# Patient Record
Sex: Female | Born: 1955 | Race: White | Hispanic: No | Smoking: Former smoker
Health system: Southern US, Community
[De-identification: ages and names within clinical notes are randomized; demographics above are authoritative.]

## PROBLEM LIST (undated history)

## (undated) VITALS — BP 96/73 | HR 140 | Temp 97.6°F | Resp 20 | Ht 64.0 in | Wt 192.0 lb

## (undated) DIAGNOSIS — F32A Depression, unspecified: Secondary | ICD-10-CM

## (undated) DIAGNOSIS — K589 Irritable bowel syndrome without diarrhea: Secondary | ICD-10-CM

## (undated) DIAGNOSIS — E7800 Pure hypercholesterolemia, unspecified: Secondary | ICD-10-CM

## (undated) DIAGNOSIS — K219 Gastro-esophageal reflux disease without esophagitis: Secondary | ICD-10-CM

## (undated) DIAGNOSIS — Z9889 Other specified postprocedural states: Secondary | ICD-10-CM

## (undated) DIAGNOSIS — R112 Nausea with vomiting, unspecified: Secondary | ICD-10-CM

## (undated) DIAGNOSIS — F329 Major depressive disorder, single episode, unspecified: Secondary | ICD-10-CM

## (undated) DIAGNOSIS — C50919 Malignant neoplasm of unspecified site of unspecified female breast: Secondary | ICD-10-CM

## (undated) DIAGNOSIS — F419 Anxiety disorder, unspecified: Secondary | ICD-10-CM

## (undated) DIAGNOSIS — I10 Essential (primary) hypertension: Secondary | ICD-10-CM

## (undated) DIAGNOSIS — K449 Diaphragmatic hernia without obstruction or gangrene: Secondary | ICD-10-CM

## (undated) DIAGNOSIS — C50911 Malignant neoplasm of unspecified site of right female breast: Secondary | ICD-10-CM

## (undated) HISTORY — PX: TONSILLECTOMY: SUR1361

## (undated) HISTORY — PX: ABDOMINAL HYSTERECTOMY: SHX81

## (undated) HISTORY — PX: MASTECTOMY: SHX3

## (undated) HISTORY — PX: BREAST SURGERY: SHX581

## (undated) HISTORY — PX: TUBAL LIGATION: SHX77

## (undated) HISTORY — PX: TOTAL ABDOMINAL HYSTERECTOMY W/ BILATERAL SALPINGOOPHORECTOMY: SHX83

## (undated) HISTORY — PX: CHOLECYSTECTOMY: SHX55

## (undated) HISTORY — PX: FOOT SURGERY: SHX648

---

## 2012-01-26 ENCOUNTER — Encounter: Payer: BC Managed Care – PPO | Admitting: Internal Medicine

## 2012-01-26 DIAGNOSIS — K589 Irritable bowel syndrome without diarrhea: Secondary | ICD-10-CM

## 2012-01-26 DIAGNOSIS — F411 Generalized anxiety disorder: Secondary | ICD-10-CM

## 2012-01-26 DIAGNOSIS — C50919 Malignant neoplasm of unspecified site of unspecified female breast: Secondary | ICD-10-CM

## 2012-02-03 ENCOUNTER — Encounter (INDEPENDENT_AMBULATORY_CARE_PROVIDER_SITE_OTHER): Payer: Self-pay | Admitting: *Deleted

## 2012-03-08 ENCOUNTER — Ambulatory Visit (INDEPENDENT_AMBULATORY_CARE_PROVIDER_SITE_OTHER): Payer: BC Managed Care – PPO | Admitting: Internal Medicine

## 2012-07-08 ENCOUNTER — Encounter: Payer: BC Managed Care – PPO | Admitting: Internal Medicine

## 2012-08-29 ENCOUNTER — Inpatient Hospital Stay (HOSPITAL_COMMUNITY)
Admission: RE | Admit: 2012-08-29 | Discharge: 2012-09-02 | DRG: 430 | Disposition: A | Discharge status: Home / Self Care | Payer: BC Managed Care – PPO | Attending: Psychiatry | Admitting: Psychiatry

## 2012-08-29 ENCOUNTER — Encounter (HOSPITAL_COMMUNITY): Payer: Self-pay | Admitting: *Deleted

## 2012-08-29 DIAGNOSIS — F329 Major depressive disorder, single episode, unspecified: Secondary | ICD-10-CM

## 2012-08-29 DIAGNOSIS — F419 Anxiety disorder, unspecified: Secondary | ICD-10-CM

## 2012-08-29 DIAGNOSIS — F32A Depression, unspecified: Secondary | ICD-10-CM | POA: Diagnosis present

## 2012-08-29 DIAGNOSIS — F411 Generalized anxiety disorder: Secondary | ICD-10-CM | POA: Diagnosis present

## 2012-08-29 DIAGNOSIS — F332 Major depressive disorder, recurrent severe without psychotic features: Principal | ICD-10-CM | POA: Diagnosis present

## 2012-08-29 DIAGNOSIS — I10 Essential (primary) hypertension: Secondary | ICD-10-CM | POA: Diagnosis present

## 2012-08-29 DIAGNOSIS — Z79899 Other long term (current) drug therapy: Secondary | ICD-10-CM

## 2012-08-29 HISTORY — DX: Irritable bowel syndrome, unspecified: K58.9

## 2012-08-29 HISTORY — DX: Anxiety disorder, unspecified: F41.9

## 2012-08-29 HISTORY — DX: Depression, unspecified: F32.A

## 2012-08-29 HISTORY — DX: Malignant neoplasm of unspecified site of unspecified female breast: C50.919

## 2012-08-29 HISTORY — DX: Essential (primary) hypertension: I10

## 2012-08-29 HISTORY — DX: Major depressive disorder, single episode, unspecified: F32.9

## 2012-08-29 HISTORY — DX: Gastro-esophageal reflux disease without esophagitis: K21.9

## 2012-08-29 HISTORY — DX: Diaphragmatic hernia without obstruction or gangrene: K44.9

## 2012-08-29 HISTORY — DX: Malignant neoplasm of unspecified site of right female breast: C50.911

## 2012-08-29 MED ORDER — ALUM & MAG HYDROXIDE-SIMETH 200-200-20 MG/5ML PO SUSP: 30.0000 mL | ORAL | Status: DC | PRN

## 2012-08-29 MED ORDER — EZETIMIBE 10 MG PO TABS
10.0000 mg | ORAL_TABLET | Freq: Every day | ORAL | Status: DC
  Administered 2012-08-30 – 2012-09-02 (×4): 10 mg via ORAL
  Filled 2012-08-29 (×7): qty 1

## 2012-08-29 MED ORDER — ALPRAZOLAM 1 MG PO TABS
1.0000 mg | ORAL_TABLET | Freq: Four times a day (QID) | ORAL | Status: DC
  Administered 2012-08-29 – 2012-09-02 (×14): 1 mg via ORAL
  Filled 2012-08-29 (×14): qty 1

## 2012-08-29 MED ORDER — AMLODIPINE BESYLATE 2.5 MG PO TABS
2.5000 mg | ORAL_TABLET | Freq: Every day | ORAL | Status: DC
  Filled 2012-08-29 (×2): qty 1

## 2012-08-29 MED ORDER — SERTRALINE HCL 100 MG PO TABS
200.0000 mg | ORAL_TABLET | Freq: Every day | ORAL | Status: DC
  Administered 2012-08-30 – 2012-09-02 (×4): 200 mg via ORAL
  Filled 2012-08-29 (×6): qty 2

## 2012-08-29 MED ORDER — MAGNESIUM HYDROXIDE 400 MG/5ML PO SUSP: 30.0000 mL | Freq: Every day | ORAL | Status: DC | PRN

## 2012-08-29 MED ORDER — GABAPENTIN 300 MG PO CAPS
300.0000 mg | ORAL_CAPSULE | Freq: Three times a day (TID) | ORAL | Status: DC
  Administered 2012-08-30 (×2): 300 mg via ORAL
  Filled 2012-08-29 (×18): qty 1

## 2012-08-29 MED ORDER — TRAZODONE HCL 50 MG PO TABS
50.0000 mg | ORAL_TABLET | Freq: Every evening | ORAL | Status: DC | PRN
  Administered 2012-08-29: 50 mg via ORAL
  Filled 2012-08-29: qty 1

## 2012-08-29 MED ORDER — BENAZEPRIL HCL 10 MG PO TABS
10.0000 mg | ORAL_TABLET | Freq: Every day | ORAL | Status: DC
  Filled 2012-08-29 (×2): qty 1

## 2012-08-29 MED ORDER — ANASTROZOLE 1 MG PO TABS
1.0000 mg | ORAL_TABLET | Freq: Every day | ORAL | Status: DC
  Administered 2012-08-30 – 2012-09-02 (×4): 1 mg via ORAL
  Filled 2012-08-29 (×6): qty 1

## 2012-08-29 MED ORDER — ACETAMINOPHEN 325 MG PO TABS
650.0000 mg | ORAL_TABLET | Freq: Four times a day (QID) | ORAL | Status: DC | PRN
  Administered 2012-09-01: 650 mg via ORAL

## 2012-08-29 NOTE — Progress Notes (Signed)
NSG Admission Note: 57 year old female who presents with SI with a plan to OD on pill, depression and anxiety.  Patient states it was not her intention to say that she was having suicidal thoughts.  Patient states she was overwhelmed with many family issues but states shdoes not want to kill herself.  Patient states she has battled depression and anxiety.  Patient states the bulk of her stress comes form her children.  Patient states she has a daughter is uses battles with substance abuse and patient states her daughter lost custody of her children.  Patient states she has a son who recently found out his wife is having an affair and patient's son is living with her.  Patient states her son is drinking alcohol and patient states she constantly worries about him fearful that he will take his life accidentally.  Patient currently denies SI.HI and denies AVH.  Patient states she wants  to go to group therapy with people like her who are having problems with their children.  Patient states her medical history is anxiety, depression, breast ca, and hypertension.  Surgical history: mastectomy with reconstruction.  Patient calm, cooperative and appropriate on the unit.  Consents and skin search done by [employer].

## 2012-08-29 NOTE — Tx Team (Signed)
Initial Interdisciplinary Treatment Plan  PATIENT STRENGTHS: (choose at least two) Ability for insight Average or above average intelligence Capable of independent living Communication skills General fund of knowledge Motivation for treatment/growth Supportive family/friends  PATIENT STRESSORS: Marital or family conflict   PROBLEM LIST: Problem List/Patient Goals Date to be addressed Date deferred Reason deferred Estimated date of resolution  Depression 08/29/2012     Anxiety 08/29/2012     Suicidal Thoughts 08/29/2012                                          DISCHARGE CRITERIA:  Ability to meet basic life and health needs Improved stabilization in mood, thinking, and/or behavior Medical problems require only outpatient monitoring Safe-care adequate arrangements made  PRELIMINARY DISCHARGE PLAN: Attend aftercare/continuing care group Participate in family therapy Return to previous living arrangement Return to previous work or school arrangements  PATIENT/FAMIILY INVOLVEMENT: This treatment plan has been presented to and reviewed with the patient, Claudia [name].  The patient and family have been given the opportunity to ask questions and make suggestions.  Angeline Slim M 08/29/2012, 8:07 PM
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
## 2012-08-30 DIAGNOSIS — F332 Major depressive disorder, recurrent severe without psychotic features: Principal | ICD-10-CM

## 2012-08-30 MED ORDER — BENAZEPRIL HCL 10 MG PO TABS
10.0000 mg | ORAL_TABLET | Freq: Every day | ORAL | Status: DC
  Administered 2012-08-30 – 2012-09-01 (×3): 10 mg via ORAL
  Filled 2012-08-30 (×5): qty 1

## 2012-08-30 MED ORDER — AMLODIPINE BESYLATE 2.5 MG PO TABS
2.5000 mg | ORAL_TABLET | Freq: Every day | ORAL | Status: DC
  Administered 2012-08-30: 2.5 mg via ORAL
  Administered 2012-08-31 – 2012-09-01 (×2): via ORAL
  Filled 2012-08-30 (×5): qty 1

## 2012-08-30 MED ORDER — ALPRAZOLAM 1 MG PO TABS
1.0000 mg | ORAL_TABLET | Freq: Once | ORAL | Status: AC
  Administered 2012-08-30: 1 mg via ORAL
  Filled 2012-08-30: qty 1

## 2012-08-30 NOTE — Progress Notes (Signed)
Adult Psychoeducational Group Note  Date:  08/30/2012 Time:  5:24 PM  Group Topic/Focus:  Recovery Goals:   The focus of this group is to identify appropriate goals for recovery and establish a plan to achieve them.  Participation Level:  Minimal  Participation Quality:  Attentive  Affect:  Appropriate  Cognitive:  Appropriate  Insight: Good  Engagement in Group:  Developing/Improving  Modes of Intervention:  Education, Dentist and Support  Additional Comments:  Claudia [name] attended group, and participated. She gave positive feedback with her situation. Patient discussed in group the definition of recovery. She completed workbook on writing two changes that were needed to be made in her life and how she could change them to stay on path for recovery. Patient explained her goals for recovery. Patient stated she wanted to focus on caring more for her needs and then her children. Claudia [name] stated she felt like she gives more time to them to than herself, always there for them when needed.   Karleen Hampshire Brittini 08/30/2012, 5:24 PM

## 2012-08-30 NOTE — Progress Notes (Signed)
D: Patient calm and cooperative.  Patient states she feels depressed.  Patient visible on the unit not interacting with peers.  Patient states she feels anxiety.  Patient denies SI/HI and patient denies AVH. A: Staff to monitor Q 15 mins for safety.  Encouragement and support offered.  Scheduled medications administered per orders.  Patient refused Neurontin because patient states she wants to talk to her doctor before taking it.  Patient refused Zetia because patient states she had already taken her daily dose for tonight.  Trazodone administered prn for sleep. R: Patient remains safe on the unit.  Patient went into group late because her admission had to be finished.  Patient taking administered medications.

## 2012-08-30 NOTE — Progress Notes (Signed)
Patient ID: Claudia [name], female   DOB: 1955-09-21, 57 y.o.   MRN: [id number] D- Patient reports poor sleep and good appetite.  Her energy level is normal and her ability to pay attention is good.  She rates her depression and her anxiety at 4.  She denies thougths of self harm.  She says she needs to "try to separate my happiness from my children's happiness."  A- Encouraged pt to take advantage of opportunity to take care of herself and focus on herself while here.  R- Patient says she will.

## 2012-08-30 NOTE — Progress Notes (Signed)
D - Patient active on the unit today, interacting with peers appropriately. Patient stated that the trazodone made her feel "drugged, dizzy, and weird feeling" and does not want to take it anymore. Patient stated she would prefer to take 2 mg Xanax at night. Patient refused neurontin today stating she is concerned it may be making her feel weird.  A - A - Patient offered encouragement and support through therapeutic conversation. Encouraged patient to speak with staff about any concerns or questions. Medications given as ordered.  R - Patient safety maintained with Q 15 minute checks. Patient remains safe on the unit.

## 2012-08-30 NOTE — Progress Notes (Signed)
Grief and Loss Group  Group discussed significant losses in life and reactions to losses.  Group also explored emotions, coping skills for loss, and limits of control.  Pt discussed the death of her uncle and processing loss through expression of emotions and the use of humor.  Pt discussed taking on the role of a caretaker and recognizing that pt must care for herself as opposed to feeling the pressure to care for others at her expense.  Whitney P Akers Counseling Intern [employer]
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
## 2012-08-30 NOTE — Progress Notes (Signed)
Kellie Shropshire, PA notified of patient request for 2 mg xanax tonight instead of taking trazodone, new order received.
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
## 2012-08-30 NOTE — Progress Notes (Signed)
[facility] LCSW Aftercare Discharge Planning Group Note  08/30/2012 10:52 AM  Participation Quality:  Appropriate and Attentive  Affect:  Appropriate  Cognitive:  Alert and Appropriate  Insight:  Engaged  Engagement in Group:  Engaged  Modes of Intervention:  Exploration, Problem-solving, Rapport Building and Support  Summary of Progress/Problems:  Patient advised of admitting to the hospital due to concerns regarding her adult children.  Patient shared she made a suicidal statement but had no intent of acting on thought.  She denies SI/HI today and rates depression and anxiety at four. She reports having home and outpatient therapy.  She is followed for medications by her PCP.     Wynn Banker 08/30/2012, 10:52 AM

## 2012-08-30 NOTE — [facility] Suicide Risk Assessment (Signed)
Suicide Risk Assessment  Admission Assessment     Nursing information obtained from:  Patient Demographic factors:  Caucasian;Divorced or widowed Current Mental Status:  Suicidal ideation indicated by patient Loss Factors:  NA Historical Factors:  Prior suicide attempts (at 57 year old) Risk Reduction Factors:  Sense of responsibility to family;Positive social support;Positive therapeutic relationship;Religious beliefs about death;Employed  CLINICAL FACTORS:   Severe Anxiety and/or Agitation Depression:   Anhedonia Hopelessness Insomnia Severe  COGNITIVE FEATURES THAT CONTRIBUTE TO RISK:  Cognitively intact  SUICIDE RISK:   Mild:  Suicidal ideation of limited frequency, intensity, duration, and specificity.  There are no identifiable plans, no associated intent, mild dysphoria and related symptoms, good self-control (both objective and subjective assessment), few other risk factors, and identifiable protective factors, including available and accessible social support.  PLAN OF CARE: Start medications as needed. Plan for discharge when stable.  I certify that inpatient services furnished can reasonably be expected to improve the patient's condition.  Kasidi Shanker 08/30/2012, 10:42 AM

## 2012-08-30 NOTE — Progress Notes (Signed)
[facility] LCSW Group Therapy      Feelings about Diagnosis 1:15 - 2:30    08/30/2012 3:27 PM  Type of Therapy:  Group Therapy  Participation Level:  Active  Participation Quality:  Appropriate and Attentive  Affect:  Appropriate  Cognitive:  Appropriate  Insight:  Engaged  Engagement in Therapy:  Engaged  Modes of Intervention:  Discussion, Exploration, Orientation, Rapport Building and Support  Summary of Progress/Problems:  Patient shared she struggles not with a diagnosis but with condoning her children even when she knows they are wrong.  Patient stated she is obsessed with children and does not know how to separate her life from theirs.  Patient was informed when she supports her children in negative behaviors she becomes an Scientist, forensic and that does not benefit her or her children.  Wynn Banker 08/30/2012, 3:27 PM

## 2012-08-31 DIAGNOSIS — F32A Depression, unspecified: Secondary | ICD-10-CM | POA: Diagnosis present

## 2012-08-31 DIAGNOSIS — F329 Major depressive disorder, single episode, unspecified: Secondary | ICD-10-CM | POA: Diagnosis present

## 2012-08-31 DIAGNOSIS — F419 Anxiety disorder, unspecified: Secondary | ICD-10-CM | POA: Diagnosis present

## 2012-08-31 MED ORDER — ALPRAZOLAM 1 MG PO TABS
2.0000 mg | ORAL_TABLET | Freq: Once | ORAL | Status: AC
  Administered 2012-08-31: 2 mg via ORAL
  Filled 2012-08-31: qty 2

## 2012-08-31 NOTE — Progress Notes (Signed)
[facility] LCSW Aftercare Discharge Planning Group Note  08/31/2012 12:43 PM  Participation Quality:  Appropriate and Attentive  Affect:  Appropriate  Cognitive:  Alert and Appropriate  Insight:  Engaged  Engagement in Group:  Engaged  Modes of Intervention:  Exploration, Problem-solving, Rapport Building and Support  Summary of Progress/Problems:  Patient reports being better today and rates symptoms at two/three.  Patient continued to be focused on adult son.  Writer redirected patient to focus on herself and her needs. Daily workbook provided.  Wynn Banker 08/31/2012, 12:43 PM

## 2012-08-31 NOTE — Progress Notes (Signed)
Adult Psychoeducational Group Note  Date:  08/31/2012 Time:  3:00 PM  Group Topic/Focus:  Personal Choices and Values:   The focus of this group is to help patients assess and explore the importance of values in their lives, how their values affect their decisions, how they express their values and what opposes their expression.  Participation Level:  Minimal  Participation Quality:  Appropriate and Attentive  Affect:  Appropriate  Cognitive:  Appropriate  Insight: Good and Improving  Engagement in Group:  Developing/Improving  Modes of Intervention:  Discussion, Education, Limit-setting, Problem-solving and Support  Additional Comments:  Amberley attended group and shared when asked to speak. Patient was drowsy during group. Patient did stated what were some of her negative and positive choices she has made throughout her life span. Patient also completed her worksheets in her workbook on identifying values and choosing a value oriented life.   Karleen Hampshire Brittini 08/31/2012, 3:00 PM

## 2012-08-31 NOTE — Tx Team (Signed)
Interdisciplinary Treatment Plan Update (Adult)  Date:  08/31/2012  Time Reviewed:  10:36 AM   Progress in Treatment: Attending groups:   Yes   Participating in groups:  Yes Taking medication as prescribed:  Yes Tolerating medication:  Yes Family/Significant othe contact made: Contact to be made with family Patient understands diagnosis:  Yes Discussing patient identified problems/goals with staff: Yes Medical problems stabilized or resolved: Yes Denies suicidal/homicidal ideation:Yes Issues/concerns per patient self-inventory:  Other:   New problem(s) identified:  Reason for Continuation of Hospitalization: Anxiety Depression Medication stabilization   Interventions implemented related to continuation of hospitalization:  Medication Management; safety checks q 15 mins  Additional comments:  Estimated length of stay:  2-3 days  Discharge Plan:  Home with outpatient follow up  New goal(s):  Review of initial/current patient goals per problem list:    1.  Goal(s): Eliminate SI/other thoughts of self harm (Patient will no longer endorse SI/HI or thoughts of self harm)   Met:  Yes  Target date: d/c  As evidenced by: Patient no longer endorses SI/HI or other thoughts of self harm.    2.  Goal (s):Reduce depression/anxiety (Paitent will rate symptoms at four or below)  Met: Yes  Target date: d/c  As evidenced by: Patient rates symptoms at two/three today    3.  Goal(s):.stabilize on meds (Patient will report being stabilized on medications - less symptomatic   Met:  No  Target date: d/c  As evidenced by: Patient reports being better today    4.  Goal(s): Refer for outpatient follow up (Follow up appointment will be scheduled)   Met:  Yes  Target date: d/c  As evidenced by: Follow up appointment not scheduled    Attendees: Patient:   08/31/2012 10:36 AM  Physican:  Patrick North, MD 08/31/2012 10:36 AM  Nursing:    Chinita Greenland, RN 08/31/2012  10:36 AM   Nursing:    Harold Barban, RN 08/31/2012 10:36 AM   Clinical Social Worker:  Juline Patch, LCSW 08/31/2012 10:36 AM   Other: Tera Helper, PHM-NP 08/31/2012 10:36 AM   Other:   08/31/2012 10:36 AM Other:        08/31/2012 10:36 AM

## 2012-08-31 NOTE — Progress Notes (Signed)
[facility] Group Notes:  (Nursing/MHT/Case Management/Adjunct)  Type of Therapy:  Psychoeducational Skills  Participation Level:  Active  Participation Quality:  Appropriate, Attentive and Sharing  Affect:  Blunted  Cognitive:  Appropriate and Oriented  Insight:  Good  Engagement in Group:  Engaged  Modes of Intervention:  Activity, Discussion, Education, Problem-solving, Rapport Building, Socialization and Support  Summary of Progress/Problems: Claudia [name] attended psychoeducational group on labels. 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 Claudia [name] was quiet but attentive and spoke when called on while group discussed what labels are, how we use them, how they affect the way we think about and perceive the world, and listed positive and negative labels they have used or been called. Claudia [name] was given homework assignment to list 10 words he has been labeled and to find the reality of the situation/label.    Wandra Scot 08/31/2012 12:47 PM

## 2012-08-31 NOTE — Progress Notes (Signed)
Patient ID: Claudia [name], female   DOB: [birth year], 56 y.o.   MRN: [id number] Patient currently asleep; no s/s of distress noted at this time.

## 2012-08-31 NOTE — Progress Notes (Signed)
Nutrition Brief Note  Pt meets criteria for severe MALNUTRITION in the context of social/environmental circumstances as evidenced by <75% estimated energy intake with 13.5% weight loss in the past 5 months per pt report.  Patient identified on the Malnutrition Screening Tool (MST) Report  Body mass index is 32.96 kg/(m^2). Patient meets criteria for class I obesity based on current BMI.   Pt reports 30 pound unintended weight loss in the past 5 months from stress, depression, and anxiety. Pt reports she works 3rd shift and her daily meal intake may consist of a cookie or a sandwich or she would skip meals or days and not eat. Pt reports her intake severely declined in the past few weeks. Pt reports her appetite is currently excellent and denies needing any nutritional supplements at this time. Encouraged pt to continue to eat well at discharge.   No nutrition interventions warranted at this time. If nutrition issues arise, please consult RD.   Levon Hedger MS, RD, LDN [phone] Pager [phone] After Hours Pager

## 2012-08-31 NOTE — Progress Notes (Signed)
[facility] LCSW Group Therapy      Feelings about Diagnosis 1:15 - 2:30    08/31/2012 12:45 PM  Type of Therapy:  Group Therapy  Participation Level:  Active  Participation Quality:  Appropriate and Attentive  Affect:  Appropriate  Cognitive:  Appropriate  Insight:  Engaged  Engagement in Therapy:  Engaged  Modes of Intervention:  Discussion, Exploration, Orientation, Rapport Building and Support  Summary of Progress/Problems:  Patient shared the exercise on mindfulness was very helpful.  She stated she does not know how to apply it to work in her problem with relationship with her children.  Patient was encourage to insist her daughter respects her when they talk by ending conversation if daughter is cursing her.    Wynn Banker 08/31/2012, 12:45 PM

## 2012-08-31 NOTE — Progress Notes (Signed)
[facility] MD Progress Note  08/31/2012 3:43 PM Claudia [name]  MRN:  [id number] Subjective:  Claudia [name] reports "good" sleep but feels "sluggish" today, appetite "extremely good", 2/10 depression and anxiety, denies suicidal ideations, arthritis aches and pains in her lower back and knees but reports it is controlled, pleasant and engaging. Diagnosis:   Axis I: Anxiety Disorder NOS and Depressive Disorder NOS Axis II: Deferred Axis III:  Past Medical History  Diagnosis Date  . Anxiety   . Depression   . Hypertension   . Breast cancer   . GERD (gastroesophageal reflux disease)   . Cancer of right breast     mastectomy  . Irritable bowel   . Hiatal hernia    Axis IV: other psychosocial or environmental problems, problems related to social environment and problems with primary support group Axis V: 41-50 serious symptoms  ADL's:  Intact  Sleep: Good  Appetite:  Good  Suicidal Ideation:  Denies Homicidal Ideation:  Denies  Psychiatric Specialty Exam: Review of Systems  Constitutional: Negative.   Eyes: Negative.   Respiratory: Negative.   Cardiovascular: Negative.   Gastrointestinal: Negative.   Genitourinary: Negative.   Musculoskeletal: Negative.   Skin: Negative.   Neurological: Negative.   Endo/Heme/Allergies: Negative.   Psychiatric/Behavioral: Positive for depression. The patient is nervous/anxious.     Blood pressure 122/72, pulse 93, temperature 97.8 F (36.6 C), temperature source Oral, resp. rate 18, height 5\' 4"  (1.626 m), weight 87.091 kg (192 lb), SpO2 98.00%.Body mass index is 32.96 kg/(m^2).  General Appearance: Casual  Eye Contact::  Fair  Speech:  Normal Rate  Volume:  Normal  Mood:  Anxious and Depressed  Affect:  Congruent  Thought Process:  Coherent  Orientation:  Full (Time, Place, and Person)  Thought Content:  WDL  Suicidal Thoughts:  No  Homicidal Thoughts:  No  Memory:  Immediate;   Fair Recent;   Fair Remote;   Fair  Judgement:  Fair   Insight:  Fair  Psychomotor Activity:  Decreased  Concentration:  Fair  Recall:  Fair  Akathisia:  No  Handed:  Right  AIMS (if indicated):     Assets:  Communication Skills Desire for Improvement Resilience  Sleep:  Number of Hours: 5    Current Medications: Current Facility-Administered Medications  Medication Dose Route Frequency Provider Last Rate Last Dose  . acetaminophen (TYLENOL) tablet 650 mg  650 mg Oral Q6H PRN Rachael Fee, MD      . ALPRAZolam Prudy Feeler) tablet 1 mg  1 mg Oral QID Rachael Fee, MD   1 mg at 08/31/12 1213  . alum & mag hydroxide-simeth (MAALOX/MYLANTA) 200-200-20 MG/5ML suspension 30 mL  30 mL Oral Q4H PRN Rachael Fee, MD      . amLODipine (NORVASC) tablet 2.5 mg  2.5 mg Oral Daily Nanine Means, NP   2.5 mg at 08/30/12 2144  . anastrozole (ARIMIDEX) tablet 1 mg  1 mg Oral Daily Rachael Fee, MD   1 mg at 08/31/12 0815  . benazepril (LOTENSIN) tablet 10 mg  10 mg Oral Daily Nanine Means, NP   10 mg at 08/30/12 2144  . ezetimibe (ZETIA) tablet 10 mg  10 mg Oral Daily Rachael Fee, MD   10 mg at 08/31/12 4098  . gabapentin (NEURONTIN) capsule 300 mg  300 mg Oral TID Rachael Fee, MD   300 mg at 08/30/12 1151  . magnesium hydroxide (MILK OF MAGNESIA) suspension 30 mL  30 mL Oral Daily  PRN Rachael Fee, MD      . sertraline (ZOLOFT) tablet 200 mg  200 mg Oral Daily Rachael Fee, MD   200 mg at 08/31/12 1610  . traZODone (DESYREL) tablet 50 mg  50 mg Oral QHS PRN,MR X 1 Verne Spurr, PA-C   50 mg at 08/29/12 2256    Lab Results: No results found for this or any previous visit (from the past 48 hour(s)).  Physical Findings: AIMS: Facial and Oral Movements Muscles of Facial Expression: None, normal Lips and Perioral Area: None, normal Jaw: None, normal Tongue: None, normal,Extremity Movements Upper (arms, wrists, hands, fingers): None, normal Lower (legs, knees, ankles, toes): None, normal, Trunk Movements Neck, shoulders, hips: None, normal,  Overall Severity Severity of abnormal movements (highest score from questions above): None, normal Incapacitation due to abnormal movements: None, normal Patient's awareness of abnormal movements (rate only patient's report): No Awareness, Dental Status Current problems with teeth and/or dentures?: No Does patient usually wear dentures?: No  CIWA:    COWS:     Treatment Plan Summary: Daily contact with patient to assess and evaluate symptoms and progress in treatment Medication management  Plan:  Review of chart, vital signs, medications, and notes. 1-Individual and group therapy encouraged 2-Medication management for depression and anxiety:  Medications reviewed with the patient and she stated no adverse effects 3-Coping skills for depression, anxiety, and pain 4-Continue crisis stabilization and management 5-Address health issues--monitoring pain, stable 6-Treatment plan in progress to prevent relapse of depression and anxiety   Medical Decision Making Problem Points:  Established problem, stable/improving (1) and Review of psycho-social stressors (1) Data Points:  Review of medication regiment & side effects (2)  I certify that inpatient services furnished can reasonably be expected to improve the patient's condition.   Nanine Means, PMH-NP 08/31/2012, 3:43 PM

## 2012-08-31 NOTE — Progress Notes (Signed)
D: Patient appropriate and cooperative with staff and peers. Patient's affect/mood is depressed and anxious. Also at times the patient is sad, but brightens on approach. She reported on self inventory sheet that her energy level is normal and ability to pay attention is improving.  A: Support and encouragement provided to patient. Administered scheduled medications per ordering MD. Maintain Q15 minute checks for safety.  R: Patient receptive. Denies SI/HI/AVH. Patient remains safe.

## 2012-09-01 MED ORDER — QUETIAPINE FUMARATE 50 MG PO TABS
50.0000 mg | ORAL_TABLET | Freq: Once | ORAL | Status: AC
  Administered 2012-09-02: 50 mg via ORAL
  Filled 2012-09-01 (×2): qty 1

## 2012-09-01 NOTE — Progress Notes (Signed)
Reviewed

## 2012-09-01 NOTE — Progress Notes (Signed)
Adult Psychoeducational Group Note  Date:  09/01/2012 Time:  4:36 PM  Group Topic/Focus:  Rediscovering Joy:   The focus of this group is to explore various ways to relieve stress in a positive manner.  Participation Level:  Active  Participation Quality:  Appropriate and Drowsy  Affect:  Appropriate  Cognitive:  Appropriate  Insight: Appropriate  Engagement in Group:  Developing/Improving and Improving  Modes of Intervention:  Activity, Discussion and Socialization  Additional Comments:  Claudia [name] attended group and participated. Group discussion consisted of talking about how to rediscover joy. Patient stated ways that laughter and humor affect the human mind and body. Patient then expressed ways to have humor and laughter apart of their lives. Activity was apart of group, patient explained one thing enjoyable related to the five senses and how to incorporate into daily coping skills. Claudia [name] was drowsy at times and spoke when asked to speak. Claudia [name] was drowsy at times during group, but active within the group when asked to share.     Karleen Hampshire Brittini 09/01/2012, 4:36 PM

## 2012-09-01 NOTE — Progress Notes (Signed)
[facility] LCSW Aftercare Discharge Planning Group Note  09/01/2012 4:31 PM  Participation Quality:  Appropriate and Attentive  Affect:  Appropriate  Cognitive:  Alert and Appropriate  Insight:  Engaged  Engagement in Group:  Engaged  Modes of Intervention:  Exploration, Problem-solving, Rapport Building and Support  Summary of Progress/Problems:  Patient reports being better today and denies SI/HI.  She rates symptoms at two/three.  Patient shared she is hopeful to discharge home tomorrow.  She asked that Engineer, building services employer to notify of hospitalization.  Daily workbook provided.  Claudia [name] 09/01/2012, 4:31 PM

## 2012-09-01 NOTE — Progress Notes (Signed)
Patient ID: Claudia [name], female   DOB: 1955/10/29, 57 y.o.   MRN: [id number] D: Patient in room on approach. Pt presented with depressed mood and flat affect. Pt stated her love for her children is why she is depressed. Pt stated she only called her children once each tonight.  Pt is aware she need to take care of herself first. Pt denies SI/HI/AVH and pain. Pt attended evening wrap up group and Interacted appropriately with peers. Cooperative with assessment. No acute distressed noted at this time.   A: Met with pt 1:1. Medications administered as prescribed. Writer encouraged pt to discuss feelings. Pt encouraged to come to staff with any question or concerns.  R: Patient remains safe. She is complaint with medications and group programming. Continue current POC.

## 2012-09-01 NOTE — Progress Notes (Signed)
[facility] LCSW Group Therapy  [facility] 1:15 - 2:30    09/01/2012 4:32 PM  Type of Therapy:  Group Therapy  Participation Level:  Minimal  Participation Quality:  Appropriate and Attentive  Affect:  Appropriate  Cognitive:  Appropriate  Insight:  Engaged  Engagement in Therapy:  Engaged  Modes of Intervention:  Discussion, Exploration, Orientation, Rapport Building and Support  Summary of Progress/Problems: Patient listened attentively to speaker from [employer].  She did not make any comments on the presentation.  Wynn Banker 09/01/2012, 4:32 PM

## 2012-09-01 NOTE — Progress Notes (Signed)
[facility] INPATIENT:  Family/Significant Other Suicide Prevention Education  Suicide Prevention Education:  Education Completed; Claudia [name], Boyfriend, [phone] has been identified by the patient as the family member/significant other with whom the patient will be residing, and identified as the person(s) who will aid the patient in the event of a mental health crisis (suicidal ideations/suicide attempt).  With written consent from the patient, the family member/significant other has been provided the following suicide prevention education, prior to the and/or following the discharge of the patient.  The suicide prevention education provided includes the following:  Suicide risk factors  Suicide prevention and interventions  National Suicide Hotline telephone number  [facility] assessment telephone number  [facility] Emergency Assistance 911  [facility] and/or Residential Mobile Crisis Unit telephone number  Request made of family/significant other to:  Remove weapons (e.g., guns, rifles, knives), all items previously/currently identified as safety concern.  Boyfriend advised there are no guns in the home.  Remove drugs/medications (over-the-counter, prescriptions, illicit drugs), all items previously/currently identified as a safety concern.  The family member/significant other verbalizes understanding of the suicide prevention education information provided.  The family member/significant other agrees to remove the items of safety concern listed above.  Claudia [name] 09/01/2012, 5:18 PM

## 2012-09-01 NOTE — Progress Notes (Signed)
D:  Patient up and active in the milieu today.  She has been attending groups and taking all prescribed medications.  Denies depressive symptoms, hopelessness, or anxiety.  Denies thoughts of suicide or self harm.  States she is looking forward to discharge tomorrow.   A:  Patient encouraged to continue to attend and participate in groups.  Medications given as ordered.  Gabapentin held per patient request.   R:  Pleasant and cooperative.  Interacting well with staff and peers.  Preparing for discharge tomorrow.  Patient is safe on the unit at this time.

## 2012-09-01 NOTE — Progress Notes (Signed)
[facility] Group Notes:  (Nursing/MHT/Case Management/Adjunct)  Date:  09/01/2012  Time:  1:37 AM  Type of Therapy:  wrap up group  Participation Level:  Active  Participation Quality:  Appropriate and Attentive  Affect:  Appropriate  Cognitive:  Appropriate  Insight:  Good  Engagement in Group:  Engaged  Modes of Intervention:  Discussion and Support  Summary of Progress/Problems: Pt stated that she had a good day today and feels much better than on admission.  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  Tomi Bamberger Coursey 09/01/2012, 1:37 AM

## 2012-09-01 NOTE — Progress Notes (Signed)
Patient ID: Claudia [name], female   DOB: [birth year], 57 y.o.   MRN: [id number] D: Pt affect/mood is flat, anxious and depressed. Pt stated she needs to exercise tough love with her son in order to help herself. Pt stated treatment team was an eye opening experience because it helped her see how she is enabling his son and in the end not helping anyone. Pt anxious at bedtime because her medications was not increased. Pt was asked by writer last night to talk to psychiatrist about her medication.  Pt denies SI/HI/AVH and pain. Pt attended evening karaoke. Cooperative with assessment. No acute distressed noted at this time.   A: Met with pt 1:1. Medications administered as prescribed. Writer encouraged pt to discuss feelings. Pt encouraged to come to staff with any question or concerns.  R: Patient remains safe. She is complaint with medications and group programming. Continue current POC.

## 2012-09-01 NOTE — Progress Notes (Deleted)
[facility] MD Progress Note  09/01/2012 11:10 AM Claudia [name]  MRN:  [id number] Subjective:  Patient continues to endorse significant anxiety. Able to participate in groups. Concerned about her follow up plans and homeless status.  Diagnosis:   Axis I: Anxiety Disorder NOS and Depressive Disorder NOS Axis II: Deferred Axis III:  Past Medical History  Diagnosis Date  . Anxiety   . Depression   . Hypertension   . Breast cancer   . GERD (gastroesophageal reflux disease)   . Cancer of right breast     mastectomy  . Irritable bowel   . Hiatal hernia    Axis IV: economic problems, housing problems, occupational problems and other psychosocial or environmental problems Axis V: 51-60 moderate symptoms  ADL's:  Intact  Sleep: Fair  Appetite:  Fair   Psychiatric Specialty Exam: Review of Systems  Constitutional: Negative.   HENT: Negative.   Eyes: Negative.   Respiratory: Negative.   Cardiovascular: Negative.   Gastrointestinal: Negative.   Genitourinary: Negative.   Musculoskeletal: Negative.   Skin: Negative.   Neurological: Negative.   Endo/Heme/Allergies: Negative.   Psychiatric/Behavioral: Positive for depression. The patient is nervous/anxious.     Blood pressure 105/77, pulse 83, temperature 98 F (36.7 C), temperature source Oral, resp. rate 18, height 5\' 4"  (1.626 m), weight 87.091 kg (192 lb), SpO2 98.00%.Body mass index is 32.96 kg/(m^2).  General Appearance: Casual  Eye Contact::  Fair  Speech:  Slow  Volume:  Decreased  Mood:  Anxious, Depressed and Dysphoric  Affect:  Constricted  Thought Process:  Circumstantial  Orientation:  Full (Time, Place, and Person)  Thought Content:  Very focused on medications  Suicidal Thoughts:  No  Homicidal Thoughts:  No  Memory:  Immediate;   Fair Recent;   Fair Remote;   Fair  Judgement:  Intact  Insight:  Present  Psychomotor Activity:  Normal  Concentration:  Fair  Recall:  Fair  Akathisia:  No  Handed:  Right   AIMS (if indicated):     Assets:  Communication Skills Desire for Improvement  Sleep:  Number of Hours: 4.5    Current Medications: Current Facility-Administered Medications  Medication Dose Route Frequency Provider Last Rate Last Dose  . acetaminophen (TYLENOL) tablet 650 mg  650 mg Oral Q6H PRN Rachael Fee, MD      . ALPRAZolam Prudy Feeler) tablet 1 mg  1 mg Oral QID Rachael Fee, MD   1 mg at 09/01/12 0747  . alum & mag hydroxide-simeth (MAALOX/MYLANTA) 200-200-20 MG/5ML suspension 30 mL  30 mL Oral Q4H PRN Rachael Fee, MD      . amLODipine (NORVASC) tablet 2.5 mg  2.5 mg Oral Daily Nanine Means, NP      . anastrozole (ARIMIDEX) tablet 1 mg  1 mg Oral Daily Rachael Fee, MD   1 mg at 09/01/12 0747  . benazepril (LOTENSIN) tablet 10 mg  10 mg Oral Daily Nanine Means, NP   10 mg at 08/31/12 2017  . ezetimibe (ZETIA) tablet 10 mg  10 mg Oral Daily Rachael Fee, MD   10 mg at 09/01/12 0747  . gabapentin (NEURONTIN) capsule 300 mg  300 mg Oral TID Rachael Fee, MD   300 mg at 08/30/12 1151  . magnesium hydroxide (MILK OF MAGNESIA) suspension 30 mL  30 mL Oral Daily PRN Rachael Fee, MD      . sertraline (ZOLOFT) tablet 200 mg  200 mg Oral Daily Rachael Fee,  MD   200 mg at 09/01/12 0747  . traZODone (DESYREL) tablet 50 mg  50 mg Oral QHS PRN,MR X 1 Verne Spurr, PA-C   50 mg at 08/29/12 2256    Lab Results: No results found for this or any previous visit (from the past 48 hour(s)).  Physical Findings: AIMS: Facial and Oral Movements Muscles of Facial Expression: None, normal Lips and Perioral Area: None, normal Jaw: None, normal Tongue: None, normal,Extremity Movements Upper (arms, wrists, hands, fingers): None, normal Lower (legs, knees, ankles, toes): None, normal, Trunk Movements Neck, shoulders, hips: None, normal, Overall Severity Severity of abnormal movements (highest score from questions above): None, normal Incapacitation due to abnormal movements: None,  normal Patient's awareness of abnormal movements (rate only patient's report): No Awareness, Dental Status Current problems with teeth and/or dentures?: No Does patient usually wear dentures?: No  CIWA:    COWS:     Treatment Plan Summary: Daily contact with patient to assess and evaluate symptoms and progress in treatment Medication management  Plan: Continue current plan of care. Plan for discharge.  Medical Decision Making Problem Points:  Established problem, stable/improving (1), Review of last therapy session (1) and Review of psycho-social stressors (1) Data Points:  Review of medication regiment & side effects (2)  I certify that inpatient services furnished can reasonably be expected to improve the patient's condition.   Joyel Chenette 09/01/2012, 11:10 AM

## 2012-09-01 NOTE — Progress Notes (Signed)
[facility] MD Progress Note  09/01/2012 1:10 PM Claudia [name]  MRN:  [id number] Subjective:  Patient reports she is doing better, tolerating her medications well. She reports she has learned skills to cope with her children and their problems, feeling like she will do better in the future.  Diagnosis:   Axis I: Depressive Disorder NOS Axis II: Deferred Axis III:  Past Medical History  Diagnosis Date  . Anxiety   . Depression   . Hypertension   . Breast cancer   . GERD (gastroesophageal reflux disease)   . Cancer of right breast     mastectomy  . Irritable bowel   . Hiatal hernia    Axis IV: other psychosocial or environmental problems Axis V: 51-60 moderate symptoms  ADL's:  Intact  Sleep: Fair  Appetite:  Fair  Psychiatric Specialty Exam: Review of Systems  Constitutional: Negative.   HENT: Negative.   Eyes: Negative.   Respiratory: Negative.   Cardiovascular: Negative.   Gastrointestinal: Negative.   Genitourinary: Negative.   Musculoskeletal: Negative.   Skin: Negative.   Neurological: Negative.   Endo/Heme/Allergies: Negative.   Psychiatric/Behavioral: Positive for depression. The patient is nervous/anxious.     Blood pressure 105/77, pulse 83, temperature 98 F (36.7 C), temperature source Oral, resp. rate 18, height 5\' 4"  (1.626 m), weight 87.091 kg (192 lb), SpO2 98.00%.Body mass index is 32.96 kg/(m^2).  General Appearance: Casual  Eye Contact::  Fair  Speech:  Clear and Coherent  Volume:  Decreased  Mood:  Anxious  Affect:  Congruent  Thought Process:  Coherent  Orientation:  Full (Time, Place, and Person)  Thought Content:  WDL  Suicidal Thoughts:  No  Homicidal Thoughts:  No  Memory:  Immediate;   Fair Recent;   Fair Remote;   Fair  Judgement:  Fair  Insight:  Fair  Psychomotor Activity:  Normal  Concentration:  Fair  Recall:  Fair  Akathisia:  No  Handed:  Right  AIMS (if indicated):     Assets:  Communication Skills Desire for  Improvement Housing Social Support  Sleep:  Number of Hours: 4.5    Current Medications: Current Facility-Administered Medications  Medication Dose Route Frequency Provider Last Rate Last Dose  . acetaminophen (TYLENOL) tablet 650 mg  650 mg Oral Q6H PRN Rachael Fee, MD      . ALPRAZolam Prudy Feeler) tablet 1 mg  1 mg Oral QID Rachael Fee, MD   1 mg at 09/01/12 1156  . alum & mag hydroxide-simeth (MAALOX/MYLANTA) 200-200-20 MG/5ML suspension 30 mL  30 mL Oral Q4H PRN Rachael Fee, MD      . amLODipine (NORVASC) tablet 2.5 mg  2.5 mg Oral Daily Nanine Means, NP      . anastrozole (ARIMIDEX) tablet 1 mg  1 mg Oral Daily Rachael Fee, MD   1 mg at 09/01/12 0747  . benazepril (LOTENSIN) tablet 10 mg  10 mg Oral Daily Nanine Means, NP   10 mg at 08/31/12 2017  . ezetimibe (ZETIA) tablet 10 mg  10 mg Oral Daily Rachael Fee, MD   10 mg at 09/01/12 0747  . gabapentin (NEURONTIN) capsule 300 mg  300 mg Oral TID Rachael Fee, MD   300 mg at 08/30/12 1151  . magnesium hydroxide (MILK OF MAGNESIA) suspension 30 mL  30 mL Oral Daily PRN Rachael Fee, MD      . sertraline (ZOLOFT) tablet 200 mg  200 mg Oral Daily Rachael Fee,  MD   200 mg at 09/01/12 0747  . traZODone (DESYREL) tablet 50 mg  50 mg Oral QHS PRN,MR X 1 Verne Spurr, PA-C   50 mg at 08/29/12 2256    Lab Results: No results found for this or any previous visit (from the past 48 hour(s)).  Physical Findings: AIMS: Facial and Oral Movements Muscles of Facial Expression: None, normal Lips and Perioral Area: None, normal Jaw: None, normal Tongue: None, normal,Extremity Movements Upper (arms, wrists, hands, fingers): None, normal Lower (legs, knees, ankles, toes): None, normal, Trunk Movements Neck, shoulders, hips: None, normal, Overall Severity Severity of abnormal movements (highest score from questions above): None, normal Incapacitation due to abnormal movements: None, normal Patient's awareness of abnormal movements (rate  only patient's report): No Awareness, Dental Status Current problems with teeth and/or dentures?: No Does patient usually wear dentures?: No  CIWA:    COWS:     Treatment Plan Summary: Daily contact with patient to assess and evaluate symptoms and progress in treatment Medication management  Plan: Continue current plan of care. Plan for discharge tomorrow.  Medical Decision Making Problem Points:  Established problem, stable/improving (1), Review of last therapy session (1) and Review of psycho-social stressors (1) Data Points:  Review of medication regiment & side effects (2)  I certify that inpatient services furnished can reasonably be expected to improve the patient's condition.   Claudia [name] 09/01/2012, 1:10 PM

## 2012-09-02 DIAGNOSIS — F411 Generalized anxiety disorder: Secondary | ICD-10-CM

## 2012-09-02 DIAGNOSIS — F329 Major depressive disorder, single episode, unspecified: Secondary | ICD-10-CM

## 2012-09-02 MED ORDER — HYDROCORTISONE 2.5 % EX CREA: 1.0000 "application " | TOPICAL_CREAM | CUTANEOUS | Status: DC

## 2012-09-02 MED ORDER — AMLODIPINE BESY-BENAZEPRIL HCL 10-20 MG PO CAPS: 1.0000 | ORAL_CAPSULE | Freq: Every day | ORAL | Status: DC

## 2012-09-02 MED ORDER — GABAPENTIN 300 MG PO CAPS: 300.0000 mg | ORAL_CAPSULE | Freq: Three times a day (TID) | ORAL | Status: DC

## 2012-09-02 MED ORDER — OMEGA-3 FATTY ACIDS 1000 MG PO CAPS: 2.0000 g | ORAL_CAPSULE | Freq: Every day | ORAL | Status: DC

## 2012-09-02 MED ORDER — EZETIMIBE 10 MG PO TABS: 10.0000 mg | ORAL_TABLET | Freq: Every day | ORAL | Status: DC

## 2012-09-02 MED ORDER — CHOLECALCIFEROL 10 MCG (400 UNIT) PO TABS: 400.0000 [IU] | ORAL_TABLET | Freq: Every day | ORAL | Status: DC

## 2012-09-02 MED ORDER — ANASTROZOLE 1 MG PO TABS: 1.0000 mg | ORAL_TABLET | Freq: Every day | ORAL | Status: DC

## 2012-09-02 MED ORDER — SERTRALINE HCL 100 MG PO TABS: 200.0000 mg | ORAL_TABLET | Freq: Every day | ORAL | Status: DC

## 2012-09-02 NOTE — Progress Notes (Signed)
[facility] Adult Case Management Discharge Plan :  Will you be returning to the same living situation after discharge: Yes,  Patient is returning to her home. At discharge, do you have transportation home?:Yes,  Patient arranging transportation Do you have the ability to pay for your medications:Yes,  Patient can obtain medications  Release of information consent forms completed and in the chart;  Patient's signature needed at discharge.  Patient to Follow up at: Follow-up Information    Follow up with Blinda Leatherwood. PhD. On 09/06/2012. (You are scheduled with Dr. Jerrye Bushy on Tuesday, September 06, 2012 at 8 AM)    Contact information:   [address]  [address] [place], [place]  [address]  [phone]         Patient denies SI/HI:   Yes,  Patient is not endorsing SI/HI or thoughts of self harm.    Safety Planning and Suicide Prevention discussed:  Yes,  Reviewed during aftercare groups  Sneha Willig, [place] July 09/02/2012, 1:13 PM

## 2012-09-02 NOTE — Progress Notes (Signed)
Adult Psychoeducational Group Note  Date:  09/02/2012 Time:  12:14 PM  Group Topic/Focus:  Relapse Prevention Planning:   The focus of this group is to define relapse and discuss the need for planning to combat relapse.  Participation Level:  Did Not Attend   Additional Comments: Did not see or talk to this pt. She did not attend group and I was not given a reason.  Claudia [name] T 09/02/2012, 12:14 PM

## 2012-09-02 NOTE — Discharge Summary (Signed)
Physician Discharge Summary Note  Patient:  Claudia [name] is an 57 y.o., female MRN:  [id number] DOB:  [birth year] Patient phone:  [phone] (home)  Patient address:   [address] [place] [address],   Date of Admission:  08/29/2012 Date of Discharge: 09/02/2012  Reason for Admission:  Depression with suicidal thoughts  Discharge Diagnoses: Active Problems:  Depression  Anxiety  Review of Systems  Constitutional: Negative.   HENT: Negative.   Eyes: Negative.   Respiratory: Negative.   Cardiovascular: Negative.   Gastrointestinal: Negative.   Genitourinary: Negative.   Skin: Negative.   Neurological: Negative.   Endo/Heme/Allergies: Negative.   Psychiatric/Behavioral: Positive for depression. The patient is nervous/anxious.    Axis Diagnosis:   AXIS I:  Anxiety Disorder NOS and Depressive Disorder NOS AXIS II:  Deferred AXIS III:   Past Medical History  Diagnosis Date  . Anxiety   . Depression   . Hypertension   . Breast cancer   . GERD (gastroesophageal reflux disease)   . Cancer of right breast     mastectomy  . Irritable bowel   . Hiatal hernia    AXIS IV:  economic problems, other psychosocial or environmental problems, problems related to social environment and problems with primary support group AXIS V:  61-70 mild symptoms  Level of Care:  OP  Hospital Course:  Reviewed chart, vital signs, medications, and notes. 1-Admitted for crisis management and stabilization, completed 2-Individual and group therapy attended with participation 3-Medication managed for depression and anxiety to reduce current symptoms to base line and improve the patient's overall level of functioning:  Medications reviewed with the patient and she stated no untoward effects 4-Coping skills for depression and anxiety developed and utilized 5-Addressed health issues--stable, arthritis pain controlled 6-Treatment plan in place to prevent relapse of depression and  anxiety 7-Psychosocial education regarding relapse prevention and self-care 8-Patient denied suicidal/homicidal ideations and auditory/visual hallucinations, follow-up appointments encouraged to attend, outside support groups encouraged and information given, Rx given at discharge, patient stable for discharge  Consults:  None  Significant Diagnostic Studies:  labs: Completed and reviewed, stable  Discharge Vitals:   Blood pressure 96/73, pulse 140, temperature 97.6 F (36.4 C), temperature source Oral, resp. rate 20, height 5\' 4"  (1.626 m), weight 87.091 kg (192 lb), SpO2 98.00%. Body mass index is 32.96 kg/(m^2). Lab Results:   No results found for this or any previous visit (from the past 72 hour(s)).  Physical Findings: AIMS: Facial and Oral Movements Muscles of Facial Expression: None, normal Lips and Perioral Area: None, normal Jaw: None, normal Tongue: None, normal,Extremity Movements Upper (arms, wrists, hands, fingers): None, normal Lower (legs, knees, ankles, toes): None, normal, Trunk Movements Neck, shoulders, hips: None, normal, Overall Severity Severity of abnormal movements (highest score from questions above): None, normal Incapacitation due to abnormal movements: None, normal Patient's awareness of abnormal movements (rate only patient's report): No Awareness, Dental Status Current problems with teeth and/or dentures?: No Does patient usually wear dentures?: No  CIWA:    COWS:     Psychiatric Specialty Exam: See Psychiatric Specialty Exam and Suicide Risk Assessment completed by Attending Physician prior to discharge.  Discharge destination:  Home  Is patient on multiple antipsychotic therapies at discharge:  No   Has Patient had three or more failed trials of antipsychotic monotherapy by history:  No Recommended Plan for Multiple Antipsychotic Therapies:  N/A  Discharge Orders    Future Orders Please Complete By Expires   Diet - low  sodium heart healthy       Activity as tolerated - No restrictions          Medication List     As of 09/02/2012 11:34 AM    STOP taking these medications         BAYER ASPIRIN 325 MG tablet   Generic drug: aspirin      HYDROcodone-acetaminophen 10-325 MG per tablet   Commonly known as: NORCO      TAKE these medications      Indication    alprazolam 2 MG tablet   Commonly known as: XANAX   Take 2 mg by mouth 4 (four) times daily.    Indication: Feeling Anxious      amLODipine-benazepril 10-20 MG per capsule   Commonly known as: LOTREL   Take 1 capsule by mouth at bedtime.    Indication: High Blood Pressure      anastrozole 1 MG tablet   Commonly known as: ARIMIDEX   Take 1 tablet (1 mg total) by mouth at bedtime.    Indication: hormone replacement      cholecalciferol 400 UNITS Tabs   Commonly known as: VITAMIN D   Take 1 tablet (400 Units total) by mouth daily.    Indication: vitamin deficiency      ezetimibe 10 MG tablet   Commonly known as: ZETIA   Take 1 tablet (10 mg total) by mouth daily.    Indication: hyperlipidemia      fish oil-omega-3 fatty acids 1000 MG capsule   Take 2 capsules (2 g total) by mouth daily.    Indication: High Amount of Cholesterol in the Blood      gabapentin 300 MG capsule   Commonly known as: NEURONTIN   Take 1 capsule (300 mg total) by mouth 3 (three) times daily.    Indication: Neurogenic Pain      hydrocortisone 2.5 % cream   Apply 1 application topically See admin instructions. 2-3 times daily as needed for eczema on face       omeprazole 20 MG capsule   Commonly known as: PRILOSEC   Take 20 mg by mouth daily as needed. For acid reflux       sertraline 100 MG tablet   Commonly known as: ZOLOFT   Take 2 tablets (200 mg total) by mouth daily.    Indication: depression           Follow-up Information    Follow up with Blinda Leatherwood. PhD. On 09/06/2012. (You are scheduled with Dr. Jerrye Bushy on Tuesday, September 06, 2012 at [facility])     Contact information:   [address]  [address] [place], [place]  [address]  [phone]         Follow-up recommendations:  Activity as tolerated, low-sodium heart healthy diet  Comments:  Patient will continue her care with her psychologist  Total Discharge Time:  Greater than 30 minutes  Signed: LORD, JAMISON2/01/2013, 11:34 AM, [facility]-NP

## 2012-09-02 NOTE — Progress Notes (Signed)
Discharge Note  D: Patient affect brighter today, as opposed to other previous shifts; her mood was appropriate to the circumstance. She reported on the self inventory sheet that her energy level is normal and ability to pay attention is good. Patient rated depression and feelings of hopelessness "1".  A: Support and encouragement provided to patient. Administered scheduled medications per ordering MD. Discharge instructions/prescriptions given to patient. Returned belongings to patient.  R: Patient receptive. Denies SI/HI/AVH. Patient d/c without incident. Patient verbalized understanding of discharge instructions and prescriptions.

## 2012-09-02 NOTE — Discharge Summary (Signed)
Reviewed

## 2012-09-02 NOTE — Tx Team (Signed)
Interdisciplinary Treatment Plan Update (Adult)  Date:  09/02/2012  Time Reviewed:  9:45 AM   Progress in Treatment: Attending groups:   Yes   Participating in groups:  Yes Taking medication as prescribed:  Yes Tolerating medication:  Yes Family/Significant othe contact made: Contact made with family Patient understands diagnosis:  Yes Discussing patient identified problems/goals with staff: Yes Medical problems stabilized or resolved: Yes Denies suicidal/homicidal ideation:Yes Issues/concerns per patient self-inventory:  Other:   New problem(s) identified:  Reason for Continuation of Hospitalization:  Interventions implemented related to continuation of hospitalization:   Additional comments:  Estimated length of stay: Discharge home today  Discharge Plan:  Home with outpatient follow up  New goal(s):  Review of initial/current patient goals per problem list:    1.  Goal(s): Eliminate SI/other thoughts of self harm (Patient will no longer endorse SI/HI or thoughts of self harm)   Met:  Yes  Target date: d/c  As evidenced by: Patient no longer endorses SI/HI or other thoughts of self harm.    2.  Goal (s):Reduce depression/anxiety (Paitent will rate symptoms at four or below)  Met: Yes  Target date: d/c  As evidenced by: Patient rates symptoms at two today    3.  Goal(s):.stabilize on meds (Patient will report being stabilized on medications)   Met:  Yes  Target date: d/c  As evidenced by: Patient reports being less symptomatic today    4.  Goal(s): Refer for outpatient follow up (Follow up appointment will be scheduled)   Met:  Yes  Target date: d/c  As evidenced by: Follow up appointment scheduled    Attendees: Patient:  Claudia [name] 09/02/2012 9:45 AM  Physican:  Patrick North, MD 09/02/2012 9:45 AM  Nursing:  Lamount Cranker, RN 09/02/2012 9:45 AM   Nursing:   Harold Barban, RN 09/02/2012 9:45 AM   Clinical Social Worker:  Juline Patch,  LCSW 09/02/2012 9:45 AM   Other: 09/02/2012 9:45 AM   Other:         09/02/2012 9:45 AM Other:        09/02/2012 9:45 AM

## 2012-09-02 NOTE — [facility] Suicide Risk Assessment (Signed)
Suicide Risk Assessment  Discharge Assessment     Demographic Factors:  Female, caucasian  Mental Status Per Nursing Assessment::   On Admission:  Suicidal ideation indicated by patient  Current Mental Status by Physician: Alert and oriented to 4. Denies AH/Vh/SI/HI.  Loss Factors: Financial problems/change in socioeconomic status  Historical Factors: Impulsivity  Risk Reduction Factors:   Sense of responsibility to family, Employed and Positive social support  Continued Clinical Symptoms:  Depression:   Recent sense of peace/wellbeing  Cognitive Features That Contribute To Risk:  Cognitively intact  Suicide Risk:  Minimal: No identifiable suicidal ideation.  Patients presenting with no risk factors but with morbid ruminations; may be classified as minimal risk based on the severity of the depressive symptoms  Discharge Diagnoses:   AXIS I:  Depressive Disorder NOS AXIS II:  Deferred AXIS III:   Past Medical History  Diagnosis Date  . Anxiety   . Depression   . Hypertension   . Breast cancer   . GERD (gastroesophageal reflux disease)   . Cancer of right breast     mastectomy  . Irritable bowel   . Hiatal hernia    AXIS IV:  other psychosocial or environmental problems AXIS V:  61-70 mild symptoms  Plan Of Care/Follow-up recommendations:  Activity:  regular Diet:  regular Follow up with outpatient appointments.  Is patient on multiple antipsychotic therapies at discharge:  No   Has Patient had three or more failed trials of antipsychotic monotherapy by history:  No  Recommended Plan for Multiple Antipsychotic Therapies: NA  Oviya Ammar 09/02/2012, 10:42 AM

## 2012-09-02 NOTE — Progress Notes (Signed)
[facility] LCSW Aftercare Discharge Planning Group Note  09/02/2012 10:52 AM  Participation Quality:  Appropriate and Attentive  Affect:  Appropriate  Cognitive:  Alert and Appropriate  Insight:  Engaged  Engagement in Group:  Engaged  Modes of Intervention:  Exploration, Problem-solving, Rapport Building and Support  Summary of Progress/Problems:  Patient reports having difficulty sleeping last night but ready to discharge home today.  She denies SI/HI and rates all symptoms at two today.  Daily workbook provided.  Wynn Banker 09/02/2012, 10:52 AM

## 2012-09-02 NOTE — Progress Notes (Signed)
[facility] Group Notes:  (Nursing/MHT/Case Management/Adjunct)  Date:  09/02/2012  Time:  7:03 PM  Type of Therapy:  Therapeutic Activity-Recovery goals  Participation Level:  Active  Participation Quality:  Appropriate and Attentive  Affect:  Appropriate  Cognitive:  Appropriate  Insight:  Good  Engagement in Group:  Developing/Improving  Modes of Intervention:  Activity, Discussion, Education, Socialization and Support  Summary of Progress/Problems:  Claudia [name] attended and participated in therapeutic activity on recovery goals. Patient stated one goal on recovery for to have when discharged, once written another group member would read the goal and the patient would explain why they chose that goal. Patient was guarded when sharing due to the resistant with sharing in a group setting.   Claudia [name] 09/02/2012, 7:03 PM

## 2012-09-07 NOTE — Progress Notes (Signed)
Patient Discharge Instructions:  After Visit Summary (AVS):   Faxed to:  09/07/12 Discharge Summary Note:   Faxed to:  09/07/12 Psychiatric Admission Assessment Note:   Faxed to:  09/07/12 Suicide Risk Assessment - Discharge Assessment:   Faxed to:  09/07/12 Faxed/Sent to the Next Level Care provider:  09/07/12 Faxed to [facility]/ Blinda Leatherwood. PhD @ [phone]  Jerelene Redden, 09/07/2012, 2:45 PM

## 2012-10-22 ENCOUNTER — Encounter (HOSPITAL_COMMUNITY): Payer: Self-pay | Admitting: *Deleted

## 2012-10-22 ENCOUNTER — Emergency Department (HOSPITAL_COMMUNITY): Payer: BC Managed Care – PPO

## 2012-10-22 ENCOUNTER — Observation Stay (HOSPITAL_COMMUNITY)
Admission: EM | Admit: 2012-10-22 | Discharge: 2012-10-24 | DRG: 065 | Disposition: A | Discharge status: Home / Self Care | Payer: BC Managed Care – PPO | Attending: Internal Medicine | Admitting: Internal Medicine

## 2012-10-22 DIAGNOSIS — F17200 Nicotine dependence, unspecified, uncomplicated: Secondary | ICD-10-CM | POA: Diagnosis present

## 2012-10-22 DIAGNOSIS — Z823 Family history of stroke: Secondary | ICD-10-CM

## 2012-10-22 DIAGNOSIS — R82998 Other abnormal findings in urine: Secondary | ICD-10-CM

## 2012-10-22 DIAGNOSIS — F411 Generalized anxiety disorder: Secondary | ICD-10-CM | POA: Diagnosis present

## 2012-10-22 DIAGNOSIS — E041 Nontoxic single thyroid nodule: Secondary | ICD-10-CM | POA: Diagnosis present

## 2012-10-22 DIAGNOSIS — Z853 Personal history of malignant neoplasm of breast: Secondary | ICD-10-CM

## 2012-10-22 DIAGNOSIS — Z8249 Family history of ischemic heart disease and other diseases of the circulatory system: Secondary | ICD-10-CM

## 2012-10-22 DIAGNOSIS — E7800 Pure hypercholesterolemia, unspecified: Secondary | ICD-10-CM | POA: Diagnosis present

## 2012-10-22 DIAGNOSIS — Z9079 Acquired absence of other genital organ(s): Secondary | ICD-10-CM

## 2012-10-22 DIAGNOSIS — F410 Panic disorder [episodic paroxysmal anxiety] without agoraphobia: Secondary | ICD-10-CM | POA: Diagnosis present

## 2012-10-22 DIAGNOSIS — Z90710 Acquired absence of both cervix and uterus: Secondary | ICD-10-CM

## 2012-10-22 DIAGNOSIS — R42 Dizziness and giddiness: Secondary | ICD-10-CM

## 2012-10-22 DIAGNOSIS — Z79899 Other long term (current) drug therapy: Secondary | ICD-10-CM

## 2012-10-22 DIAGNOSIS — C50919 Malignant neoplasm of unspecified site of unspecified female breast: Secondary | ICD-10-CM | POA: Diagnosis present

## 2012-10-22 DIAGNOSIS — R270 Ataxia, unspecified: Secondary | ICD-10-CM | POA: Diagnosis present

## 2012-10-22 DIAGNOSIS — K589 Irritable bowel syndrome without diarrhea: Secondary | ICD-10-CM | POA: Diagnosis present

## 2012-10-22 DIAGNOSIS — F329 Major depressive disorder, single episode, unspecified: Secondary | ICD-10-CM | POA: Diagnosis present

## 2012-10-22 DIAGNOSIS — H8110 Benign paroxysmal vertigo, unspecified ear: Principal | ICD-10-CM | POA: Diagnosis present

## 2012-10-22 DIAGNOSIS — R279 Unspecified lack of coordination: Secondary | ICD-10-CM | POA: Diagnosis present

## 2012-10-22 DIAGNOSIS — F419 Anxiety disorder, unspecified: Secondary | ICD-10-CM

## 2012-10-22 DIAGNOSIS — R8290 Unspecified abnormal findings in urine: Secondary | ICD-10-CM

## 2012-10-22 DIAGNOSIS — I10 Essential (primary) hypertension: Secondary | ICD-10-CM | POA: Diagnosis present

## 2012-10-22 DIAGNOSIS — E876 Hypokalemia: Secondary | ICD-10-CM | POA: Diagnosis present

## 2012-10-22 DIAGNOSIS — R791 Abnormal coagulation profile: Secondary | ICD-10-CM | POA: Diagnosis present

## 2012-10-22 DIAGNOSIS — Z9010 Acquired absence of unspecified breast and nipple: Secondary | ICD-10-CM

## 2012-10-22 DIAGNOSIS — K219 Gastro-esophageal reflux disease without esophagitis: Secondary | ICD-10-CM | POA: Diagnosis present

## 2012-10-22 DIAGNOSIS — F3289 Other specified depressive episodes: Secondary | ICD-10-CM | POA: Diagnosis present

## 2012-10-22 HISTORY — DX: Pure hypercholesterolemia, unspecified: E78.00

## 2012-10-22 LAB — CBC WITH DIFFERENTIAL/PLATELET
Band Neutrophils: 0 % (ref 0–10)
Basophils Absolute: 0 10*3/uL (ref 0.0–0.1)
Basophils Relative: 0 % (ref 0–1)
Eosinophils Absolute: 0.1 10*3/uL (ref 0.0–0.7)
Eosinophils Relative: 1 % (ref 0–5)
HCT: 37.7 % (ref 36.0–46.0)
Hemoglobin: 12.9 g/dL (ref 12.0–15.0)
Lymphocytes Relative: 33 % (ref 12–46)
Lymphs Abs: 3 10*3/uL (ref 0.7–4.0)
MCH: 30.8 pg (ref 26.0–34.0)
MCHC: 34.2 g/dL (ref 30.0–36.0)
MCV: 90 fL (ref 78.0–100.0)
Monocytes Absolute: 0.5 10*3/uL (ref 0.1–1.0)
Monocytes Relative: 5 % (ref 3–12)
Neutro Abs: 5.3 10*3/uL (ref 1.7–7.7)
Neutrophils Relative %: 60 % (ref 43–77)
Platelets: 204 10*3/uL (ref 150–400)
RBC: 4.19 MIL/uL (ref 3.87–5.11)
RDW: 13.3 % (ref 11.5–15.5)
WBC: 8.8 10*3/uL (ref 4.0–10.5)

## 2012-10-22 LAB — URINALYSIS, ROUTINE W REFLEX MICROSCOPIC
Bilirubin Urine: NEGATIVE
Glucose, UA: NEGATIVE mg/dL
Ketones, ur: NEGATIVE mg/dL
Leukocytes, UA: NEGATIVE
Nitrite: NEGATIVE
Protein, ur: NEGATIVE mg/dL
Specific Gravity, Urine: >1.03 — ABNORMAL HIGH (ref 1.005–1.030)
Urobilinogen, UA: 0.2 mg/dL (ref 0.0–1.0)
pH: 6 (ref 5.0–8.0)

## 2012-10-22 LAB — BASIC METABOLIC PANEL
BUN: 12 mg/dL (ref 6–23)
CO2: 23 mEq/L (ref 19–32)
Calcium: 8.8 mg/dL (ref 8.4–10.5)
Chloride: 105 mEq/L (ref 96–112)
Creatinine, Ser: 0.51 mg/dL (ref 0.50–1.10)
GFR calc Af Amer: >90 mL/min (ref >90)
GFR calc non Af Amer: >90 mL/min (ref >90)
Glucose, Bld: 94 mg/dL (ref 70–99)
Potassium: 3.4 mEq/L — ABNORMAL LOW (ref 3.5–5.1)
Sodium: 137 mEq/L (ref 135–145)

## 2012-10-22 LAB — URINE MICROSCOPIC-ADD ON

## 2012-10-22 MED ORDER — ANASTROZOLE 1 MG PO TABS
1.0000 mg | ORAL_TABLET | Freq: Every day | ORAL | Status: DC
  Administered 2012-10-23: 1 mg via ORAL
  Filled 2012-10-22 (×3): qty 1

## 2012-10-22 MED ORDER — SODIUM CHLORIDE 0.9 % IV BOLUS (SEPSIS)
1000.0000 mL | Freq: Once | INTRAVENOUS | Status: AC
  Administered 2012-10-22: 1000 mL via INTRAVENOUS

## 2012-10-22 MED ORDER — ONDANSETRON HCL 4 MG PO TABS: 4.0000 mg | ORAL_TABLET | Freq: Four times a day (QID) | ORAL | Status: DC | PRN

## 2012-10-22 MED ORDER — POTASSIUM CHLORIDE IN NACL 20-0.9 MEQ/L-% IV SOLN
INTRAVENOUS | Status: AC
  Administered 2012-10-22: 22:00:00 via INTRAVENOUS

## 2012-10-22 MED ORDER — ASPIRIN EC 325 MG PO TBEC
325.0000 mg | DELAYED_RELEASE_TABLET | Freq: Every day | ORAL | Status: DC
  Administered 2012-10-23 – 2012-10-24 (×2): 325 mg via ORAL
  Filled 2012-10-22 (×3): qty 1

## 2012-10-22 MED ORDER — ONDANSETRON HCL 4 MG/2ML IJ SOLN: 4.0000 mg | Freq: Four times a day (QID) | INTRAMUSCULAR | Status: DC | PRN

## 2012-10-22 MED ORDER — ACETAMINOPHEN 650 MG RE SUPP: 650.0000 mg | Freq: Four times a day (QID) | RECTAL | Status: DC | PRN

## 2012-10-22 MED ORDER — ASPIRIN 81 MG PO CHEW
324.0000 mg | CHEWABLE_TABLET | Freq: Once | ORAL | Status: AC
  Administered 2012-10-22: 324 mg via ORAL
  Filled 2012-10-22: qty 4

## 2012-10-22 MED ORDER — ALBUTEROL SULFATE (5 MG/ML) 0.5% IN NEBU: 2.5000 mg | INHALATION_SOLUTION | RESPIRATORY_TRACT | Status: DC | PRN

## 2012-10-22 MED ORDER — AMLODIPINE BESYLATE 5 MG PO TABS
10.0000 mg | ORAL_TABLET | Freq: Every day | ORAL | Status: DC
  Filled 2012-10-22: qty 2

## 2012-10-22 MED ORDER — ENOXAPARIN SODIUM 40 MG/0.4ML ~~LOC~~ SOLN
40.0000 mg | SUBCUTANEOUS | Status: DC
  Administered 2012-10-22 – 2012-10-23 (×2): 40 mg via SUBCUTANEOUS
  Filled 2012-10-22 (×2): qty 0.4

## 2012-10-22 MED ORDER — OXYCODONE HCL 5 MG PO TABS: 5.0000 mg | ORAL_TABLET | ORAL | Status: DC | PRN

## 2012-10-22 MED ORDER — ALPRAZOLAM 1 MG PO TABS
1.0000 mg | ORAL_TABLET | Freq: Four times a day (QID) | ORAL | Status: DC | PRN
  Administered 2012-10-22 – 2012-10-24 (×3): 1 mg via ORAL
  Filled 2012-10-22 (×3): qty 1

## 2012-10-22 MED ORDER — MECLIZINE HCL 12.5 MG PO TABS
25.0000 mg | ORAL_TABLET | Freq: Three times a day (TID) | ORAL | Status: DC
  Administered 2012-10-22 – 2012-10-24 (×6): 25 mg via ORAL
  Filled 2012-10-22 (×6): qty 2

## 2012-10-22 MED ORDER — SODIUM CHLORIDE 0.9 % IJ SOLN
3.0000 mL | Freq: Two times a day (BID) | INTRAMUSCULAR | Status: DC
  Administered 2012-10-24: 3 mL via INTRAVENOUS

## 2012-10-22 MED ORDER — HYDROCODONE-ACETAMINOPHEN 10-325 MG PO TABS
1.0000 | ORAL_TABLET | Freq: Four times a day (QID) | ORAL | Status: DC | PRN
  Administered 2012-10-23 – 2012-10-24 (×2): 1 via ORAL
  Filled 2012-10-22 (×2): qty 1

## 2012-10-22 MED ORDER — AMLODIPINE BESY-BENAZEPRIL HCL 10-20 MG PO CAPS: 1.0000 | ORAL_CAPSULE | Freq: Every day | ORAL | Status: DC

## 2012-10-22 MED ORDER — BENAZEPRIL HCL 10 MG PO TABS
20.0000 mg | ORAL_TABLET | Freq: Every day | ORAL | Status: DC
  Filled 2012-10-22: qty 2

## 2012-10-22 MED ORDER — PANTOPRAZOLE SODIUM 40 MG PO TBEC
40.0000 mg | DELAYED_RELEASE_TABLET | Freq: Every day | ORAL | Status: DC
  Administered 2012-10-22 – 2012-10-24 (×3): 40 mg via ORAL
  Filled 2012-10-22 (×3): qty 1

## 2012-10-22 MED ORDER — ACETAMINOPHEN 325 MG PO TABS: 650.0000 mg | ORAL_TABLET | Freq: Four times a day (QID) | ORAL | Status: DC | PRN

## 2012-10-22 MED ORDER — SERTRALINE HCL 50 MG PO TABS
250.0000 mg | ORAL_TABLET | Freq: Every day | ORAL | Status: DC
  Administered 2012-10-23 – 2012-10-24 (×2): 250 mg via ORAL
  Filled 2012-10-22 (×3): qty 5

## 2012-10-22 MED ORDER — EZETIMIBE 10 MG PO TABS
10.0000 mg | ORAL_TABLET | Freq: Every day | ORAL | Status: DC
  Administered 2012-10-23 – 2012-10-24 (×2): 10 mg via ORAL
  Filled 2012-10-22 (×3): qty 1

## 2012-10-22 NOTE — ED Provider Notes (Signed)
History  This chart was scribed for Raeford Razor, MD by Shari Heritage, ED Scribe. The patient was seen in room APA11/APA11. Patient's care was started at 1700.   CSN: [id number]  Arrival date & time 10/22/12  1506   First MD Initiated Contact with Patient 10/22/12 1700      Chief Complaint  Patient presents with  . Dizziness  . Extremity Weakness     Patient is a 57 y.o. female presenting with neurologic complaint. The history is provided by the patient. No language interpreter was used.  Neurologic Problem The primary symptoms include dizziness and visual change. The symptoms began 12 to 24 hours ago. The neurological symptoms are diffuse. Context: after waking up.  She describes the dizziness as a sensation of spinning. Dizziness also occurs with blurred vision and weakness.  The visual change began today. The visual change has been resolved since its onset. The visual change includes blurred vision.  Additional symptoms include weakness and loss of balance. Medical issues do not include seizures.    HPI Comments: GRIFFIN DEWILDE is a 57 y.o. female who presents to the Emergency Department complaining of extremity weakness, lightheadedness and dizziness onset 14 hours ago. Patient has difficulty characterizing symptoms, but she states that when she woke up at 3 am, she felt dizzy, had blurred vision and had lower extremity weakness. Patient describes dizzy sensation as like objects in the room are moving, but denies that room is spinning. She says that she staggered while walking and felt like she was losing her balance. Had to told hold onto walls. Significant other states that pt was "walking like a new born calf." Patient states that she went back to sleep for several hours, but still had lightheadedness and extremity "heaviness." Patient has a history of panic attacks and anxiety but says these are not her typical symptoms. Also hx of breast cancer, GERD and hypercholesteremia. Patient  takes Zoloft and Xanax for severe anxiety.   Past Medical History  Diagnosis Date  . Anxiety   . Depression   . Hypertension   . Breast cancer   . GERD (gastroesophageal reflux disease)   . Cancer of right breast     mastectomy  . Irritable bowel   . Hiatal hernia   . Hypercholesterolemia     Past Surgical History  Procedure Laterality Date  . Mastectomy    . Tubal ligation    . Abdominal hysterectomy    . Total abdominal hysterectomy w/ bilateral salpingoophorectomy    . Tonsillectomy    . Cholecystectomy    . Foot surgery      bilateral spur removal    No family history on file.  History  Substance Use Topics  . Smoking status: Never Smoker   . Smokeless tobacco: Not on file  . Alcohol Use: No    OB History   Grav Para Term Preterm Abortions TAB SAB Ect Mult Living                  Review of Systems  Eyes: Positive for blurred vision.  Neurological: Positive for dizziness, weakness, light-headedness and loss of balance.  All other systems reviewed and are negative.    Allergies  Review of patient's allergies indicates no known allergies.  Home Medications   Current Outpatient Rx  Name  Route  Sig  Dispense  Refill  . alprazolam (XANAX) 2 MG tablet   Oral   Take 2 mg by mouth 4 (four) times daily.          Marland Kitchen  amLODipine-benazepril (LOTREL) 10-20 MG per capsule   Oral   Take 1 capsule by mouth at bedtime.   30 capsule   0   . anastrozole (ARIMIDEX) 1 MG tablet   Oral   Take 1 tablet (1 mg total) by mouth at bedtime.   1 tablet   0   . cholecalciferol (VITAMIN D) 400 UNITS TABS   Oral   Take 1 tablet (400 Units total) by mouth daily.   30 each   0   . ezetimibe (ZETIA) 10 MG tablet   Oral   Take 1 tablet (10 mg total) by mouth daily.   30 tablet   0   . fish oil-omega-3 fatty acids 1000 MG capsule   Oral   Take 2 capsules (2 g total) by mouth daily.   60 capsule   0   . gabapentin (NEURONTIN) 300 MG capsule   Oral   Take 1  capsule (300 mg total) by mouth 3 (three) times daily.   90 capsule   0   . hydrocortisone 2.5 % cream   Topical   Apply 1 application topically See admin instructions. 2-3 times daily as needed for eczema on face   30 g   0   . omeprazole (PRILOSEC) 20 MG capsule   Oral   Take 20 mg by mouth daily as needed. For acid reflux         . sertraline (ZOLOFT) 100 MG tablet   Oral   Take 2 tablets (200 mg total) by mouth daily.   60 tablet   0     Triage Vitals: BP 128/78  Pulse 106  Temp(Src) 98.4 F (36.9 C) (Oral)  Resp 20  Ht 5\' 4"  (1.626 m)  Wt 180 lb (81.647 kg)  BMI 30.88 kg/m2  SpO2 97%  Physical Exam  Nursing note and vitals reviewed. Constitutional: She appears well-developed and well-nourished. No distress.  HENT:  Head: Normocephalic and atraumatic.  Eyes: Conjunctivae are normal. Right eye exhibits no discharge. Left eye exhibits no discharge.  Neck: Neck supple.  Cardiovascular: Normal rate, regular rhythm and normal heart sounds.  Exam reveals no gallop and no friction rub.   No murmur heard. Pulmonary/Chest: Effort normal and breath sounds normal. No respiratory distress.  Abdominal: Soft. She exhibits no distension. There is no tenderness.  Musculoskeletal: She exhibits no edema and no tenderness.  Neurological: She is alert.  CN 2-12 intact aside from mild left eye ptosis. Good finger-to-nose testing. Mildly ataxic. Difficulty with heel-to-toe walking. Strength normal in upper and lower extremities. Sensation intact to light touch.   Skin: Skin is warm and dry.  Psychiatric: She has a normal mood and affect. Her behavior is normal. Thought content normal.    ED Course  Procedures (including critical care time) DIAGNOSTIC STUDIES: Oxygen Saturation is 97% on room air, adequate by my interpretation.    COORDINATION OF CARE: 5:23 PM- Patient informed of current plan for treatment and evaluation and agrees with plan at this time.    Labs Reviewed   URINALYSIS, ROUTINE W REFLEX MICROSCOPIC - Abnormal; Notable for the following:    Specific Gravity, Urine >1.030 (*)    Hgb urine dipstick MODERATE (*)    All other components within normal limits  BASIC METABOLIC PANEL - Abnormal; Notable for the following:    Potassium 3.4 (*)    All other components within normal limits  URINE MICROSCOPIC-ADD ON - Abnormal; Notable for the following:    Squamous Epithelial /  LPF FEW (*)    All other components within normal limits  CBC WITH DIFFERENTIAL    Ct Head Wo Contrast  10/22/2012  *RADIOLOGY REPORT*  Clinical Data: Dizziness.  Extremity weakness.  CT HEAD WITHOUT CONTRAST  Technique:  Contiguous axial images were obtained from the base of the skull through the vertex without contrast.  Comparison: None.  Findings: No acute cortical infarct, hemorrhage, mass lesion is present.  Chronic left maxillary sinus opacification is evident. Minimal left ethmoid sinus disease is present.  The paranasal sinuses and mastoid air cells are otherwise clear.  The osseous skull is intact.  IMPRESSION:  1.  Normal CT appearance of the brain. 2.  Chronic left maxillary sinus disease.   Original Report Authenticated By: Marin Roberts, M.D.    EKG:  Rhythm: normal sinus Vent. rate 82 BPM PR interval 178 ms QRS duration 86 ms QT/QTc 406/474 ms ST segments: normal Comparison: none    1. Ataxia       MDM  56yF with "dizziness." Doesn't give clear cut history of vertigo or pre-syncopal symptoms. Seems more consistent ataxia and has ataxic gait on exam. Concern for possible cerebellar stroke. CT neg but not greatest modality for posterior fossa pathology. EKG very normal appearing. Will admit for further eval.       I personally preformed the services scribed in my presence. The recorded information has been reviewed is accurate. Raeford Razor, MD.    Raeford Razor, MD 10/22/12 2008

## 2012-10-22 NOTE — ED Notes (Signed)
Blurred vision, dizziness, weakness to both legs causing her to stagger, which has since resolved. States arms and legs have been weak for several days. Pt states symptoms began at ~0300 this morning.

## 2012-10-22 NOTE — H&P (Signed)
[facility] History and Physical  Claudia [name] AVW:[id number] DOB: [birth year] DOA: 10/22/2012   PCP: She is followed by a Dr. Webb Laws in [place]. She's also followed by Dr. Ubaldo Glassing, who is her oncologist for breast cancer.  Chief Complaint: Unsteady gait and dizziness since 3 AM today  HPI: Claudia [name] is a 57 y.o. female with a past medical history of breast cancer, panic disorder, hypertension, who was in her usual state of health at 3 AM this morning when she got up from her couch and felt unsteady on her gait. She got really dizzy, and started falling around, but did not really fall down. She had blurred vision followed by onset of anxiety. She took her medications, that she normally takes and took aspirin. She sat back down. And, then she noted that whenever she would move her head she would feel the dizziness. The dizziness was described as if the room was spinning around her head. She subsequently went back to sleep and when she woke up she again, she felt lightheaded. Denies any ear pain, however, has been hanging having a ringing sensation in both her ears. Denies any discharge from her ears. She said about a week ago she was feeling weak and her arms felt heavy. She felt chills, but denies any fever. Denies any weakness on any one side of the body. She denies leaning towards any one side when she was ambulating. The symptoms concerned her so, she came in to the hospital.  Home Medications: Prior to Admission medications   Medication Sig Start Date End Date Taking? Authorizing Provider  alprazolam Prudy Feeler) 2 MG tablet Take 2 mg by mouth 4 (four) times daily.    Yes Historical Provider, MD  amLODipine-benazepril (LOTREL) 10-20 MG per capsule Take 1 capsule by mouth at bedtime. 09/02/12  Yes Nanine Means, NP  anastrozole (ARIMIDEX) 1 MG tablet Take 1 tablet (1 mg total) by mouth at bedtime. 09/02/12  Yes Nanine Means, NP  cholecalciferol (VITAMIN D) 400 UNITS  TABS Take 400 Units by mouth 2 (two) times daily. 09/02/12  Yes Nanine Means, NP  ezetimibe (ZETIA) 10 MG tablet Take 1 tablet (10 mg total) by mouth daily. 09/02/12  Yes Nanine Means, NP  fish oil-omega-3 fatty acids 1000 MG capsule Take 2 capsules (2 g total) by mouth daily. 09/02/12  Yes Nanine Means, NP  HYDROcodone-acetaminophen (NORCO) 10-325 MG per tablet Take 1 tablet by mouth 3 (three) times daily as needed for pain.   Yes Historical Provider, MD  hydrocortisone 2.5 % cream Apply 1 application topically See admin instructions. 2-3 times daily as needed for eczema on face 09/02/12  Yes Nanine Means, NP  sertraline (ZOLOFT) 100 MG tablet Take 250 mg by mouth daily. 09/02/12  Yes Nanine Means, NP  omeprazole (PRILOSEC) 20 MG capsule Take 20 mg by mouth daily as needed. For acid reflux    Historical Provider, MD    Allergies:  Allergies  Allergen Reactions  . Statins Other (See Comments)    Muscle pain    Past Medical History: Past Medical History  Diagnosis Date  . Anxiety   . Depression   . Hypertension   . Breast cancer   . GERD (gastroesophageal reflux disease)   . Cancer of right breast     mastectomy  . Irritable bowel   . Hiatal hernia   . Hypercholesterolemia     Past Surgical History  Procedure Laterality Date  . Mastectomy    . Tubal  ligation    . Abdominal hysterectomy    . Total abdominal hysterectomy w/ bilateral salpingoophorectomy    . Tonsillectomy    . Cholecystectomy    . Foot surgery      bilateral spur removal    Social History:  reports that she has been smoking Cigarettes.  She has been smoking about 0.00 packs per day. She does not have any smokeless tobacco history on file. She reports that she does not drink alcohol or use illicit drugs.  Living Situation: She lives in [place] with her boyfriend Activity Level: She is usually independent with daily activities   Family History: she mentions a history of heart disease, and stroke in the family  Review  of Systems - History obtained from the patient General ROS: positive for  - fatigue Psychological ROS: positive for - anxiety Ophthalmic ROS: positive for - blurry vision ENT ROS: positive for - tinnitus Allergy and Immunology ROS: negative Hematological and Lymphatic ROS: negative Endocrine ROS: negative Respiratory ROS: no cough, shortness of breath, or wheezing Cardiovascular ROS: no chest pain or dyspnea on exertion Gastrointestinal ROS: no abdominal pain, change in bowel habits, or black or bloody stools Genito-Urinary ROS: no dysuria, trouble voiding, or hematuria Musculoskeletal ROS: negative Neurological ROS: as in hpi Dermatological ROS: negative  Physical Examination  Filed Vitals:   10/22/12 1510 10/22/12 1743  BP: 128/78 116/73  Pulse: 106   Temp: 98.4 F (36.9 C)   TempSrc: Oral   Resp: 20 14  Height: 5\' 4"  (1.626 m)   Weight: 81.647 kg (180 lb)   SpO2: 97% 99%    General appearance: alert, cooperative, appears stated age and no distress Head: Normocephalic, without obvious abnormality, atraumatic Eyes: conjunctivae/corneas clear. PERRL, EOM's intact. Ears: Examination of the right ear revealed lot of cerumen. Tympanic membrane could not be visualized. Left ear did not reveal any cerumen and tympanic membrane was normal with good light reflex. Throat: lips, mucosa, and tongue normal; teeth and gums normal Neck: no adenopathy, no carotid bruit, no JVD, supple, symmetrical, trachea midline and thyroid not enlarged, symmetric, no tenderness/mass/nodules Resp: clear to auscultation bilaterally Cardio: regular rate and rhythm, S1, S2 normal, no murmur, click, rub or gallop GI: soft, non-tender; bowel sounds normal; no masses,  no organomegaly Extremities: extremities normal, atraumatic, no cyanosis or edema Pulses: 2+ and symmetric Skin: Skin color, texture, turgor normal. No rashes or lesions Lymph nodes: Cervical, supraclavicular, and axillary nodes  normal. Neurologic: She is alert and oriented x3. Cranial nerves are intact. Motor strength is equal bilaterally. No cerebellar abnormalities were noted on examination. No pronator drift was identified. Gait was not assessed by myself, however was ambulated by the ED physician and revealed unsteady gait  Laboratory Data: Results for orders placed during the hospital encounter of 10/22/12 (from the past 48 hour(s))  BASIC METABOLIC PANEL     Status: Abnormal   Collection Time    10/22/12  5:34 PM      Result Value Range   Sodium 137  135 - 145 mEq/L   Potassium 3.4 (*) 3.5 - 5.1 mEq/L   Chloride 105  96 - 112 mEq/L   CO2 23  19 - 32 mEq/L   Glucose, Bld 94  70 - 99 mg/dL   BUN 12  6 - 23 mg/dL   Creatinine, Ser 1.61  0.50 - 1.10 mg/dL   Calcium 8.8  8.4 - 09.6 mg/dL   GFR calc non Af Amer >90  >90 mL/min  GFR calc Af Amer >90  >90 mL/min   Comment:            The eGFR has been calculated     using the CKD EPI equation.     This calculation has not been     validated in all clinical     situations.     eGFR's persistently     <90 mL/min signify     possible Chronic Kidney Disease.  CBC WITH DIFFERENTIAL     Status: None   Collection Time    10/22/12  5:34 PM      Result Value Range   WBC 8.8  4.0 - 10.5 K/uL   RBC 4.19  3.87 - 5.11 MIL/uL   Hemoglobin 12.9  12.0 - 15.0 g/dL   HCT 02.7  25.3 - 66.4 %   MCV 90.0  78.0 - 100.0 fL   MCH 30.8  26.0 - 34.0 pg   MCHC 34.2  30.0 - 36.0 g/dL   RDW 40.3  47.4 - 25.9 %   Platelets 204  150 - 400 K/uL   Neutrophils Relative 60  43 - 77 %   Neutro Abs 5.3  1.7 - 7.7 K/uL   Band Neutrophils 0  0 - 10 %   Lymphocytes Relative 33  12 - 46 %   Lymphs Abs 3.0  0.7 - 4.0 K/uL   Monocytes Relative 5  3 - 12 %   Monocytes Absolute 0.5  0.1 - 1.0 K/uL   Eosinophils Relative 1  0 - 5 %   Eosinophils Absolute 0.1  0.0 - 0.7 K/uL   Basophils Relative 0  0 - 1 %   Basophils Absolute 0.0  0.0 - 0.1 K/uL  URINALYSIS, ROUTINE W REFLEX  MICROSCOPIC     Status: Abnormal   Collection Time    10/22/12  7:41 PM      Result Value Range   Color, Urine YELLOW  YELLOW   APPearance CLEAR  CLEAR   Specific Gravity, Urine >1.030 (*) 1.005 - 1.030   pH 6.0  5.0 - 8.0   Glucose, UA NEGATIVE  NEGATIVE mg/dL   Hgb urine dipstick MODERATE (*) NEGATIVE   Bilirubin Urine NEGATIVE  NEGATIVE   Ketones, ur NEGATIVE  NEGATIVE mg/dL   Protein, ur NEGATIVE  NEGATIVE mg/dL   Urobilinogen, UA 0.2  0.0 - 1.0 mg/dL   Nitrite NEGATIVE  NEGATIVE   Leukocytes, UA NEGATIVE  NEGATIVE  URINE MICROSCOPIC-ADD ON     Status: Abnormal   Collection Time    10/22/12  7:41 PM      Result Value Range   Squamous Epithelial / LPF FEW (*) RARE   WBC, UA 7-10  <3 WBC/hpf   RBC / HPF 7-10  <3 RBC/hpf   Bacteria, UA RARE  RARE    Radiology Reports: Ct Head Wo Contrast  10/22/2012  *RADIOLOGY REPORT*  Clinical Data: Dizziness.  Extremity weakness.  CT HEAD WITHOUT CONTRAST  Technique:  Contiguous axial images were obtained from the base of the skull through the vertex without contrast.  Comparison: None.  Findings: No acute cortical infarct, hemorrhage, mass lesion is present.  Chronic left maxillary sinus opacification is evident. Minimal left ethmoid sinus disease is present.  The paranasal sinuses and mastoid air cells are otherwise clear.  The osseous skull is intact.  IMPRESSION:  1.  Normal CT appearance of the brain. 2.  Chronic left maxillary sinus disease.   Original Report Authenticated  By: Marin Roberts, M.D.     Electrocardiogram: Sinus rhythm at 82 beats per minute. Normal axis. No Q waves. Intervals are normal. No concerning ST or T-wave changes are noted.  Problem List  Principal Problem:   Ataxia Active Problems:   Vertigo   History of breast cancer   Panic disorder   Assessment: This is a 57 year old, Caucasian female, who presents with unsteady gait and dizziness. When she was ambulated in the ED by the ED physician she was  extremely unsteady. It's possible that she has ataxia but more likely to be due to vertigo. Her dizziness is more suggestive of vertigo. This goes along with the fact, that she has tinnitus. Other differential includes posterior circulation stroke.  Plan: #1 ataxia with vertiginous symptoms: CT reassuringly was normal. We will place the patient on meclizine for now. We'll get MRI with and without without contrast due to her history of breast cancer. Aspirin will be continued for now. Swallow screen will be obtained. Echocardiogram and carotid Dopplers will be obtained. Lipid panel will be checked.  #2 mild hypokalemia. Will be repleted intravenously.  #3 abnormal UA: Does not really suggest infection. Will get a urine culture. Since she is afebrile and has normal white count we will not initiate any antibiotics for now.  #4 history of breast cancer in remission: Continue to monitor.  #5 history of anxiety disorder and panic disorder: Continue with her Zoloft and Xanax.  #6 history of hypertension: Continue with antihypertensive regimen.  DVT Prophylaxis: Enoxaparin Code Status: She is a full code Family Communication: Discussed with the patient in detail  Disposition Plan: Will likely return home when improved. Physical and occupational therapy will be consulted as well.   Further management decisions will depend on results of further testing and patient's response to treatment.  [facility]  [facility] Pager [phone]  If 7PM-7AM, please contact night-coverage [email or web address] Password [facility]  10/22/2012, 9:15 PM

## 2012-10-23 ENCOUNTER — Inpatient Hospital Stay (HOSPITAL_COMMUNITY): Payer: BC Managed Care – PPO

## 2012-10-23 DIAGNOSIS — F410 Panic disorder [episodic paroxysmal anxiety] without agoraphobia: Secondary | ICD-10-CM

## 2012-10-23 DIAGNOSIS — E041 Nontoxic single thyroid nodule: Secondary | ICD-10-CM | POA: Diagnosis present

## 2012-10-23 LAB — COMPREHENSIVE METABOLIC PANEL
AST: 12 U/L (ref 0–37)
Albumin: 3 g/dL — ABNORMAL LOW (ref 3.5–5.2)
Calcium: 8.4 mg/dL (ref 8.4–10.5)
Creatinine, Ser: 0.51 mg/dL (ref 0.50–1.10)
GFR calc non Af Amer: >90 mL/min (ref >90)

## 2012-10-23 LAB — CBC
Hemoglobin: 12.3 g/dL (ref 12.0–15.0)
MCH: 30.1 pg (ref 26.0–34.0)
MCV: 90.5 fL (ref 78.0–100.0)
RBC: 4.09 MIL/uL (ref 3.87–5.11)

## 2012-10-23 LAB — PROTIME-INR
Prothrombin Time: 21.1 seconds — ABNORMAL HIGH (ref 11.6–15.2)
Prothrombin Time: 13 seconds (ref 11.6–15.2)

## 2012-10-23 LAB — LIPID PANEL
Cholesterol: 159 mg/dL (ref 0–200)
HDL: 39 mg/dL — ABNORMAL LOW (ref >39)
Total CHOL/HDL Ratio: 4.1 RATIO

## 2012-10-23 LAB — TSH: TSH: 0.581 u[IU]/mL (ref 0.350–4.500)

## 2012-10-23 LAB — HEMOGLOBIN A1C
Hgb A1c MFr Bld: 5.4 % (ref <5.7)
Mean Plasma Glucose: 108 mg/dL (ref <117)

## 2012-10-23 MED ORDER — SODIUM CHLORIDE 0.9 % IV SOLN
INTRAVENOUS | Status: DC
  Administered 2012-10-24: 04:00:00 via INTRAVENOUS

## 2012-10-23 NOTE — Progress Notes (Signed)
[facility] PROGRESS NOTE  Claudia [name] ZOX:[id number] DOB: 1956-05-04 DOA: 10/22/2012 PCP: No primary provider on file.  Assessment/Plan: 1. Ataxia with vertiginous symptoms. CT negative for acute process. She feels the meclizine is helping her. MRI of the brain is currently pending. Continue aspirin for now. Carotid Dopplers are unremarkable. Echocardiogram is pending. PT eval is pending 2. History of breast cancer in remission. Continue Arimidex 3. Hypertension. Controlled 4. History of anxiety disorder and panic disorder. On Zoloft and Xanax. 5. Coagulopathy. INR was elevated at 1.9. Patient does not appear to be in any Coumadin. We will repeat INR to confirm that this is a true value. If this value is correct, we will need to followup with her oncologist to see if prior blood work can be obtained. 6. Thyroid nodules. Patient is to have thyroid nodules on carotid ultrasounds. This is an incidental finding and will make further workup as an outpatient.  Code Status: full code Family Communication: discussed with patient Disposition Plan: pending further work up   Consultants:  none  Procedures:  none  Antibiotics:  none  HPI/Subjective: Feeling better, dizziness improving.  Objective: Filed Vitals:   10/22/12 2215 10/22/12 2225 10/22/12 2233 10/23/12 0605  BP: 112/73 109/73 109/77 105/65  Pulse: 65 71 79 75  Temp: 98.3 F (36.8 C)   97.9 F (36.6 C)  TempSrc: Oral     Resp: 20   20  Height: 5\' 4"  (1.626 m)     Weight: 89.7 kg (197 lb 12 oz)     SpO2: 98%   96%    Intake/Output Summary (Last 24 hours) at 10/23/12 1508 Last data filed at 10/23/12 1300  Gross per 24 hour  Intake    240 ml  Output   1500 ml  Net  -1260 ml   Filed Weights   10/22/12 1510 10/22/12 2215  Weight: 81.647 kg (180 lb) 89.7 kg (197 lb 12 oz)    Exam:   General:  NAD  Cardiovascular: S1, S2 RRR  Respiratory: CTAB  Abdomen: soft, nt, bs+  Musculoskeletal: no edema  b/l   Data Reviewed: Basic Metabolic Panel:  Recent Labs Lab 10/22/12 1734 10/23/12 0516  NA 137 138  K 3.4* 3.9  CL 105 108  CO2 23 24  GLUCOSE 94 96  BUN 12 11  CREATININE 0.51 0.51  CALCIUM 8.8 8.4   Liver Function Tests:  Recent Labs Lab 10/23/12 0516  AST 12  ALT 9  ALKPHOS 73  BILITOT 0.3  PROT 6.3  ALBUMIN 3.0*   No results found for this basename: LIPASE, AMYLASE,  in the last 168 hours No results found for this basename: AMMONIA,  in the last 168 hours CBC:  Recent Labs Lab 10/22/12 1734 10/23/12 0516  WBC 8.8 6.2  NEUTROABS 5.3  --   HGB 12.9 12.3  HCT 37.7 37.0  MCV 90.0 90.5  PLT 204 181   Cardiac Enzymes: No results found for this basename: CKTOTAL, CKMB, CKMBINDEX, TROPONINI,  in the last 168 hours BNP (last 3 results) No results found for this basename: PROBNP,  in the last 8760 hours CBG: No results found for this basename: GLUCAP,  in the last 168 hours  No results found for this or any previous visit (from the past 240 hour(s)).   Studies: Ct Head Wo Contrast  10/22/2012  *RADIOLOGY REPORT*  Clinical Data: Dizziness.  Extremity weakness.  CT HEAD WITHOUT CONTRAST  Technique:  Contiguous axial images were obtained from the  base of the skull through the vertex without contrast.  Comparison: None.  Findings: No acute cortical infarct, hemorrhage, mass lesion is present.  Chronic left maxillary sinus opacification is evident. Minimal left ethmoid sinus disease is present.  The paranasal sinuses and mastoid air cells are otherwise clear.  The osseous skull is intact.  IMPRESSION:  1.  Normal CT appearance of the brain. 2.  Chronic left maxillary sinus disease.   Original Report Authenticated By: Marin Roberts, M.D.    US Carotid Duplex Bilateral  10/23/2012  *RADIOLOGY REPORT*  Clinical Data: Ataxia.  BILATERAL CAROTID DUPLEX ULTRASOUND  Technique: Wallace Cullens scale imaging, color Doppler and duplex ultrasound was performed of bilateral carotid  and vertebral arteries in the neck.  Comparison:  None.  Criteria:  Quantification of carotid stenosis is based on velocity parameters that correlate the residual internal carotid diameter with NASCET-based stenosis levels, using the diameter of the distal internal carotid lumen as the denominator for stenosis measurement.  The following velocity measurements were obtained:                   PEAK SYSTOLIC/END DIASTOLIC RIGHT ICA:                        71cm/sec CCA:                        72cm/sec SYSTOLIC ICA/CCA RATIO:     1.0 DIASTOLIC ICA/CCA RATIO:    1.1 ECA:                        80cm/sec  LEFT ICA:                        73cm/sec CCA:                        79cm/sec SYSTOLIC ICA/CCA RATIO:     0.9 DIASTOLIC ICA/CCA RATIO:    1.4 ECA:                        67cm/sec  Findings:  RIGHT CAROTID ARTERY: Intimal thickening in the right common carotid artery.  Normal waveforms and velocities in the right internal carotid artery.  RIGHT VERTEBRAL ARTERY:  Antegrade flow and normal waveform in the right vertebral artery.  LEFT CAROTID ARTERY: Mild plaque and wall thickening at the left carotid bulb.  Small amount of plaque in the proximal internal carotid artery.  Normal waveforms and velocities within the left internal carotid artery.  LEFT VERTEBRAL ARTERY:  Antegrade flow and normal waveform in the left vertebral artery.  Other findings:  There is a heterogeneous nodule in the right thyroid lobe that measures 1.5 x 1.1 x 1.1 cm.  There is a solid nodule in the left thyroid lobe that measures 1.3 x 1.6 x 1.3 cm.  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  Estimated degree of stenosis in the internal carotid arteries is less than 50% bilaterally.  Patent vertebral arteries.  Bilateral thyroid nodules.  The left solid nodule meets the criteria for ultrasound guided biopsy.  Consider further evaluation of the thyroid tissue with a dedicated thyroid ultrasound.    Original Report Authenticated By: Richarda Overlie, M.D.     Scheduled Meds: . benazepril  20 mg Oral Daily   And  .  amLODipine  10 mg Oral Daily  . anastrozole  1 mg Oral QHS  . aspirin EC  325 mg Oral Daily  . enoxaparin (LOVENOX) injection  40 mg Subcutaneous Q24H  . ezetimibe  10 mg Oral Daily  . meclizine  25 mg Oral TID  . pantoprazole  40 mg Oral Daily  . sertraline  250 mg Oral Daily  . sodium chloride  3 mL Intravenous Q12H   Continuous Infusions:   Principal Problem:   Ataxia Active Problems:   Vertigo   History of breast cancer   Panic disorder   Thyroid nodule    Time spent:    [facility]  [facility] Pager [phone]. If 7PM-7AM, please contact night-coverage at [email or web address], password [facility] 10/23/2012, 3:08 PM  LOS: 1 day

## 2012-10-24 ENCOUNTER — Inpatient Hospital Stay (HOSPITAL_COMMUNITY): Payer: BC Managed Care – PPO

## 2012-10-24 ENCOUNTER — Encounter (HOSPITAL_COMMUNITY): Payer: Self-pay | Admitting: Radiology

## 2012-10-24 DIAGNOSIS — I059 Rheumatic mitral valve disease, unspecified: Secondary | ICD-10-CM

## 2012-10-24 LAB — URINE CULTURE

## 2012-10-24 MED ORDER — MECLIZINE HCL 25 MG PO TABS: 25.0000 mg | ORAL_TABLET | Freq: Three times a day (TID) | ORAL | Status: DC | PRN

## 2012-10-24 MED ORDER — GADOBENATE DIMEGLUMINE 529 MG/ML IV SOLN
18.0000 mL | Freq: Once | INTRAVENOUS | Status: AC | PRN
  Administered 2012-10-24: 18 mL via INTRAVENOUS

## 2012-10-24 MED ORDER — ALPRAZOLAM 2 MG PO TABS: 1.0000 mg | ORAL_TABLET | Freq: Four times a day (QID) | ORAL | Status: DC

## 2012-10-24 NOTE — Progress Notes (Signed)
*  PRELIMINARY RESULTS* Echocardiogram 2D Echocardiogram has been performed.  Claudia [name] [place] 10/24/2012, 2:35 PM

## 2012-10-24 NOTE — Progress Notes (Signed)
Nutrition Brief Note  Patient identified on the Malnutrition Screening Tool (MST) Report  Body mass index is 33.93 kg/(m^2). Patient meets criteria for Obesity Class I based on current BMI. Denies recent wt loss.  Wt Readings from Last 10 Encounters:  10/22/12 197 lb 12 oz (89.7 kg)  08/30/12 192 lb (87.091 kg)   Current diet order is Heart Healthy, patient is consuming approximately >75% of meals at this time. Labs and medications reviewed.   No nutrition interventions warranted at this time. If nutrition issues arise, please consult RD.   Royann Shivers MS,RD,LDN,CSG Office: [phone] Pager: [phone]

## 2012-10-24 NOTE — Discharge Summary (Signed)
Physician Discharge Summary  [name] AVW:[id number] DOB: Sep 25, 1955 DOA: 10/22/2012  PCP: No primary provider on file.  Admit date: 10/22/2012 Discharge date: 10/24/2012  Time spent: 45 minutes  Recommendations for Outpatient Follow-up:  1. Patient is scheduled to followup with endocrinology regarding further workup of thyroid nodules. 2. Followup with primary care physician in one to 2 weeks  Discharge Diagnoses:  Principal Problem:   Ataxia Active Problems:   Vertigo   History of breast cancer   Panic disorder   Thyroid nodule   Discharge Condition: Improved  Diet recommendation: Low-salt  Filed Weights   10/22/12 1510 10/22/12 2215  Weight: 81.647 kg (180 lb) 89.7 kg (197 lb 12 oz)    History of present illness:  Claudia [name] is a 57 y.o. female with a past medical history of breast cancer, panic disorder, hypertension, who was in her usual state of health at 3 AM this morning when she got up from her couch and felt unsteady on her gait. She got really dizzy, and started falling around, but did not really fall down. She had blurred vision followed by onset of anxiety. She took her medications, that she normally takes and took aspirin. She sat back down. And, then she noted that whenever she would move her head she would feel the dizziness. The dizziness was described as if the room was spinning around her head. She subsequently went back to sleep and when she woke up she again, she felt lightheaded. Denies any ear pain, however, has been hanging having a ringing sensation in both her ears. Denies any discharge from her ears. She said about a week ago she was feeling weak and her arms felt heavy. She felt chills, but denies any fever. Denies any weakness on any one side of the body. She denies leaning towards any one side when she was ambulating. The symptoms concerned her so, she came in to the hospital.   Hospital Course:  This lady was admitted to the hospital with  dizziness. She was monitored on telemetry. MRI of the brain did not show any acute process. Carotid Dopplers were unremarkable. 2-D echocardiogram was also negative. Lipid panel showed a mildly elevated LDL, the patient reports an allergy to statin and therefore she is taking Zetia and fish oil. She was evaluated by physical therapy and did not require any further outpatient physical therapy. She reports that her dizziness significantly improved after initiation of meclizine. Orthostatics were checked and were also found to be negative. It is likely that her dizziness was secondary to benign positional vertigo, which has since improved.  Patient was incidentally found to have thyroid nodules on carotid Dopplers. She has been referred to endocrinology as an outpatient regarding further workup. TSH was found to be in normal range.  Patient did have an elevated INR here in the hospital. This was repeated and found to be normal. Abnormal value was likely a lab error.  Patient does not require any further inpatient workup. She'll be discharged home.  Procedures: Echo:Left ventricle: The cavity size was normal. Wall thickness was at the upper limits of normal. Systolic function was normal. The estimated ejection fraction was in the range of 55% to 60%. Wall motion was normal; there were no regional wall motion abnormalities. Features are consistent with a pseudonormal left ventricular filling pattern, with concomitant abnormal relaxation and increased filling pressure (grade 2 diastolic dysfunction). No previous study for comparison. - Mitral valve: Mild regurgitation. - Right ventricle: The moderator band  was prominent. - Right atrium: Central venous pressure: 5mm Hg (est). - Tricuspid valve: Mild regurgitation. - Pulmonary arteries: PA peak pressure: 29mm Hg (S). - Pericardium, extracardiac: There was no pericardial effusion.     Consultations:  none  Discharge Exam: Filed Vitals:    10/23/12 0605 10/23/12 2117 10/24/12 0520 10/24/12 1252  BP: 105/65 118/66 122/76 138/83  Pulse: 75 78 71 67  Temp: 97.9 F (36.6 C) 97.9 F (36.6 C) 98.2 F (36.8 C) 98.3 F (36.8 C)  TempSrc:  Oral Oral   Resp: 20 20 18 18   Height:      Weight:      SpO2: 96% 98% 94% 99%    General: No acute distress Cardiovascular: S1, S2, regular rate and rhythm Respiratory: Clear to auscultation bilaterally  Discharge Instructions  Discharge Orders   Future Orders Complete By Expires     Call MD for:  extreme fatigue  As directed     Call MD for:  persistant dizziness or light-headedness  As directed     Call MD for:  temperature >100.4  As directed     Diet - low sodium heart healthy  As directed     Increase activity slowly  As directed         Medication List    TAKE these medications       alprazolam 2 MG tablet  Commonly known as:  XANAX  Take 0.5 tablets (1 mg total) by mouth 4 (four) times daily.     amLODipine-benazepril 10-20 MG per capsule  Commonly known as:  LOTREL  Take 1 capsule by mouth at bedtime.     anastrozole 1 MG tablet  Commonly known as:  ARIMIDEX  Take 1 tablet (1 mg total) by mouth at bedtime.     cholecalciferol 400 UNITS Tabs  Commonly known as:  VITAMIN D  Take 400 Units by mouth 2 (two) times daily.     ezetimibe 10 MG tablet  Commonly known as:  ZETIA  Take 1 tablet (10 mg total) by mouth daily.     fish oil-omega-3 fatty acids 1000 MG capsule  Take 2 capsules (2 g total) by mouth daily.     HYDROcodone-acetaminophen 10-325 MG per tablet  Commonly known as:  NORCO  Take 1 tablet by mouth 3 (three) times daily as needed for pain.     hydrocortisone 2.5 % cream  Apply 1 application topically See admin instructions. 2-3 times daily as needed for eczema on face     meclizine 25 MG tablet  Commonly known as:  ANTIVERT  Take 1 tablet (25 mg total) by mouth 3 (three) times daily as needed for dizziness.     omeprazole 20 MG capsule   Commonly known as:  PRILOSEC  Take 20 mg by mouth daily as needed. For acid reflux     sertraline 100 MG tablet  Commonly known as:  ZOLOFT  Take 250 mg by mouth daily.           Follow-up Information   Follow up with NIDA,GEBRESELASSIE, MD On 11/02/2012. (at 10:00 am)    Contact information:   [address] [place] [address] [phone]       Follow up with primary care doctor in 2 weeks.       The results of significant diagnostics from this hospitalization (including imaging, microbiology, ancillary and laboratory) are listed below for reference.    Significant Diagnostic Studies: Ct Head Wo Contrast  10/22/2012  *  RADIOLOGY REPORT*  Clinical Data: Dizziness.  Extremity weakness.  CT HEAD WITHOUT CONTRAST  Technique:  Contiguous axial images were obtained from the base of the skull through the vertex without contrast.  Comparison: None.  Findings: No acute cortical infarct, hemorrhage, mass lesion is present.  Chronic left maxillary sinus opacification is evident. Minimal left ethmoid sinus disease is present.  The paranasal sinuses and mastoid air cells are otherwise clear.  The osseous skull is intact.  IMPRESSION:  1.  Normal CT appearance of the brain. 2.  Chronic left maxillary sinus disease.   Original Report Authenticated By: Marin Roberts, M.D.    Mr Laqueta Jean BJ Contrast  10/24/2012  *RADIOLOGY REPORT*  Clinical Data: Ataxia.  Vertigo.  MRI HEAD WITHOUT AND WITH CONTRAST  Technique:  Multiplanar, multiecho pulse sequences of the brain and surrounding structures were obtained according to standard protocol without and with intravenous contrast  Contrast: 18mL MULTIHANCE GADOBENATE DIMEGLUMINE 529 MG/ML IV SOLN  Comparison: Head CT 10/22/2012  Findings: The brain has a normal appearance without evidence of malformation, old or acute infarction, mass lesion, hemorrhage, hydrocephalus or extra-axial collection.  No pituitary mass.  There is mucosal thickening  and fluid within the left maxillary sinus suggesting acute sinusitis.  The other sinuses are clear.  No fluid in the middle ears or mastoids. After contrast administration, no abnormal enhancement occurs.  IMPRESSION: Normal appearance of the brain itself.  No cause of the described symptoms is identified.  Probable left maxillary sinusitis.   Original Report Authenticated By: Paulina Fusi, M.D.    US Carotid Duplex Bilateral  10/23/2012  *RADIOLOGY REPORT*  Clinical Data: Ataxia.  BILATERAL CAROTID DUPLEX ULTRASOUND  Technique: Wallace Cullens scale imaging, color Doppler and duplex ultrasound was performed of bilateral carotid and vertebral arteries in the neck.  Comparison:  None.  Criteria:  Quantification of carotid stenosis is based on velocity parameters that correlate the residual internal carotid diameter with NASCET-based stenosis levels, using the diameter of the distal internal carotid lumen as the denominator for stenosis measurement.  The following velocity measurements were obtained:                   PEAK SYSTOLIC/END DIASTOLIC RIGHT ICA:                        71cm/sec CCA:                        72cm/sec SYSTOLIC ICA/CCA RATIO:     1.0 DIASTOLIC ICA/CCA RATIO:    1.1 ECA:                        80cm/sec  LEFT ICA:                        73cm/sec CCA:                        79cm/sec SYSTOLIC ICA/CCA RATIO:     0.9 DIASTOLIC ICA/CCA RATIO:    1.4 ECA:                        67cm/sec  Findings:  RIGHT CAROTID ARTERY: Intimal thickening in the right common carotid artery.  Normal waveforms and velocities in the right internal carotid artery.  RIGHT VERTEBRAL ARTERY:  Antegrade flow and normal waveform in the right  vertebral artery.  LEFT CAROTID ARTERY: Mild plaque and wall thickening at the left carotid bulb.  Small amount of plaque in the proximal internal carotid artery.  Normal waveforms and velocities within the left internal carotid artery.  LEFT VERTEBRAL ARTERY:  Antegrade flow and normal waveform in  the left vertebral artery.  Other findings:  There is a heterogeneous nodule in the right thyroid lobe that measures 1.5 x 1.1 x 1.1 cm.  There is a solid nodule in the left thyroid lobe that measures 1.3 x 1.6 x 1.3 cm.  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  Estimated degree of stenosis in the internal carotid arteries is less than 50% bilaterally.  Patent vertebral arteries.  Bilateral thyroid nodules.  The left solid nodule meets the criteria for ultrasound guided biopsy.  Consider further evaluation of the thyroid tissue with a dedicated thyroid ultrasound.   Original Report Authenticated By: Richarda Overlie, M.D.     Microbiology: No results found for this or any previous visit (from the past 240 hour(s)).   Labs: Basic Metabolic Panel:  Recent Labs Lab 10/22/12 1734 10/23/12 0516  NA 137 138  K 3.4* 3.9  CL 105 108  CO2 23 24  GLUCOSE 94 96  BUN 12 11  CREATININE 0.51 0.51  CALCIUM 8.8 8.4   Liver Function Tests:  Recent Labs Lab 10/23/12 0516  AST 12  ALT 9  ALKPHOS 73  BILITOT 0.3  PROT 6.3  ALBUMIN 3.0*   No results found for this basename: LIPASE, AMYLASE,  in the last 168 hours No results found for this basename: AMMONIA,  in the last 168 hours CBC:  Recent Labs Lab 10/22/12 1734 10/23/12 0516  WBC 8.8 6.2  NEUTROABS 5.3  --   HGB 12.9 12.3  HCT 37.7 37.0  MCV 90.0 90.5  PLT 204 181   Cardiac Enzymes: No results found for this basename: CKTOTAL, CKMB, CKMBINDEX, TROPONINI,  in the last 168 hours BNP: BNP (last 3 results) No results found for this basename: PROBNP,  in the last 8760 hours CBG: No results found for this basename: GLUCAP,  in the last 168 hours     Signed:  Hairo Garraway  [facility] 10/24/2012, 6:58 PM

## 2012-10-24 NOTE — Evaluation (Signed)
Physical Therapy Evaluation Patient Details Name: KRYSTI HICKLING MRN: [id number] DOB: October 10, 1955 Today's Date: 10/24/2012 Time: 1128-1201 PT Time Calculation (min): 33 min  PT Assessment / Plan / Recommendation Clinical Impression  Pt was seen for evaluation.  She reports no dizziness currently and is at functional baseline with no abnormalities.  No PT should be needed.    PT Assessment  Patent does not need any further PT services    Follow Up Recommendations  No PT follow up    Does the patient have the potential to tolerate intense rehabilitation      Barriers to Discharge        Equipment Recommendations  None recommended by PT    Recommendations for Other Services     Frequency      Precautions / Restrictions Precautions Precautions: None Restrictions Weight Bearing Restrictions: No   Pertinent Vitals/Pain       Mobility  Bed Mobility Bed Mobility: Supine to Sit;Sit to Supine Supine to Sit: 7: Independent Sit to Supine: 7: Independent Transfers Transfers: Sit to Stand;Stand to Sit Sit to Stand: 7: Independent Stand to Sit: 7: Independent Ambulation/Gait Ambulation/Gait Assistance: 7: Independent Ambulation Distance (Feet): 250 Feet Assistive device: None Gait Pattern: Within Functional Limits Gait velocity: WNL    Exercises     PT Diagnosis:    PT Problem List:   PT Treatment Interventions:     PT Goals    Visit Information  Last PT Received On: 10/24/12    Subjective Data  Subjective: Pt has no dizziness at this time...she states that she is feeling much better since admission, but still has some intermittent mild dizziness...pt states that at work she occsionally has episodes where when she reaches down to the floor and then stands up she will become extremely  dizzy almost to the point of passing out Patient Stated Goal: return home   Prior Functioning  Home Living Lives With: Family Available Help at Discharge: Family Type of Home:  House Home Access: Level entry Home Layout: One level Firefighter: Standard Home Adaptive Equipment: None Prior Function Level of Independence: Independent Able to Take Stairs?: Yes Driving: Yes Vocation: Full time employment Communication Communication: No difficulties    Cognition  Cognition Overall Cognitive Status: Appears within functional limits for tasks assessed/performed Arousal/Alertness: Awake/alert Orientation Level: Appears intact for tasks assessed Behavior During Session: [facility] for tasks performed    Extremity/Trunk Assessment Right Lower Extremity Assessment RLE ROM/Strength/Tone: Within functional levels RLE Sensation: WFL - Light Touch RLE Coordination: WFL - gross motor Left Lower Extremity Assessment LLE ROM/Strength/Tone: Within functional levels LLE Sensation: WFL - Light Touch LLE Coordination: WFL - gross motor   Balance Balance Balance Assessed: Yes High Level Balance High Level Balance Comments: no balance dysfunction  End of Session PT - End of Session Equipment Utilized During Treatment: Gait belt Activity Tolerance: Patient tolerated treatment well Patient left: in bed Nurse Communication: Mobility status  GP     Konrad Penta 10/24/2012, 12:08 PM

## 2012-10-24 NOTE — Progress Notes (Signed)
Patient discharged with instructions, care notes, and work return note.  She verbalizes understanding.  Pt left the floor via w/c with staff in stable condition.

## 2012-10-25 NOTE — Progress Notes (Signed)
UR Chart Review Completed  

## 2012-11-02 ENCOUNTER — Other Ambulatory Visit (HOSPITAL_COMMUNITY): Payer: Self-pay | Admitting: "Endocrinology

## 2012-11-02 DIAGNOSIS — E049 Nontoxic goiter, unspecified: Secondary | ICD-10-CM

## 2012-11-04 ENCOUNTER — Ambulatory Visit (HOSPITAL_COMMUNITY): Payer: BC Managed Care – PPO

## 2012-11-08 ENCOUNTER — Ambulatory Visit (HOSPITAL_COMMUNITY)
Admission: RE | Admit: 2012-11-08 | Discharge: 2012-11-08 | Disposition: A | Discharge status: Home / Self Care | Payer: BC Managed Care – PPO | Source: Ambulatory Visit | Attending: "Endocrinology | Admitting: "Endocrinology

## 2012-11-08 DIAGNOSIS — E049 Nontoxic goiter, unspecified: Secondary | ICD-10-CM

## 2012-11-08 DIAGNOSIS — E041 Nontoxic single thyroid nodule: Secondary | ICD-10-CM | POA: Insufficient documentation

## 2012-11-11 ENCOUNTER — Other Ambulatory Visit (HOSPITAL_COMMUNITY): Payer: Self-pay | Admitting: "Endocrinology

## 2012-11-11 DIAGNOSIS — E041 Nontoxic single thyroid nodule: Secondary | ICD-10-CM

## 2012-11-17 ENCOUNTER — Other Ambulatory Visit (HOSPITAL_COMMUNITY): Payer: Self-pay

## 2012-11-17 ENCOUNTER — Other Ambulatory Visit (HOSPITAL_COMMUNITY): Payer: Self-pay | Admitting: "Endocrinology

## 2012-11-17 ENCOUNTER — Ambulatory Visit (HOSPITAL_COMMUNITY)
Admission: RE | Admit: 2012-11-17 | Discharge: 2012-11-17 | Disposition: A | Discharge status: Home / Self Care | Payer: BC Managed Care – PPO | Source: Ambulatory Visit | Attending: "Endocrinology | Admitting: "Endocrinology

## 2012-11-17 DIAGNOSIS — E041 Nontoxic single thyroid nodule: Secondary | ICD-10-CM

## 2012-11-17 DIAGNOSIS — E042 Nontoxic multinodular goiter: Secondary | ICD-10-CM | POA: Insufficient documentation

## 2012-11-17 NOTE — Progress Notes (Signed)
Lidocaine 2%             3mL injected                   Bilateral thyroid biopsies performed

## 2012-11-17 NOTE — Procedures (Signed)
PreOperative Dx: Bilateral thyroid nodules Postoperative Dx: Bilateral thyroid nodules Procedure:   US guided FNA of bilateral thyroid nodules Radiologist:  Tyron Russell Anesthesia:  2 ml of 2% lidocaine Specimen:  FNA x 4 RIGHT, FNA x 3 LEFT  EBL:   None Complications: None

## 2013-01-05 ENCOUNTER — Encounter: Payer: Self-pay | Admitting: Internal Medicine

## 2013-01-05 DIAGNOSIS — F411 Generalized anxiety disorder: Secondary | ICD-10-CM

## 2013-01-05 DIAGNOSIS — C50919 Malignant neoplasm of unspecified site of unspecified female breast: Secondary | ICD-10-CM

## 2013-04-24 ENCOUNTER — Other Ambulatory Visit (HOSPITAL_COMMUNITY): Payer: Self-pay | Admitting: "Endocrinology

## 2013-04-24 DIAGNOSIS — E042 Nontoxic multinodular goiter: Secondary | ICD-10-CM

## 2013-04-26 ENCOUNTER — Ambulatory Visit (HOSPITAL_COMMUNITY)
Admission: RE | Admit: 2013-04-26 | Discharge: 2013-04-26 | Disposition: A | Discharge status: Home / Self Care | Payer: BC Managed Care – PPO | Source: Ambulatory Visit | Attending: "Endocrinology | Admitting: "Endocrinology

## 2013-04-26 DIAGNOSIS — E042 Nontoxic multinodular goiter: Secondary | ICD-10-CM

## 2013-10-10 ENCOUNTER — Encounter (HOSPITAL_COMMUNITY)
Admission: EM | Disposition: A | Discharge status: Home Health | Payer: Self-pay | Source: Home / Self Care | Attending: Orthopaedic Surgery

## 2013-10-10 ENCOUNTER — Emergency Department (HOSPITAL_COMMUNITY): Payer: BC Managed Care – PPO | Admitting: Anesthesiology

## 2013-10-10 ENCOUNTER — Emergency Department (HOSPITAL_COMMUNITY): Payer: BC Managed Care – PPO

## 2013-10-10 ENCOUNTER — Inpatient Hospital Stay (HOSPITAL_COMMUNITY)
Admission: EM | Admit: 2013-10-10 | Discharge: 2013-10-12 | DRG: 494 | Disposition: A | Discharge status: Home Health | Payer: BC Managed Care – PPO | Attending: Orthopaedic Surgery | Admitting: Orthopaedic Surgery

## 2013-10-10 ENCOUNTER — Encounter (HOSPITAL_COMMUNITY): Payer: BC Managed Care – PPO | Admitting: Anesthesiology

## 2013-10-10 ENCOUNTER — Encounter (HOSPITAL_COMMUNITY): Payer: Self-pay | Admitting: Emergency Medicine

## 2013-10-10 DIAGNOSIS — K219 Gastro-esophageal reflux disease without esophagitis: Secondary | ICD-10-CM | POA: Diagnosis present

## 2013-10-10 DIAGNOSIS — K589 Irritable bowel syndrome without diarrhea: Secondary | ICD-10-CM | POA: Diagnosis present

## 2013-10-10 DIAGNOSIS — Z9010 Acquired absence of unspecified breast and nipple: Secondary | ICD-10-CM

## 2013-10-10 DIAGNOSIS — W010XXA Fall on same level from slipping, tripping and stumbling without subsequent striking against object, initial encounter: Secondary | ICD-10-CM | POA: Diagnosis present

## 2013-10-10 DIAGNOSIS — F410 Panic disorder [episodic paroxysmal anxiety] without agoraphobia: Secondary | ICD-10-CM | POA: Diagnosis present

## 2013-10-10 DIAGNOSIS — Z853 Personal history of malignant neoplasm of breast: Secondary | ICD-10-CM

## 2013-10-10 DIAGNOSIS — S82852A Displaced trimalleolar fracture of left lower leg, initial encounter for closed fracture: Secondary | ICD-10-CM

## 2013-10-10 DIAGNOSIS — Y92009 Unspecified place in unspecified non-institutional (private) residence as the place of occurrence of the external cause: Secondary | ICD-10-CM

## 2013-10-10 DIAGNOSIS — F3289 Other specified depressive episodes: Secondary | ICD-10-CM

## 2013-10-10 DIAGNOSIS — F419 Anxiety disorder, unspecified: Secondary | ICD-10-CM

## 2013-10-10 DIAGNOSIS — F411 Generalized anxiety disorder: Secondary | ICD-10-CM | POA: Diagnosis present

## 2013-10-10 DIAGNOSIS — F329 Major depressive disorder, single episode, unspecified: Secondary | ICD-10-CM

## 2013-10-10 DIAGNOSIS — E7800 Pure hypercholesterolemia, unspecified: Secondary | ICD-10-CM | POA: Diagnosis present

## 2013-10-10 DIAGNOSIS — I10 Essential (primary) hypertension: Secondary | ICD-10-CM | POA: Diagnosis present

## 2013-10-10 DIAGNOSIS — S82853A Displaced trimalleolar fracture of unspecified lower leg, initial encounter for closed fracture: Principal | ICD-10-CM

## 2013-10-10 DIAGNOSIS — F17200 Nicotine dependence, unspecified, uncomplicated: Secondary | ICD-10-CM | POA: Diagnosis present

## 2013-10-10 DIAGNOSIS — F32A Depression, unspecified: Secondary | ICD-10-CM

## 2013-10-10 HISTORY — PX: ORIF ANKLE FRACTURE: SHX5408

## 2013-10-10 LAB — BASIC METABOLIC PANEL
BUN: 13 mg/dL (ref 6–23)
CALCIUM: 10.2 mg/dL (ref 8.4–10.5)
CO2: 26 mEq/L (ref 19–32)
Chloride: 104 mEq/L (ref 96–112)
Creatinine, Ser: 0.65 mg/dL (ref 0.50–1.10)
GLUCOSE: 106 mg/dL — AB (ref 70–99)
Potassium: 4 mEq/L (ref 3.7–5.3)
SODIUM: 142 meq/L (ref 137–147)

## 2013-10-10 LAB — CBC WITH DIFFERENTIAL/PLATELET
BASOS PCT: 0 % (ref 0–1)
Basophils Absolute: 0 10*3/uL (ref 0.0–0.1)
EOS ABS: 0.1 10*3/uL (ref 0.0–0.7)
Eosinophils Relative: 1 % (ref 0–5)
HCT: 42.3 % (ref 36.0–46.0)
Hemoglobin: 14.4 g/dL (ref 12.0–15.0)
Lymphocytes Relative: 23 % (ref 12–46)
Lymphs Abs: 1.9 10*3/uL (ref 0.7–4.0)
MCH: 31 pg (ref 26.0–34.0)
MCHC: 34 g/dL (ref 30.0–36.0)
MCV: 91.2 fL (ref 78.0–100.0)
Monocytes Absolute: 0.5 10*3/uL (ref 0.1–1.0)
Monocytes Relative: 6 % (ref 3–12)
NEUTROS ABS: 5.6 10*3/uL (ref 1.7–7.7)
Neutrophils Relative %: 69 % (ref 43–77)
PLATELETS: 186 10*3/uL (ref 150–400)
RBC: 4.64 MIL/uL (ref 3.87–5.11)
RDW: 13 % (ref 11.5–15.5)
WBC: 8.2 10*3/uL (ref 4.0–10.5)

## 2013-10-10 SURGERY — OPEN REDUCTION INTERNAL FIXATION (ORIF) ANKLE FRACTURE
Anesthesia: General | Site: Ankle | Laterality: Left

## 2013-10-10 MED ORDER — PANTOPRAZOLE SODIUM 40 MG PO TBEC
40.0000 mg | DELAYED_RELEASE_TABLET | Freq: Every day | ORAL | Status: DC
  Administered 2013-10-11 – 2013-10-12 (×2): 40 mg via ORAL
  Filled 2013-10-10 (×2): qty 1

## 2013-10-10 MED ORDER — OMEGA-3-ACID ETHYL ESTERS 1 G PO CAPS
2.0000 g | ORAL_CAPSULE | Freq: Every day | ORAL | Status: DC
  Administered 2013-10-10 – 2013-10-12 (×3): 2 g via ORAL
  Filled 2013-10-10 (×3): qty 2

## 2013-10-10 MED ORDER — CEFAZOLIN SODIUM-DEXTROSE 2-3 GM-% IV SOLR
2.0000 g | Freq: Once | INTRAVENOUS | Status: AC
  Administered 2013-10-10: 2 g via INTRAVENOUS
  Filled 2013-10-10: qty 50

## 2013-10-10 MED ORDER — MORPHINE SULFATE 10 MG/ML IJ SOLN
INTRAMUSCULAR | Status: AC
  Administered 2013-10-10: 8 mg
  Filled 2013-10-10: qty 1

## 2013-10-10 MED ORDER — ARIPIPRAZOLE 10 MG PO TABS
5.0000 mg | ORAL_TABLET | Freq: Every day | ORAL | Status: DC
  Administered 2013-10-11 – 2013-10-12 (×2): 5 mg via ORAL
  Filled 2013-10-10 (×2): qty 1

## 2013-10-10 MED ORDER — LIDOCAINE HCL (CARDIAC) 20 MG/ML IV SOLN
INTRAVENOUS | Status: DC | PRN
  Administered 2013-10-10: 50 mg via INTRAVENOUS

## 2013-10-10 MED ORDER — FENTANYL CITRATE 0.05 MG/ML IJ SOLN
INTRAMUSCULAR | Status: AC
  Filled 2013-10-10: qty 2

## 2013-10-10 MED ORDER — FENTANYL CITRATE 0.05 MG/ML IJ SOLN
INTRAMUSCULAR | Status: AC
  Filled 2013-10-10: qty 5

## 2013-10-10 MED ORDER — ONDANSETRON HCL 4 MG/2ML IJ SOLN
4.0000 mg | Freq: Once | INTRAMUSCULAR | Status: AC
  Administered 2013-10-10: 4 mg via INTRAVENOUS

## 2013-10-10 MED ORDER — FENTANYL CITRATE 0.05 MG/ML IJ SOLN
25.0000 ug | INTRAMUSCULAR | Status: DC
  Administered 2013-10-10: 25 ug via INTRAVENOUS

## 2013-10-10 MED ORDER — ALPRAZOLAM 1 MG PO TABS
2.0000 mg | ORAL_TABLET | Freq: Four times a day (QID) | ORAL | Status: DC
  Administered 2013-10-10 – 2013-10-12 (×6): 2 mg via ORAL
  Filled 2013-10-10 (×6): qty 2

## 2013-10-10 MED ORDER — MORPHINE SULFATE (PF) 1 MG/ML IV SOLN
INTRAVENOUS | Status: DC
  Administered 2013-10-10: 15:00:00 via INTRAVENOUS
  Administered 2013-10-11: 1.5 mg via INTRAVENOUS
  Administered 2013-10-11: 2.24 mg via INTRAVENOUS
  Administered 2013-10-11: 12 mg via INTRAVENOUS
  Administered 2013-10-12: 16.5 mg via INTRAVENOUS
  Filled 2013-10-10 (×2): qty 25

## 2013-10-10 MED ORDER — ASPIRIN EC 81 MG PO TBEC: 81.0000 mg | DELAYED_RELEASE_TABLET | Freq: Every day | ORAL | Status: DC | PRN

## 2013-10-10 MED ORDER — SUCCINYLCHOLINE CHLORIDE 20 MG/ML IJ SOLN
INTRAMUSCULAR | Status: DC | PRN
  Administered 2013-10-10: 120 mg via INTRAVENOUS

## 2013-10-10 MED ORDER — FENTANYL CITRATE 0.05 MG/ML IJ SOLN
INTRAMUSCULAR | Status: DC | PRN
  Administered 2013-10-10 (×5): 50 ug via INTRAVENOUS

## 2013-10-10 MED ORDER — ENOXAPARIN SODIUM 40 MG/0.4ML ~~LOC~~ SOLN
40.0000 mg | SUBCUTANEOUS | Status: DC
  Administered 2013-10-11 – 2013-10-12 (×2): 40 mg via SUBCUTANEOUS
  Filled 2013-10-10 (×2): qty 0.4

## 2013-10-10 MED ORDER — EZETIMIBE 10 MG PO TABS
10.0000 mg | ORAL_TABLET | Freq: Every day | ORAL | Status: DC
Start: 2013-10-10 — End: 2013-10-12
  Administered 2013-10-10 – 2013-10-11 (×2): 10 mg via ORAL
  Filled 2013-10-10 (×2): qty 1

## 2013-10-10 MED ORDER — HYDROCORTISONE 2.5 % EX CREA: 1.0000 "application " | TOPICAL_CREAM | Freq: Three times a day (TID) | CUTANEOUS | Status: DC | PRN

## 2013-10-10 MED ORDER — SODIUM CHLORIDE 0.9 % IJ SOLN: 9.0000 mL | INTRAMUSCULAR | Status: DC | PRN

## 2013-10-10 MED ORDER — ONDANSETRON HCL 4 MG/2ML IJ SOLN
INTRAMUSCULAR | Status: AC
  Filled 2013-10-10: qty 2

## 2013-10-10 MED ORDER — DIPHENHYDRAMINE HCL 50 MG/ML IJ SOLN: 12.5000 mg | Freq: Four times a day (QID) | INTRAMUSCULAR | Status: DC | PRN

## 2013-10-10 MED ORDER — MORPHINE SULFATE 4 MG/ML IJ SOLN
4.0000 mg | Freq: Once | INTRAMUSCULAR | Status: AC
  Administered 2013-10-10: 4 mg via INTRAVENOUS

## 2013-10-10 MED ORDER — FENTANYL CITRATE 0.05 MG/ML IJ SOLN
25.0000 ug | INTRAMUSCULAR | Status: DC | PRN
  Administered 2013-10-10 (×4): 50 ug via INTRAVENOUS

## 2013-10-10 MED ORDER — SERTRALINE HCL 50 MG PO TABS
200.0000 mg | ORAL_TABLET | Freq: Every day | ORAL | Status: DC
  Administered 2013-10-11 – 2013-10-12 (×2): 200 mg via ORAL
  Filled 2013-10-10 (×2): qty 4

## 2013-10-10 MED ORDER — LACTATED RINGERS IV SOLN
INTRAVENOUS | Status: DC | PRN
  Administered 2013-10-10 (×2): via INTRAVENOUS

## 2013-10-10 MED ORDER — PROPOFOL 10 MG/ML IV BOLUS
INTRAVENOUS | Status: AC
  Administered 2013-10-10: 50 mg
  Filled 2013-10-10: qty 1

## 2013-10-10 MED ORDER — ONDANSETRON HCL 4 MG/2ML IJ SOLN: 4.0000 mg | Freq: Once | INTRAMUSCULAR | Status: AC

## 2013-10-10 MED ORDER — PROMETHAZINE HCL 25 MG/ML IJ SOLN: 12.5000 mg | INTRAMUSCULAR | Status: DC | PRN

## 2013-10-10 MED ORDER — ENSURE COMPLETE PO LIQD: 237.0000 mL | Freq: Two times a day (BID) | ORAL | Status: DC | PRN

## 2013-10-10 MED ORDER — SODIUM CHLORIDE 0.9 % IV SOLN
Freq: Once | INTRAVENOUS | Status: AC
  Administered 2013-10-10: 08:00:00 via INTRAVENOUS

## 2013-10-10 MED ORDER — ENSURE COMPLETE PO LIQD
237.0000 mL | ORAL | Status: DC
  Administered 2013-10-10: 237 mL via ORAL

## 2013-10-10 MED ORDER — MORPHINE SULFATE 4 MG/ML IJ SOLN: 8.0000 mg | Freq: Once | INTRAMUSCULAR | Status: AC

## 2013-10-10 MED ORDER — PROPOFOL 10 MG/ML IV EMUL
INTRAVENOUS | Status: AC
  Filled 2013-10-10: qty 20

## 2013-10-10 MED ORDER — ALBUTEROL SULFATE (2.5 MG/3ML) 0.083% IN NEBU
2.5000 mg | INHALATION_SOLUTION | Freq: Four times a day (QID) | RESPIRATORY_TRACT | Status: DC
  Administered 2013-10-10: 2.5 mg via RESPIRATORY_TRACT
  Filled 2013-10-10: qty 3

## 2013-10-10 MED ORDER — NALOXONE HCL 0.4 MG/ML IJ SOLN: 0.4000 mg | INTRAMUSCULAR | Status: DC | PRN

## 2013-10-10 MED ORDER — DEXTROSE-NACL 5-0.45 % IV SOLN
INTRAVENOUS | Status: DC
  Administered 2013-10-10 – 2013-10-11 (×3): via INTRAVENOUS

## 2013-10-10 MED ORDER — DEXAMETHASONE SODIUM PHOSPHATE 4 MG/ML IJ SOLN
INTRAMUSCULAR | Status: AC
  Filled 2013-10-10: qty 1

## 2013-10-10 MED ORDER — MIDAZOLAM HCL 2 MG/2ML IJ SOLN
1.0000 mg | INTRAMUSCULAR | Status: DC | PRN
  Administered 2013-10-10: 2 mg via INTRAVENOUS

## 2013-10-10 MED ORDER — ONDANSETRON HCL 4 MG/2ML IJ SOLN
4.0000 mg | Freq: Once | INTRAMUSCULAR | Status: AC
  Administered 2013-10-10: 4 mg via INTRAVENOUS
  Filled 2013-10-10: qty 2

## 2013-10-10 MED ORDER — ONDANSETRON HCL 4 MG/2ML IJ SOLN: 4.0000 mg | Freq: Once | INTRAMUSCULAR | Status: DC | PRN

## 2013-10-10 MED ORDER — MAGNESIUM HYDROXIDE 400 MG/5ML PO SUSP: 30.0000 mL | Freq: Every day | ORAL | Status: DC | PRN

## 2013-10-10 MED ORDER — SODIUM CHLORIDE 0.9 % IR SOLN
Status: DC | PRN
  Administered 2013-10-10: 1000 mL

## 2013-10-10 MED ORDER — MORPHINE SULFATE 2 MG/ML IJ SOLN
INTRAMUSCULAR | Status: AC
  Filled 2013-10-10: qty 2

## 2013-10-10 MED ORDER — DIPHENHYDRAMINE HCL 12.5 MG/5ML PO ELIX
12.5000 mg | ORAL_SOLUTION | Freq: Four times a day (QID) | ORAL | Status: DC | PRN
Start: 2013-10-10 — End: 2013-10-12

## 2013-10-10 MED ORDER — PROPOFOL 10 MG/ML IV BOLUS
INTRAVENOUS | Status: DC | PRN
  Administered 2013-10-10: 150 mg via INTRAVENOUS

## 2013-10-10 MED ORDER — HYDROCORTISONE 2.5 % EX CREA
1.0000 "application " | TOPICAL_CREAM | Freq: Three times a day (TID) | CUTANEOUS | Status: DC
  Filled 2013-10-10: qty 28

## 2013-10-10 MED ORDER — MIDAZOLAM HCL 2 MG/2ML IJ SOLN
INTRAMUSCULAR | Status: AC
  Filled 2013-10-10: qty 2

## 2013-10-10 MED ORDER — DEXAMETHASONE SODIUM PHOSPHATE 4 MG/ML IJ SOLN
4.0000 mg | Freq: Once | INTRAMUSCULAR | Status: AC
  Administered 2013-10-10: 4 mg via INTRAVENOUS

## 2013-10-10 MED ORDER — ZOLPIDEM TARTRATE 5 MG PO TABS
5.0000 mg | ORAL_TABLET | Freq: Every day | ORAL | Status: DC
  Filled 2013-10-10: qty 1

## 2013-10-10 MED ORDER — ONDANSETRON HCL 4 MG/2ML IJ SOLN: 4.0000 mg | Freq: Four times a day (QID) | INTRAMUSCULAR | Status: DC | PRN

## 2013-10-10 MED ORDER — EZETIMIBE 10 MG PO TABS: 10.0000 mg | ORAL_TABLET | Freq: Every day | ORAL | Status: DC

## 2013-10-10 MED ORDER — ALBUTEROL SULFATE (2.5 MG/3ML) 0.083% IN NEBU: 2.5000 mg | INHALATION_SOLUTION | RESPIRATORY_TRACT | Status: DC | PRN

## 2013-10-10 MED ORDER — LACTATED RINGERS IV SOLN
INTRAVENOUS | Status: DC
  Administered 2013-10-10: 10:00:00 via INTRAVENOUS

## 2013-10-10 SURGICAL SUPPLY — 61 items
BAG HAMPER (MISCELLANEOUS) ×3 IMPLANT
BANDAGE ELASTIC 4 VELCRO NS (GAUZE/BANDAGES/DRESSINGS) ×3 IMPLANT
BANDAGE ELASTIC 6 VELCRO NS (GAUZE/BANDAGES/DRESSINGS) IMPLANT
BANDAGE ESMARK 4X12 BL STRL LF (DISPOSABLE) ×1 IMPLANT
BIT DRILL 2.5X110 QC LCP DISP (BIT) ×3 IMPLANT
BIT DRILL 2.8 (BIT) ×1
BIT DRILL 3.2MM (BIT) ×1
BIT DRILL CANN QC 2.8X165 (BIT) ×1 IMPLANT
BIT DRILL JC END 3.2X130 (BIT) ×2 IMPLANT
BLADE SURG 15 STRL LF DISP TIS (BLADE) ×1 IMPLANT
BLADE SURG 15 STRL SS (BLADE) ×2
BLADE SURG SZ10 CARB STEEL (BLADE) ×3 IMPLANT
BNDG ESMARK 4X12 BLUE STRL LF (DISPOSABLE) ×3
CLOTH BEACON ORANGE TIMEOUT ST (SAFETY) ×3 IMPLANT
COOLER CRYO IC GRAV AND TUBE (ORTHOPEDIC SUPPLIES) ×3 IMPLANT
COVER LIGHT HANDLE STERIS (MISCELLANEOUS) ×6 IMPLANT
COVER MAYO STAND XLG (DRAPE) ×3 IMPLANT
CUFF CRYO ANKLE (ORTHOPEDIC SUPPLIES) ×3 IMPLANT
CUFF TOURNIQUET SINGLE 34IN LL (TOURNIQUET CUFF) ×3 IMPLANT
DRAPE C-ARM FOLDED MOBILE STRL (DRAPES) ×3 IMPLANT
DRILL BIT 2.8MM (BIT) ×2
DURAPREP 26ML APPLICATOR (WOUND CARE) ×3 IMPLANT
GAUZE XEROFORM 5X9 LF (GAUZE/BANDAGES/DRESSINGS) ×3 IMPLANT
GLOVE BIO SURGEON STRL SZ8 (GLOVE) ×3 IMPLANT
GLOVE BIO SURGEON STRL SZ8.5 (GLOVE) ×3 IMPLANT
GOWN STRL REUS W/TWL LRG LVL3 (GOWN DISPOSABLE) ×6 IMPLANT
GOWN STRL REUS W/TWL XL LVL3 (GOWN DISPOSABLE) ×3 IMPLANT
INST SET MINOR BONE (KITS) ×3 IMPLANT
K-WIRE 1.6 (WIRE) ×2
K-WIRE FX5X1.6XNS BN SS (WIRE) ×1
KIT ROOM TURNOVER APOR (KITS) ×3 IMPLANT
KWIRE FX5X1.6XNS BN SS (WIRE) ×1 IMPLANT
MANIFOLD NEPTUNE II (INSTRUMENTS) ×3 IMPLANT
MICROAIRE K-WIRE (Wire) ×3 IMPLANT
NS IRRIG 1000ML POUR BTL (IV SOLUTION) ×3 IMPLANT
PACK BASIC LIMB (CUSTOM PROCEDURE TRAY) ×3 IMPLANT
PAD ABD 5X9 TENDERSORB (GAUZE/BANDAGES/DRESSINGS) ×9 IMPLANT
PAD ARMBOARD 7.5X6 YLW CONV (MISCELLANEOUS) ×3 IMPLANT
PAD CAST 4YDX4 CTTN HI CHSV (CAST SUPPLIES) ×1 IMPLANT
PADDING CAST COTTON 4X4 STRL (CAST SUPPLIES) ×2
PADDING WEBRIL 4 STERILE (GAUZE/BANDAGES/DRESSINGS) ×6 IMPLANT
PLATE LCP 3.5 1/3 TUB 6HX69 (Plate) ×3 IMPLANT
SCREW CANCEL 4.0 14MM (Screw) ×3 IMPLANT
SCREW CORTEX 3.5 12MM (Screw) ×2 IMPLANT
SCREW CORTEX 3.5 16MM (Screw) ×2 IMPLANT
SCREW CORTEX 3.5 18MM (Screw) ×4 IMPLANT
SCREW LOCK CORT ST 3.5X12 (Screw) ×1 IMPLANT
SCREW LOCK CORT ST 3.5X16 (Screw) ×1 IMPLANT
SCREW LOCK CORT ST 3.5X18 (Screw) ×2 IMPLANT
SCREW LOCK T15 FT 12X3.5X2.9X (Screw) ×1 IMPLANT
SCREW LOCKING 3.5X12 (Screw) ×2 IMPLANT
SCREW PROS MALLEO 55 26MM (Screw) ×3 IMPLANT
SET BASIN LINEN APH (SET/KITS/TRAYS/PACK) ×3 IMPLANT
SPLINT J 5 (CAST SUPPLIES) ×3 IMPLANT
SPONGE GAUZE 4X4 12PLY (GAUZE/BANDAGES/DRESSINGS) ×6 IMPLANT
SPONGE LAP 18X18 X RAY DECT (DISPOSABLE) ×3 IMPLANT
STAPLER VISISTAT 35W (STAPLE) ×3 IMPLANT
SUT CHROMIC 2 0 CT 1 (SUTURE) ×6 IMPLANT
SUT NUROLON CT 2 BLK #1 18IN (SUTURE) IMPLANT
SUT PLAIN 2 0 XLH (SUTURE) ×6 IMPLANT
TOWEL OR 17X26 4PK STRL BLUE (TOWEL DISPOSABLE) ×3 IMPLANT

## 2013-10-10 NOTE — ED Notes (Addendum)
Pt was rolling a suitcase down the steps this am, tripped, twisting her left ankle, denies any other injury, denies any LOC, denies any hitthing her head. Positive distal pulses, sensation, good cap refill noted, ankle splinted, cms remains intact after spline, pt states that she took one 2mg  xanax around 6:20 this am, denies drinking any fluids this am,

## 2013-10-10 NOTE — H&P (Addendum)
Claudia [name] is an 58 y.o. female.   Chief Complaint: I broke my ankle  HPI: She was preparing to go out of town to vacation at [place].  She was pulling a suitcase down her stairs.  As she got to the final landing she lost her footing and fell twisting her ankle as she was holding her suitcase.  She had marked pain and deformity of the left ankle.  X-rays here showed displaced trimalleolar fracture with dislocation of the ankle on the left.  Closed reduction was done.  She has no other injury.  I have told her the need for surgery on the left ankle.  I have gone over risks and imponderables including infection, embolus which could cause death, need for physical therapy, need for splint/cast and possible anesthesia risks.  She asked appropriate questions.  She has had multiple surgical procedures in the past.  She appears to understand and agrees to the procedure.  She has not eaten since last night and I will take her to surgery today.  She did take her medicines today with small amount of water.  Past Medical History  Diagnosis Date  . Anxiety   . Depression   . Hypertension   . Breast cancer   . GERD (gastroesophageal reflux disease)   . Cancer of right breast     mastectomy  . Irritable bowel   . Hiatal hernia   . Hypercholesterolemia     Past Surgical History  Procedure Laterality Date  . Mastectomy    . Tubal ligation    . Abdominal hysterectomy    . Total abdominal hysterectomy w/ bilateral salpingoophorectomy    . Tonsillectomy    . Cholecystectomy    . Foot surgery      bilateral spur removal    No family history on file. Social History:  reports that she has been smoking Cigarettes.  She has been smoking about 0.00 packs per day. She does not have any smokeless tobacco history on file. She reports that she does not drink alcohol or use illicit drugs.  Allergies:  Allergies  Allergen Reactions  . Statins Other (See Comments)    Muscle pain     (Not in a  hospital admission)  Results for orders placed during the hospital encounter of 10/10/13 (from the past 48 hour(s))  BASIC METABOLIC PANEL     Status: Abnormal   Collection Time    10/10/13  7:22 AM      Result Value Ref Range   Sodium 142  137 - 147 mEq/L   Potassium 4.0  3.7 - 5.3 mEq/L   Chloride 104  96 - 112 mEq/L   CO2 26  19 - 32 mEq/L   Glucose, Bld 106 (*) 70 - 99 mg/dL   BUN 13  6 - 23 mg/dL   Creatinine, Ser 0.65  0.50 - 1.10 mg/dL   Calcium 10.2  8.4 - 10.5 mg/dL   GFR calc non Af Amer >90  >90 mL/min   GFR calc Af Amer >90  >90 mL/min   Comment: (NOTE)     The eGFR has been calculated using the CKD EPI equation.     This calculation has not been validated in all clinical situations.     eGFR's persistently <90 mL/min signify possible Chronic Kidney     Disease.  CBC WITH DIFFERENTIAL     Status: None   Collection Time    10/10/13  7:22 AM  Result Value Ref Range   WBC 8.2  4.0 - 10.5 K/uL   RBC 4.64  3.87 - 5.11 MIL/uL   Hemoglobin 14.4  12.0 - 15.0 g/dL   HCT 42.3  36.0 - 46.0 %   MCV 91.2  78.0 - 100.0 fL   MCH 31.0  26.0 - 34.0 pg   MCHC 34.0  30.0 - 36.0 g/dL   RDW 13.0  11.5 - 15.5 %   Platelets 186  150 - 400 K/uL   Neutrophils Relative % 69  43 - 77 %   Neutro Abs 5.6  1.7 - 7.7 K/uL   Lymphocytes Relative 23  12 - 46 %   Lymphs Abs 1.9  0.7 - 4.0 K/uL   Monocytes Relative 6  3 - 12 %   Monocytes Absolute 0.5  0.1 - 1.0 K/uL   Eosinophils Relative 1  0 - 5 %   Eosinophils Absolute 0.1  0.0 - 0.7 K/uL   Basophils Relative 0  0 - 1 %   Basophils Absolute 0.0  0.0 - 0.1 K/uL   Dg Ankle Left Port  10/10/2013   CLINICAL DATA:  Post reduction ankle films, portable  EXAM: PORTABLE LEFT ANKLE - 2 VIEW  COMPARISON:  DG ANKLE*L*PORT dated 10/10/2013  FINDINGS: The patient has undergone closed reduction of the trimalleolar left ankle fracture. The ankle joint mortise appears near anatomic. The medial and lateral malleoli also appear near anatomically  positioned. There remains mild superior distraction of the posterior malleolar fracture fragment by approximately 3 mm.  IMPRESSION: The patient has undergone successful closed reduction of the trimalleolar fracture of the left ankle.   Electronically Signed   By: David  Martinique   On: 10/10/2013 08:27   Dg Ankle Left Port  10/10/2013   CLINICAL DATA:  Left ankle pain status post fall  EXAM: PORTABLE LEFT ANKLE - 2 VIEW  COMPARISON:  None.  FINDINGS: AP and lateral portable views of the ankle reveal a trimalleolar fracture dislocation. There is disruption of the ankle joint mortise. A displaced medial malleolar fracture is not present. The posterior malleolus also exhibits a displaced fracture. There is a angulated displaced fracture of the distal left fibular metadiaphysis. The tailor dome appears intact. The calcaneus and other visualized hindfoot bones exhibit no acute abnormalities. There are degenerative changes of the hindfoot.  IMPRESSION: The patient has sustained a trimalleolar fracture dislocation of the left ankle.   Electronically Signed   By: David  Martinique   On: 10/10/2013 07:50    Review of Systems  Respiratory:       Smoker.  Musculoskeletal: Positive for falls (Today and hurt left ankle.).  Neurological:       History of vertigo.  Endo/Heme/Allergies:       History of breast cancer surgery and reconstruction. History of hysterectomy and removal of both ovaries.  Psychiatric/Behavioral:       History of depression. History of panic attacks and anxiety.  On daily medication for this.    Blood pressure 122/74, pulse 81, temperature 98.1 F (36.7 C), temperature source Oral, resp. rate 17, height _0  (1.626 m), weight 83.915 kg (185 lb), SpO2 96.00%. Physical Exam  Constitutional: She is oriented to person, place, and time. She appears well-developed and well-nourished.  HENT:  Head: Normocephalic and atraumatic.  Eyes: Conjunctivae and EOM are normal. Pupils are equal, round,  and reactive to light.  Neck: Normal range of motion. Neck supple.  Cardiovascular: Normal rate and regular rhythm.  Respiratory: Effort normal and breath sounds normal.  GI: Soft.  Musculoskeletal: She exhibits tenderness (Pain left ankle. She is in posterior splint post reduction in ER by ER physician.  NV is intact.).  Neurological: She is alert and oriented to person, place, and time. She has normal reflexes.  Skin: Skin is warm and dry.  Psychiatric: She has a normal mood and affect. Her behavior is normal. Judgment and thought content normal.     Assessment/Plan Post right ankle trimalleolar fracture with dislocation.  Post reduction.  Need for surgery and fixation. History of panic attacks. Smoker. History of breast cancer and reconstruction surgery.   Leatrice Parilla 10/10/2013, 9:12 AM

## 2013-10-10 NOTE — ED Notes (Signed)
Pt transferred to  OR.

## 2013-10-10 NOTE — Transfer of Care (Signed)
Immediate Anesthesia Transfer of Care Note  Patient: Claudia [name]  Procedure(s) Performed: Procedure(s): OPEN REDUCTION INTERNAL FIXATION (ORIF) ANKLE FRACTURE (Left)  Patient Location: PACU  Anesthesia Type:General  Level of Consciousness: awake, alert , oriented and patient cooperative  Airway & Oxygen Therapy: Patient Spontanous Breathing and Patient connected to face mask oxygen  Post-op Assessment: Report given to PACU RN and Post -op Vital signs reviewed and stable  Post vital signs: Reviewed and stable  Complications: No apparent anesthesia complications

## 2013-10-10 NOTE — ED Notes (Signed)
Pt at pre-procedure baseline. Tolerated procedure well. Splinting applied by EDP, Cruzita Lederer, and Alfredo Martinez.

## 2013-10-10 NOTE — Anesthesia Postprocedure Evaluation (Signed)
  Anesthesia Post-op Note  Patient: Claudia [name]  Procedure(s) Performed: Procedure(s): OPEN REDUCTION INTERNAL FIXATION (ORIF) ANKLE FRACTURE (Left)  Patient Location: PACU  Anesthesia Type:General  Level of Consciousness: awake, alert , oriented and patient cooperative  Airway and Oxygen Therapy: Patient Spontanous Breathing and Patient connected to face mask oxygen  Post-op Pain: moderate  Post-op Assessment: Post-op Vital signs reviewed, Patient's Cardiovascular Status Stable, Respiratory Function Stable, Patent Airway, No signs of Nausea or vomiting and Pain level controlled  Post-op Vital Signs: Reviewed and stable  Complications: No apparent anesthesia complications

## 2013-10-10 NOTE — Progress Notes (Signed)
INITIAL NUTRITION ASSESSMENT  DOCUMENTATION CODES Per approved criteria  -Obesity Unspecified   INTERVENTION:  Ensure daily, each supplement provides an additional 350 kcal and 13 grams protein  NUTRITION DIAGNOSIS: Unintended weight loss related to anxiety as evidenced by patient report of 30 lb weight loss.   Goal: Patient to meet >/= 90% of estimated nutrition needs  Monitor:  PO intake and supplement acceptance, weight trends, labs, I/Os  Reason for Assessment: Ankle Fracture  58 y.o. female  Admitting Dx: Ankle Fracture  ASSESSMENT: She was preparing to go out of town to vacation at [place]. She was pulling a suitcase down her stairs. As she got to the final landing she lost her footing and fell twisting her ankle as she was holding her suitcase. She had marked pain and deformity of the left ankle. X-rays here showed displaced trimalleolar fracture with dislocation of the ankle on the left. Closed reduction was done. She has no other injury.  3/17:  Open Reduction Internal Fixation Ankle Fracture medial and lateral (left)  Patient was on pain medication upon dietetic intern visit. Patient's sisters at bedside. Patient's sisters reported that patient usually eats 2 meals per day and maybe 1-2 snacks. Patient eats breakfast, lunch, and sometimes a small dinner. Patient likes fast food. Patient reported losing 30 lb in September when she had issues with anxiety. Patient stated that she was unable to eat any food and survived off of [employer] breakfast shakes. Patient believes she has gained 12 pounds back since September as she has been eating a lot of "junk."  Dietetic intern encouraged patient to eat well and to increase intake of protein foods. Patient's sisters believe patient will discharge tomorrow. Recommended to continue PO supplements at home if intake is poor.   Nutrition Focused Physical Exam:  Subcutaneous Fat:  Orbital Region: WNL Upper Arm Region: N/A Thoracic  and Lumbar Region: N/A  Muscle:  Temple Region: WNL Clavicle Bone Region: WNL Clavicle and Acromion Bone Region: N/A Scapular Bone Region: N/A Dorsal Hand: WNL Patellar Region: N/A Anterior Thigh Region: N/A Posterior Calf Region: N/A  Edema: none noted  Height: Ht Readings from Last 1 Encounters:  10/10/13 5\' 4"  (1.626 m)    Weight: Wt Readings from Last 1 Encounters:  10/10/13 185 lb (83.915 kg)    Ideal Body Weight: 120 lb (54.5 kg)  % Ideal Body Weight: 154%  Wt Readings from Last 10 Encounters:  10/10/13 185 lb (83.915 kg)  10/10/13 185 lb (83.915 kg)  10/22/12 197 lb 12 oz (89.7 kg)  08/30/12 192 lb (87.091 kg)    Usual Body Weight: 200 lb (90.9 kg)  % Usual Body Weight: 93%  BMI:  Body mass index is 31.74 kg/(m^2).  Estimated Nutritional Needs: Kcal: 1700-1900 Protein: 95-105 grams Fluid: 1.7-1.9 L  Skin: Closed Incision on Left Ankle  Diet Order: General  EDUCATION NEEDS: -No education needs identified at this time   Intake/Output Summary (Last 24 hours) at 10/10/13 1514 Last data filed at 10/10/13 1350  Gross per 24 hour  Intake   1100 ml  Output     10 ml  Net   1090 ml    Last BM: PTA  Labs:   Recent Labs Lab 10/10/13 0722  NA 142  K 4.0  CL 104  CO2 26  BUN 13  CREATININE 0.65  CALCIUM 10.2  GLUCOSE 106*    CBG (last 3)  No results found for this basename: GLUCAP,  in the last 72 hours  Scheduled Meds: . alprazolam  2 mg Oral QID  . [START ON 10/11/2013] ARIPiprazole  5 mg Oral Daily  . [START ON 10/11/2013] enoxaparin (LOVENOX) injection  40 mg Subcutaneous Q24H  . ezetimibe  10 mg Oral q1800  . hydrocortisone  1 application Topical TID  . morphine   Intravenous 6 times per day  . omega-3 acid ethyl esters  2 g Oral Daily  . pantoprazole  40 mg Oral Daily  . [START ON 10/11/2013] sertraline  200 mg Oral Daily  . zolpidem  5 mg Oral QHS    Continuous Infusions: . dextrose 5 % and 0.45% NaCl 50 mL/hr at 10/10/13  1506    Past Medical History  Diagnosis Date  . Anxiety   . Depression   . Hypertension   . Breast cancer   . GERD (gastroesophageal reflux disease)   . Cancer of right breast     mastectomy  . Irritable bowel   . Hiatal hernia   . Hypercholesterolemia     Past Surgical History  Procedure Laterality Date  . Mastectomy    . Tubal ligation    . Abdominal hysterectomy    . Total abdominal hysterectomy w/ bilateral salpingoophorectomy    . Tonsillectomy    . Cholecystectomy    . Foot surgery      bilateral spur removal    Claudell Kyle, Dietetic Intern Pager: [phone]  I agree with student's assessment.  Kijana Cromie A. Jimmye Norman, RD, LDN Pager: [phone]

## 2013-10-10 NOTE — Anesthesia Preprocedure Evaluation (Signed)
Anesthesia Evaluation  Patient identified by MRN, date of birth, ID band Patient awake    Reviewed: Allergy & Precautions, H&P , NPO status , Patient's Chart, lab work & pertinent test results  History of Anesthesia Complications (+) PONV and history of anesthetic complications (hx hypoxemia after surgery)  Airway Mallampati: II TM Distance: >3 FB Neck ROM: Full    Dental  (+) Poor Dentition, Dental Advisory Given, Teeth Intact   Pulmonary COPDCurrent Smoker,  breath sounds clear to auscultation        Cardiovascular hypertension (off meds now), Rhythm:Regular Rate:Normal     Neuro/Psych PSYCHIATRIC DISORDERS Anxiety Depression    GI/Hepatic GERD-  Controlled and Medicated,  Endo/Other    Renal/GU      Musculoskeletal   Abdominal   Peds  Hematology   Anesthesia Other Findings   Reproductive/Obstetrics                           Anesthesia Physical Anesthesia Plan  ASA: III and emergent  Anesthesia Plan: General   Post-op Pain Management:    Induction: Intravenous, Rapid sequence and Cricoid pressure planned  Airway Management Planned: Oral ETT  Additional Equipment:   Intra-op Plan:   Post-operative Plan: Extubation in OR  Informed Consent: I have reviewed the patients History and Physical, chart, labs and discussed the procedure including the risks, benefits and alternatives for the proposed anesthesia with the patient or authorized representative who has indicated his/her understanding and acceptance.     Plan Discussed with:   Anesthesia Plan Comments:         Anesthesia Quick Evaluation

## 2013-10-10 NOTE — ED Notes (Signed)
Consent signed by EDP, patient and witness.

## 2013-10-10 NOTE — Consult Note (Signed)
[facility] Medical Consultation  TENLEY WINWARD ZJI:[id number] DOB: 1956/05/26 DOA: 10/10/2013 PCP: No primary provider on file.   Requesting physician: Dr. Luna Glasgow, orthopedics. Date of consultation: 10/10/2013. Reason for consultation: Management of panic disorder.  Impression/Recommendations Active Problems:   Closed trimalleolar fracture of left ankle    1. Panic disorder/anxiety, stable-I do not have further recommendations in terms of further management of this disorder. She appears to be quite stable with her current regimen. 2. Closed fracture of the left ankle, status post surgery.  I will followup again tomorrow. Please contact me if I can be of assistance in the meanwhile. Thank you for this consultation.  Chief Complaint: Slight anxiety.  HPI:  This 58 year old lady was admitted to the hospital after a closed fracture of the left ankle. She has had an operation by Dr. Luna Glasgow, orthopedics. She has a history of severe panic disorder for which he is on several medications which are listed. Currently, she feels reasonably okay and is not having any panic symptoms.  Review of Systems:  Review of systems is negative.  Past Medical History  Diagnosis Date  . Anxiety   . Depression   . Hypertension   . Breast cancer   . GERD (gastroesophageal reflux disease)   . Cancer of right breast     mastectomy  . Irritable bowel   . Hiatal hernia   . Hypercholesterolemia    Past Surgical History  Procedure Laterality Date  . Mastectomy    . Tubal ligation    . Abdominal hysterectomy    . Total abdominal hysterectomy w/ bilateral salpingoophorectomy    . Tonsillectomy    . Cholecystectomy    . Foot surgery      bilateral spur removal   Social History:  reports that she has been smoking Cigarettes.  She has a 40 pack-year smoking history. She does not have any smokeless tobacco history on file. She reports that she does not drink alcohol or use illicit  drugs.  Allergies  Allergen Reactions  . Statins Other (See Comments)    Muscle pain   History reviewed. No pertinent family history.  Prior to Admission medications   Medication Sig Start Date End Date Taking? Authorizing Provider  alprazolam Duanne Moron) 2 MG tablet Take 2 mg by mouth 4 (four) times daily.   Yes Historical Provider, MD  ARIPiprazole (ABILIFY) 5 MG tablet Take 5 mg by mouth daily.   Yes Historical Provider, MD  aspirin EC 81 MG tablet Take 81 mg by mouth daily as needed for moderate pain.   Yes Historical Provider, MD  ezetimibe (ZETIA) 10 MG tablet Take 1 tablet (10 mg total) by mouth daily. 09/02/12  Yes Waylan Boga, NP  fish oil-omega-3 fatty acids 1000 MG capsule Take 2 capsules (2 g total) by mouth daily. 09/02/12  Yes Waylan Boga, NP  HYDROcodone-acetaminophen (NORCO) 10-325 MG per tablet Take 1 tablet by mouth 3 (three) times daily as needed for pain.   Yes Historical Provider, MD  hydrocortisone 2.5 % cream Apply 1 application topically See admin instructions. 2-3 times daily as needed for eczema on face 09/02/12  Yes Waylan Boga, NP  omeprazole (PRILOSEC) 20 MG capsule Take 20 mg by mouth daily as needed. For acid reflux   Yes Historical Provider, MD  sertraline (ZOLOFT) 100 MG tablet Take 200 mg by mouth daily.  09/02/12  Yes Waylan Boga, NP  zolpidem (AMBIEN) 10 MG tablet Take 1 tablet by mouth at bedtime. 10/04/13  Yes Historical  Provider, MD   Physical Exam: Blood pressure 128/73, pulse 80, temperature 97.4 F (36.3 C), temperature source Oral, resp. rate 19, height 5\' 4"  (1.626 m), weight 83.915 kg (185 lb), SpO2 92.00%. Filed Vitals:   10/10/13 1801  BP: 128/73  Pulse: 80  Temp:   Resp: 19     General:  She looks systemically well. She is nontoxic or septic. She is hemodynamically stable.  Eyes: No pallor. No jaundice. External ocular movements are normal.  ENT: Within normal limits.  Neck: No neck lymphadenopathy.  Cardiovascular: Heart sounds are  present without murmurs or added sounds.  Respiratory: Lung fields are clear.  Abdomen: Soft and nontender.  Skin: No rash.  Musculoskeletal: Left ankle fracture which is in plaster now.  Psychiatric: Appropriate affect.  Neurologic: Alert and orientated without any focal neurological signs.  Labs on Admission:  Basic Metabolic Panel:  Recent Labs Lab 10/10/13 0722  NA 142  K 4.0  CL 104  CO2 26  GLUCOSE 106*  BUN 13  CREATININE 0.65  CALCIUM 10.2       CBC:  Recent Labs Lab 10/10/13 0722  WBC 8.2  NEUTROABS 5.6  HGB 14.4  HCT 42.3  MCV 91.2  PLT 186     Radiological Exams on Admission: Dg Ankle 2 Views Left  10/10/2013   CLINICAL DATA:  Left ankle fracture, ORIF  EXAM: LEFT ANKLE - 2 VIEW; DG C-ARM 1-60 MIN  COMPARISON:  Two digital C-arm fluoroscopic images obtained intraoperatively are compared to preoperative images of 10/10/2013  FINDINGS: Single K-wire and single screw across the previously seen medial malleolar fracture.  Lateral plate and 6 screws across a reduced lateral malleolar fracture.  Ankle mortise intact.  Diffuse soft tissue swelling.  Posterior malleolar fracture seen on previous exam is obscured on lateral view by fibular plate.  No other focal osseous abnormalities identified.  IMPRESSION: Post ORIF of medial and lateral malleolar fractures left ankle.   Electronically Signed   By: Lavonia Dana M.D.   On: 10/10/2013 13:59   Dg Ankle Left Port  10/10/2013   CLINICAL DATA:  Post reduction ankle films, portable  EXAM: PORTABLE LEFT ANKLE - 2 VIEW  COMPARISON:  DG ANKLE*L*PORT dated 10/10/2013  FINDINGS: The patient has undergone closed reduction of the trimalleolar left ankle fracture. The ankle joint mortise appears near anatomic. The medial and lateral malleoli also appear near anatomically positioned. There remains mild superior distraction of the posterior malleolar fracture fragment by approximately 3 mm.  IMPRESSION: The patient has undergone  successful closed reduction of the trimalleolar fracture of the left ankle.   Electronically Signed   By: David  Martinique   On: 10/10/2013 08:27   Dg Ankle Left Port  10/10/2013   CLINICAL DATA:  Left ankle pain status post fall  EXAM: PORTABLE LEFT ANKLE - 2 VIEW  COMPARISON:  None.  FINDINGS: AP and lateral portable views of the ankle reveal a trimalleolar fracture dislocation. There is disruption of the ankle joint mortise. A displaced medial malleolar fracture is not present. The posterior malleolus also exhibits a displaced fracture. There is a angulated displaced fracture of the distal left fibular metadiaphysis. The tailor dome appears intact. The calcaneus and other visualized hindfoot bones exhibit no acute abnormalities. There are degenerative changes of the hindfoot.  IMPRESSION: The patient has sustained a trimalleolar fracture dislocation of the left ankle.   Electronically Signed   By: David  Martinique   On: 10/10/2013 07:50   Dg C-arm  1-60 Min  10/10/2013   CLINICAL DATA:  Left ankle fracture, ORIF  EXAM: LEFT ANKLE - 2 VIEW; DG C-ARM 1-60 MIN  COMPARISON:  Two digital C-arm fluoroscopic images obtained intraoperatively are compared to preoperative images of 10/10/2013  FINDINGS: Single K-wire and single screw across the previously seen medial malleolar fracture.  Lateral plate and 6 screws across a reduced lateral malleolar fracture.  Ankle mortise intact.  Diffuse soft tissue swelling.  Posterior malleolar fracture seen on previous exam is obscured on lateral view by fibular plate.  No other focal osseous abnormalities identified.  IMPRESSION: Post ORIF of medial and lateral malleolar fractures left ankle.   Electronically Signed   By: Lavonia Dana M.D.   On: 10/10/2013 13:59      Time spent: 30 minutes.  Doree Albee [facility] [phone].  If 7PM-7AM, please contact night-coverage [email or web address] Password [facility] 10/10/2013, 8:41 PM

## 2013-10-10 NOTE — ED Notes (Signed)
Ice applied at triage.

## 2013-10-10 NOTE — Preoperative (Signed)
Beta Blockers   Reason not to administer Beta Blockers:Not Applicable 

## 2013-10-10 NOTE — Brief Op Note (Signed)
10/10/2013  11:47 AM  PATIENT:  Claudia [name]  58 y.o. female  PRE-OPERATIVE DIAGNOSIS:  Fracture Left ankle, Trimalleolar  POST-OPERATIVE DIAGNOSIS:  Left Trimaellor Fracture  PROCEDURE:  Procedure(s): OPEN REDUCTION INTERNAL FIXATION (ORIF) ANKLE FRACTURE (Left) medial and lateral  SURGEON:  Surgeon(s) and Role:    * Sanjuana Kava, MD - Primary  PHYSICIAN ASSISTANT:   ASSISTANTS: D. [place], RN   ANESTHESIA:   general  EBL:  Total I/O In: 1000 [I.V.:1000] Out: 10 [Blood:10]  BLOOD ADMINISTERED:none  DRAINS: none   LOCAL MEDICATIONS USED:  NONE  SPECIMEN:  No Specimen  DISPOSITION OF SPECIMEN:  N/A  COUNTS:  YES  TOURNIQUET:   Total Tourniquet Time Documented: area (laterality) - 43 minutes Total: area (laterality) - 43 minutes   DICTATION: .Other Dictation: Dictation Number [id number]  PLAN OF CARE: Admit to inpatient   PATIENT DISPOSITION:  PACU - hemodynamically stable.   Delay start of Pharmacological VTE agent (>24hrs) due to surgical blood loss or risk of bleeding: no

## 2013-10-10 NOTE — ED Notes (Signed)
Pt has obvious deformity on inner side of lt ankle. Large swollen, bruised area. Pedal pulses easily palpated, pt detects touch sensation, foot warm to the touch, able to wiggle toes easily.

## 2013-10-10 NOTE — ED Notes (Signed)
Pt has maintained NPO status since arrival to ER.

## 2013-10-10 NOTE — Anesthesia Procedure Notes (Signed)
Procedure Name: Intubation Date/Time: 10/10/2013 10:44 AM Performed by: Andree Elk, AMY A Pre-anesthesia Checklist: Patient identified, Patient being monitored, Timeout performed, Emergency Drugs available and Suction available Patient Re-evaluated:Patient Re-evaluated prior to inductionOxygen Delivery Method: Circle System Utilized Preoxygenation: Pre-oxygenation with 100% oxygen Intubation Type: IV induction, Rapid sequence and Cricoid Pressure applied Ventilation: Mask ventilation without difficulty Laryngoscope Size: 3 and Miller Grade View: Grade I Tube type: Oral Tube size: 7.0 mm Number of attempts: 1 Airway Equipment and Method: stylet Placement Confirmation: ETT inserted through vocal cords under direct vision,  positive ETCO2 and breath sounds checked- equal and bilateral Secured at: 21 cm Tube secured with: Tape Dental Injury: Teeth and Oropharynx as per pre-operative assessment

## 2013-10-10 NOTE — ED Provider Notes (Signed)
CSN: [id number]     Arrival date & time 10/10/13  9371 History   First MD Initiated Contact with Patient 10/10/13 [phone]     Chief Complaint  Patient presents with  . Ankle Pain      Patient is a 58 y.o. female presenting with ankle pain. The history is provided by the patient.  Ankle Pain Location:  Ankle Time since incident:  30 minutes Injury: yes   Ankle location:  L ankle Pain details:    Quality:  Aching   Radiates to:  Does not radiate   Severity:  Severe   Onset quality:  Sudden   Timing:  Constant   Progression:  Worsening Chronicity:  New Dislocation: yes   Prior injury to area:  No Relieved by:  Rest Worsened by:  Activity Associated symptoms: numbness and swelling   Associated symptoms: no back pain and no neck pain   pt twisted left ankle this morning while leaving for a trip to [place], [place] No head or neck injury.  She reports she twisted the left ankle only She denies knee pain or any other injury aside from her ankle  Past Medical History  Diagnosis Date  . Anxiety   . Depression   . Hypertension   . Breast cancer   . GERD (gastroesophageal reflux disease)   . Cancer of right breast     mastectomy  . Irritable bowel   . Hiatal hernia   . Hypercholesterolemia    Past Surgical History  Procedure Laterality Date  . Mastectomy    . Tubal ligation    . Abdominal hysterectomy    . Total abdominal hysterectomy w/ bilateral salpingoophorectomy    . Tonsillectomy    . Cholecystectomy    . Foot surgery      bilateral spur removal   No family history on file. History  Substance Use Topics  . Smoking status: Current Some Day Smoker    Types: Cigarettes  . Smokeless tobacco: Not on file  . Alcohol Use: No   OB History   Grav Para Term Preterm Abortions TAB SAB Ect Mult Living                 Review of Systems  Cardiovascular: Negative for chest pain.  Gastrointestinal: Negative for vomiting.  Musculoskeletal: Negative for back pain and neck  pain.  Neurological: Negative for weakness.  All other systems reviewed and are negative.      Allergies  Statins  Home Medications   Current Outpatient Rx  Name  Route  Sig  Dispense  Refill  . alprazolam (XANAX) 2 MG tablet   Oral   Take 0.5 tablets (1 mg total) by mouth 4 (four) times daily.   30 tablet      . amLODipine-benazepril (LOTREL) 10-20 MG per capsule   Oral   Take 1 capsule by mouth at bedtime.   30 capsule   0   . anastrozole (ARIMIDEX) 1 MG tablet   Oral   Take 1 tablet (1 mg total) by mouth at bedtime.   1 tablet   0   . cholecalciferol (VITAMIN D) 400 UNITS TABS   Oral   Take 400 Units by mouth 2 (two) times daily.         Marland Kitchen ezetimibe (ZETIA) 10 MG tablet   Oral   Take 1 tablet (10 mg total) by mouth daily.   30 tablet   0   . fish oil-omega-3 fatty acids 1000 MG capsule  Oral   Take 2 capsules (2 g total) by mouth daily.   60 capsule   0   . HYDROcodone-acetaminophen (NORCO) 10-325 MG per tablet   Oral   Take 1 tablet by mouth 3 (three) times daily as needed for pain.         . hydrocortisone 2.5 % cream   Topical   Apply 1 application topically See admin instructions. 2-3 times daily as needed for eczema on face   30 g   0   . meclizine (ANTIVERT) 25 MG tablet   Oral   Take 1 tablet (25 mg total) by mouth 3 (three) times daily as needed for dizziness.   30 tablet   1   . omeprazole (PRILOSEC) 20 MG capsule   Oral   Take 20 mg by mouth daily as needed. For acid reflux         . sertraline (ZOLOFT) 100 MG tablet   Oral   Take 250 mg by mouth daily.          BP 123/71  Pulse 82  Temp(Src) 98.1 F (36.7 C) (Oral)  Resp 22  Ht 5\' 4"  (1.626 m)  Wt 185 lb (83.915 kg)  BMI 31.74 kg/m2  SpO2 96% Physical Exam CONSTITUTIONAL: Well developed/well nourished HEAD: Normocephalic/atraumatic EYES: EOMI/PERRL ENMT: Mucous membranes moist NECK: supple no meningeal signs SPINE:no cervical spine tenderness CV: S1/S2  noted, no murmurs/rubs/gallops noted LUNGS: Lungs are clear to auscultation bilaterally, no apparent distress ABDOMEN: soft, nontender, no rebound or guarding GU:no cva tenderness NEURO: Pt is awake/alert, moves all extremitiesx4, she is able to move toes on left foot EXTREMITIES: pulses normal/equal in lower extremities.  Obvious deformity to left ankle with tenting of skin medially but no laceration noted.  There is no left prox fib tenderness.   SKIN: warm, color normal PSYCH: no abnormalities of mood noted  ED Course  Reduction of dislocation Date/Time: 10/10/2013 8:19 AM Performed by: Sharyon Cable Authorized by: Sharyon Cable Consent: written consent obtained. Time out: Immediately prior to procedure a "time out" was called to verify the correct patient, procedure, equipment, support staff and site/side marked as required. Patient sedated: yes Sedation type: moderate (conscious) sedation Vitals: Vital signs were monitored during sedation. Patient tolerance: Patient tolerated the procedure well with no immediate complications. Comments: Left ankle fracture/dislocation reduced after adequate sedation with propofol Closed reduction performed Pt tolerated well Distal pulses intact after reduction      Procedural sedation Performed by: Sharyon Cable Consent: Verbal consent obtained. Risks and benefits: risks, benefits and alternatives were discussed Required items: required devices, and special equipment available Patient identity confirmed: arm band and provided demographic data Time out: Immediately prior to procedure a "time out" was called to verify the correct patient, procedure, equipment, support staff and site/side marked as required. Sedation type: moderate (conscious) sedation NPO time confirmed and considered Sedatives: PROPOFOL Physician Time at Bedside: 10 Vitals: Vital signs were monitored during sedation. Cardiac Monitor, pulse oximeter Patient  tolerance: Patient tolerated the procedure well with no immediate complications. Comments: Pt with uneventful recovery. Returned to pre-procedural sedation baseline   SPLINT APPLICATION Date/Time: 9:52 AM Authorized by: Sharyon Cable Consent: Verbal consent obtained. Risks and benefits: risks, benefits and alternatives were discussed Consent given by: patient Splint applied by: myself and nurse Location details: left ankle Splint type: left lower leg splint and stirrup splint Supplies used: ortho glass Post-procedure: The splinted body part was neurovascularly unchanged following the procedure. Patient tolerance: Patient  tolerated the procedure well with no immediate complications.     Labs Review Labs Reviewed  BASIC METABOLIC PANEL - Abnormal; Notable for the following:    Glucose, Bld 106 (*)    All other components within normal limits  CBC WITH DIFFERENTIAL   Imaging Review Dg Ankle Left Port  10/10/2013   CLINICAL DATA:  Left ankle pain status post fall  EXAM: PORTABLE LEFT ANKLE - 2 VIEW  COMPARISON:  None.  FINDINGS: AP and lateral portable views of the ankle reveal a trimalleolar fracture dislocation. There is disruption of the ankle joint mortise. A displaced medial malleolar fracture is not present. The posterior malleolus also exhibits a displaced fracture. There is a angulated displaced fracture of the distal left fibular metadiaphysis. The tailor dome appears intact. The calcaneus and other visualized hindfoot bones exhibit no acute abnormalities. There are degenerative changes of the hindfoot.  IMPRESSION: The patient has sustained a trimalleolar fracture dislocation of the left ankle.   Electronically Signed   By: David  Martinique   On: 10/10/2013 07:50   8:45 AM Successful reduction Pt stable D/w dr Luna Glasgow will admit for operative repair   MDM   Final diagnoses:  Closed left trimalleolar fracture    Nursing notes including past medical history and social  history reviewed and considered in documentation xrays reviewed and considered Labs/vital reviewed and considered     Sharyon Cable, MD 10/10/13 [phone]

## 2013-10-11 LAB — CBC WITH DIFFERENTIAL/PLATELET
Basophils Absolute: 0 10*3/uL (ref 0.0–0.1)
Basophils Relative: 0 % (ref 0–1)
EOS ABS: 0 10*3/uL (ref 0.0–0.7)
Eosinophils Relative: 0 % (ref 0–5)
HCT: 36.9 % (ref 36.0–46.0)
HEMOGLOBIN: 12.4 g/dL (ref 12.0–15.0)
LYMPHS ABS: 2.3 10*3/uL (ref 0.7–4.0)
Lymphocytes Relative: 19 % (ref 12–46)
MCH: 31.2 pg (ref 26.0–34.0)
MCHC: 33.6 g/dL (ref 30.0–36.0)
MCV: 92.7 fL (ref 78.0–100.0)
Monocytes Absolute: 1.2 10*3/uL — ABNORMAL HIGH (ref 0.1–1.0)
Monocytes Relative: 10 % (ref 3–12)
NEUTROS ABS: 8.5 10*3/uL — AB (ref 1.7–7.7)
NEUTROS PCT: 71 % (ref 43–77)
Platelets: 184 10*3/uL (ref 150–400)
RBC: 3.98 MIL/uL (ref 3.87–5.11)
RDW: 13 % (ref 11.5–15.5)
WBC: 12 10*3/uL — AB (ref 4.0–10.5)

## 2013-10-11 LAB — BASIC METABOLIC PANEL
BUN: 9 mg/dL (ref 6–23)
CALCIUM: 9 mg/dL (ref 8.4–10.5)
CO2: 29 mEq/L (ref 19–32)
Chloride: 106 mEq/L (ref 96–112)
Creatinine, Ser: 0.53 mg/dL (ref 0.50–1.10)
Glucose, Bld: 116 mg/dL — ABNORMAL HIGH (ref 70–99)
POTASSIUM: 4.3 meq/L (ref 3.7–5.3)
SODIUM: 143 meq/L (ref 137–147)

## 2013-10-11 NOTE — Evaluation (Signed)
Physical Therapy Evaluation Patient Details Name: Claudia [name] MRN: [id number] DOB: 03-Sep-1955 Today's Date: 10/11/2013 Time: 0277-4128 PT Time Calculation (min): 44 min  PT Assessment / Plan / Recommendation History of Present Illness  Patient fell and twisted her ankle while pulling suit case down steps and twisted it on concrete.  Patient has a history of falls (1 in last year also resulting in sprained ankle.  Clinical Impression  Patient laying up in bed upon arrival with patient stating she is ready to get up out of bed though she is slightly afraid of falling as she feels a little shaky which she attributes to her pain mediation. Patient was modified independent with all bed mobility and transfers requiring only occasional cues for hand and foot pl;acement to remember precautions. Patient appears safe to DC home assuming that she can ambulate up 4 steps to get in home which was not tests today due to fatigue (will be tested and patient will be given instructions tomorrow prior to DC). Patient ambulated with good balance and good awareness. Patient states that the distance she ambulated is similar to distance to get in home from car and find a chair to sit in. Patient is safe to DC home, Continue PT during stay. Patient will be trained in stair ambulation tomorrow.     PT Assessment  Patient needs continued PT services    Follow Up Recommendations  Outpatient PT          Equipment Recommendations  Rolling walker with 5" wheels       Frequency 7X/week    Precautions / Restrictions Precautions Precautions: Fall Restrictions Weight Bearing Restrictions: Yes   Pertinent Vitals/Pain 7/10 pain      Mobility  Bed Mobility Overal bed mobility: Modified Independent Transfers Overall transfer level: Modified independent Equipment used: Rolling walker (2 wheeled) Ambulation/Gait Ambulation/Gait assistance: Supervision Ambulation Distance (Feet): 15 Feet Assistive device:  Rolling walker (2 wheeled) Gait Pattern/deviations: Step-to pattern (NWB on Lt LE) Gait velocity interpretation: <1.8 ft/sec, indicative of risk for recurrent falls    Exercises General Exercises - Lower Extremity Ankle Circles/Pumps: AROM;Right;10 reps Heel Slides: AROM;Right;10 reps Straight Leg Raises: AROM;Both Other Exercises Other Exercises: Bridges Lt LE 10x   PT Diagnosis:    PT Problem List: Decreased strength;Decreased activity tolerance PT Treatment Interventions: Gait training;Stair training     PT Goals(Current goals can be found in the care plan section) Acute Rehab PT Goals Patient Stated Goal: to be able to walk 45ft PT Goal Formulation: With patient Time For Goal Achievement: 10/18/13 Potential to Achieve Goals: Good  Visit Information  Last PT Received On: 10/11/13 Assistance Needed: +1 History of Present Illness: Patient fell and twisted her ankle while pulling suit case down steps and twisted it on concrete.        Prior [place] expects to be discharged to:: Private residence Living Arrangements: Alone Type of Home: House Home Access: Level entry Home Layout: One level Home Equipment: None Additional Comments: 4 steps to enter Prior Function Level of Independence: Independent Communication Communication: No difficulties    Cognition  Cognition Arousal/Alertness: Awake/alert Behavior During Therapy: WFL for tasks assessed/performed Overall Cognitive Status: Within Functional Limits for tasks assessed    Extremity/Trunk Assessment Lower Extremity Assessment Lower Extremity Assessment: Overall WFL for tasks assessed Cervical / Trunk Assessment Cervical / Trunk Assessment: Normal   Balance Balance Overall balance assessment: Modified Independent  End of Session PT - End of Session Equipment Utilized During Treatment:  Gait belt Activity Tolerance: Patient limited by fatigue Patient left: in bed Nurse  Communication: Mobility status  GP     Dally Oshel R PT DPT 10/11/2013, 9:45 AM

## 2013-10-11 NOTE — Anesthesia Postprocedure Evaluation (Signed)
  Anesthesia Post-op Note  Patient: Claudia [name]  Procedure(s) Performed: Procedure(s): OPEN REDUCTION INTERNAL FIXATION (ORIF) ANKLE FRACTURE (Left)  Patient Location: Nursing Unit  Anesthesia Type:General  Level of Consciousness: awake, alert  and oriented  Airway and Oxygen Therapy: Patient Spontanous Breathing  Post-op Pain: none  Post-op Assessment: Post-op Vital signs reviewed, Patient's Cardiovascular Status Stable, Respiratory Function Stable, Patent Airway and No signs of Nausea or vomiting  Post-op Vital Signs: Reviewed and stable  Complications: No apparent anesthesia complications

## 2013-10-11 NOTE — Addendum Note (Signed)
Addendum created 10/11/13 1303 by Ollen Bowl, CRNA   Modules edited: Notes Section   Notes Section:  File: [id number]

## 2013-10-11 NOTE — Progress Notes (Signed)
Subjective: 1 Day Post-Op Procedure(s) (LRB): OPEN REDUCTION INTERNAL FIXATION (ORIF) ANKLE FRACTURE (Left) Patient reports pain as 5 on 0-10 scale.    Objective: Vital signs in last 24 hours: Temp:  [97.4 F (36.3 C)-98.2 F (36.8 C)] 97.5 F (36.4 C) (03/18 0511) Pulse Rate:  [65-111] 65 (03/18 0511) Resp:  [8-26] 15 (03/18 0511) BP: (110-144)/(54-89) 110/54 mmHg (03/18 0511) SpO2:  [92 %-100 %] 99 % (03/18 0511)  Intake/Output from previous day: 03/17 0701 - 03/18 0700 In: 1340 [P.O.:240; I.V.:1100] Out: 910 [Urine:900; Blood:10] Intake/Output this shift:     Recent Labs  10/10/13 0722 10/11/13 0651  HGB 14.4 12.4    Recent Labs  10/10/13 0722 10/11/13 0651  WBC 8.2 12.0*  RBC 4.64 3.98  HCT 42.3 36.9  PLT 186 184    Recent Labs  10/10/13 0722 10/11/13 0651  NA 142 143  K 4.0 4.3  CL 104 106  CO2 26 29  BUN 13 9  CREATININE 0.65 0.53  GLUCOSE 106* 116*  CALCIUM 10.2 9.0   No results found for this basename: LABPT, INR,  in the last 72 hours  Neurologically intact Neurovascular intact Sensation intact distally Intact pulses distally  Assessment/Plan: 1 Day Post-Op Procedure(s) (LRB): OPEN REDUCTION INTERNAL FIXATION (ORIF) ANKLE FRACTURE (Left) Up with therapy  Claudia [name] 10/11/2013, 7:26 AM

## 2013-10-11 NOTE — Op Note (Signed)
NAMEJUPITER, Claudia [name]              ACCOUNT NO.:  [id number]  MEDICAL RECORD NO.:  [id number]  LOCATION:  [id number]                          FACILITY:  [facility]  PHYSICIAN:  J. Sanjuana Kava, M.D. DATE OF BIRTH:  July 18, 1956  DATE OF PROCEDURE:  10/10/2013 DATE OF DISCHARGE:                              OPERATIVE REPORT   PREOPERATIVE DIAGNOSIS:  Trimalleolar fracture, left ankle.  POSTOPERATIVE DIAGNOSIS:  Trimalleolar fracture, left ankle.  PROCEDURE:  Open treatment and internal fixation of left trimalleolar fracture, both the medial and lateral sides.  Laterally, a 6-hole side plate and 6 screws; medially, 55-mm long malleolar screw and a 0.062 smooth Kirschner wire.  Posterior malleolus came into place and not needed to be fixed at this time.  ANESTHESIA:  General.  TOURNIQUET TIME:  43 minutes.  DRAINS:  No drains.  SPLINTS:  Posterior splint applied.  SURGEON:  J. Sanjuana Kava, MD  ASSISTANT:  Mikey Bussing, RN.  INDICATIONS:  The patient fell this morning when getting ready to plan to go on a trip to [facility].  She had a trimalleolar fracture with dislocation.  It was reduced in the ER.  I saw her in the ER and went over the findings and recommended surgery.  Risks and imponderables were discussed.  The patient was n.p.o.  Brought over to the operating room to perform the procedure.  She asked appropriate questions.  She appeared to understand the procedure.  DESCRIPTION OF PROCEDURE:  The patient was seen in the holding area. The left ankle was identified as correct surgical site.  I placed a mark on the left ankle area.  She was brought to the operating room, placed supine on the operating room table.  After generalized anesthesia was given, tourniquet placed and deflated on left upper thigh.  She was prepped and draped in the usual manner.  At time-out identifies the patient as Claudia [name] and we are doing left ankle fracture treatment. X-ray was in the room and  we had x-ray badges, x-ray shields, and x-ray aprons.  X-ray unit was working properly.  All instrumentation was properly positioned and working.  OR team knew each other.  Outlines for incision made and tourniquet was inflated to 300 mmHg. Esmarch bandage removed.  Medial incision made and the fracture was reduced.  A 0.062 smooth Kirschner wire was placed and a 3.2 drill was used, 55-mm long malleolar screw was inserted.  This gave very good reduction medially.  Attention directed laterally and then the fracture was anatomically reduced after being exposed.  A 6-hole side plate was used.  So-called whirlybird was used in the second from most proximal screw hole to hold the plate in position.  Cancellous screws were used on the first most proximal position and then the third, fourth, and fifth position.  The most inferior screw hole was a cancellous screw and then the so-called whirlybird was removed and a locking screw was inserted.  Permanent x-rays were taken.  Posterior fragment remained in place and did not need to be fixed.  Wounds were then reapproximated using 2-0 chromic and 2-0 plain and skin staples.  Sterile dressing applied.  Bulky dressing applied.  Posterior splint  applied.  Tourniquet deflated after 43 minutes.  The patient tolerated the procedure well and went to the recovery room in good condition.  She will be admitted to the hospital.          ______________________________ J. Sanjuana Kava, M.D.     JWK/MEDQ  D:  10/10/2013  T:  10/11/2013  Job:  [id number]

## 2013-10-11 NOTE — Progress Notes (Signed)
Physical Therapy Treatment Patient Details Name: Claudia [name] MRN: [id number] DOB: 1955-09-15 Today's Date: 10/11/2013 Time: 2542-7062 PT Time Calculation (min): 38 min  PT Assessment / Plan / Recommendation  History of Present Illness     PT Comments   Pt progressing well towards goals, pt very pleasant and eager to participate with PT today.  Continued gait training NWB Lt foot with RW total of 34 feet with SBA, min cueing required with sit to stand for hand placements for safety and to increase ease with task.  Pt left in bed per request with cryocuff placed on ankle for pain and edema control, call bell within reach.  Pt reported pain reduced to 3/10 at end of session.    Follow Up Recommendations        Does the patient have the potential to tolerate intense rehabilitation     Barriers to Discharge        Equipment Recommendations       Recommendations for Other Services    Frequency     Progress towards PT Goals    Plan Current plan remains appropriate    Precautions / Restrictions Precautions Precautions: Fall Restrictions Weight Bearing Restrictions: Yes LLE Weight Bearing: Non weight bearing       Mobility  Bed Mobility Overal bed mobility: Independent Transfers Overall transfer level: Modified independent Equipment used: Rolling walker (2 wheeled) General transfer comment: cueing for handplacement for safety Ambulation/Gait Ambulation/Gait assistance: Supervision Ambulation Distance (Feet): 34 Feet Assistive device: Rolling walker (2 wheeled) Gait Pattern/deviations: Step-to pattern Gait velocity interpretation: <1.8 ft/sec, indicative of risk for recurrent falls General Gait Details: NWB Lt LE    Exercises General Exercises - Lower Extremity Ankle Circles/Pumps: AROM;Right;10 reps;Supine Long Arc Quad: AROM;Both;10 reps;Seated Heel Slides: AAROM;Both;10 reps;Supine Hip ABduction/ADduction: AROM;Both;Seated Straight Leg Raises: AROM;Both   PT  Diagnosis:    PT Problem List:   PT Treatment Interventions:     PT Goals (current goals can now be found in the care plan section)    Visit Information  Last PT Received On: 10/11/13    Subjective Data   Pain scale 4/10 Lt ankle, pre-mediated.   Cognition  Cognition Arousal/Alertness: Awake/alert Behavior During Therapy: WFL for tasks assessed/performed Overall Cognitive Status: Within Functional Limits for tasks assessed    Balance     End of Session PT - End of Session Equipment Utilized During Treatment: Gait belt Activity Tolerance: Patient limited by fatigue Patient left: in bed;with call bell/phone within reach Nurse Communication: Mobility status   GP     Aldona Lento 10/11/2013, 4:40 PM

## 2013-10-11 NOTE — Care Management Utilization Note (Signed)
UR completed 

## 2013-10-11 NOTE — Progress Notes (Signed)
  Followup consult NOTE  Claudia [name]:[id number] DOB: Sep 21, 1955 DOA: 10/10/2013 PCP: No primary provider on file.  Summary: 58 year old woman with history of anxiety and depression admitted by the orthopedic service after sustaining displaced ankle fracture. Hospital service was consulted for medical management.  Assessment/Plan: 1. Depression, Panic disorder. Appears stable. Recommend continuing Abilify, Zoloft. She reports being on Xanax 2 mg 4 times a day, she has been taking this medication for 20 years. Therefore this point continue current medication. 2. Right ankle trimalleolar fracture with dislocation status post ORIF. 3. Tobacco dependence   Depression and panic disorder appears stable. No new recommendations. I will sign off. Please call if I can be of further assistance.  Murray Hodgkins, MD  [facility]  Pager [phone] If 7PM-7AM, please contact night-coverage at [email or web address], password [facility] 10/11/2013, 12:21 PM  LOS: 1 day   HPI/Subjective: Overall doing well. She reports a history of a nervous breakdown last year. She has been taking Xanax for 20 years.  Objective: Filed Vitals:   10/11/13 0400 10/11/13 0511 10/11/13 0800 10/11/13 0915  BP:  110/54    Pulse:  65  66  Temp:  97.5 F (36.4 C)    TempSrc:  Oral    Resp: 16 15 27    Height:      Weight:      SpO2: 97% 99% 97% 99%    Intake/Output Summary (Last 24 hours) at 10/11/13 1221 Last data filed at 10/11/13 0516  Gross per 24 hour  Intake    340 ml  Output    900 ml  Net   -560 ml     Filed Weights   10/10/13 0704  Weight: 83.915 kg (185 lb)    Exam:   Afebrile, vital signs are stable. No hypoxia.  Gen. Appears calm and comfortable. Speech fluent and clear.  Cardiovascular regular rate and rhythm. No murmur or gallop.  Respiratory clear to auscultation bilaterally. No wheezes, rales or rhonchi. Normal respiratory effort.  Psychiatric grossly normal mood and affect. Speech  fluent and appropriate.  Data Reviewed:  Basic metabolic panel unremarkable. CBC noted. Minimal leukocytosis, likely related to surgery.  Scheduled Meds: . alprazolam  2 mg Oral QID  . ARIPiprazole  5 mg Oral Daily  . enoxaparin (LOVENOX) injection  40 mg Subcutaneous Q24H  . ezetimibe  10 mg Oral q1800  . feeding supplement (ENSURE COMPLETE)  237 mL Oral Q24H  . morphine   Intravenous 6 times per day  . omega-3 acid ethyl esters  2 g Oral Daily  . pantoprazole  40 mg Oral Daily  . sertraline  200 mg Oral Daily  . zolpidem  5 mg Oral QHS   Continuous Infusions: . dextrose 5 % and 0.45% NaCl 50 mL/hr at 10/11/13 0532    Active Problems:   Closed trimalleolar fracture of left ankle   Time spent 25 minutes

## 2013-10-12 ENCOUNTER — Encounter (HOSPITAL_COMMUNITY): Payer: Self-pay | Admitting: Orthopaedic Surgery

## 2013-10-12 LAB — CBC WITH DIFFERENTIAL/PLATELET
Basophils Absolute: 0 10*3/uL (ref 0.0–0.1)
Basophils Relative: 0 % (ref 0–1)
Eosinophils Absolute: 0.1 10*3/uL (ref 0.0–0.7)
Eosinophils Relative: 1 % (ref 0–5)
HEMATOCRIT: 35.5 % — AB (ref 36.0–46.0)
HEMOGLOBIN: 11.8 g/dL — AB (ref 12.0–15.0)
LYMPHS ABS: 2.7 10*3/uL (ref 0.7–4.0)
Lymphocytes Relative: 30 % (ref 12–46)
MCH: 31.3 pg (ref 26.0–34.0)
MCHC: 33.2 g/dL (ref 30.0–36.0)
MCV: 94.2 fL (ref 78.0–100.0)
MONO ABS: 0.7 10*3/uL (ref 0.1–1.0)
MONOS PCT: 7 % (ref 3–12)
NEUTROS ABS: 5.6 10*3/uL (ref 1.7–7.7)
NEUTROS PCT: 62 % (ref 43–77)
Platelets: 154 10*3/uL (ref 150–400)
RBC: 3.77 MIL/uL — AB (ref 3.87–5.11)
RDW: 13.2 % (ref 11.5–15.5)
WBC: 9.1 10*3/uL (ref 4.0–10.5)

## 2013-10-12 LAB — BASIC METABOLIC PANEL
BUN: 12 mg/dL (ref 6–23)
CO2: 31 mEq/L (ref 19–32)
Calcium: 8.6 mg/dL (ref 8.4–10.5)
Chloride: 105 mEq/L (ref 96–112)
Creatinine, Ser: 0.6 mg/dL (ref 0.50–1.10)
GFR calc non Af Amer: >90 mL/min (ref >90)
GLUCOSE: 95 mg/dL (ref 70–99)
POTASSIUM: 4.1 meq/L (ref 3.7–5.3)
SODIUM: 144 meq/L (ref 137–147)

## 2013-10-12 MED ORDER — OXYCODONE-ACETAMINOPHEN 7.5-325 MG PO TABS: 1.0000 | ORAL_TABLET | ORAL | Status: DC | PRN

## 2013-10-12 MED ORDER — OXYCODONE-ACETAMINOPHEN 5-325 MG PO TABS
1.0000 | ORAL_TABLET | Freq: Once | ORAL | Status: AC
  Administered 2013-10-12: 1 via ORAL

## 2013-10-12 NOTE — Progress Notes (Signed)
Physical Therapy Treatment Patient Details Name: Claudia [name] MRN: [id number] DOB: 03/22/1956 Today's Date: 10/12/2013 Time: 9528-4132 PT Time Calculation (min): 42 min  PT Assessment / Plan / Recommendation  History of Present Illness     PT Comments   Pt able to demonstrate safe mechanics ambulating with RW with SBA.  Instructed stair training, pt unable to ascend stairs with NWB Lt LE, UE not strong enough to lift up body weight.  Pt reported she would enter home from back, no stairs at that entrance.    Follow Up Recommendations        Does the patient have the potential to tolerate intense rehabilitation     Barriers to Discharge        Equipment Recommendations       Recommendations for Other Services    Frequency     Progress towards PT Goals Progress towards PT goals: Progressing toward goals  Plan Current plan remains appropriate    Precautions / Restrictions Precautions Precautions: Fall Restrictions Weight Bearing Restrictions: Yes LLE Weight Bearing: Non weight bearing    Mobility  Bed Mobility Overal bed mobility: Independent Transfers Equipment used: Rolling walker (2 wheeled) General transfer comment: cueing for handplacement for safety Ambulation/Gait Ambulation/Gait assistance: Supervision Ambulation Distance (Feet): 46 Feet Assistive device: Rolling walker (2 wheeled) Gait Pattern/deviations: Step-to pattern Gait velocity interpretation: <1.8 ft/sec, indicative of risk for recurrent falls General Gait Details: NWB Lt LE Stairs: Yes Stairs assistance:  (Demonstrated... pt unable to complete) General stair comments: PTA demonstration and worksheet given for proper mechanics ascending/descending stairs    Exercises General Exercises - Lower Extremity Ankle Circles/Pumps: AROM;Right;10 reps;Seated Long Arc Quad: AROM;Both;10 reps;Seated   PT Diagnosis:    PT Problem List:   PT Treatment Interventions:     PT Goals (current goals can now be  found in the care plan section)    Visit Information  Last PT Received On: 10/12/13    Subjective Data    Pain free with no movement.   Cognition  Cognition Arousal/Alertness: Awake/alert Behavior During Therapy: WFL for tasks assessed/performed Overall Cognitive Status: Within Functional Limits for tasks assessed    Balance     End of Session PT - End of Session Equipment Utilized During Treatment: Gait belt Activity Tolerance: Patient limited by fatigue Patient left: in chair;with call bell/phone within reach;with family/visitor present;with nursing/sitter in room Nurse Communication: Mobility status   GP     Aldona Lento 10/12/2013, 12:43 PM

## 2013-10-12 NOTE — Plan of Care (Signed)
Problem: Acute Rehab PT Goals(only PT should resolve) Goal: Pt Will Go Up/Down Stairs Outcome: Not Met (add Reason) Pt unable to complete stair training with Lt LE NWB.  Therapist demonstration and pt able verbally report understanding of technique.

## 2013-10-12 NOTE — Care Management Note (Signed)
    Page 1 of 2   10/12/2013     10:30:29 AM   CARE MANAGEMENT NOTE 10/12/2013  Patient:  ESBEYDI, MANAGO   Account Number:  [id number]  Date Initiated:  10/12/2013  Documentation initiated by:  Claretha Cooper  Subjective/Objective Assessment:   Pt admitted from home. ORIF of ankle and will need rolling walker and 3 & 1. HH PT. Pt selected all DME and RN from [facility]     Action/Plan:   Anticipated DC Date:  10/12/2013   Anticipated DC Plan:  [place]  CM consult      PAC Choice  [place]   Choice offered to / List presented to:  C-1 Patient   DME arranged  [facility]  3-N-1      DME agency  [place] arranged  [place].   Status of service:  Completed, signed off Medicare Important Message given?   (If response is "NO", the following Medicare IM given date fields will be blank) Date Medicare IM given:   Date Additional Medicare IM given:    Discharge Disposition:  [place]  Per UR Regulation:    If discussed at Long Length of Stay Meetings, dates discussed:    Comments:  10/12/13 Claretha Cooper RN BSN CM

## 2013-10-12 NOTE — Discharge Instructions (Signed)
Elevate left foot and ankle.  Keep wound dry.  Do not remove the dressings.  Home health has been arranged for therapy at your home.  Appointment to Dr. Luna Glasgow in two weeks.  Call his office to make appointment, [phone].  If any problem, call Dr. Brooke Bonito office at [phone] or if after hours, the hospital at [phone].

## 2013-10-12 NOTE — Progress Notes (Signed)
Patient with orders to be discharge home. Discharge instructions given, patient verbalized understanding. Equipment ie bedside commode and walker delivered to patient prior to discharge. Prescriptions given. Patient stable. Patient left in private vehicle with friend.

## 2013-10-12 NOTE — Discharge Summary (Signed)
Physician Discharge Summary  Patient ID: Claudia [name] MRN: [id number] DOB/AGE: 12/25/1955 58 y.o.  Admit date: 10/10/2013 Discharge date: 10/12/2013  Admission Diagnoses: Trimalleolar fracture of the left ankle with dislocation  Discharge Diagnoses: Trimalleolar fracture of the left ankle with dislocation Active Problems:   Closed trimalleolar fracture of left ankle   Discharged Condition: good  Hospital Course: She fell at home on the day of admission.   She was seen in the ER and had x-rays showing a dislocation of the left ankle with trimalleolar fracture.  The ER physician did a closed reduction and applied a splint.  I took her to surgery where she had OTIF of the fractures.  She is in a posterior splint.  She has done well.  Pain has been controlled.  She has been seen by physical therapy and is using a walker with toe touch weight bearing.  Vital signs remain normal.  She has been set up for home health PT.    Consults: Hospitalist.  Significant Diagnostic Studies: labs: normal and radiology: X-Ray: of left ankle showing fractures and dislocation, post reduction and post surgery.  Treatments: surgery: open treatment and internal reduction of the trimalleolar fracture of the left ankle  Discharge Exam: Blood pressure 141/86, pulse 85, temperature 98.2 F (36.8 C), temperature source Oral, resp. rate 18, height 5\' 4"  (1.626 m), weight 83.915 kg (185 lb), SpO2 100.00%. General appearance: alert and cooperative.  Vital signs normal.  She has normal neurovascular status of the left ankle in a posterior splint.  Disposition: 01-Home or Self Care  Discharge Orders   Future Orders Complete By Expires   Call MD / Call 911  As directed    Comments:     If you experience chest pain or shortness of breath, CALL 911 and be transported to the hospital emergency room.  If you develope a fever above 101 F, pus (white drainage) or increased drainage or redness at the wound, or calf pain,  call your surgeon's office.   Constipation Prevention  As directed    Comments:     Drink plenty of fluids.  Prune juice may be helpful.  You may use a stool softener, such as Colace (over the counter) 100 mg twice a day.  Use MiraLax (over the counter) for constipation as needed.   Diet - low sodium heart healthy  As directed    Discharge instructions  As directed    Comments:     Elevate left foot.  Use ice or the cryocuff.  Use walker, toe touch on the let.  Physical therapy via home health will see you in your home for therapy.  If any problems, call Dr. Brooke Bonito office at [phone] or if after hours, hospital at [phone].  Keep appointment to see Dr. Luna Glasgow in two weeks.  Call his office at [phone] for appointment.   Increase activity slowly as tolerated  As directed        Medication List    STOP taking these medications       HYDROcodone-acetaminophen 10-325 MG per tablet  Commonly known as:  NORCO      TAKE these medications       alprazolam 2 MG tablet  Commonly known as:  XANAX  Take 2 mg by mouth 4 (four) times daily.     ARIPiprazole 5 MG tablet  Commonly known as:  ABILIFY  Take 5 mg by mouth daily.     aspirin EC 81 MG tablet  Take 81  mg by mouth daily as needed for moderate pain.     ezetimibe 10 MG tablet  Commonly known as:  ZETIA  Take 1 tablet (10 mg total) by mouth daily.     fish oil-omega-3 fatty acids 1000 MG capsule  Take 2 capsules (2 g total) by mouth daily.     hydrocortisone 2.5 % cream  Apply 1 application topically See admin instructions. 2-3 times daily as needed for eczema on face     omeprazole 20 MG capsule  Commonly known as:  PRILOSEC  Take 20 mg by mouth daily as needed. For acid reflux     oxyCODONE-acetaminophen 7.5-325 MG per tablet  Commonly known as:  PERCOCET  Take 1 tablet by mouth every 4 (four) hours as needed for pain.     sertraline 100 MG tablet  Commonly known as:  ZOLOFT  Take 200 mg by mouth daily.      zolpidem 10 MG tablet  Commonly known as:  AMBIEN  Take 1 tablet by mouth at bedtime.           Follow-up Information   Follow up with Sanjuana Kava, MD In 2 weeks.   Specialty:  Orthopedic Surgery   Contact information:   [place] [address] [phone]       Signed: Sanjuana Kava 10/12/2013, 7:55 AM

## 2013-10-12 NOTE — Progress Notes (Signed)
Subjective: 2 Days Post-Op Procedure(s) (LRB): OPEN REDUCTION INTERNAL FIXATION (ORIF) ANKLE FRACTURE (Left) Patient reports pain as 3 on 0-10 scale.    Objective: Vital signs in last 24 hours: Temp:  [97.6 F (36.4 C)-98.8 F (37.1 C)] 98.2 F (36.8 C) (03/19 0402) Pulse Rate:  [66-92] 85 (03/19 0402) Resp:  [12-27] 18 (03/19 0402) BP: (105-141)/(53-86) 141/86 mmHg (03/19 0402) SpO2:  [93 %-100 %] 100 % (03/19 0402)  Intake/Output from previous day: 03/18 0701 - 03/19 0700 In: 2300.8 [P.O.:920; I.V.:1380.8] Out: 600 [Urine:600] Intake/Output this shift:     Recent Labs  10/10/13 0722 10/11/13 0651 10/12/13 0546  HGB 14.4 12.4 11.8*    Recent Labs  10/11/13 0651 10/12/13 0546  WBC 12.0* 9.1  RBC 3.98 3.77*  HCT 36.9 35.5*  PLT 184 154    Recent Labs  10/11/13 0651 10/12/13 0546  NA 143 144  K 4.3 4.1  CL 106 105  CO2 29 31  BUN 9 12  CREATININE 0.53 0.60  GLUCOSE 116* 95  CALCIUM 9.0 8.6   No results found for this basename: LABPT, INR,  in the last 72 hours  Neurologically intact ABD soft Sensation intact distally Intact pulses distally  She did well with PT yesterday.  To discharge home today. Assessment/Plan: 2 Days Post-Op Procedure(s) (LRB): OPEN REDUCTION INTERNAL FIXATION (ORIF) ANKLE FRACTURE (Left) Discharge home with home health  Claudia [name] 10/12/2013, 7:46 AM

## 2013-10-17 NOTE — Care Management Note (Signed)
UR completed 

## 2013-12-26 ENCOUNTER — Other Ambulatory Visit (HOSPITAL_COMMUNITY): Payer: Self-pay | Admitting: Orthopaedic Surgery

## 2013-12-26 ENCOUNTER — Ambulatory Visit (HOSPITAL_COMMUNITY)
Admission: RE | Admit: 2013-12-26 | Discharge: 2013-12-26 | Disposition: A | Discharge status: Home / Self Care | Payer: BC Managed Care – PPO | Source: Ambulatory Visit | Attending: Orthopaedic Surgery | Admitting: Orthopaedic Surgery

## 2013-12-26 DIAGNOSIS — M25572 Pain in left ankle and joints of left foot: Secondary | ICD-10-CM

## 2013-12-26 DIAGNOSIS — M25473 Effusion, unspecified ankle: Secondary | ICD-10-CM | POA: Insufficient documentation

## 2013-12-26 DIAGNOSIS — M25476 Effusion, unspecified foot: Secondary | ICD-10-CM | POA: Insufficient documentation

## 2013-12-26 DIAGNOSIS — Z8781 Personal history of (healed) traumatic fracture: Secondary | ICD-10-CM

## 2013-12-26 DIAGNOSIS — M79672 Pain in left foot: Secondary | ICD-10-CM

## 2013-12-26 DIAGNOSIS — IMO0001 Reserved for inherently not codable concepts without codable children: Secondary | ICD-10-CM | POA: Insufficient documentation

## 2013-12-26 DIAGNOSIS — I10 Essential (primary) hypertension: Secondary | ICD-10-CM

## 2013-12-26 DIAGNOSIS — M7730 Calcaneal spur, unspecified foot: Secondary | ICD-10-CM | POA: Insufficient documentation

## 2014-05-21 ENCOUNTER — Other Ambulatory Visit: Payer: Self-pay | Admitting: Radiology

## 2014-05-22 ENCOUNTER — Encounter (HOSPITAL_COMMUNITY): Payer: Self-pay | Admitting: Pharmacy Technician

## 2014-05-23 NOTE — Patient Instructions (Signed)
[name]  05/23/2014   Your procedure is scheduled on:  05/29/2014  Report to [facility] at 6:15 AM.  Call this number if you have problems the morning of surgery: [phone]   Remember:   Do not eat food or drink liquids after midnight.   Take these medicines the morning of surgery with A SIP OF WATER:    XANAX,ABILIFY,PRILOSEC,OXYCODONE,ZOLOFT   Do not wear jewelry, make-up or nail polish.  Do not wear lotions, powders, or perfumes. You may wear deodorant.  Do not shave 48 hours prior to surgery. Men may shave face and neck.  Do not bring valuables to the hospital.  [facility] is not responsible for any belongings or valuables.               Contacts, dentures or bridgework may not be worn into surgery.  Leave suitcase in the car. After surgery it may be brought to your room.  For patients admitted to the hospital, discharge time is determined by your treatment team.               Patients discharged the day of surgery will not be allowed to drive home.  Name and phone number of your driver:   Special Instructions: Shower using CHG 2 nights before surgery and the night before surgery.  If you shower the day of surgery use CHG.  Use special wash - you have one bottle of CHG for all showers.  You should use approximately 1/3 of the bottle for each shower.   Please read over the following fact sheets that you were given: Surgical Site Infection Prevention and Anesthesia Post-op Instructions   PATIENT INSTRUCTIONS POST-ANESTHESIA  IMMEDIATELY FOLLOWING SURGERY:  Do not drive or operate machinery for the first twenty four hours after surgery.  Do not make any important decisions for twenty four hours after surgery or while taking narcotic pain medications or sedatives.  If you develop intractable nausea and vomiting or a severe headache please notify your doctor immediately.  FOLLOW-UP:  Please make an appointment with your surgeon as instructed. You do not need to follow up  with anesthesia unless specifically instructed to do so.  WOUND CARE INSTRUCTIONS (if applicable):  Keep a dry clean dressing on the anesthesia/puncture wound site if there is drainage.  Once the wound has quit draining you may leave it open to air.  Generally you should leave the bandage intact for twenty four hours unless there is drainage.  If the epidural site drains for more than 36-48 hours please call the anesthesia department.  QUESTIONS?:  Please feel free to call your physician or the hospital operator if you have any questions, and they will be happy to assist you.

## 2014-05-24 ENCOUNTER — Encounter (HOSPITAL_COMMUNITY)
Admission: RE | Admit: 2014-05-24 | Discharge: 2014-05-24 | Disposition: A | Discharge status: Home / Self Care | Payer: BC Managed Care – PPO | Source: Ambulatory Visit | Attending: Orthopaedic Surgery | Admitting: Orthopaedic Surgery

## 2014-05-24 ENCOUNTER — Encounter (HOSPITAL_COMMUNITY): Payer: Self-pay

## 2014-05-24 ENCOUNTER — Other Ambulatory Visit: Payer: Self-pay

## 2014-05-24 DIAGNOSIS — Z01818 Encounter for other preprocedural examination: Secondary | ICD-10-CM | POA: Insufficient documentation

## 2014-05-24 HISTORY — DX: Nausea with vomiting, unspecified: R11.2

## 2014-05-24 HISTORY — DX: Other specified postprocedural states: Z98.890

## 2014-05-24 LAB — COMPREHENSIVE METABOLIC PANEL
ALT: 17 U/L (ref 0–35)
ANION GAP: 12 (ref 5–15)
AST: 15 U/L (ref 0–37)
Albumin: 3.5 g/dL (ref 3.5–5.2)
Alkaline Phosphatase: 95 U/L (ref 39–117)
BILIRUBIN TOTAL: 0.4 mg/dL (ref 0.3–1.2)
BUN: 15 mg/dL (ref 6–23)
CO2: 23 meq/L (ref 19–32)
CREATININE: 0.66 mg/dL (ref 0.50–1.10)
Calcium: 9.3 mg/dL (ref 8.4–10.5)
Chloride: 108 mEq/L (ref 96–112)
GFR calc Af Amer: >90 mL/min (ref >90)
Glucose, Bld: 105 mg/dL — ABNORMAL HIGH (ref 70–99)
Potassium: 3.9 mEq/L (ref 3.7–5.3)
Sodium: 143 mEq/L (ref 137–147)
Total Protein: 7.3 g/dL (ref 6.0–8.3)

## 2014-05-24 LAB — URINALYSIS, ROUTINE W REFLEX MICROSCOPIC
Bilirubin Urine: NEGATIVE
Glucose, UA: NEGATIVE mg/dL
KETONES UR: NEGATIVE mg/dL
Leukocytes, UA: NEGATIVE
NITRITE: NEGATIVE
PROTEIN: NEGATIVE mg/dL
Specific Gravity, Urine: >1.03 — ABNORMAL HIGH (ref 1.005–1.030)
Urobilinogen, UA: 0.2 mg/dL (ref 0.0–1.0)
pH: 5 (ref 5.0–8.0)

## 2014-05-24 LAB — CBC WITH DIFFERENTIAL/PLATELET
BASOS PCT: 0 % (ref 0–1)
Basophils Absolute: 0 10*3/uL (ref 0.0–0.1)
EOS PCT: 2 % (ref 0–5)
Eosinophils Absolute: 0.1 10*3/uL (ref 0.0–0.7)
HCT: 41.4 % (ref 36.0–46.0)
Hemoglobin: 13.7 g/dL (ref 12.0–15.0)
LYMPHS ABS: 2.3 10*3/uL (ref 0.7–4.0)
Lymphocytes Relative: 31 % (ref 12–46)
MCH: 30.2 pg (ref 26.0–34.0)
MCHC: 33.1 g/dL (ref 30.0–36.0)
MCV: 91.4 fL (ref 78.0–100.0)
Monocytes Absolute: 0.4 10*3/uL (ref 0.1–1.0)
Monocytes Relative: 6 % (ref 3–12)
NEUTROS PCT: 61 % (ref 43–77)
Neutro Abs: 4.5 10*3/uL (ref 1.7–7.7)
PLATELETS: 201 10*3/uL (ref 150–400)
RBC: 4.53 MIL/uL (ref 3.87–5.11)
RDW: 13.8 % (ref 11.5–15.5)
WBC: 7.4 10*3/uL (ref 4.0–10.5)

## 2014-05-24 LAB — URINE MICROSCOPIC-ADD ON

## 2014-05-28 NOTE — H&P (Signed)
Claudia [name] is an 58 y.o. female.   Chief Complaint: Pain left ankle HPI: In March of this year she fell at home while getting ready to go to [place], [place] for a trip.  She sustained a trimalleolar fracture of the left ankle.  She had surgery on it by me later that day.  She was admitted after surgery and had good pain control. She was seen by PT and used a walker with a posterior splint.  I saw her about two weeks after discharge in the office and she was doing well.  Sutures were removed and a new cast applied.  About a month after that the cast was removed and she began weight bearing in PT.  She did well but has continued to have pain medially.  X-rays have shown very good healing of the lateral malleolus and posterior malleolus fractures.  However, she still has a persistent fracture line present medially.  The mortise has remained intact.  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  I have discussed with her the findings and she has seen the x-rays.  I have recommended new surgery with bone grafting and surgical fixation.  She was told of the risks and imponderables.  She will be admitted after surgery. She will have pain and will need PT again with limited weight bearing for the six to eight weeks for the fracture to heal.  She has a chance of infection, embolus and also anesthesia risks.  She asked appropriate questions.  She agrees to procedure.    Past Medical History  Diagnosis Date  . Anxiety   . Depression   . Hypertension   . Breast cancer   . GERD (gastroesophageal reflux disease)   . Cancer of right breast     mastectomy  . Irritable bowel   . Hiatal hernia   . Hypercholesterolemia   . PONV (postoperative nausea and vomiting)     Past Surgical History  Procedure Laterality Date  . Mastectomy    . Tubal ligation    . Abdominal hysterectomy    . Total abdominal hysterectomy w/ bilateral salpingoophorectomy    . Tonsillectomy    .  Cholecystectomy    . Foot surgery      bilateral spur removal  . Orif ankle fracture Left 10/10/2013    Procedure: OPEN REDUCTION INTERNAL FIXATION (ORIF) ANKLE FRACTURE;  Surgeon: Sanjuana Kava, MD;  Location: [facility];  Service: Orthopedics;  Laterality: Left;    Family History  Problem Relation Age of Onset  . Liver disease Mother   . Hypertension Father   . Heart attack Sister    Social History:  reports that she has been smoking Cigarettes.  She has a 40 pack-year smoking history. She does not have any smokeless tobacco history on file. She reports that she does not drink alcohol or use illicit drugs.  Allergies:  Allergies  Allergen Reactions  . Statins Other (See Comments)    Muscle pain    No prescriptions prior to admission    No results found for this or any previous visit (from the past 48 hour(s)). No results found.  Review of Systems  Musculoskeletal: Positive for joint pain (Left ankle) and falls (Originally fell in March of this year and hurt the left ankle.).    There were no vitals taken for this visit. Physical Exam  Constitutional: She is oriented to person, place, and time. She appears well-developed and well-nourished.  HENT:  Head: Normocephalic and atraumatic.  Eyes: Conjunctivae and EOM are normal. Pupils are equal, round, and reactive to light.  Neck: Normal range of motion. Neck supple.  Cardiovascular: Normal rate, regular rhythm, normal heart sounds and intact distal pulses.   Respiratory: Effort normal and breath sounds normal.  GI: Soft.  Musculoskeletal: She exhibits tenderness (Pain medial side of left ankle. ROM is very good.  Well healed medial and lateral scars.  She has some edema of the ankle and left foot.  NV isi ntact.).       Feet:  Neurological: She is alert and oriented to person, place, and time. She has normal reflexes.  Skin: Skin is warm and dry.  Psychiatric: She has a normal mood and affect. Her behavior is normal. Judgment  and thought content normal.     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  The lateral and posterior malleolus have healed.  For surgery to fix medial side with bone grafting.  She will be admitted post surgery.  Cathie Bonnell 05/28/2014, 1:45 PM

## 2014-05-29 ENCOUNTER — Inpatient Hospital Stay (HOSPITAL_COMMUNITY): Payer: BC Managed Care – PPO | Admitting: Anesthesiology

## 2014-05-29 ENCOUNTER — Encounter (HOSPITAL_COMMUNITY): Payer: Self-pay | Admitting: *Deleted

## 2014-05-29 ENCOUNTER — Encounter (HOSPITAL_COMMUNITY)
Admission: RE | Disposition: A | Discharge status: Home Health | Payer: Self-pay | Source: Ambulatory Visit | Attending: Orthopaedic Surgery

## 2014-05-29 ENCOUNTER — Inpatient Hospital Stay (HOSPITAL_COMMUNITY): Payer: BC Managed Care – PPO

## 2014-05-29 ENCOUNTER — Inpatient Hospital Stay (HOSPITAL_COMMUNITY)
Admission: RE | Admit: 2014-05-29 | Discharge: 2014-05-31 | DRG: 494 | Disposition: A | Discharge status: Home Health | Payer: BC Managed Care – PPO | Source: Ambulatory Visit | Attending: Orthopaedic Surgery | Admitting: Orthopaedic Surgery

## 2014-05-29 DIAGNOSIS — W19XXXD Unspecified fall, subsequent encounter: Secondary | ICD-10-CM | POA: Diagnosis present

## 2014-05-29 DIAGNOSIS — F419 Anxiety disorder, unspecified: Secondary | ICD-10-CM | POA: Diagnosis present

## 2014-05-29 DIAGNOSIS — S8252XG Displaced fracture of medial malleolus of left tibia, subsequent encounter for closed fracture with delayed healing: Secondary | ICD-10-CM

## 2014-05-29 DIAGNOSIS — F17210 Nicotine dependence, cigarettes, uncomplicated: Secondary | ICD-10-CM | POA: Diagnosis present

## 2014-05-29 DIAGNOSIS — Z853 Personal history of malignant neoplasm of breast: Secondary | ICD-10-CM | POA: Diagnosis not present

## 2014-05-29 DIAGNOSIS — M79672 Pain in left foot: Secondary | ICD-10-CM | POA: Diagnosis present

## 2014-05-29 DIAGNOSIS — I10 Essential (primary) hypertension: Secondary | ICD-10-CM | POA: Diagnosis present

## 2014-05-29 DIAGNOSIS — K219 Gastro-esophageal reflux disease without esophagitis: Secondary | ICD-10-CM | POA: Diagnosis present

## 2014-05-29 DIAGNOSIS — S82892D Other fracture of left lower leg, subsequent encounter for closed fracture with routine healing: Secondary | ICD-10-CM

## 2014-05-29 DIAGNOSIS — F329 Major depressive disorder, single episode, unspecified: Secondary | ICD-10-CM | POA: Diagnosis present

## 2014-05-29 DIAGNOSIS — E780 Pure hypercholesterolemia: Secondary | ICD-10-CM | POA: Diagnosis present

## 2014-05-29 DIAGNOSIS — K589 Irritable bowel syndrome without diarrhea: Secondary | ICD-10-CM | POA: Diagnosis present

## 2014-05-29 DIAGNOSIS — S82892A Other fracture of left lower leg, initial encounter for closed fracture: Secondary | ICD-10-CM

## 2014-05-29 HISTORY — PX: HARDWARE REMOVAL: SHX979

## 2014-05-29 HISTORY — PX: ORIF ANKLE FRACTURE: SHX5408

## 2014-05-29 SURGERY — OPEN REDUCTION INTERNAL FIXATION (ORIF) ANKLE FRACTURE
Anesthesia: General | Site: Ankle | Laterality: Left

## 2014-05-29 MED ORDER — SUCCINYLCHOLINE CHLORIDE 20 MG/ML IJ SOLN
INTRAMUSCULAR | Status: DC | PRN
  Administered 2014-05-29: 140 mg via INTRAVENOUS

## 2014-05-29 MED ORDER — DEXAMETHASONE SODIUM PHOSPHATE 4 MG/ML IJ SOLN
INTRAMUSCULAR | Status: AC
  Filled 2014-05-29: qty 1

## 2014-05-29 MED ORDER — FENTANYL CITRATE 0.05 MG/ML IJ SOLN
25.0000 ug | INTRAMUSCULAR | Status: DC | PRN
  Administered 2014-05-29 (×2): 50 ug via INTRAVENOUS
  Filled 2014-05-29 (×2): qty 2

## 2014-05-29 MED ORDER — ALBUTEROL SULFATE (2.5 MG/3ML) 0.083% IN NEBU
2.5000 mg | INHALATION_SOLUTION | Freq: Four times a day (QID) | RESPIRATORY_TRACT | Status: DC
  Administered 2014-05-29: 2.5 mg via RESPIRATORY_TRACT
  Filled 2014-05-29: qty 3

## 2014-05-29 MED ORDER — SODIUM CHLORIDE 0.9 % IJ SOLN: 9.0000 mL | INTRAMUSCULAR | Status: DC | PRN

## 2014-05-29 MED ORDER — LIDOCAINE HCL (CARDIAC) 10 MG/ML IV SOLN
INTRAVENOUS | Status: DC | PRN
  Administered 2014-05-29: 50 mg via INTRAVENOUS

## 2014-05-29 MED ORDER — LACTATED RINGERS IV SOLN
INTRAVENOUS | Status: DC
  Administered 2014-05-29: 07:00:00 via INTRAVENOUS

## 2014-05-29 MED ORDER — ALBUTEROL SULFATE (2.5 MG/3ML) 0.083% IN NEBU: 2.5000 mg | INHALATION_SOLUTION | RESPIRATORY_TRACT | Status: DC | PRN

## 2014-05-29 MED ORDER — OXYCODONE HCL 5 MG PO TABS
2.5000 mg | ORAL_TABLET | ORAL | Status: DC | PRN
  Administered 2014-05-30 – 2014-05-31 (×8): 2.5 mg via ORAL
  Filled 2014-05-29 (×9): qty 1

## 2014-05-29 MED ORDER — ONDANSETRON HCL 4 MG/2ML IJ SOLN: 4.0000 mg | Freq: Four times a day (QID) | INTRAMUSCULAR | Status: DC | PRN

## 2014-05-29 MED ORDER — PROPOFOL 10 MG/ML IV BOLUS
INTRAVENOUS | Status: DC | PRN
  Administered 2014-05-29: 150 mg via INTRAVENOUS

## 2014-05-29 MED ORDER — PROPOFOL 10 MG/ML IV BOLUS
INTRAVENOUS | Status: AC
  Filled 2014-05-29: qty 20

## 2014-05-29 MED ORDER — OXYCODONE-ACETAMINOPHEN 7.5-325 MG PO TABS: 1.0000 | ORAL_TABLET | ORAL | Status: DC | PRN

## 2014-05-29 MED ORDER — LACTATED RINGERS IV SOLN
INTRAVENOUS | Status: DC | PRN
  Administered 2014-05-29 (×2): via INTRAVENOUS

## 2014-05-29 MED ORDER — LIDOCAINE HCL (PF) 1 % IJ SOLN
INTRAMUSCULAR | Status: AC
  Filled 2014-05-29: qty 5

## 2014-05-29 MED ORDER — CEFAZOLIN SODIUM-DEXTROSE 2-3 GM-% IV SOLR
INTRAVENOUS | Status: DC | PRN
  Administered 2014-05-29: 2 g via INTRAVENOUS

## 2014-05-29 MED ORDER — MIDAZOLAM HCL 2 MG/2ML IJ SOLN
INTRAMUSCULAR | Status: AC
  Filled 2014-05-29: qty 2

## 2014-05-29 MED ORDER — ASPIRIN EC 81 MG PO TBEC
81.0000 mg | DELAYED_RELEASE_TABLET | Freq: Every day | ORAL | Status: DC
  Administered 2014-05-29 – 2014-05-31 (×3): 81 mg via ORAL
  Filled 2014-05-29 (×3): qty 1

## 2014-05-29 MED ORDER — SODIUM CHLORIDE 0.9 % IR SOLN
Status: DC | PRN
  Administered 2014-05-29: 1000 mL

## 2014-05-29 MED ORDER — ALPRAZOLAM 1 MG PO TABS
2.0000 mg | ORAL_TABLET | Freq: Four times a day (QID) | ORAL | Status: DC
  Administered 2014-05-29 – 2014-05-31 (×8): 2 mg via ORAL
  Filled 2014-05-29 (×8): qty 2

## 2014-05-29 MED ORDER — HYDROCORTISONE 1 % EX CREA: TOPICAL_CREAM | Freq: Three times a day (TID) | CUTANEOUS | Status: DC | PRN

## 2014-05-29 MED ORDER — ONDANSETRON HCL 4 MG/2ML IJ SOLN: 4.0000 mg | Freq: Once | INTRAMUSCULAR | Status: DC | PRN

## 2014-05-29 MED ORDER — ONDANSETRON HCL 4 MG/2ML IJ SOLN
INTRAMUSCULAR | Status: AC
  Filled 2014-05-29: qty 2

## 2014-05-29 MED ORDER — HYDROCORTISONE 2.5 % EX CREA: 1.0000 "application " | TOPICAL_CREAM | CUTANEOUS | Status: DC

## 2014-05-29 MED ORDER — ONDANSETRON HCL 4 MG/2ML IJ SOLN
4.0000 mg | Freq: Once | INTRAMUSCULAR | Status: AC
  Administered 2014-05-29: 4 mg via INTRAVENOUS

## 2014-05-29 MED ORDER — FENTANYL CITRATE 0.05 MG/ML IJ SOLN
INTRAMUSCULAR | Status: AC
  Filled 2014-05-29: qty 5

## 2014-05-29 MED ORDER — MORPHINE SULFATE (PF) 1 MG/ML IV SOLN
INTRAVENOUS | Status: DC
  Administered 2014-05-29: 1.5 mg via INTRAVENOUS
  Administered 2014-05-29: 4.5 mg via INTRAVENOUS
  Administered 2014-05-29: 11:00:00 via INTRAVENOUS

## 2014-05-29 MED ORDER — DIPHENHYDRAMINE HCL 50 MG/ML IJ SOLN: 12.5000 mg | Freq: Four times a day (QID) | INTRAMUSCULAR | Status: DC | PRN

## 2014-05-29 MED ORDER — MORPHINE SULFATE (PF) 1 MG/ML IV SOLN
INTRAVENOUS | Status: AC
Start: 2014-05-29 — End: 2014-05-29
  Filled 2014-05-29: qty 25

## 2014-05-29 MED ORDER — PANTOPRAZOLE SODIUM 40 MG PO TBEC
40.0000 mg | DELAYED_RELEASE_TABLET | Freq: Every day | ORAL | Status: DC
  Administered 2014-05-29 – 2014-05-31 (×3): 40 mg via ORAL
  Filled 2014-05-29 (×3): qty 1

## 2014-05-29 MED ORDER — EZETIMIBE 10 MG PO TABS
10.0000 mg | ORAL_TABLET | Freq: Every day | ORAL | Status: DC
  Administered 2014-05-29 – 2014-05-31 (×3): 10 mg via ORAL
  Filled 2014-05-29 (×3): qty 1

## 2014-05-29 MED ORDER — DEXTROSE-NACL 5-0.45 % IV SOLN
INTRAVENOUS | Status: DC
  Administered 2014-05-29 – 2014-05-30 (×2): via INTRAVENOUS

## 2014-05-29 MED ORDER — DIPHENHYDRAMINE HCL 12.5 MG/5ML PO ELIX: 12.5000 mg | ORAL_SOLUTION | Freq: Four times a day (QID) | ORAL | Status: DC | PRN

## 2014-05-29 MED ORDER — NALOXONE HCL 0.4 MG/ML IJ SOLN: 0.4000 mg | INTRAMUSCULAR | Status: DC | PRN

## 2014-05-29 MED ORDER — MIDAZOLAM HCL 2 MG/2ML IJ SOLN
1.0000 mg | INTRAMUSCULAR | Status: AC | PRN
  Administered 2014-05-29 (×3): 2 mg via INTRAVENOUS
  Filled 2014-05-29: qty 2

## 2014-05-29 MED ORDER — OXYCODONE-ACETAMINOPHEN 5-325 MG PO TABS
1.0000 | ORAL_TABLET | ORAL | Status: DC | PRN
  Administered 2014-05-30 – 2014-05-31 (×8): 1 via ORAL
  Filled 2014-05-29 (×9): qty 1

## 2014-05-29 MED ORDER — ONDANSETRON HCL 4 MG/2ML IJ SOLN
INTRAMUSCULAR | Status: DC | PRN
  Administered 2014-05-29: 4 mg via INTRAVENOUS

## 2014-05-29 MED ORDER — FENTANYL CITRATE 0.05 MG/ML IJ SOLN
INTRAMUSCULAR | Status: DC | PRN
  Administered 2014-05-29: 50 ug via INTRAVENOUS
  Administered 2014-05-29: 150 ug via INTRAVENOUS
  Administered 2014-05-29: 50 ug via INTRAVENOUS

## 2014-05-29 MED ORDER — DEXAMETHASONE SODIUM PHOSPHATE 4 MG/ML IJ SOLN
4.0000 mg | Freq: Once | INTRAMUSCULAR | Status: AC
  Administered 2014-05-29: 4 mg via INTRAVENOUS

## 2014-05-29 MED ORDER — ARIPIPRAZOLE 10 MG PO TABS
5.0000 mg | ORAL_TABLET | Freq: Every day | ORAL | Status: DC
  Administered 2014-05-30 – 2014-05-31 (×2): 5 mg via ORAL
  Filled 2014-05-29 (×2): qty 1

## 2014-05-29 MED ORDER — ENOXAPARIN SODIUM 40 MG/0.4ML ~~LOC~~ SOLN
40.0000 mg | SUBCUTANEOUS | Status: DC
  Administered 2014-05-30 – 2014-05-31 (×2): 40 mg via SUBCUTANEOUS
  Filled 2014-05-29 (×2): qty 0.4

## 2014-05-29 MED ORDER — ZOLPIDEM TARTRATE 5 MG PO TABS
5.0000 mg | ORAL_TABLET | Freq: Every day | ORAL | Status: DC
  Filled 2014-05-29: qty 1

## 2014-05-29 MED ORDER — SERTRALINE HCL 50 MG PO TABS
200.0000 mg | ORAL_TABLET | Freq: Every day | ORAL | Status: DC
  Administered 2014-05-30 – 2014-05-31 (×2): 200 mg via ORAL
  Filled 2014-05-29 (×2): qty 4

## 2014-05-29 MED ORDER — SUCCINYLCHOLINE CHLORIDE 20 MG/ML IJ SOLN
INTRAMUSCULAR | Status: AC
  Filled 2014-05-29: qty 1

## 2014-05-29 MED ORDER — CEFAZOLIN SODIUM-DEXTROSE 2-3 GM-% IV SOLR
2.0000 g | Freq: Once | INTRAVENOUS | Status: DC
  Filled 2014-05-29 (×2): qty 50

## 2014-05-29 SURGICAL SUPPLY — 47 items
BAG HAMPER (MISCELLANEOUS) ×3 IMPLANT
BANDAGE ELASTIC 4 VELCRO NS (GAUZE/BANDAGES/DRESSINGS) ×3 IMPLANT
BLADE SURG 15 STRL LF DISP TIS (BLADE) ×1 IMPLANT
BLADE SURG 15 STRL SS (BLADE) ×2
BLADE SURG SZ10 CARB STEEL (BLADE) ×3 IMPLANT
BNDG COHESIVE 4X5 TAN STRL (GAUZE/BANDAGES/DRESSINGS) ×6 IMPLANT
CHIPS CORTICOCANC 10CC CC10 (Bone Implant) ×3 IMPLANT
COVER LIGHT HANDLE STERIS (MISCELLANEOUS) ×6 IMPLANT
COVER MAYO STAND XLG (DRAPE) ×3 IMPLANT
CUFF TOURNIQUET SINGLE 44IN (TOURNIQUET CUFF) ×3 IMPLANT
DRAPE C-ARM FOLDED MOBILE STRL (DRAPES) ×3 IMPLANT
DURAPREP 26ML APPLICATOR (WOUND CARE) ×3 IMPLANT
ELECT REM PT RETURN 9FT ADLT (ELECTROSURGICAL) ×3
ELECTRODE REM PT RTRN 9FT ADLT (ELECTROSURGICAL) ×1 IMPLANT
GAUZE SPONGE 4X4 12PLY STRL (GAUZE/BANDAGES/DRESSINGS) ×3 IMPLANT
GAUZE XEROFORM 5X9 LF (GAUZE/BANDAGES/DRESSINGS) ×3 IMPLANT
GLOVE BIO SURGEON STRL SZ8 (GLOVE) ×3 IMPLANT
GLOVE BIO SURGEON STRL SZ8.5 (GLOVE) ×3 IMPLANT
GLOVE BIOGEL PI IND STRL 7.0 (GLOVE) ×2 IMPLANT
GLOVE BIOGEL PI IND STRL 7.5 (GLOVE) ×1 IMPLANT
GLOVE BIOGEL PI INDICATOR 7.0 (GLOVE) ×4
GLOVE BIOGEL PI INDICATOR 7.5 (GLOVE) ×2
GLOVE ECLIPSE 6.5 STRL STRAW (GLOVE) ×3 IMPLANT
GLOVE ECLIPSE 7.0 STRL STRAW (GLOVE) ×3 IMPLANT
GLOVE EXAM NITRILE LRG STRL (GLOVE) ×3 IMPLANT
GOWN STRL REUS W/TWL LRG LVL3 (GOWN DISPOSABLE) ×9 IMPLANT
GOWN STRL REUS W/TWL XL LVL3 (GOWN DISPOSABLE) ×3 IMPLANT
INST SET MINOR BONE (KITS) ×3 IMPLANT
K-WIRE 1.6 (WIRE) ×2
K-WIRE FX5X1.6XNS BN SS (WIRE) ×1
KIT ROOM TURNOVER APOR (KITS) ×3 IMPLANT
KWIRE FX5X1.6XNS BN SS (WIRE) ×1 IMPLANT
MANIFOLD NEPTUNE II (INSTRUMENTS) ×3 IMPLANT
NS IRRIG 1000ML POUR BTL (IV SOLUTION) ×3 IMPLANT
PACK BASIC LIMB (CUSTOM PROCEDURE TRAY) ×3 IMPLANT
PAD ABD 5X9 TENDERSORB (GAUZE/BANDAGES/DRESSINGS) ×9 IMPLANT
PAD ARMBOARD 7.5X6 YLW CONV (MISCELLANEOUS) ×3 IMPLANT
PAD CAST 4YDX4 CTTN HI CHSV (CAST SUPPLIES) ×1 IMPLANT
PADDING CAST COTTON 4X4 STRL (CAST SUPPLIES) ×2
SET BASIN LINEN APH (SET/KITS/TRAYS/PACK) ×3 IMPLANT
SPLINT J 5 (CAST SUPPLIES) ×3 IMPLANT
SPONGE LAP 18X18 X RAY DECT (DISPOSABLE) ×3 IMPLANT
STAPLER VISISTAT 35W (STAPLE) ×3 IMPLANT
SUT CHROMIC 0 CT 1 (SUTURE) ×3 IMPLANT
SUT PLAIN 2 0 XLH (SUTURE) ×3 IMPLANT
SWAB COLLECTION DEVICE MRSA (MISCELLANEOUS) ×3 IMPLANT
TOWEL OR 17X26 4PK STRL BLUE (TOWEL DISPOSABLE) ×3 IMPLANT

## 2014-05-29 NOTE — Op Note (Signed)
[name], [name]              ACCOUNT NO.:  [id number]  MEDICAL RECORD NO.:  [id number]  LOCATION:  [facility]                          FACILITY:  [facility]  PHYSICIAN:  J. Sanjuana Kava, M.D. 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  POSTOPERATIVE DIAGNOSIS:  Status post trimalleolar fracture of the left ankle.  The medial malleolus fracture was stable and appears to be healed even though on x-ray can still see a fracture line.  PROCEDURE:  Removal of malleolar screw and Kirschner wire from the left ankle. Application of bone grafting material to the medial malleolus.  SURGEON:  J. Sanjuana Kava, MD  ASSISTANT:  Simonne Maffucci, RN  TOURNIQUET TIME:  40 minutes.  Posterior splint applied.  INDICATIONS:  The patient had a fracture of her left ankle trimalleolar in March of this year.  She had surgery on the day she was admitted. She did well, however, she has continued to have pain on the medial side.  On x-rays it appeared that the fracture site was still apparent which may have a nonunion.  Because of continued pain, continued difficulty, I recommended surgery though she required a bone grafting. No new injury.  Lateral side has healed nicely and the posterior malleolus has healed nicely with good reduction in anatomy.  Mortise has remained normal.  DESCRIPTION OF PROCEDURE:  The patient was seen in the holding area, and the left ankle was identified as the correct surgical site.  She was taken to the operating room, placed supine on the operating room table and general anesthesia was given.  Tourniquet placed and inflated in left upper thigh.  C-arm fluoroscopy was brought in.  We had a radiological time-out and we had her on Aprons, lead shields and badges.  The  patient was then prepped and draped in usual manner.  At time-out, the patient identified as Claudia [name] and her left ankle.  All instrumentation properly positioned and working.  The patient's leg was elevated, wrapped circumferentially with an Esmarch bandage, tourniquet was inflated to 300 mmHg, Esmarch bandage removed.  Incision made in the previous incision line.  With careful dissection, the malleolar screw and the Kirschner wire were identified. The Kirschner wire was removed.  The area of the fracture site was identified and it was currently well healed.  The whole medial side of the ankle was stable.  I pushed on it, pulled on it, dissected and it was nicely healed.  There were no signs of improper healing or nonunion that I could say.  I elected to use the bone graft chips that we had to go into the site of the removed malleolar screw and I inserted these, and then put some bone graft chips around the side of the medial malleolus. The screw head could have caused some pain and now it is removed. The fascial layer was reapproximated using 2-0 chromic suture.  The subcutaneous tissues were reapproximated using 2-0 plain and skin reapproximated using skin staples.  Appropriate x-rays  were taken.  The patient will be given a screw and a pin.  Bulky dressing applied.  Posterior splint applied.  The patient  tolerated the procedure well and went to recovery in good condition.          ______________________________ Lenna Sciara. Sanjuana Kava, M.D.     JWK/MEDQ  D:  05/29/2014  T:  05/29/2014  Job:  [id number]

## 2014-05-29 NOTE — Anesthesia Postprocedure Evaluation (Signed)
  Anesthesia Post-op Note  Patient: Claudia [name]  Procedure(s) Performed: Procedure(s): OPEN REDUCTION INTERNAL FIXATION (ORIF) ANKLE FRACTURE (Left) REMOVAL OF SCREW AND K-WIRE (Left)  Patient Location: PACU  Anesthesia Type:General  Level of Consciousness: awake, alert , oriented and patient cooperative  Airway and Oxygen Therapy: Patient Spontanous Breathing and Patient connected to face mask oxygen  Post-op Pain: none  Post-op Assessment: Post-op Vital signs reviewed, Patient's Cardiovascular Status Stable, Respiratory Function Stable, Patent Airway, No signs of Nausea or vomiting and Pain level controlled  Post-op Vital Signs: Reviewed and stable  Last Vitals:  Filed Vitals:   05/29/14 0715  BP: 121/71  Pulse:   Temp:   Resp: 17    Complications: No apparent anesthesia complications

## 2014-05-29 NOTE — Anesthesia Preprocedure Evaluation (Signed)
Anesthesia Evaluation  Patient identified by MRN, date of birth, ID band Patient awake    Reviewed: Allergy & Precautions, H&P , NPO status , Patient's Chart, lab work & pertinent test results  History of Anesthesia Complications (+) PONV and history of anesthetic complications (hx hypoxemia after surgery)  Airway Mallampati: II  TM Distance: >3 FB Neck ROM: Full    Dental  (+) Poor Dentition, Dental Advisory Given, Teeth Intact   Pulmonary COPDCurrent Smoker,  breath sounds clear to auscultation        Cardiovascular hypertension, Rhythm:Regular Rate:Normal     Neuro/Psych PSYCHIATRIC DISORDERS Anxiety Depression    GI/Hepatic hiatal hernia, GERD-  Controlled and Medicated,  Endo/Other    Renal/GU      Musculoskeletal   Abdominal   Peds  Hematology   Anesthesia Other Findings   Reproductive/Obstetrics                             Anesthesia Physical Anesthesia Plan  ASA: III and emergent  Anesthesia Plan: General   Post-op Pain Management:    Induction: Intravenous, Rapid sequence and Cricoid pressure planned  Airway Management Planned: Oral ETT  Additional Equipment:   Intra-op Plan:   Post-operative Plan: Extubation in OR  Informed Consent: I have reviewed the patients History and Physical, chart, labs and discussed the procedure including the risks, benefits and alternatives for the proposed anesthesia with the patient or authorized representative who has indicated his/her understanding and acceptance.     Plan Discussed with:   Anesthesia Plan Comments:         Anesthesia Quick Evaluation

## 2014-05-29 NOTE — Brief Op Note (Signed)
05/29/2014  8:54 AM  PATIENT:  Claudia [name]  58 y.o. female  PRE-OPERATIVE DIAGNOSIS:  non union left medial malleolus fracture  POST-OPERATIVE DIAGNOSIS:  Healed medial malleolus fracture left  PROCEDURE:  Procedure(s): OPEN REDUCTION INTERNAL FIXATION (ORIF) ANKLE FRACTURE (Left) REMOVAL OF SCREW AND K-WIRE (Left) Placement of bone graft material  SURGEON:  Surgeon(s) and Role:    * Sanjuana Kava, MD - Primary  PHYSICIAN ASSISTANT:   ASSISTANTS: Simonne Maffucci, RN   ANESTHESIA:   general  EBL:  Total I/O In: 1400 [I.V.:1400] Out: 10 [Blood:10]  BLOOD ADMINISTERED:none  DRAINS: none   LOCAL MEDICATIONS USED:  NONE  SPECIMEN:  No Specimen  DISPOSITION OF SPECIMEN:  N/A  COUNTS:  YES  TOURNIQUET:   Total Tourniquet Time Documented: Thigh (Left) - 40 minutes Total: Thigh (Left) - 40 minutes   DICTATION: .Other Dictation: Dictation Number [id number]  PLAN OF CARE: Admit to inpatient   PATIENT DISPOSITION:  PACU - hemodynamically stable.   Delay start of Pharmacological VTE agent (>24hrs) due to surgical blood loss or risk of bleeding: no

## 2014-05-29 NOTE — Progress Notes (Signed)
Pt has complaints related to her PCA Morphine, she states that "it makes her feel funny". There are PO pain meds ordered to be used instead of the PCA. Pt instructed not to hit PCA button if she does not want any Morphine and she verbalized understanding.

## 2014-05-29 NOTE — Transfer of Care (Signed)
Immediate Anesthesia Transfer of Care Note  Patient: HAZELY SEALEY  Procedure(s) Performed: Procedure(s): OPEN REDUCTION INTERNAL FIXATION (ORIF) ANKLE FRACTURE (Left) REMOVAL OF SCREW AND K-WIRE (Left)  Patient Location: PACU  Anesthesia Type:General  Level of Consciousness: awake, alert , oriented, patient cooperative and responds to stimulation  Airway & Oxygen Therapy: Patient Spontanous Breathing and Patient connected to face mask oxygen  Post-op Assessment: Report given to PACU RN, Post -op Vital signs reviewed and stable and Patient moving all extremities  Post vital signs: Reviewed and stable  Complications: No apparent anesthesia complications

## 2014-05-29 NOTE — Progress Notes (Signed)
The History and Physical is unchanged. I have examined the patient. The patient is medically able to have surgery on the left ankle . Claudia [name]

## 2014-05-29 NOTE — Anesthesia Procedure Notes (Addendum)
Performed by: Michele Rockers   Procedure Name: Intubation Date/Time: 05/29/2014 7:36 AM Performed by: Gershon Mussel, Crystelle Ferrufino Pre-anesthesia Checklist: Patient identified, Patient being monitored, Timeout performed, Emergency Drugs available and Suction available Patient Re-evaluated:Patient Re-evaluated prior to inductionOxygen Delivery Method: Circle System Utilized Preoxygenation: Pre-oxygenation with 100% oxygen Intubation Type: IV induction Ventilation: Mask ventilation without difficulty Laryngoscope Size: Miller and 2 Grade View: Grade II Tube type: Oral Tube size: 7.0 mm Number of attempts: 1 Airway Equipment and Method: stylet Placement Confirmation: ETT inserted through vocal cords under direct vision,  positive ETCO2 and breath sounds checked- equal and bilateral Secured at: 21 cm Tube secured with: Tape Dental Injury: Teeth and Oropharynx as per pre-operative assessment

## 2014-05-30 ENCOUNTER — Encounter (HOSPITAL_COMMUNITY): Payer: Self-pay | Admitting: Orthopaedic Surgery

## 2014-05-30 NOTE — Addendum Note (Signed)
Addendum  created 05/30/14 0740 by Mickel Baas, CRNA   Modules edited: Notes Section   Notes Section:  File: [id number]

## 2014-05-30 NOTE — Anesthesia Postprocedure Evaluation (Signed)
  Anesthesia Post-op Note  Patient: Claudia [name]  Procedure(s) Performed: Procedure(s): OPEN REDUCTION INTERNAL FIXATION (ORIF) ANKLE FRACTURE AND PLACEMENT OF BONE GRAFT MATERIAL (Left) REMOVAL OF SCREW AND K-WIRE (Left)  Patient Location: PACU  Anesthesia Type:General  Level of Consciousness: awake, alert , oriented and patient cooperative  Airway and Oxygen Therapy: Patient Spontanous Breathing and Patient connected to nasal cannula oxygen  Post-op Pain: moderate  Post-op Assessment: Post-op Vital signs reviewed, Patient's Cardiovascular Status Stable, Respiratory Function Stable, Patent Airway and Pain level controlled  Post-op Vital Signs: Reviewed and stable  Last Vitals:  Filed Vitals:   05/30/14 0559  BP: 111/67  Pulse: 79  Temp: 36.9 C  Resp: 20    Complications: No apparent anesthesia complications

## 2014-05-30 NOTE — Plan of Care (Signed)
Problem: Phase I Progression Outcomes Goal: Pain controlled with appropriate interventions Outcome: Completed/Met Date Met:  05/30/14 Goal: OOB as tolerated unless otherwise ordered Outcome: Completed/Met Date Met:  05/30/14 Goal: Incision/dressings dry and intact Outcome: Completed/Met Date Met:  05/30/14 Goal: Voiding-avoid urinary catheter unless indicated Outcome: Completed/Met Date Met:  05/30/14 Goal: Vital signs/hemodynamically stable Outcome: Completed/Met Date Met:  05/30/14

## 2014-05-30 NOTE — Evaluation (Signed)
Physical Therapy Evaluation Patient Details Name: Claudia [name] MRN: [id number] DOB: 12-18-1955 Today's Date: 05/30/2014   History of Present Illness  Pt fell and broke her Left ankle last March.  Pt presents with a  non union left medial malleolus fracture. Pt is s/p L ankle ORIF with removal of screw and wire and placement of bone graft.  Clinical Impression  Pt presents with dependencies in mobility secondary to L ankle ORIF. Pt has good home support and recommend d/c home with HHPT follow-up. Pt would benefit from acute skilled PT to reinforce TDWB with mobility and step training. Pt reported she might d/c home tomorrow am. Recommend a PT session prior to d/c to reinforce mobility and step training. See below for equipment recommendations.    Follow Up Recommendations Home health PT    Equipment Recommendations  Rolling walker with 5" wheels;Wheelchair (measurements PT) (w/c 20x18 with left elevating leg rests)    Recommendations for Other Services       Precautions / Restrictions Restrictions Weight Bearing Restrictions: Yes LLE Weight Bearing: Touchdown weight bearing      Mobility  Bed Mobility Overal bed mobility: Independent                Transfers Overall transfer level: Modified independent Equipment used: Rolling walker (2 wheeled)             General transfer comment: Pt demonstrated Mod. Independence once the therapist demonstrated correct technique.  Ambulation/Gait Ambulation/Gait assistance: Supervision Ambulation Distance (Feet): 20 Feet Assistive device: Rolling walker (2 wheeled) Gait Pattern/deviations: Step-to pattern   Gait velocity interpretation: Below normal speed for age/gender General Gait Details: Left TDWB, cues to maintain TDWB when fatigued  Stairs Stairs:  (discussed technique for curb and steps)          Wheelchair Mobility    Modified Rankin (Stroke Patients Only)       Balance Overall balance assessment: No  apparent balance deficits (not formally assessed)                                           Pertinent Vitals/Pain Pain Assessment: 0-10 Pain Score: 5  Pain Location: left ankle Pain Descriptors / Indicators: Aching Pain Intervention(s): Limited activity within patient's tolerance    Home Living Family/patient expects to be discharged to:: Private residence Living Arrangements: Non-relatives/Friends Available Help at Discharge: Family;Available 24 hours/day Type of Home: House Home Access: Stairs to enter Entrance Stairs-Rails: None Entrance Stairs-Number of Steps: 4 Home Layout: One level Home Equipment: Bedside commode      Prior Function Level of Independence: Independent               Hand Dominance        Extremity/Trunk Assessment   Upper Extremity Assessment: Defer to OT evaluation           Lower Extremity Assessment: LLE deficits/detail   LLE Deficits / Details: hip/knee 3/5 greater NT due to surgical pain  Cervical / Trunk Assessment: Normal  Communication   Communication: No difficulties  Cognition Arousal/Alertness: Awake/alert Behavior During Therapy: WFL for tasks assessed/performed Overall Cognitive Status: Within Functional Limits for tasks assessed                      General Comments      Exercises        Assessment/Plan  PT Assessment Patient needs continued PT services  PT Diagnosis Difficulty walking;Acute pain   PT Problem List Decreased activity tolerance;Decreased mobility;Decreased knowledge of use of DME;Pain  PT Treatment Interventions DME instruction;Gait training;Stair training;Therapeutic activities;Therapeutic exercise;Patient/family education   PT Goals (Current goals can be found in the Care Plan section) Acute Rehab PT Goals Patient Stated Goal: to walk PT Goal Formulation: With patient Time For Goal Achievement: 06/06/14 Potential to Achieve Goals: Good    Frequency Min  5X/week   Barriers to discharge        Co-evaluation               End of Session Equipment Utilized During Treatment: Gait belt Activity Tolerance: Patient limited by pain Patient left: in bed;with call bell/phone within reach;with family/visitor present Nurse Communication: Mobility status         Time: 0755-0825 PT Time Calculation (min): 30 min   Charges:   PT Evaluation $Initial PT Evaluation Tier I: 1 Procedure PT Treatments $Gait Training: 8-22 mins   PT G Codes:          [name] 05/30/2014, 9:28 AM

## 2014-05-30 NOTE — Care Management Note (Signed)
    Page 1 of 2   05/31/2014     2:13:34 PM CARE MANAGEMENT NOTE 05/31/2014  Patient:  Claudia [name], Claudia [name]   Account Number:  [id number]  Date Initiated:  05/30/2014  Documentation initiated by:  Vladimir Creeks  Subjective/Objective Assessment:   Admitted with fx ankle, following ORIF. pt is from home with a friend. She has family available to assist as needed. Daughter is in the room with pt.     Action/Plan:   Pt recommends a walker and W/C, but pt has a walker and a BSC from previous surgery.PT also recommends HH PT to see pt at home. Pt uses [facility].   Anticipated DC Date:  05/31/2014   Anticipated DC Plan:  [place]  CM consult      PAC Choice  [place]   Choice offered to / List presented to:  C-1 Patient   DME arranged  [place]      DME agency  [place] arranged  [place].   Status of service:  Completed, signed off Medicare Important Message given?   (If response is "NO", the following Medicare IM given date fields will be blank) Date Medicare IM given:   Medicare IM given by:   Date Additional Medicare IM given:   Additional Medicare IM given by:    Discharge Disposition:  [place]  Per UR Regulation:  Reviewed for med. necessity/level of care/duration of stay  If discussed at [place] of Stay Meetings, dates discussed:    Comments:  05/31/14 1200 Vladimir Creeks RN/CM 05/30/14 [place] RN/CM

## 2014-05-30 NOTE — Care Management Utilization Note (Signed)
UR completed 

## 2014-05-30 NOTE — Progress Notes (Signed)
Subjective: 1 Day Post-Op Procedure(s) (LRB): OPEN REDUCTION INTERNAL FIXATION (ORIF) ANKLE FRACTURE AND PLACEMENT OF BONE GRAFT MATERIAL (Left) REMOVAL OF SCREW AND K-WIRE (Left) Patient reports pain as 6 on 0-10 scale.    Objective: Vital signs in last 24 hours: Temp:  [97.8 F (36.6 C)-98.7 F (37.1 C)] 98.5 F (36.9 C) (11/04 0559) Pulse Rate:  [79-106] 79 (11/04 0559) Resp:  [11-23] 20 (11/04 0559) BP: (106-134)/(63-79) 111/67 mmHg (11/04 0559) SpO2:  [92 %-100 %] 97 % (11/04 0559) Weight:  [107.956 kg (238 lb)] 107.956 kg (238 lb) (11/03 1322)  Intake/Output from previous day: 11/03 0701 - 11/04 0700 In: 1980 [P.O.:480; I.V.:1500] Out: 710 [Urine:700; Blood:10] Intake/Output this shift:    No results for input(s): HGB in the last 72 hours. No results for input(s): WBC, RBC, HCT, PLT in the last 72 hours. No results for input(s): NA, K, CL, CO2, BUN, CREATININE, GLUCOSE, CALCIUM in the last 72 hours. No results for input(s): LABPT, INR in the last 72 hours.  Neurologically intact Neurovascular intact Sensation intact distally Intact pulses distally  Assessment/Plan: 1 Day Post-Op Procedure(s) (LRB): OPEN REDUCTION INTERNAL FIXATION (ORIF) ANKLE FRACTURE AND PLACEMENT OF BONE GRAFT MATERIAL (Left) REMOVAL OF SCREW AND K-WIRE (Left) Up with therapy  Amparo Donalson 05/30/2014, 7:29 AM

## 2014-05-31 LAB — WOUND CULTURE: Culture: NO GROWTH

## 2014-05-31 MED ORDER — ENOXAPARIN SODIUM 40 MG/0.4ML ~~LOC~~ SOLN: 40.0000 mg | SUBCUTANEOUS | Status: DC

## 2014-05-31 NOTE — Discharge Summary (Signed)
Physician Discharge Summary  Patient ID: Claudia [name] MRN: [id number] DOB/AGE: December 27, 1955 58 y.o.  Admit date: 05/29/2014 Discharge date: 05/31/2014  Admission Diagnoses: Delayed healing medial malleolus fracture of the left ankle  Discharge Diagnoses: Delayed healing medial malleolus fracture of the left ankle Active Problems:   Closed fracture of medial malleolus of left ankle with delayed healing   Discharged Condition: good  Hospital Course: She was admitted to the hospital after surgery on the left ankle.  She has remained afebrile.  Her pain has been controlled.  She has been seen by physical therapy and will continue to use the walker with touch down weight bearing only.  She is to elevate ankle at home, keep it dry.  Her labs have been normal.    Consults: None  Significant Diagnostic Studies: labs: normal and radiology: X-Ray: show removal of the medial malleolus screw and k-wire.  Treatments: surgery: removal of medial malleolus screw and K-wire from left ankle medially and placement of bone graft chips.  Discharge Exam: Blood pressure 112/70, pulse 72, temperature 98 F (36.7 C), temperature source Oral, resp. rate 19, height 5\' 4"  (1.626 m), weight 107.956 kg (238 lb), SpO2 95 %. General appearance: alert and cooperative.  Pain is controlled.  Neurovascular intact.  Disposition: 06-Home-Health Care Svc     Medication List    TAKE these medications        alprazolam 2 MG tablet  Commonly known as:  XANAX  Take 2 mg by mouth 4 (four) times daily.     ARIPiprazole 5 MG tablet  Commonly known as:  ABILIFY  Take 5 mg by mouth daily.     aspirin EC 81 MG tablet  Take 81 mg by mouth daily.     enoxaparin 40 MG/0.4ML injection  Commonly known as:  LOVENOX  Inject 0.4 mLs (40 mg total) into the skin daily.     ezetimibe 10 MG tablet  Commonly known as:  ZETIA  Take 1 tablet (10 mg total) by mouth daily.     fish oil-omega-3 fatty acids 1000 MG capsule   Take 2 capsules (2 g total) by mouth daily.     hydrocortisone 2.5 % cream  Apply 1 application topically See admin instructions. 2-3 times daily as needed for eczema on face     omeprazole 20 MG capsule  Commonly known as:  PRILOSEC  Take 20 mg by mouth daily as needed. For acid reflux     oxyCODONE-acetaminophen 7.5-325 MG per tablet  Commonly known as:  PERCOCET  Take 1 tablet by mouth every 4 (four) hours as needed for pain.     sertraline 100 MG tablet  Commonly known as:  ZOLOFT  Take 200 mg by mouth daily.     zolpidem 10 MG tablet  Commonly known as:  AMBIEN  Take 1 tablet by mouth at bedtime.           Follow-up Information    Follow up with Sanjuana Kava, MD In 2 weeks.   Specialty:  Orthopedic Surgery   Contact information:   [place] [id number] [phone]       Signed: Sanjuana Kava 05/31/2014, 9:04 AM

## 2014-05-31 NOTE — Progress Notes (Signed)
Wasted  16.40mls  Of Morphine Sulphate 1mg /ml, in the sink in the medication room from a PCA pump.

## 2014-05-31 NOTE — Progress Notes (Signed)
Discharge teaching done with friend and Pt.  Pt and friend taught how to administer a lovenox injection.  Friend stated she had done it before for a friend and Pt stated she was comfortably with friend performing injection for her.  Sharps container provided.  Pt belongings collected and Pt taken via wheelchair to exit to meet friends car.  Pt has walker to take home and homehealth to deliver wheelchair.  Pt stable at time of discharge.  Pt refused pain medications before discharge, stated pain 5/10 on the pain scale.

## 2014-05-31 NOTE — Discharge Instructions (Signed)
Use Walker.  Elevate left foot and ankle.  Keep dressing dry.  Take pain medicine as directed.  If any problem, contact Dr. Brooke Bonito office at [phone].  If after hours, contact the hospital at [phone].  Keep appointment to Dr. Luna Glasgow in two weeks.

## 2014-05-31 NOTE — Plan of Care (Signed)
Problem: Phase II Progression Outcomes Goal: Vital signs stable Outcome: Completed/Met Date Met:  05/31/14     

## 2014-05-31 NOTE — Plan of Care (Signed)
Witnessed 16.5 mls Morphine Sulfate PCA pump waste.  Unable to waste in pyxis since date too far back.  Pharm asked [place] to place note in chart.

## 2014-05-31 NOTE — Plan of Care (Signed)
Problem: Phase II Progression Outcomes Goal: Pain controlled Outcome: Completed/Met Date Met:  05/31/14     

## 2014-05-31 NOTE — Progress Notes (Signed)
Physical Therapy Treatment Patient Details Name: Claudia [name] MRN: [id number] DOB: 07-Oct-1955 Today's Date: 05/31/2014    History of Present Illness Pt fell and broke her Left ankle last March.  Pt presents with a  non union left medial malleolus fracture. Pt is s/p L ankle ORIF with removal of screw and wire and placement of bone graft.    PT Comments    Pt agreeable to treatment today, though reports some nausea with movement today.   Mild complaints of pain, though pt reports some anxiety with going home and navigation of curbs/stairs.  Educated and demonstrated to pt techniques for navigation of both, though pt declined attempts with stairs today secondary to nausea.  Pt did agree to attempt gait in the room.  Pt required sitting rest break after transfer to EOB secondary to light headedness, and 1 standing rest break during gait secondary to fatigue/light headedness.  Educated pt on resting and deep breathing to decrease light headed feelings, and for safety with functional mobility.  Pt reports she will have assist x2 as needed at d/c.  Recommend continued PT to address functional mobility with transition to HHPT.    Follow Up Recommendations  Home health PT     Equipment Recommendations  Rolling walker with 5" wheels;Wheelchair (measurements PT)        Precautions / Restrictions Precautions Precautions: Fall Restrictions Weight Bearing Restrictions: Yes LLE Weight Bearing: Touchdown weight bearing    Mobility  Bed Mobility Overal bed mobility: Independent                Transfers Overall transfer level: Modified independent Equipment used: Rolling walker (2 wheeled) Transfers: Sit to/from Stand Sit to Stand: Modified independent (Device/Increase time);Supervision         General transfer comment: Supervision with tranfer today secondary to lightheaded feeling after transferring to sit.   Ambulation/Gait Ambulation/Gait assistance: Supervision Ambulation  Distance (Feet): 20 Feet Assistive device: Rolling walker (2 wheeled) Gait Pattern/deviations: Step-to pattern   Gait velocity interpretation: Below normal speed for age/gender General Gait Details: Left TDWB, cues to maintain TDWB when fatigued.  VC during gait for standing rest break when fatigued to maintain TDWB.         Balance Overall balance assessment: Needs assistance Sitting-balance support: Feet supported;Single extremity supported Sitting balance-Leahy Scale: Good     Standing balance support: Bilateral upper extremity supported;During functional activity Standing balance-Leahy Scale: Fair                      Cognition Arousal/Alertness: Awake/alert Behavior During Therapy: WFL for tasks assessed/performed Overall Cognitive Status: Within Functional Limits for tasks assessed                              Pertinent Vitals/Pain Pain Assessment: 0-10 Pain Score: 4  Pain Location: Lt ankle Pain Descriptors / Indicators: Aching;Throbbing Pain Intervention(s): Limited activity within patient's tolerance;Repositioned             PT Goals (current goals can now be found in the care plan section)      Frequency  Min 5X/week           End of Session Equipment Utilized During Treatment: Gait belt Activity Tolerance: Patient limited by pain Patient left: in bed;with call bell/phone within reach;with family/visitor present     Time: 1030-1055 PT Time Calculation (min): 25 min  Charges:  $Gait Training: 8-22 mins $Self Care/Home Management:  8-22 (Long discussion with demonstration for curb navigation, stair navigation, and navigation over grass)                      Frazer [place] 05/31/2014, 11:14 AM

## 2014-05-31 NOTE — Progress Notes (Signed)
Subjective: 2 Days Post-Op Procedure(s) (LRB): OPEN REDUCTION INTERNAL FIXATION (ORIF) ANKLE FRACTURE AND PLACEMENT OF BONE GRAFT MATERIAL (Left) REMOVAL OF SCREW AND K-WIRE (Left) Patient reports pain as 3 on 0-10 scale.    Objective: Vital signs in last 24 hours: Temp:  [97.7 F (36.5 C)-98.3 F (36.8 C)] 98 F (36.7 C) (11/05 0726) Pulse Rate:  [69-81] 72 (11/05 0726) Resp:  [12-20] 19 (11/05 0522) BP: (110-114)/(59-70) 112/70 mmHg (11/05 0726) SpO2:  [94 %-97 %] 95 % (11/05 0726) FiO2 (%):  [1 %] 1 % (11/05 0400)  Intake/Output from previous day: 11/04 0701 - 11/05 0700 In: 705 [P.O.:240; I.V.:465] Out: 450 [Urine:450] Intake/Output this shift: Total I/O In: -  Out: 250 [Urine:250]  No results for input(s): HGB in the last 72 hours. No results for input(s): WBC, RBC, HCT, PLT in the last 72 hours. No results for input(s): NA, K, CL, CO2, BUN, CREATININE, GLUCOSE, CALCIUM in the last 72 hours. No results for input(s): LABPT, INR in the last 72 hours.  Neurologically intact Neurovascular intact Sensation intact distally Intact pulses distally  She has done well.  To discharge home to home health.  Assessment/Plan: 2 Days Post-Op Procedure(s) (LRB): OPEN REDUCTION INTERNAL FIXATION (ORIF) ANKLE FRACTURE AND PLACEMENT OF BONE GRAFT MATERIAL (Left) REMOVAL OF SCREW AND K-WIRE (Left) Discharge home with home health  Claudia [name] 05/31/2014, 8:59 AM

## 2014-10-18 ENCOUNTER — Other Ambulatory Visit (HOSPITAL_COMMUNITY): Payer: Self-pay | Admitting: "Endocrinology

## 2014-10-18 DIAGNOSIS — E042 Nontoxic multinodular goiter: Secondary | ICD-10-CM

## 2014-10-24 ENCOUNTER — Ambulatory Visit (HOSPITAL_COMMUNITY): Admission: RE | Admit: 2014-10-24 | Payer: BLUE CROSS/BLUE SHIELD | Source: Ambulatory Visit

## 2014-10-29 ENCOUNTER — Ambulatory Visit (HOSPITAL_COMMUNITY): Payer: BLUE CROSS/BLUE SHIELD

## 2014-10-30 ENCOUNTER — Ambulatory Visit (HOSPITAL_COMMUNITY): Admission: RE | Admit: 2014-10-30 | Payer: BLUE CROSS/BLUE SHIELD | Source: Ambulatory Visit

## 2014-11-01 ENCOUNTER — Ambulatory Visit (HOSPITAL_COMMUNITY)
Admission: RE | Admit: 2014-11-01 | Discharge: 2014-11-01 | Disposition: A | Discharge status: Home / Self Care | Payer: BLUE CROSS/BLUE SHIELD | Source: Ambulatory Visit | Attending: "Endocrinology | Admitting: "Endocrinology

## 2014-11-01 DIAGNOSIS — E042 Nontoxic multinodular goiter: Secondary | ICD-10-CM

## 2014-11-02 ENCOUNTER — Ambulatory Visit (HOSPITAL_COMMUNITY): Payer: BLUE CROSS/BLUE SHIELD

## 2014-11-03 IMAGING — US US CAROTID DUPLEX BILAT
1 series · 13 of 24 positions shown · non-contrast
Comparison: None.

CLINICAL DATA: Ataxia.

BILATERAL CAROTID DUPLEX ULTRASOUND
TECHNIQUE: Gray scale imaging, color Doppler and duplex ultrasound
was performed of bilateral carotid and vertebral arteries in the
neck.

[Series 1: us carotid duplex bilat · 0.05mm/px · 13 of 108 slices shown]
[im 1/108]
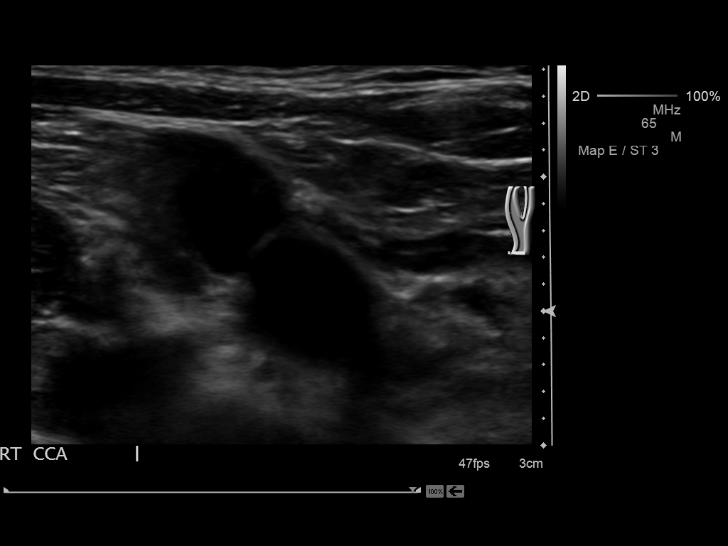
[im 10/108]
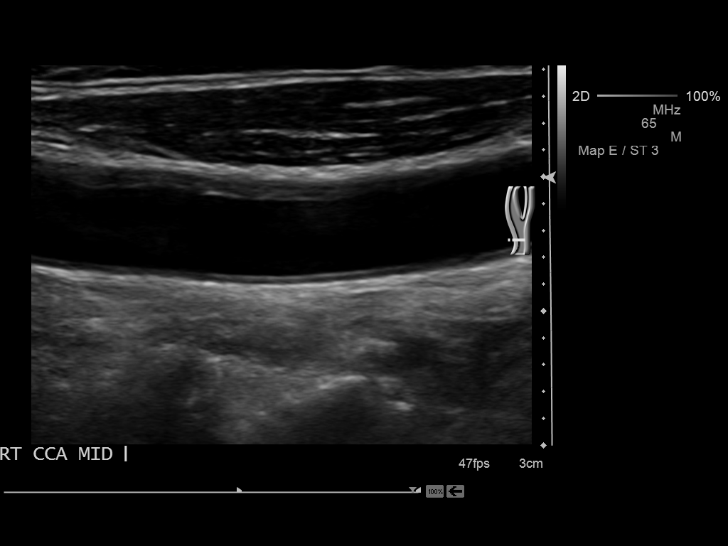
[im 19/108]
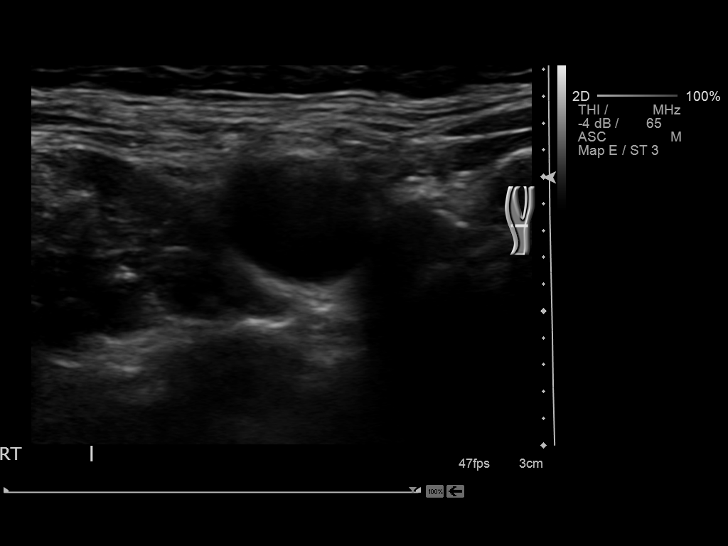
[im 28/108]
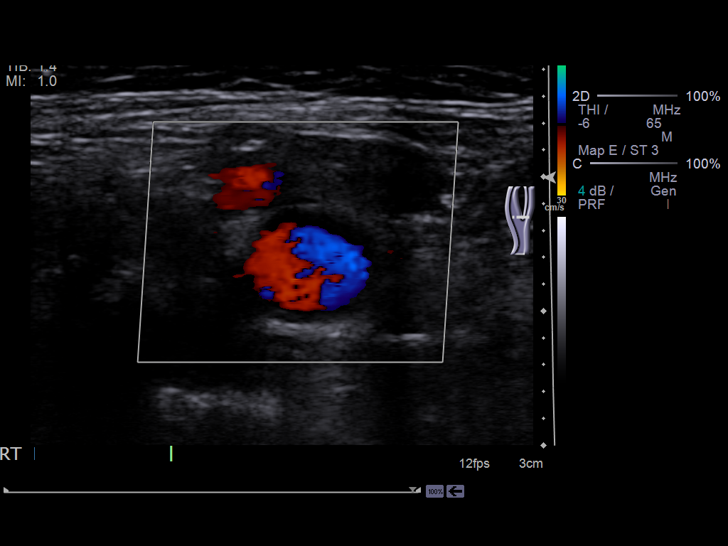
[im 38/108]
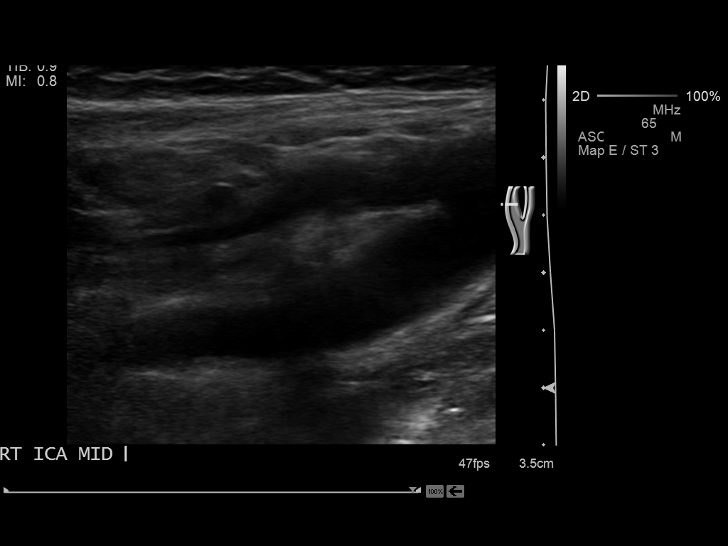
[im 47/108]
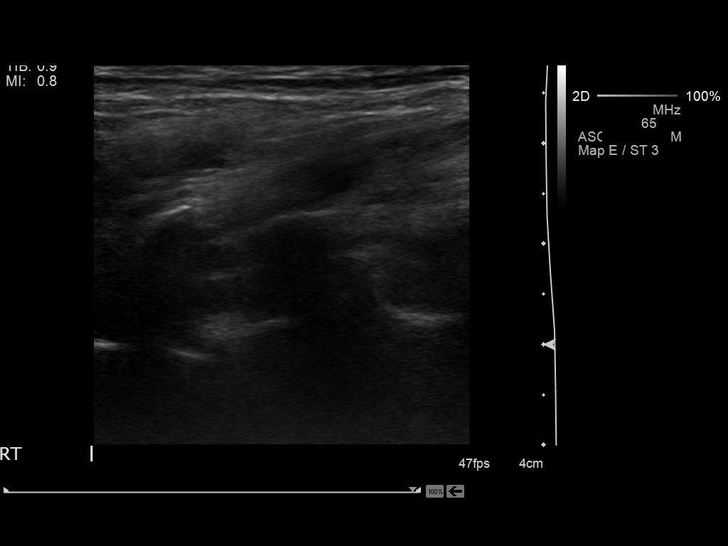
[im 56/108]
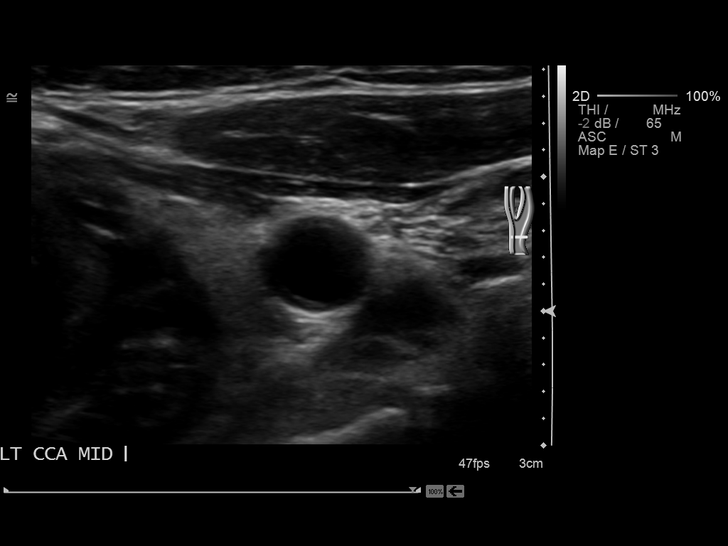
[im 61/108]
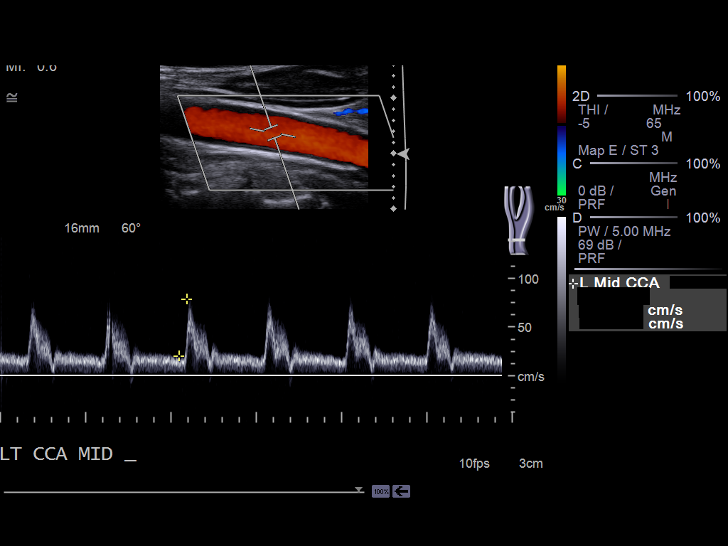
[im 70/108]
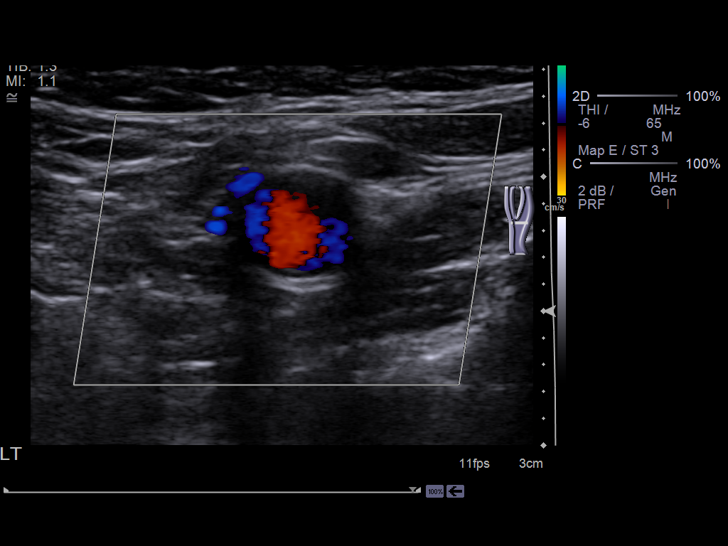
[im 80/108]
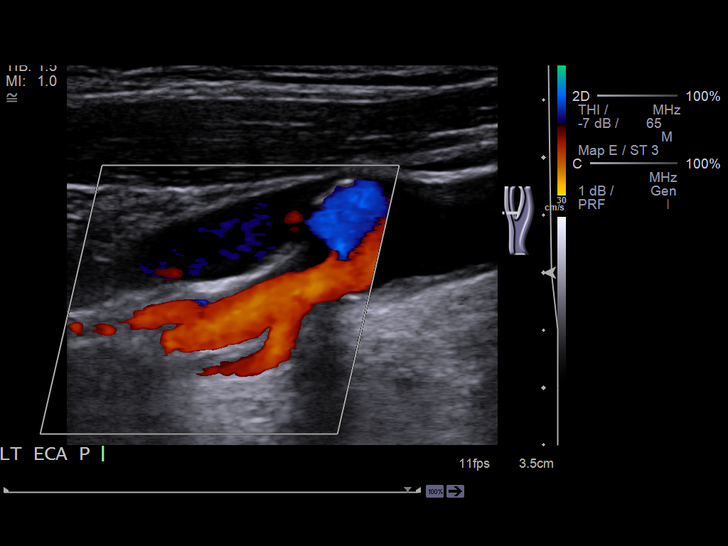
[im 89/108]
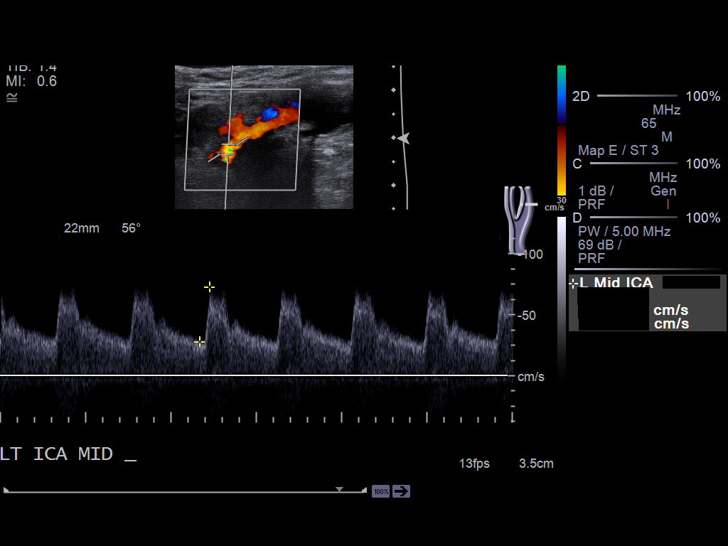
[im 98/108]
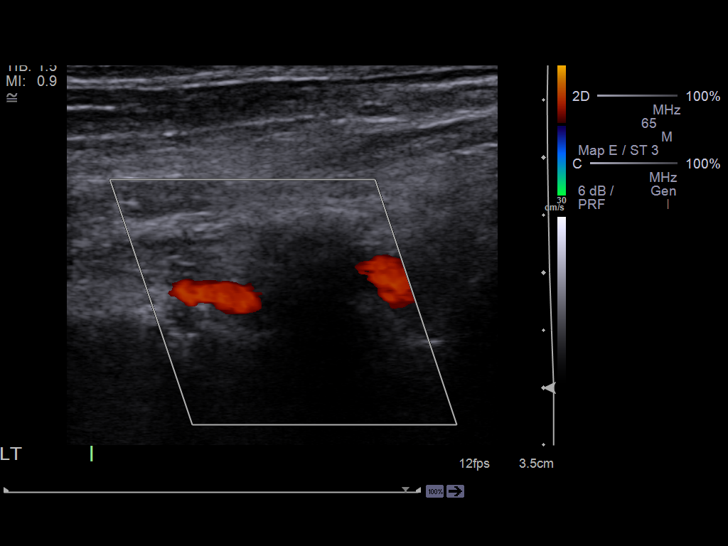
[im 108/108]
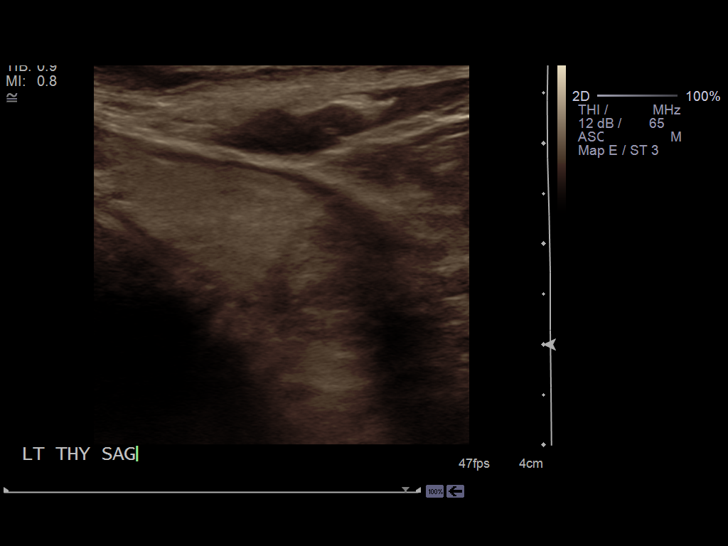

[13 of 24 positions shown; findings below may reference images not displayed]

Criteria:  Quantification of carotid stenosis is based on velocity
parameters that correlate the residual internal carotid diameter
with NASCET-based stenosis levels, using the diameter of the distal
internal carotid lumen as the denominator for stenosis measurement.

The following velocity measurements were obtained:

                 PEAK SYSTOLIC/END DIASTOLIC
RIGHT
ICA:                        71cm/sec
CCA:                        72cm/sec
SYSTOLIC ICA/CCA RATIO:
DIASTOLIC ICA/CCA RATIO:
ECA:                        80cm/sec

LEFT
ICA:                        73cm/sec
CCA:                        79cm/sec
SYSTOLIC ICA/CCA RATIO:
DIASTOLIC ICA/CCA RATIO:
ECA:                        67cm/sec
FINDINGS: RIGHT CAROTID ARTERY: Intimal thickening in the right common
carotid artery.  Normal waveforms and velocities in the right
internal carotid artery.

RIGHT VERTEBRAL ARTERY:  Antegrade flow and normal waveform in the
right vertebral artery.]

LEFT CAROTID ARTERY: Mild plaque and wall thickening at the left
carotid bulb.  Small amount of plaque in the proximal internal
carotid artery.  Normal waveforms and velocities within the left
internal carotid artery.

LEFT VERTEBRAL ARTERY:  Antegrade flow and normal waveform in the
left vertebral artery.]

Other findings:  There is a heterogeneous nodule in the right
thyroid lobe that measures 1.5 x 1.1 x 1.1 cm.  There is a solid
nodule in the left thyroid lobe that measures 1.3 x 1.6 x 1.3 cm.
IMPRESSION: Mild atherosclerotic disease in the carotid arteries, left side
greater than right.  Estimated degree of stenosis in the internal
carotid arteries is less than 50% bilaterally.

Patent vertebral arteries.

Bilateral thyroid nodules.  The left solid nodule meets the
criteria for ultrasound guided biopsy.  Consider further evaluation
of the thyroid tissue with a dedicated thyroid ultrasound.

## 2014-12-05 ENCOUNTER — Other Ambulatory Visit (HOSPITAL_COMMUNITY): Payer: Self-pay | Admitting: Orthopaedic Surgery

## 2014-12-05 DIAGNOSIS — M25572 Pain in left ankle and joints of left foot: Secondary | ICD-10-CM

## 2014-12-07 ENCOUNTER — Ambulatory Visit (HOSPITAL_COMMUNITY)
Admission: RE | Admit: 2014-12-07 | Discharge: 2014-12-07 | Disposition: A | Discharge status: Home / Self Care | Payer: BLUE CROSS/BLUE SHIELD | Source: Ambulatory Visit | Attending: Orthopaedic Surgery | Admitting: Orthopaedic Surgery

## 2014-12-07 DIAGNOSIS — M25572 Pain in left ankle and joints of left foot: Secondary | ICD-10-CM | POA: Diagnosis present

## 2015-07-28 HISTORY — PX: JOINT REPLACEMENT: SHX530

## 2015-09-10 ENCOUNTER — Telehealth: Payer: Self-pay | Admitting: Orthopaedic Surgery

## 2015-09-10 MED ORDER — OXYCODONE-ACETAMINOPHEN 7.5-325 MG PO TABS: 1.0000 | ORAL_TABLET | ORAL | Status: DC | PRN

## 2015-09-10 NOTE — Telephone Encounter (Signed)
Prescription available, unable to reach patient, no vm set up

## 2015-09-10 NOTE — Telephone Encounter (Signed)
Rx printed

## 2015-09-10 NOTE — Telephone Encounter (Signed)
Percocet 7.5/325mg  Qty 120 Tablets        pt states her surgery has been postponed until March

## 2015-09-26 ENCOUNTER — Other Ambulatory Visit (HOSPITAL_COMMUNITY): Payer: Self-pay | Admitting: Internal Medicine

## 2015-09-26 DIAGNOSIS — Z720 Tobacco use: Secondary | ICD-10-CM

## 2015-10-02 ENCOUNTER — Ambulatory Visit (HOSPITAL_COMMUNITY): Admission: RE | Admit: 2015-10-02 | Payer: BLUE CROSS/BLUE SHIELD | Source: Ambulatory Visit

## 2015-10-08 ENCOUNTER — Telehealth: Payer: Self-pay | Admitting: Orthopaedic Surgery

## 2015-10-14 NOTE — Telephone Encounter (Signed)
Patient was told she needed to be seen

## 2015-10-15 ENCOUNTER — Ambulatory Visit (INDEPENDENT_AMBULATORY_CARE_PROVIDER_SITE_OTHER): Payer: BLUE CROSS/BLUE SHIELD | Admitting: Orthopaedic Surgery

## 2015-10-15 ENCOUNTER — Encounter: Payer: Self-pay | Admitting: Orthopaedic Surgery

## 2015-10-15 VITALS — BP 131/81 | HR 95 | Temp 97.9°F | Resp 16 | Ht 64.0 in | Wt 234.0 lb

## 2015-10-15 DIAGNOSIS — M25572 Pain in left ankle and joints of left foot: Secondary | ICD-10-CM

## 2015-10-15 MED ORDER — OXYCODONE-ACETAMINOPHEN 7.5-325 MG PO TABS: 1.0000 | ORAL_TABLET | ORAL | Status: DC | PRN

## 2015-10-15 NOTE — Progress Notes (Signed)
Patient Claudia [name], female DOB:07/24/1956, 60 y.o. [id number]  Chief Complaint  Patient presents with  . Follow-up    follow up left ankle pain    HPI  Claudia [name] is a 60 y.o. female who has chronic left ankle pain.  She has had several surgeries on the ankle and has been scheduled to have an ankle arthrodesis at [facility].  The surgery was delayed because her surgeon got sick himself and she had to see another physician. There have been multiple delays on getting her surgery done on the ankle.  She had stopped smoking but resumed it this week (see below for reasons).  She is now scheduled to have surgery at [facility] on the left ankle on May 5th.  HPI  Body mass index is 40.15 kg/(m^2).   Review of Systems  Constitutional:       Patient does not have Diabetes Mellitus. Patient has hypertension. Patient has COPD or shortness of breath. Patient has BMI > 35. Patient has current smoking history  HENT: Negative for congestion.   Respiratory: Positive for cough and shortness of breath.   Endocrine: Positive for cold intolerance.  Musculoskeletal: Positive for myalgias, joint swelling, arthralgias and gait problem.  Allergic/Immunologic: Positive for environmental allergies.    Past Medical History  Diagnosis Date  . Anxiety   . Depression   . Hypertension   . Breast cancer ([place])   . GERD (gastroesophageal reflux disease)   . Cancer of right breast ([place])     mastectomy  . Irritable bowel   . Hiatal hernia   . Hypercholesterolemia   . PONV (postoperative nausea and vomiting)     Past Surgical History  Procedure Laterality Date  . Mastectomy    . Tubal ligation    . Abdominal hysterectomy    . Total abdominal hysterectomy w/ bilateral salpingoophorectomy    . Tonsillectomy    . Cholecystectomy    . Foot surgery      bilateral spur removal  . Orif ankle fracture Left 10/10/2013    Procedure: OPEN REDUCTION INTERNAL FIXATION (ORIF) ANKLE FRACTURE;  Surgeon: Sanjuana Kava, MD;  Location: [facility];  Service: Orthopedics;  Laterality: Left;  . Orif ankle fracture Left 05/29/2014    Procedure: OPEN REDUCTION INTERNAL FIXATION (ORIF) ANKLE FRACTURE AND PLACEMENT OF BONE GRAFT MATERIAL;  Surgeon: Sanjuana Kava, MD;  Location: [facility];  Service: Orthopedics;  Laterality: Left;  . Hardware removal Left 05/29/2014    Procedure: REMOVAL OF SCREW AND K-WIRE;  Surgeon: Sanjuana Kava, MD;  Location: [facility];  Service: Orthopedics;  Laterality: Left;    Family History  Problem Relation Age of Onset  . Liver disease Mother   . Hypertension Father   . Heart attack Sister     Social History Social History  Substance Use Topics  . Smoking status: Current Some Day Smoker -- 1.00 packs/day for 40 years    Types: Cigarettes  . Smokeless tobacco: None  . Alcohol Use: No    Allergies  Allergen Reactions  . Statins Other (See Comments)    Muscle pain    Current Outpatient Prescriptions  Medication Sig Dispense Refill  . alprazolam (XANAX) 2 MG tablet Take 2 mg by mouth 4 (four) times daily.    Marland Kitchen amLODipine-benazepril (LOTREL) 10-20 MG capsule Take 1 capsule by mouth daily.    . ARIPiprazole (ABILIFY) 5 MG tablet Take 5 mg by mouth daily.    Marland Kitchen aspirin EC 81 MG tablet Take 81 mg  by mouth daily.     Marland Kitchen ezetimibe (ZETIA) 10 MG tablet Take 1 tablet (10 mg total) by mouth daily. 30 tablet 0  . fish oil-omega-3 fatty acids 1000 MG capsule Take 2 capsules (2 g total) by mouth daily. 60 capsule 0  . hydrocortisone 2.5 % cream Apply 1 application topically See admin instructions. 2-3 times daily as needed for eczema on face 30 g 0  . omeprazole (PRILOSEC) 20 MG capsule Take 20 mg by mouth daily as needed. For acid reflux    . oxyCODONE-acetaminophen (PERCOCET) 7.5-325 MG tablet Take 1 tablet by mouth every 4 (four) hours as needed for moderate pain or severe pain (Must last 30 days.Do not take and drive a car or operate machinery). 120 tablet 0  . sertraline (ZOLOFT) 100 MG  tablet Take 200 mg by mouth daily.     Marland Kitchen zolpidem (AMBIEN) 10 MG tablet Take 1 tablet by mouth at bedtime.     No current facility-administered medications for this visit.     Physical Exam  Blood pressure 131/81, pulse 95, temperature 97.9 F (36.6 C), resp. rate 16, height 5\' 4"  (1.626 m), weight 234 lb (106.142 kg).  Constitutional: overall normal hygiene, normal nutrition, well developed, normal grooming, normal body habitus. Assistive device:none  Musculoskeletal: gait and station Limp left, muscle tone and strength are normal, no tremors or atrophy is present.  .  Neurological: coordination overall normal.  Deep tendon reflex/nerve stretch intact.  Sensation normal.  Cranial nerves II-XII intact.   Skin:   Scars of the left ankle but otherwise overall no scars, lesions, ulcers or rashes. No psoriasis.  Psychiatric: Alert and oriented x 3.  Recent memory intact, remote memory unclear.  Normal mood and affect. Well groomed.  Good eye contact.  Cardiovascular: overall no swelling, no varicosities, no edema bilaterally, normal temperatures of the legs and arms, no clubbing, cyanosis and good capillary refill.  Lymphatic: palpation is normal.   Extremities:her left ankle has pain and swelling but no redness.  NV is intact.  The right ankle is normal Inspection scars well healed of the left ankle.  Otherwise both ankles normal Strength and tone normal Range of motion decreased of the left ankle with only about 5 degrees of plantar flexion and dorsiflexion, right ankle normal.  Additional services performed: she has had a left breast problem again. She has had cancer in the breast in the past. She noted a new knot the other week.  She has been evaluated and had a scan.  She is to see the doctor tomorrow.  She is very upset about what they might find.  She started smoking again two days ago after having stopped it last year.  I have told her to try to calm down.  She cannot have any  surgery on the ankle if she is still smoking. She knows this.  We both hope the results tomorrow about the breast are negative.    Her hypertension is under control.   The patient has been educated about the nature of the problem(s) and counseled on treatment options.  The patient appeared to understand what I have discussed and is in agreement with it.  PLAN Call if any problems.  Precautions discussed.  Continue current medications.   Return to clinic 3 months.

## 2015-10-15 NOTE — Patient Instructions (Signed)
Smoking Cessation, Tips for Success If you are ready to quit smoking, congratulations! You have chosen to help yourself be healthier. Cigarettes bring nicotine, tar, carbon monoxide, and other irritants into your body. Your lungs, heart, and blood vessels will be able to work better without these poisons. There are many different ways to quit smoking. Nicotine gum, nicotine patches, a nicotine inhaler, or nicotine nasal spray can help with physical craving. Hypnosis, support groups, and medicines help break the habit of smoking. WHAT THINGS CAN I DO TO MAKE QUITTING EASIER?  Here are some tips to help you quit for good:  Pick a date when you will quit smoking completely. Tell all of your friends and family about your plan to quit on that date.  Do not try to slowly cut down on the number of cigarettes you are smoking. Pick a quit date and quit smoking completely starting on that day.  Throw away all cigarettes.   Clean and remove all ashtrays from your home, work, and car.  On a card, write down your reasons for quitting. Carry the card with you and read it when you get the urge to smoke.  Cleanse your body of nicotine. Drink enough water and fluids to keep your urine clear or pale yellow. Do this after quitting to flush the nicotine from your body.  Learn to predict your moods. Do not let a bad situation be your excuse to have a cigarette. Some situations in your life might tempt you into wanting a cigarette.  Never have "just one" cigarette. It leads to wanting another and another. Remind yourself of your decision to quit.  Change habits associated with smoking. If you smoked while driving or when feeling stressed, try other activities to replace smoking. Stand up when drinking your coffee. Brush your teeth after eating. Sit in a different chair when you read the paper. Avoid alcohol while trying to quit, and try to drink fewer caffeinated beverages. Alcohol and caffeine may urge you to  smoke.  Avoid foods and drinks that can trigger a desire to smoke, such as sugary or spicy foods and alcohol.  Ask people who smoke not to smoke around you.  Have something planned to do right after eating or having a cup of coffee. For example, plan to take a walk or exercise.  Try a relaxation exercise to calm you down and decrease your stress. Remember, you may be tense and nervous for the first 2 weeks after you quit, but this will pass.  Find new activities to keep your hands busy. Play with a pen, coin, or rubber band. Doodle or draw things on paper.  Brush your teeth right after eating. This will help cut down on the craving for the taste of tobacco after meals. You can also try mouthwash.   Use oral substitutes in place of cigarettes. Try using lemon drops, carrots, cinnamon sticks, or chewing gum. Keep them handy so they are available when you have the urge to smoke.  When you have the urge to smoke, try deep breathing.  Designate your home as a nonsmoking area.  If you are a heavy smoker, ask your health care provider about a prescription for nicotine chewing gum. It can ease your withdrawal from nicotine.  Reward yourself. Set aside the cigarette money you save and buy yourself something nice.  Look for support from others. Join a support group or smoking cessation program. Ask someone at home or at work to help you with your plan   to quit smoking.  Always ask yourself, "Do I need this cigarette or is this just a reflex?" Tell yourself, "Today, I choose not to smoke," or "I do not want to smoke." You are reminding yourself of your decision to quit.  Do not replace cigarette smoking with electronic cigarettes (commonly called e-cigarettes). The safety of e-cigarettes is unknown, and some may contain harmful chemicals.  If you relapse, do not give up! Plan ahead and think about what you will do the next time you get the urge to smoke. HOW WILL I FEEL WHEN I QUIT SMOKING? You  may have symptoms of withdrawal because your body is used to nicotine (the addictive substance in cigarettes). You may crave cigarettes, be irritable, feel very hungry, cough often, get headaches, or have difficulty concentrating. The withdrawal symptoms are only temporary. They are strongest when you first quit but will go away within 10-14 days. When withdrawal symptoms occur, stay in control. Think about your reasons for quitting. Remind yourself that these are signs that your body is healing and getting used to being without cigarettes. Remember that withdrawal symptoms are easier to treat than the major diseases that smoking can cause.  Even after the withdrawal is over, expect periodic urges to smoke. However, these cravings are generally short lived and will go away whether you smoke or not. Do not smoke! WHAT RESOURCES ARE AVAILABLE TO HELP ME QUIT SMOKING? Your health care provider can direct you to community resources or hospitals for support, which may include:  Group support.  Education.  Hypnosis.  Therapy.   This information is not intended to replace advice given to you by your health care provider. Make sure you discuss any questions you have with your health care provider.   Document Released: 04/10/2004 Document Revised: 08/03/2014 Document Reviewed: 12/29/2012 Elsevier Interactive Patient Education 2016 [employer] Inc.  

## 2015-10-28 ENCOUNTER — Other Ambulatory Visit: Payer: Self-pay | Admitting: "Endocrinology

## 2015-10-28 DIAGNOSIS — R739 Hyperglycemia, unspecified: Secondary | ICD-10-CM

## 2015-10-28 DIAGNOSIS — E785 Hyperlipidemia, unspecified: Secondary | ICD-10-CM

## 2015-10-28 DIAGNOSIS — E559 Vitamin D deficiency, unspecified: Secondary | ICD-10-CM

## 2015-10-28 DIAGNOSIS — E042 Nontoxic multinodular goiter: Secondary | ICD-10-CM

## 2015-10-29 ENCOUNTER — Ambulatory Visit (HOSPITAL_COMMUNITY): Admission: RE | Admit: 2015-10-29 | Payer: BLUE CROSS/BLUE SHIELD | Source: Ambulatory Visit

## 2015-11-13 ENCOUNTER — Ambulatory Visit (HOSPITAL_COMMUNITY)
Admission: RE | Admit: 2015-11-13 | Discharge: 2015-11-13 | Disposition: A | Discharge status: Home / Self Care | Payer: BLUE CROSS/BLUE SHIELD | Source: Ambulatory Visit | Attending: Internal Medicine | Admitting: Internal Medicine

## 2015-11-13 DIAGNOSIS — I2510 Atherosclerotic heart disease of native coronary artery without angina pectoris: Secondary | ICD-10-CM | POA: Insufficient documentation

## 2015-11-13 DIAGNOSIS — Z9011 Acquired absence of right breast and nipple: Secondary | ICD-10-CM | POA: Insufficient documentation

## 2015-11-13 DIAGNOSIS — Z720 Tobacco use: Secondary | ICD-10-CM | POA: Diagnosis present

## 2015-11-13 DIAGNOSIS — M47814 Spondylosis without myelopathy or radiculopathy, thoracic region: Secondary | ICD-10-CM | POA: Diagnosis not present

## 2015-11-13 DIAGNOSIS — R918 Other nonspecific abnormal finding of lung field: Secondary | ICD-10-CM | POA: Diagnosis not present

## 2015-11-13 DIAGNOSIS — Z87891 Personal history of nicotine dependence: Secondary | ICD-10-CM | POA: Insufficient documentation

## 2015-12-18 ENCOUNTER — Encounter (INDEPENDENT_AMBULATORY_CARE_PROVIDER_SITE_OTHER): Payer: Self-pay | Admitting: *Deleted

## 2016-01-08 ENCOUNTER — Encounter (INDEPENDENT_AMBULATORY_CARE_PROVIDER_SITE_OTHER): Payer: Self-pay | Admitting: Internal Medicine

## 2016-01-15 ENCOUNTER — Ambulatory Visit: Payer: Self-pay | Admitting: Orthopaedic Surgery

## 2016-03-10 ENCOUNTER — Ambulatory Visit (INDEPENDENT_AMBULATORY_CARE_PROVIDER_SITE_OTHER): Payer: Self-pay | Admitting: Internal Medicine

## 2016-03-16 ENCOUNTER — Encounter (INDEPENDENT_AMBULATORY_CARE_PROVIDER_SITE_OTHER): Payer: Self-pay | Admitting: Internal Medicine

## 2016-03-16 ENCOUNTER — Ambulatory Visit (INDEPENDENT_AMBULATORY_CARE_PROVIDER_SITE_OTHER): Payer: Self-pay | Admitting: Internal Medicine

## 2016-12-17 ENCOUNTER — Encounter (INDEPENDENT_AMBULATORY_CARE_PROVIDER_SITE_OTHER): Payer: Self-pay | Admitting: Orthopaedic Surgery

## 2016-12-17 ENCOUNTER — Ambulatory Visit (INDEPENDENT_AMBULATORY_CARE_PROVIDER_SITE_OTHER): Payer: BLUE CROSS/BLUE SHIELD | Admitting: Orthopaedic Surgery

## 2016-12-17 ENCOUNTER — Ambulatory Visit (INDEPENDENT_AMBULATORY_CARE_PROVIDER_SITE_OTHER): Payer: Self-pay

## 2016-12-17 VITALS — BP 106/76 | HR 100 | Ht 64.0 in | Wt 200.0 lb

## 2016-12-17 DIAGNOSIS — M25561 Pain in right knee: Secondary | ICD-10-CM

## 2016-12-17 DIAGNOSIS — G8929 Other chronic pain: Secondary | ICD-10-CM | POA: Diagnosis not present

## 2016-12-17 DIAGNOSIS — M25562 Pain in left knee: Secondary | ICD-10-CM | POA: Diagnosis not present

## 2016-12-17 DIAGNOSIS — M47812 Spondylosis without myelopathy or radiculopathy, cervical region: Secondary | ICD-10-CM

## 2016-12-17 DIAGNOSIS — M542 Cervicalgia: Secondary | ICD-10-CM | POA: Diagnosis not present

## 2016-12-17 NOTE — Progress Notes (Signed)
Office Visit Note   Patient: Claudia [name]           Date of Birth: 10-20-1955           MRN: [id number] Visit Date: 12/17/2016              Requested by: Milus Height, MD [address]. [place], [place] [address] PCP: Milus Height, MD   Assessment & Plan: Visit Diagnoses:  1. Neck pain   2. Chronic pain of both knees   3. Spondylosis without myelopathy or radiculopathy, cervical region   4.       Status post left total ankle arthroplasty  Plan: She tolerated bilateral cortisone injections in her knees. She has more pain in the left and right knee on radiographs right looks slightly worse than left. She has tricompartmental degenerative changes. We'll see she does with this if she has increased back symptoms once proceed with an MRI she will call. She'll work on weight loss try to work on activities that do not load her knee excessively with exercising.  Follow-Up Instructions: No Follow-up on file.   Orders:  Orders Placed This Encounter  Procedures  . XR Cervical Spine 2 or 3 views  . XR KNEE 3 VIEW RIGHT  . XR KNEE 3 VIEW LEFT   No orders of the defined types were placed in this encounter.     Procedures: No procedures performed   Clinical Data: No additional findings.   Subjective: Chief Complaint  Patient presents with  . Left Knee - Pain  . Right Knee - Pain  . Neck - Pain    HPI patient had bilateral knee osteoarthritis previously had Synvisc injection states is no longer covered by her insurance when she had pneumonia passing gave her excellent relief for at least 6 months. She's had problems with neck pain that radiates in her shoulders pain with rotation of her neck aching pain. She is working she had the trimalleolar ankle fracture and eventually ended up with a left total ankle arthroplasty done at [facility] which is very happy with. She states in the interim she gained about 35 pounds) work on weight loss. Both knees are bothering her was standing  walking difficulty getting in and out of a chair.  Review of Systems 14 point review of systems performed positive history of breast cancer. She has significant panic attacks has to have sedation when she is gets dental work. She is rated needles. Previous left trimalleolar ankle fracture with eventual conversion to left total ankle. History of thyroid nodule. Bilateral primary knee osteoarthritis. Otherwise negative as it pertains to history of present illness   Objective: Vital Signs: BP 106/76   Pulse 100   Ht 5\' 4"  (1.626 m)   Wt 200 lb (90.7 kg)   BMI 34.33 kg/m   Physical Exam  Constitutional: She is oriented to person, place, and time. She appears well-developed.  HENT:  Head: Normocephalic.  Right Ear: External ear normal.  Left Ear: External ear normal.  Eyes: Pupils are equal, round, and reactive to light.  Neck: No tracheal deviation present. No thyromegaly present.  Cardiovascular: Normal rate.   Pulmonary/Chest: Effort normal.  Abdominal: Soft.  Musculoskeletal:  Well-healed incision the medial ankle  patella ankle arthrotomy ankle arthroplasty. Distal pulses are good shows range of motion right ankle no significant pain. Crepitus with knee range of motion positive patellofemoral grind. Slightly more medial than lateral laxity of the collateral ligaments anterior cruciate ligament  PCL exam is normal both these rageful extension she flexes 210 bilaterally no pain with hip range of motion. Bilateral brachial plexus tenderness pain with rotation of her neck lateral tilting.   Neurological: She is alert and oriented to person, place, and time.  Skin: Skin is warm and dry.  Psychiatric: She has a normal mood and affect. Her behavior is normal.    Ortho Exam  Specialty Comments:  No specialty comments available.  Imaging: Xr Cervical Spine 2 Or 3 Views  Result Date: 12/17/2016 2 views cervical spine obtained which shows some reversal of cervical curve in the upper  lumbar. C5-6 retrolisthesis with the 50% loss of disc space. Marginal osteophytes and spurring at C6-7 as well. Impression cervical spondylosis worse at C5-6 C6-7.  Xr Knee 3 View Left  Result Date: 12/17/2016 Standing AP both knees lateral left knee and sunrise patellar view x-rays demonstrate the moderate osteoarthritis left knee. Subchondral sclerosis mitral ossified some loss of joint space on the medial lateral joint line. Patellofemoral degenerative spurring noted. Impression moderate left knee osteoarthritis  Xr Knee 3 View Right  Result Date: 12/17/2016 AP lateral sunrise x-rays right knee obtained which shows moderate severe osteoarthritis worse in the medial patellofemoral joint. Joint effusion marginal osteophytes subchondral sclerosis and loss of joint space. Impression moderate osteoarthritis, right knee.    PMFS History: Patient Active Problem List   Diagnosis Date Noted  . Closed fracture of medial malleolus of left ankle with delayed healing 05/29/2014  . Closed trimalleolar fracture of left ankle 10/10/2013  . Thyroid nodule 10/23/2012  . Ataxia 10/22/2012  . Vertigo 10/22/2012  . History of breast cancer 10/22/2012  . Panic disorder 10/22/2012  . Depression 08/31/2012  . Anxiety 08/31/2012   Past Medical History:  Diagnosis Date  . Anxiety   . Breast cancer ([place])   . Cancer of right breast ([place])    mastectomy  . Depression   . GERD (gastroesophageal reflux disease)   . Hiatal hernia   . Hypercholesterolemia   . Hypertension   . Irritable bowel   . PONV (postoperative nausea and vomiting)     Family History  Problem Relation Age of Onset  . Liver disease Mother   . Hypertension Father   . Heart attack Sister     Past Surgical History:  Procedure Laterality Date  . ABDOMINAL HYSTERECTOMY    . CHOLECYSTECTOMY    . FOOT SURGERY     bilateral spur removal  . HARDWARE REMOVAL Left 05/29/2014   Procedure: REMOVAL OF SCREW AND K-WIRE;  Surgeon: Sanjuana Kava, MD;  Location: [facility];  Service: Orthopedics;  Laterality: Left;  Marland Kitchen MASTECTOMY    . ORIF ANKLE FRACTURE Left 10/10/2013   Procedure: OPEN REDUCTION INTERNAL FIXATION (ORIF) ANKLE FRACTURE;  Surgeon: Sanjuana Kava, MD;  Location: [facility];  Service: Orthopedics;  Laterality: Left;  . ORIF ANKLE FRACTURE Left 05/29/2014   Procedure: OPEN REDUCTION INTERNAL FIXATION (ORIF) ANKLE FRACTURE AND PLACEMENT OF BONE GRAFT MATERIAL;  Surgeon: Sanjuana Kava, MD;  Location: [facility];  Service: Orthopedics;  Laterality: Left;  . TONSILLECTOMY    . TOTAL ABDOMINAL HYSTERECTOMY W/ BILATERAL SALPINGOOPHORECTOMY    . TUBAL LIGATION     Social History   Occupational History  . Not on file.   Social History Main Topics  . Smoking status: Current Some Day Smoker    Packs/day: 1.00    Years: 40.00    Types: Cigarettes  . Smokeless tobacco: Never Used  . Alcohol  use No  . Drug use: No  . Sexual activity: No

## 2016-12-18 ENCOUNTER — Emergency Department (HOSPITAL_COMMUNITY): Payer: BLUE CROSS/BLUE SHIELD

## 2016-12-18 ENCOUNTER — Encounter (HOSPITAL_COMMUNITY): Payer: Self-pay | Admitting: Emergency Medicine

## 2016-12-18 ENCOUNTER — Emergency Department (HOSPITAL_COMMUNITY)
Admission: EM | Admit: 2016-12-18 | Discharge: 2016-12-18 | Disposition: A | Discharge status: Home / Self Care | Payer: BLUE CROSS/BLUE SHIELD | Attending: Emergency Medicine | Admitting: Emergency Medicine

## 2016-12-18 DIAGNOSIS — F17210 Nicotine dependence, cigarettes, uncomplicated: Secondary | ICD-10-CM | POA: Diagnosis not present

## 2016-12-18 DIAGNOSIS — J40 Bronchitis, not specified as acute or chronic: Secondary | ICD-10-CM

## 2016-12-18 DIAGNOSIS — Z79899 Other long term (current) drug therapy: Secondary | ICD-10-CM | POA: Diagnosis not present

## 2016-12-18 DIAGNOSIS — Z7982 Long term (current) use of aspirin: Secondary | ICD-10-CM | POA: Insufficient documentation

## 2016-12-18 DIAGNOSIS — Z853 Personal history of malignant neoplasm of breast: Secondary | ICD-10-CM | POA: Diagnosis not present

## 2016-12-18 DIAGNOSIS — I10 Essential (primary) hypertension: Secondary | ICD-10-CM | POA: Diagnosis not present

## 2016-12-18 DIAGNOSIS — R042 Hemoptysis: Secondary | ICD-10-CM | POA: Diagnosis present

## 2016-12-18 LAB — URINALYSIS, ROUTINE W REFLEX MICROSCOPIC
Bilirubin Urine: NEGATIVE
Glucose, UA: NEGATIVE mg/dL
Ketones, ur: NEGATIVE mg/dL
Leukocytes, UA: NEGATIVE
Nitrite: NEGATIVE
PH: 5 (ref 5.0–8.0)
Protein, ur: NEGATIVE mg/dL
SPECIFIC GRAVITY, URINE: 1.023 (ref 1.005–1.030)

## 2016-12-18 LAB — COMPREHENSIVE METABOLIC PANEL
ALT: 14 U/L (ref 14–54)
AST: 16 U/L (ref 15–41)
Albumin: 3.7 g/dL (ref 3.5–5.0)
Alkaline Phosphatase: 82 U/L (ref 38–126)
Anion gap: 9 (ref 5–15)
BILIRUBIN TOTAL: 0.5 mg/dL (ref 0.3–1.2)
BUN: 15 mg/dL (ref 6–20)
CHLORIDE: 106 mmol/L (ref 101–111)
CO2: 23 mmol/L (ref 22–32)
Calcium: 9.1 mg/dL (ref 8.9–10.3)
Creatinine, Ser: 0.63 mg/dL (ref 0.44–1.00)
GFR calc Af Amer: >60 mL/min (ref >60)
Glucose, Bld: 115 mg/dL — ABNORMAL HIGH (ref 65–99)
POTASSIUM: 3.4 mmol/L — AB (ref 3.5–5.1)
Sodium: 138 mmol/L (ref 135–145)
Total Protein: 7.3 g/dL (ref 6.5–8.1)

## 2016-12-18 LAB — CBC
HEMATOCRIT: 41.1 % (ref 36.0–46.0)
Hemoglobin: 13.4 g/dL (ref 12.0–15.0)
MCH: 30 pg (ref 26.0–34.0)
MCHC: 32.6 g/dL (ref 30.0–36.0)
MCV: 92.2 fL (ref 78.0–100.0)
Platelets: 241 10*3/uL (ref 150–400)
RBC: 4.46 MIL/uL (ref 3.87–5.11)
RDW: 13.3 % (ref 11.5–15.5)
WBC: 13.1 10*3/uL — AB (ref 4.0–10.5)

## 2016-12-18 MED ORDER — AZITHROMYCIN 250 MG PO TABS: ORAL_TABLET | ORAL | 0 refills | Status: DC

## 2016-12-18 NOTE — ED Triage Notes (Signed)
Pt reports had two cortisone injections in bilateral knees on 12/17/16. Pt reports "i haven't felt right sense." pt reports general malaise since last night. Pt reports nausea, cough with bright red bloody sputum x1 , intermittent abd pain since this am. nad noted. Pt denies cp,shortness of breath. Pt reports LNBM 12/17/16.

## 2016-12-18 NOTE — ED Notes (Signed)
Pt reports having blood tinged sputum X1 today, denies any CP, SOB, cough, or fever. Hx of breast CA X2, currently smokes.

## 2016-12-18 NOTE — ED Provider Notes (Signed)
[facility] DEPT Provider Note   CSN: [id number] Arrival date & time: 12/18/16  1008  By signing my name below, I, Dora Sims, attest that this documentation has been prepared under the direction and in the presence of physician practitioner, Milton Ferguson, MD. Electronically Signed: Dora Sims, Scribe. 12/18/2016. 11:25 AM.  History   Chief Complaint Chief Complaint  Patient presents with  . Hemoptysis   The history is provided by the patient. No language interpreter was used.  Cough  This is a new problem. The current episode started 3 to 5 hours ago. The problem occurs every few minutes. The problem has not changed since onset.The cough is productive of bloody sputum. There has been no fever. Associated symptoms include headaches. Pertinent negatives include no chest pain and no chills. She has tried nothing for the symptoms. She is a smoker.    HPI Comments: Claudia [name] is a 61 y.o. female with PMHx including HTN who presents to the Emergency Department complaining of an episode of hemoptysis that occurred this morning. She states that shortly after waking she coughed and produced bright red blood. Her cough has persisted without any other episodes of hemoptysis. Patient notes that she started experiencing malaise yesterday shortly after receiving two steroid injections in her bilateral knees. Her malaise has persisted into today and she notes she has developed a headache since waking this morning. No alleviating factors noted. She has a h/o breast cancer in remission for the last 10 years. She states she smokes "off and on". Patient denies fevers, chills, sinus pressure, difficulty urinating, dysuria, or any other associated symptoms.  Past Medical History:  Diagnosis Date  . Anxiety   . Breast cancer ([place])   . Cancer of right breast ([place])    mastectomy  . Depression   . GERD (gastroesophageal reflux disease)   . Hiatal hernia   . Hypercholesterolemia   .  Hypertension   . Irritable bowel   . PONV (postoperative nausea and vomiting)     Patient Active Problem List   Diagnosis Date Noted  . Closed fracture of medial malleolus of left ankle with delayed healing 05/29/2014  . Closed trimalleolar fracture of left ankle 10/10/2013  . Thyroid nodule 10/23/2012  . Ataxia 10/22/2012  . Vertigo 10/22/2012  . History of breast cancer 10/22/2012  . Panic disorder 10/22/2012  . Depression 08/31/2012  . Anxiety 08/31/2012    Past Surgical History:  Procedure Laterality Date  . ABDOMINAL HYSTERECTOMY    . CHOLECYSTECTOMY    . FOOT SURGERY     bilateral spur removal  . HARDWARE REMOVAL Left 05/29/2014   Procedure: REMOVAL OF SCREW AND K-WIRE;  Surgeon: Sanjuana Kava, MD;  Location: [facility];  Service: Orthopedics;  Laterality: Left;  Marland Kitchen MASTECTOMY    . ORIF ANKLE FRACTURE Left 10/10/2013   Procedure: OPEN REDUCTION INTERNAL FIXATION (ORIF) ANKLE FRACTURE;  Surgeon: Sanjuana Kava, MD;  Location: [facility];  Service: Orthopedics;  Laterality: Left;  . ORIF ANKLE FRACTURE Left 05/29/2014   Procedure: OPEN REDUCTION INTERNAL FIXATION (ORIF) ANKLE FRACTURE AND PLACEMENT OF BONE GRAFT MATERIAL;  Surgeon: Sanjuana Kava, MD;  Location: [facility];  Service: Orthopedics;  Laterality: Left;  . TONSILLECTOMY    . TOTAL ABDOMINAL HYSTERECTOMY W/ BILATERAL SALPINGOOPHORECTOMY    . TUBAL LIGATION      OB History    No data available       Home Medications    Prior to Admission medications   Medication Sig Start Date End  Date Taking? Authorizing Provider  alprazolam Duanne Moron) 2 MG tablet Take 2 mg by mouth 4 (four) times daily.   Yes [provider]  amLODipine-benazepril (LOTREL) 10-20 MG capsule Take 1 capsule by mouth daily.   Yes [provider]  aspirin EC 81 MG tablet Take 81 mg by mouth daily.    Yes [provider]  cyanocobalamin ([employer] VITAMIN B12) 1000 MCG tablet Take 2,000 mcg by mouth daily.   Yes [provider]    ezetimibe (ZETIA) 10 MG tablet Take 1 tablet (10 mg total) by mouth daily. 09/02/12  Yes Patrecia Pour, NP  hydrocortisone 2.5 % cream Apply 1 application topically See admin instructions. 2-3 times daily as needed for eczema on face 09/02/12  Yes Lord, Asa Saunas, NP  POTASSIUM PO Take 1 tablet by mouth daily.   Yes [provider]  sertraline (ZOLOFT) 100 MG tablet Take 250 mg by mouth daily.  09/02/12  Yes Patrecia Pour, NP  zolpidem (AMBIEN) 10 MG tablet Take 1 tablet by mouth at bedtime. 10/04/13  Yes [provider]    Family History Family History  Problem Relation Age of Onset  . Liver disease Mother   . Hypertension Father   . Heart attack Sister     Social History Social History  Substance Use Topics  . Smoking status: Current Some Day Smoker    Packs/day: 1.00    Years: 40.00    Types: Cigarettes  . Smokeless tobacco: Never Used  . Alcohol use No     Allergies   Statins   Review of Systems Review of Systems  Constitutional: Negative for appetite change, chills, fatigue and fever.  HENT: Negative for congestion, ear discharge and sinus pressure.   Eyes: Negative for discharge.  Respiratory: Positive for cough (+ one episode of hemoptysis).   Cardiovascular: Negative for chest pain.  Gastrointestinal: Negative for abdominal pain and diarrhea.  Genitourinary: Negative for difficulty urinating, dysuria, frequency and hematuria.  Musculoskeletal: Negative for back pain.  Skin: Negative for rash.  Neurological: Positive for headaches. Negative for seizures.  Psychiatric/Behavioral: Negative for hallucinations.   Physical Exam Updated Vital Signs BP 127/81 (BP Location: Right Arm)   Pulse 94   Temp 97.9 F (36.6 C) (Oral)   Resp 18   Ht 5\' 4"  (1.626 m)   Wt 200 lb (90.7 kg)   SpO2 95%   BMI 34.33 kg/m   Physical Exam  Constitutional: She is oriented to person, place, and time. She appears well-developed.  HENT:  Head: Normocephalic.   Eyes: Conjunctivae and EOM are normal. No scleral icterus.  Neck: Neck supple. No thyromegaly present.  Cardiovascular: Normal rate and regular rhythm.  Exam reveals no gallop and no friction rub.   No murmur heard. Pulmonary/Chest: No stridor. She has wheezes. She has no rales. She exhibits no tenderness.  Minimal wheezing bilaterally.  Abdominal: She exhibits no distension. There is no tenderness. There is no rebound.  Musculoskeletal: Normal range of motion. She exhibits no edema.  Lymphadenopathy:    She has no cervical adenopathy.  Neurological: She is oriented to person, place, and time. She exhibits normal muscle tone. Coordination normal.  Skin: No rash noted. No erythema.  Psychiatric: She has a normal mood and affect. Her behavior is normal.   ED Treatments / Results  Labs (all labs ordered are listed, but only abnormal results are displayed) Labs Reviewed  COMPREHENSIVE METABOLIC PANEL - Abnormal; Notable for the following:  Result Value   Potassium 3.4 (*)    Glucose, Bld 115 (*)    All other components within normal limits  CBC - Abnormal; Notable for the following:    WBC 13.1 (*)    All other components within normal limits  URINALYSIS, ROUTINE W REFLEX MICROSCOPIC - Abnormal; Notable for the following:    APPearance HAZY (*)    Hgb urine dipstick MODERATE (*)    Bacteria, UA RARE (*)    Squamous Epithelial / LPF 0-5 (*)    All other components within normal limits    EKG  EKG Interpretation None       Radiology Dg Chest 2 View  Result Date: 12/18/2016 CLINICAL DATA:  Coughing up bright red blood this morning, hypertension, history of breast cancer post RIGHT mastectomy, occasional smoker EXAM: CHEST  2 VIEW COMPARISON:  08/28/2012; interval chest radiograph of 04/18/2016 is not in PACs for comparison FINDINGS: Normal heart size, mediastinal contours, and pulmonary vascularity. Lungs clear. No pleural effusion or pneumothorax. Surgical clips in RIGHT  axilla. Bones appear demineralized. IMPRESSION: No acute abnormalities. Electronically Signed   By: Lavonia Dana M.D.   On: 12/18/2016 11:52   Xr Cervical Spine 2 Or 3 Views  Result Date: 12/17/2016 2 views cervical spine obtained which shows some reversal of cervical curve in the upper lumbar. C5-6 retrolisthesis with the 50% loss of disc space. Marginal osteophytes and spurring at C6-7 as well. Impression cervical spondylosis worse at C5-6 C6-7.  Xr Knee 3 View Left  Result Date: 12/17/2016 Standing [facility] both knees lateral left knee and sunrise patellar view x-rays demonstrate the moderate osteoarthritis left knee. Subchondral sclerosis mitral ossified some loss of joint space on the medial lateral joint line. Patellofemoral degenerative spurring noted. Impression moderate left knee osteoarthritis  Xr Knee 3 View Right  Result Date: 12/17/2016 [facility] lateral sunrise x-rays right knee obtained which shows moderate severe osteoarthritis worse in the medial patellofemoral joint. Joint effusion marginal osteophytes subchondral sclerosis and loss of joint space. Impression moderate osteoarthritis, right knee.   Procedures Procedures (including critical care time)  DIAGNOSTIC STUDIES: Oxygen Saturation is 95% on RA, adequate by my interpretation.    COORDINATION OF CARE: 11:25 AM Discussed treatment plan with pt at bedside and pt agreed to plan.  Medications Ordered in ED Medications - No data to display   Initial Impression / Assessment and Plan / ED Course  I have reviewed the triage vital signs and the nursing notes.  Pertinent labs & imaging results that were available during my care of the patient were reviewed by me and considered in my medical decision making (see chart for details).     Patient with cough and hemoptysis. White blood count elevated chest x-ray unremarkable. Patient will be placed on antibiotics to cover for bronchitis and will follow-up with PCP if continued  symptoms  Final Clinical Impressions(s) / ED Diagnoses   Final diagnoses:  None    New Prescriptions New Prescriptions   No medications on file  The chart was scribed for me under my direct supervision.  I personally performed the history, physical, and medical decision making and all procedures in the evaluation of this patient.Milton Ferguson, MD 12/18/16 [phone]

## 2016-12-18 NOTE — Discharge Instructions (Signed)
Follow up with your md next week if nor improving

## 2017-02-01 ENCOUNTER — Telehealth (INDEPENDENT_AMBULATORY_CARE_PROVIDER_SITE_OTHER): Payer: Self-pay

## 2017-02-01 NOTE — Telephone Encounter (Signed)
OK - thanks

## 2017-02-01 NOTE — Telephone Encounter (Signed)
Patient called stating that she is usually seen in [place]. Stated that we were supposed to be getting authorization for synvisc one injection for her knee. The cortisone she had was no relief she is in extreme pain. She said that the insurance denied coverage for synvisc one. She called the insurance company and they advised her that if we would do a prior auth they would cover it and if it was done today we should know something so she could have it done on Thursday. I advised patient would pass information along to Dr Lorin Mercy assistant but could not promise her that the PA would be submitted today Please call her at [phone]

## 2017-02-01 NOTE — Telephone Encounter (Signed)
Ok to put in order for Synvisc One and see if I can obtain approval?

## 2017-02-03 NOTE — Telephone Encounter (Signed)
Submitted on synvisc portal, waiting for them to process insurance benefits.

## 2017-02-03 NOTE — Telephone Encounter (Signed)
Benefit verification came back today. Representative from [employer], [employer]) states that both codes are covered under the medical stating that hyaluronate drug as not a medical necessity. Synvisc one or monovisc injection is not covered at all, even with conservative treatment, no prior authorization is not even allowable there is no appeal allowable. I spoke with Marquette Old. Reference number is [id number]. And Tisa I also spoke with synv[employer] representative and was told the same thing. Reference [id number]. I tried calling patient to advise this but there is no answer and no option to leave a voicemail.

## 2017-02-04 ENCOUNTER — Telehealth (INDEPENDENT_AMBULATORY_CARE_PROVIDER_SITE_OTHER): Payer: Self-pay | Admitting: Radiology

## 2017-02-04 NOTE — Telephone Encounter (Signed)
Patient's insurance denied Synvisc One. They do not approve it even if patient has failed conservative treatment. Patient is taking Ibuprofen 800mg  every 4 hours and that does not touch the pain. She had cortisone injections at last visit with no relief. She wants to possibly schedule surgery for total knee in September when she is more financially able to do so. She would like to know if there is anything else that you can recommend for this pain? She is having a hard time after being on her feet for any length of time.

## 2017-02-05 MED ORDER — ACETAMINOPHEN-CODEINE #3 300-30 MG PO TABS: 1.0000 | ORAL_TABLET | Freq: Three times a day (TID) | ORAL | 0 refills | Status: DC | PRN

## 2017-02-05 NOTE — Telephone Encounter (Signed)
Tylenol #3 called in to [place] [facility]. Rx for cane faxed to [facility] at [phone]

## 2017-02-05 NOTE — Telephone Encounter (Signed)
Call in tylenol # 3      Send in # 40  1 po tid prn pain. Send in Rx for four legged walker  To [facility] in [place]. [facility] thanks. She wants Rx sent to [facility] in [place]. thanks

## 2017-02-05 NOTE — Addendum Note (Signed)
Addended by: Meyer Cory on: 02/05/2017 04:41 PM   Modules accepted: Orders

## 2017-02-10 ENCOUNTER — Telehealth (INDEPENDENT_AMBULATORY_CARE_PROVIDER_SITE_OTHER): Payer: Self-pay | Admitting: Orthopaedic Surgery

## 2017-02-10 NOTE — Telephone Encounter (Signed)
I called Claudia [name] and advised.

## 2017-02-10 NOTE — Telephone Encounter (Signed)
Erin from [place] called asking about the last RX sent in, if it was for a chronic pain, acute pain or following surgery. CB # [phone]

## 2017-02-15 ENCOUNTER — Telehealth (INDEPENDENT_AMBULATORY_CARE_PROVIDER_SITE_OTHER): Payer: Self-pay | Admitting: Radiology

## 2017-02-15 NOTE — Telephone Encounter (Signed)
This is the message on the portal for synvisc from their representative.  They are stating synvisc was denied because monovisc or orthovisc was not done first and these are the preferred drugs by her insurance. But that Dr. Lorin Mercy could do a peer to peer if he wishes.ph # [phone]. They need to know within 5 days or they will close her case.   Called [employer] @ [phone] and S/w lori D , As Per rep the Authorization Request For Synvisc-one [[id number]] was Denied on 02/11/2017  Denial Reason:Rep stated that it was denied because the patient has to try at least two preferred medication like mono-visc,ortho-visc  Denial # [id number]  Rep stated that Peer to Peer is Possible now for peer to peer option ph # [phone] Rep stated that Provider Has to send medical records to the Fax # [phone] for appealing  Please leave a message in portal regarding this denial so that we can follow further, If no response is received within 5 business days, the case will be closed. Call Ref# [id number]

## 2017-02-16 NOTE — Telephone Encounter (Signed)
Please advise. We had already informed patient that per her insurance and Synvisc One representatives, it was denied. Do you want to do a peer to peer?

## 2017-02-17 NOTE — Telephone Encounter (Signed)
[facility], check with patient , see if she wants to try monovisc or orthovisc. Then if does not work she can get synvisc

## 2017-08-12 ENCOUNTER — Ambulatory Visit (INDEPENDENT_AMBULATORY_CARE_PROVIDER_SITE_OTHER): Payer: BLUE CROSS/BLUE SHIELD | Admitting: Orthopaedic Surgery

## 2017-08-26 ENCOUNTER — Encounter (INDEPENDENT_AMBULATORY_CARE_PROVIDER_SITE_OTHER): Payer: Self-pay | Admitting: Orthopaedic Surgery

## 2017-08-26 ENCOUNTER — Ambulatory Visit (INDEPENDENT_AMBULATORY_CARE_PROVIDER_SITE_OTHER): Payer: BLUE CROSS/BLUE SHIELD | Admitting: Orthopaedic Surgery

## 2017-08-26 VITALS — BP 135/82 | HR 113 | Ht 64.0 in | Wt 220.0 lb

## 2017-08-26 DIAGNOSIS — M170 Bilateral primary osteoarthritis of knee: Secondary | ICD-10-CM | POA: Diagnosis not present

## 2017-08-26 NOTE — Progress Notes (Signed)
Office Visit Note   Patient: Claudia [name]           Date of Birth: 06-May-1956           MRN: [id number] Visit Date: 08/26/2017              Requested by: No referring provider defined for this encounter. PCP: System, Provider Not In   Assessment & Plan: Visit Diagnoses:  1. Bilateral primary osteoarthritis of knee     Plan: Patient is ready to proceed with left total knee arthroplasty she is used anti-inflammatories, Tylenol, intra-articular cortisone injections, Visco supplement injections multiple times with continued pain.  She is used a walker as well as cane and states she is having trouble sleeping.  Left total knee arthroplasty discussed.  Postoperative therapy discussed home therapy for 2 weeks as well as outpatient therapy.  Questions were elicited and answered she understands and requests we proceed.  Follow-Up Instructions: No Follow-up on file.   Orders:  No orders of the defined types were placed in this encounter.  No orders of the defined types were placed in this encounter.     Procedures: No procedures performed   Clinical Data: No additional findings.   Subjective: Chief Complaint  Patient presents with  . Left Knee - Pain    HPI 62-year-old female returns with bilateral knee osteoarthritis with much worse symptoms on the left than right.  I have not seen her since last year and she states she is ready to schedule her total knee arthroplasty and she cannot take the pain any longer.  She had left ankle arthroplasty done at [facility] 2017 which is doing well.  She is had multiple cortisone injections multiple Visco supplement injections in the past.  She is used in the past also used a cane.  Knee bothers her at night is difficulty sleeping she has problems when she ambulates in the community and goes to the store.  Works at [employer] and states the knee pain is progressed the point where she is concerned she cannot do her job.  Review of Systems positive  history of breast cancer thousand 4 and 2006 on the right breast with lumpectomy and then later breast reconstruction.  Positive for significant panic attacks.  Previous left trimalleolar ankle fracture with left osteoarthritis and then total ankle arthroplasty.  History of thyroid nodule not malignant.  Otherwise negative as it pertains HPI.   Objective: Vital Signs: BP 135/82   Pulse (!) 113   Ht 5\' 4"  (1.626 m)   Wt 220 lb (99.8 kg)   BMI 37.76 kg/m   Physical Exam  Constitutional: She is oriented to person, place, and time. She appears well-developed.  HENT:  Head: Normocephalic.  Right Ear: External ear normal.  Left Ear: External ear normal.  Eyes: Pupils are equal, round, and reactive to light.  Neck: No tracheal deviation present. No thyromegaly present.  Cardiovascular: Normal rate.  Pulmonary/Chest: Effort normal.  Abdominal: Soft.  Neurological: She is alert and oriented to person, place, and time.  Skin: Skin is warm and dry.  Psychiatric: She has a normal mood and affect. Her behavior is normal.    Ortho Exam has crepitus with knee range of motion palpable medial lateral osteophytes 2+ knee effusion.  Distal pulses are intact.  Healed left total ankle arthroplasty incision.  Sensation her foot is intact.  Collateral ligaments are stable.  Knee has full extension and flexes to 10 degrees limited by knee effusion.  Specialty Comments:  No specialty comments available.  Imaging: Standing x-rays show tricompartmental degenerative changes with medial compartment flattening of the femoral condyle marginal osteophyte subchondral sclerosis and subchondral cyst formation.  Lateral knee osteoarthritis.   PMFS History: Patient Active Problem List   Diagnosis Date Noted  . Closed fracture of medial malleolus of left ankle with delayed healing 05/29/2014  . Closed trimalleolar fracture of left ankle 10/10/2013  . Thyroid nodule 10/23/2012  . Ataxia 10/22/2012  . Vertigo  10/22/2012  . History of breast cancer 10/22/2012  . Panic disorder 10/22/2012  . Depression 08/31/2012  . Anxiety 08/31/2012   Past Medical History:  Diagnosis Date  . Anxiety   . Breast cancer ([place])   . Cancer of right breast ([place])    mastectomy  . Depression   . GERD (gastroesophageal reflux disease)   . Hiatal hernia   . Hypercholesterolemia   . Hypertension   . Irritable bowel   . PONV (postoperative nausea and vomiting)     Family History  Problem Relation Age of Onset  . Liver disease Mother   . Hypertension Father   . Heart attack Sister     Past Surgical History:  Procedure Laterality Date  . ABDOMINAL HYSTERECTOMY    . CHOLECYSTECTOMY    . FOOT SURGERY     bilateral spur removal  . HARDWARE REMOVAL Left 05/29/2014   Procedure: REMOVAL OF SCREW AND K-WIRE;  Surgeon: Sanjuana Kava, MD;  Location: [facility];  Service: Orthopedics;  Laterality: Left;  Marland Kitchen MASTECTOMY    . ORIF ANKLE FRACTURE Left 10/10/2013   Procedure: OPEN REDUCTION INTERNAL FIXATION (ORIF) ANKLE FRACTURE;  Surgeon: Sanjuana Kava, MD;  Location: [facility];  Service: Orthopedics;  Laterality: Left;  . ORIF ANKLE FRACTURE Left 05/29/2014   Procedure: OPEN REDUCTION INTERNAL FIXATION (ORIF) ANKLE FRACTURE AND PLACEMENT OF BONE GRAFT MATERIAL;  Surgeon: Sanjuana Kava, MD;  Location: [facility];  Service: Orthopedics;  Laterality: Left;  . TONSILLECTOMY    . TOTAL ABDOMINAL HYSTERECTOMY W/ BILATERAL SALPINGOOPHORECTOMY    . TUBAL LIGATION     Social History   Occupational History  . Not on file  Tobacco Use  . Smoking status: Current Some Day Smoker    Packs/day: 1.00    Years: 40.00    Pack years: 40.00    Types: Cigarettes  . Smokeless tobacco: Never Used  Substance and Sexual Activity  . Alcohol use: No  . Drug use: No  . Sexual activity: No    Birth control/protection: None

## 2017-08-30 ENCOUNTER — Telehealth (INDEPENDENT_AMBULATORY_CARE_PROVIDER_SITE_OTHER): Payer: Self-pay | Admitting: Orthopaedic Surgery

## 2017-08-30 NOTE — Telephone Encounter (Signed)
Patient states shes left multiple messages about getting a surgery with Dr. Lorin Mercy scheduled. She needs to make preparations with work pretty soon if you could give her a call when possible. # [phone]

## 2017-09-02 ENCOUNTER — Other Ambulatory Visit (INDEPENDENT_AMBULATORY_CARE_PROVIDER_SITE_OTHER): Payer: Self-pay

## 2017-09-13 ENCOUNTER — Ambulatory Visit (HOSPITAL_COMMUNITY)
Admission: RE | Admit: 2017-09-13 | Discharge: 2017-09-13 | Disposition: A | Discharge status: Home / Self Care | Payer: BLUE CROSS/BLUE SHIELD | Source: Ambulatory Visit | Attending: Surgery | Admitting: Surgery

## 2017-09-13 ENCOUNTER — Other Ambulatory Visit: Payer: Self-pay

## 2017-09-13 ENCOUNTER — Encounter (HOSPITAL_COMMUNITY): Payer: Self-pay

## 2017-09-13 ENCOUNTER — Encounter (HOSPITAL_COMMUNITY)
Admission: RE | Admit: 2017-09-13 | Discharge: 2017-09-13 | Disposition: A | Discharge status: Home / Self Care | Payer: BLUE CROSS/BLUE SHIELD | Source: Ambulatory Visit | Attending: Orthopaedic Surgery | Admitting: Orthopaedic Surgery

## 2017-09-13 DIAGNOSIS — R918 Other nonspecific abnormal finding of lung field: Secondary | ICD-10-CM | POA: Insufficient documentation

## 2017-09-13 DIAGNOSIS — Z01810 Encounter for preprocedural cardiovascular examination: Secondary | ICD-10-CM | POA: Insufficient documentation

## 2017-09-13 DIAGNOSIS — Z01818 Encounter for other preprocedural examination: Secondary | ICD-10-CM | POA: Diagnosis not present

## 2017-09-13 LAB — URINALYSIS, ROUTINE W REFLEX MICROSCOPIC
BACTERIA UA: NONE SEEN
Bilirubin Urine: NEGATIVE
Glucose, UA: NEGATIVE mg/dL
KETONES UR: NEGATIVE mg/dL
LEUKOCYTES UA: NEGATIVE
Nitrite: NEGATIVE
PROTEIN: NEGATIVE mg/dL
Specific Gravity, Urine: 1.018 (ref 1.005–1.030)
pH: 5 (ref 5.0–8.0)

## 2017-09-13 LAB — SURGICAL PCR SCREEN
MRSA, PCR: NEGATIVE
STAPHYLOCOCCUS AUREUS: POSITIVE — AB

## 2017-09-13 LAB — CBC
HCT: 42 % (ref 36.0–46.0)
HEMOGLOBIN: 13.6 g/dL (ref 12.0–15.0)
MCH: 29.6 pg (ref 26.0–34.0)
MCHC: 32.4 g/dL (ref 30.0–36.0)
MCV: 91.3 fL (ref 78.0–100.0)
Platelets: 215 10*3/uL (ref 150–400)
RBC: 4.6 MIL/uL (ref 3.87–5.11)
RDW: 14.8 % (ref 11.5–15.5)
WBC: 9.6 10*3/uL (ref 4.0–10.5)

## 2017-09-13 LAB — COMPREHENSIVE METABOLIC PANEL
ALK PHOS: 80 U/L (ref 38–126)
ALT: 14 U/L (ref 14–54)
ANION GAP: 9 (ref 5–15)
AST: 17 U/L (ref 15–41)
Albumin: 3.8 g/dL (ref 3.5–5.0)
BUN: 12 mg/dL (ref 6–20)
CALCIUM: 9 mg/dL (ref 8.9–10.3)
CO2: 21 mmol/L — AB (ref 22–32)
Chloride: 109 mmol/L (ref 101–111)
Creatinine, Ser: 0.72 mg/dL (ref 0.44–1.00)
GFR calc Af Amer: >60 mL/min (ref >60)
GFR calc non Af Amer: >60 mL/min (ref >60)
GLUCOSE: 90 mg/dL (ref 65–99)
POTASSIUM: 4 mmol/L (ref 3.5–5.1)
SODIUM: 139 mmol/L (ref 135–145)
Total Bilirubin: 0.6 mg/dL (ref 0.3–1.2)
Total Protein: 7.2 g/dL (ref 6.5–8.1)

## 2017-09-13 NOTE — Progress Notes (Signed)
PCP - Dr. Ventura Sellers, [place], [place] Cardiologist - patient denies  Chest x-ray - 09/13/2017 EKG - 09/13/2017 Stress Test - patient unsure ECHO - 10/24/2012 Cardiac Cath - patient denies  Sleep Study - patient denies  Anesthesia review: n/a  Patient denies shortness of breath, fever, cough and chest pain at PAT appointment   Patient verbalized understanding of instructions that were given to them at the PAT appointment. Patient was also instructed that they will need to review over the PAT instructions again at home before surgery.

## 2017-09-13 NOTE — Pre-Procedure Instructions (Signed)
Claudia [name]  09/13/2017      [facility] - [address], [address] [place] [address] Phone: [phone] Fax: [phone]    Your procedure is scheduled on 09/20/2017.  Report to [facility] Admitting at 1030 A.M.  Call this number if you have problems the morning of surgery:  [phone]   Remember:  Do not eat food or drink liquids after midnight.   Continue all medications as directed by your physician except follow these medication instructions before surgery below   Take these medicines the morning of surgery with A SIP OF WATER: Alprazolam (Xanax) Certirizine (Zyrtec) - if needed for allergies Hydrocodone-acetaminophen (Norco) - if needed for pain Sertraline (Zoloft)  7 days prior to surgery STOP taking any Aspirin(unless otherwise instructed by your surgeon), Aleve, Naproxen, Ibuprofen, Motrin, Advil, Goody's, BC's, all herbal medications, fish oil, and all vitamins    Do not wear jewelry, make-up or nail polish.  Do not wear lotions, powders, or perfumes, or deodorant.  Do not shave 48 hours prior to surgery.    Do not bring valuables to the hospital.  [facility] is not responsible for any belongings or valuables.  Hearing aids, eyeglasses, contacts, dentures or partials may not be worn into surgery.  Leave your suitcase in the car.  After surgery it may be brought to your room.  For patients admitted to the hospital, discharge time will be determined by your treatment team.  Patients discharged the day of surgery will not be allowed to drive home.   Name and phone number of your driver:    Special instructions:   [facility]- Preparing For Surgery  Before surgery, you can play an important role. Because skin is not sterile, your skin needs to be as free of germs as possible. You can reduce the number of germs on your skin by washing with CHG (chlorahexidine gluconate) Soap before surgery.  CHG is an  antiseptic cleaner which kills germs and bonds with the skin to continue killing germs even after washing.  Please do not use if you have an allergy to CHG or antibacterial soaps. If your skin becomes reddened/irritated stop using the CHG.  Do not shave (including legs and underarms) for at least 48 hours prior to first CHG shower. It is OK to shave your face.  Please follow these instructions carefully.   1. Shower the NIGHT BEFORE SURGERY and the MORNING OF SURGERY with CHG.   2. If you chose to wash your hair, wash your hair first as usual with your normal shampoo.  3. After you shampoo, rinse your hair and body thoroughly to remove the shampoo.  4. Use CHG as you would any other liquid soap. You can apply CHG directly to the skin and wash gently with a scrungie or a clean washcloth.   5. Apply the CHG Soap to your body ONLY FROM THE NECK DOWN.  Do not use on open wounds or open sores. Avoid contact with your eyes, ears, mouth and genitals (private parts). Wash Face and genitals (private parts)  with your normal soap.  6. Wash thoroughly, paying special attention to the area where your surgery will be performed.  7. Thoroughly rinse your body with warm water from the neck down.  8. DO NOT shower/wash with your normal soap after using and rinsing off the CHG Soap.  9. Pat yourself dry with a CLEAN TOWEL.  10.  Wear CLEAN PAJAMAS to bed the night before surgery, wear comfortable clothes the morning of surgery  11. Place CLEAN SHEETS on your bed the night of your first shower and DO NOT SLEEP WITH PETS.    Day of Surgery: Shower as stated above. Do not apply any deodorants/lotions. Please wear clean clothes to the hospital/surgery center.      Please read over the following fact sheets that you were given.

## 2017-09-13 NOTE — Progress Notes (Signed)
Mupirocin Ointment Rx called into [facility] in [place] for positive PCR of Staph. Pt notified and voiced understanding.

## 2017-09-17 NOTE — H&P (Signed)
TOTAL KNEE ADMISSION H&P  Patient is being admitted for left total knee arthroplasty.  Subjective:  Chief Complaint:left knee pain.  HPI: Claudia [name], 62 y.o. female, has a history of pain and functional disability in the left knee due to arthritis and has failed non-surgical conservative treatments for greater than 12 weeks to includeNSAID's and/or analgesics, corticosteriod injections, use of assistive devices and activity modification.  Onset of symptoms was gradual, starting 10 years ago with gradually worsening course since that time.   Patient currently rates pain in the left knee(s) at 10 out of 10 with activity. Patient has night pain, worsening of pain with activity and weight bearing, pain that interferes with activities of daily living, pain with passive range of motion, crepitus and joint swelling.  Patient has evidence of subchondral sclerosis, periarticular osteophytes and joint space narrowing by imaging studies. There is no active infection.  Patient Active Problem List   Diagnosis Date Noted  . Closed fracture of medial malleolus of left ankle with delayed healing 05/29/2014  . Closed trimalleolar fracture of left ankle 10/10/2013  . Thyroid nodule 10/23/2012  . Ataxia 10/22/2012  . Vertigo 10/22/2012  . History of breast cancer 10/22/2012  . Panic disorder 10/22/2012  . Depression 08/31/2012  . Anxiety 08/31/2012   Past Medical History:  Diagnosis Date  . Anxiety   . Breast cancer ([place])   . Cancer of right breast ([place])    mastectomy  . Depression   . GERD (gastroesophageal reflux disease)   . Hiatal hernia   . Hypercholesterolemia   . Hypertension   . Irritable bowel   . PONV (postoperative nausea and vomiting)     Past Surgical History:  Procedure Laterality Date  . ABDOMINAL HYSTERECTOMY    . BREAST SURGERY     lumpectomy  . CHOLECYSTECTOMY    . FOOT SURGERY     bilateral spur removal  . HARDWARE REMOVAL Left 05/29/2014   Procedure: REMOVAL OF  SCREW AND K-WIRE;  Surgeon: Sanjuana Kava, MD;  Location: [facility];  Service: Orthopedics;  Laterality: Left;  . JOINT REPLACEMENT Left 2017   Ankle  . MASTECTOMY    . ORIF ANKLE FRACTURE Left 10/10/2013   Procedure: OPEN REDUCTION INTERNAL FIXATION (ORIF) ANKLE FRACTURE;  Surgeon: Sanjuana Kava, MD;  Location: [facility];  Service: Orthopedics;  Laterality: Left;  . ORIF ANKLE FRACTURE Left 05/29/2014   Procedure: OPEN REDUCTION INTERNAL FIXATION (ORIF) ANKLE FRACTURE AND PLACEMENT OF BONE GRAFT MATERIAL;  Surgeon: Sanjuana Kava, MD;  Location: [facility];  Service: Orthopedics;  Laterality: Left;  . TONSILLECTOMY    . TOTAL ABDOMINAL HYSTERECTOMY W/ BILATERAL SALPINGOOPHORECTOMY    . TUBAL LIGATION      No current facility-administered medications for this encounter.    Current Outpatient Medications  Medication Sig Dispense Refill Last Dose  . alprazolam (XANAX) 2 MG tablet Take 2 mg by mouth 4 (four) times daily.   Taking  . amLODipine-benazepril (LOTREL) 10-20 MG capsule Take 1 capsule by mouth daily.   Taking  . cetirizine (ZYRTEC) 10 MG tablet Take 10 mg by mouth daily as needed for allergies.     Marland Kitchen ezetimibe (ZETIA) 10 MG tablet Take 1 tablet (10 mg total) by mouth daily. 30 tablet 0 Taking  . HYDROcodone-acetaminophen (NORCO) 10-325 MG tablet Take 1 tablet by mouth 3 (three) times daily as needed for severe pain.    Taking  . hydrocortisone 2.5 % cream Apply 1 application topically See admin instructions. 2-3 times daily  as needed for eczema on face 30 g 0 Taking  . sertraline (ZOLOFT) 100 MG tablet Take 250 mg by mouth daily.    Taking  . zolpidem (AMBIEN) 10 MG tablet Take 10 mg by mouth at bedtime as needed for sleep.    Taking   Allergies  Allergen Reactions  . Statins Other (See Comments)    Muscle pain    Social History   Tobacco Use  . Smoking status: Current Some Day Smoker    Packs/day: 0.25    Years: 40.00    Pack years: 10.00    Types: Cigarettes  . Smokeless tobacco:  Never Used  Substance Use Topics  . Alcohol use: No    Family History  Problem Relation Age of Onset  . Liver disease Mother   . Hypertension Father   . Heart attack Sister      Review of Systems  Constitutional: Negative.   HENT: Negative.   Respiratory: Negative.   Cardiovascular: Negative.   Genitourinary: Negative.   Musculoskeletal: Positive for joint pain.  Skin: Negative.   Neurological: Negative.   Psychiatric/Behavioral: Negative.     Objective:  Physical Exam  Constitutional: She is oriented to person, place, and time. No distress.  HENT:  Head: Normocephalic and atraumatic.  Eyes: Pupils are equal, round, and reactive to light.  Respiratory: No respiratory distress.  GI: She exhibits no distension.  Musculoskeletal: She exhibits tenderness.  Neurological: She is alert and oriented to person, place, and time.  Skin: Skin is warm and dry.  Psychiatric: She has a normal mood and affect.    Vital signs in last 24 hours:    Labs:   Estimated body mass index is 37.13 kg/m as calculated from the following:   Height as of 09/13/17: 5\' 4"  (1.626 m).   Weight as of 09/13/17: 216 lb 4.8 oz (98.1 kg).   Imaging Review Plain radiographs demonstrate moderate degenerative joint disease of the left knee(s). The overall alignment ismild varus. The bone quality appears to be good for age and reported activity level.  Assessment/Plan:  End stage arthritis, left knee   The patient history, physical examination, clinical judgment of the provider and imaging studies are consistent with end stage degenerative joint disease of the left knee(s) and total knee arthroplasty is deemed medically necessary. The treatment options including medical management, injection therapy arthroscopy and arthroplasty were discussed at length. The risks and benefits of total knee arthroplasty were presented and reviewed. The risks due to aseptic loosening, infection, stiffness, patella tracking  problems, thromboembolic complications and other imponderables were discussed. The patient acknowledged the explanation, agreed to proceed with the plan and consent was signed. Patient is being admitted for inpatient treatment for surgery, pain control, PT, OT, prophylactic antibiotics, VTE prophylaxis, progressive ambulation and ADL's and discharge planning. The patient is planning to be discharged home with home health services

## 2017-09-20 ENCOUNTER — Inpatient Hospital Stay (HOSPITAL_COMMUNITY)
Admission: RE | Admit: 2017-09-20 | Discharge: 2017-09-23 | DRG: 470 | Disposition: A | Discharge status: Home Health | Payer: BLUE CROSS/BLUE SHIELD | Source: Ambulatory Visit | Attending: Orthopaedic Surgery | Admitting: Orthopaedic Surgery

## 2017-09-20 ENCOUNTER — Inpatient Hospital Stay (HOSPITAL_COMMUNITY): Payer: BLUE CROSS/BLUE SHIELD | Admitting: Certified Registered"

## 2017-09-20 ENCOUNTER — Encounter (HOSPITAL_COMMUNITY): Payer: Self-pay

## 2017-09-20 ENCOUNTER — Encounter (HOSPITAL_COMMUNITY)
Admission: RE | Disposition: A | Discharge status: Home Health | Payer: Self-pay | Source: Ambulatory Visit | Attending: Orthopaedic Surgery

## 2017-09-20 ENCOUNTER — Inpatient Hospital Stay (HOSPITAL_COMMUNITY): Payer: BLUE CROSS/BLUE SHIELD

## 2017-09-20 DIAGNOSIS — Z90710 Acquired absence of both cervix and uterus: Secondary | ICD-10-CM | POA: Diagnosis not present

## 2017-09-20 DIAGNOSIS — Z8249 Family history of ischemic heart disease and other diseases of the circulatory system: Secondary | ICD-10-CM

## 2017-09-20 DIAGNOSIS — F17210 Nicotine dependence, cigarettes, uncomplicated: Secondary | ICD-10-CM | POA: Diagnosis present

## 2017-09-20 DIAGNOSIS — F419 Anxiety disorder, unspecified: Secondary | ICD-10-CM | POA: Diagnosis present

## 2017-09-20 DIAGNOSIS — Z9011 Acquired absence of right breast and nipple: Secondary | ICD-10-CM

## 2017-09-20 DIAGNOSIS — Z96662 Presence of left artificial ankle joint: Secondary | ICD-10-CM | POA: Diagnosis present

## 2017-09-20 DIAGNOSIS — Z90722 Acquired absence of ovaries, bilateral: Secondary | ICD-10-CM

## 2017-09-20 DIAGNOSIS — I10 Essential (primary) hypertension: Secondary | ICD-10-CM | POA: Diagnosis present

## 2017-09-20 DIAGNOSIS — Z09 Encounter for follow-up examination after completed treatment for conditions other than malignant neoplasm: Secondary | ICD-10-CM

## 2017-09-20 DIAGNOSIS — M1712 Unilateral primary osteoarthritis, left knee: Secondary | ICD-10-CM | POA: Diagnosis present

## 2017-09-20 DIAGNOSIS — Z853 Personal history of malignant neoplasm of breast: Secondary | ICD-10-CM

## 2017-09-20 DIAGNOSIS — E7800 Pure hypercholesterolemia, unspecified: Secondary | ICD-10-CM | POA: Diagnosis present

## 2017-09-20 HISTORY — PX: TOTAL KNEE ARTHROPLASTY: SHX125

## 2017-09-20 SURGERY — ARTHROPLASTY, KNEE, TOTAL
Anesthesia: Spinal | Site: Knee | Laterality: Left

## 2017-09-20 MED ORDER — KETOROLAC TROMETHAMINE 30 MG/ML IJ SOLN
INTRAMUSCULAR | Status: AC
  Filled 2017-09-20: qty 1

## 2017-09-20 MED ORDER — CHLORHEXIDINE GLUCONATE 4 % EX LIQD
60.0000 mL | Freq: Once | CUTANEOUS | Status: DC
  Administered 2017-09-20: 4 via TOPICAL

## 2017-09-20 MED ORDER — ASPIRIN EC 325 MG PO TBEC
325.0000 mg | DELAYED_RELEASE_TABLET | Freq: Every day | ORAL | Status: DC
Start: 2017-09-21 — End: 2017-09-23
  Administered 2017-09-21 – 2017-09-23 (×3): 325 mg via ORAL
  Filled 2017-09-20 (×3): qty 1

## 2017-09-20 MED ORDER — METOCLOPRAMIDE HCL 5 MG/ML IJ SOLN
INTRAMUSCULAR | Status: AC
  Administered 2017-09-20: 15:00:00
  Filled 2017-09-20: qty 2

## 2017-09-20 MED ORDER — LACTATED RINGERS IV SOLN: INTRAVENOUS | Status: DC

## 2017-09-20 MED ORDER — OXYCODONE HCL 5 MG PO TABS
5.0000 mg | ORAL_TABLET | Freq: Four times a day (QID) | ORAL | Status: DC | PRN
  Administered 2017-09-20 – 2017-09-21 (×2): 5 mg via ORAL
  Filled 2017-09-20 (×2): qty 1

## 2017-09-20 MED ORDER — PROPOFOL 10 MG/ML IV BOLUS
INTRAVENOUS | Status: DC | PRN
  Administered 2017-09-20: 20 mg via INTRAVENOUS

## 2017-09-20 MED ORDER — SODIUM CHLORIDE 0.9 % IV SOLN
INTRAVENOUS | Status: DC
  Administered 2017-09-20: 17:00:00 via INTRAVENOUS

## 2017-09-20 MED ORDER — PROPOFOL 500 MG/50ML IV EMUL
INTRAVENOUS | Status: DC | PRN
  Administered 2017-09-20: 100 ug/kg/min via INTRAVENOUS

## 2017-09-20 MED ORDER — DEXAMETHASONE SODIUM PHOSPHATE 10 MG/ML IJ SOLN
INTRAMUSCULAR | Status: AC
  Filled 2017-09-20: qty 1

## 2017-09-20 MED ORDER — ONDANSETRON HCL 4 MG/2ML IJ SOLN
4.0000 mg | Freq: Four times a day (QID) | INTRAMUSCULAR | Status: DC | PRN
  Administered 2017-09-21: 4 mg via INTRAVENOUS
  Filled 2017-09-20: qty 2

## 2017-09-20 MED ORDER — METOCLOPRAMIDE HCL 5 MG/ML IJ SOLN: 5.0000 mg | Freq: Three times a day (TID) | INTRAMUSCULAR | Status: DC | PRN

## 2017-09-20 MED ORDER — EZETIMIBE 10 MG PO TABS
10.0000 mg | ORAL_TABLET | Freq: Every day | ORAL | Status: DC
  Administered 2017-09-20 – 2017-09-23 (×4): 10 mg via ORAL
  Filled 2017-09-20 (×4): qty 1

## 2017-09-20 MED ORDER — SODIUM CHLORIDE 0.9 % IR SOLN
Status: DC | PRN
  Administered 2017-09-20: 1000 mL
  Administered 2017-09-20: 3000 mL

## 2017-09-20 MED ORDER — FENTANYL CITRATE (PF) 100 MCG/2ML IJ SOLN: 25.0000 ug | INTRAMUSCULAR | Status: DC | PRN

## 2017-09-20 MED ORDER — SERTRALINE HCL 50 MG PO TABS
250.0000 mg | ORAL_TABLET | Freq: Every day | ORAL | Status: DC
  Administered 2017-09-21 – 2017-09-23 (×3): 250 mg via ORAL
  Filled 2017-09-20 (×3): qty 1

## 2017-09-20 MED ORDER — FENTANYL CITRATE (PF) 100 MCG/2ML IJ SOLN
INTRAMUSCULAR | Status: AC
  Administered 2017-09-20: 100 ug
  Filled 2017-09-20: qty 2

## 2017-09-20 MED ORDER — ONDANSETRON HCL 4 MG PO TABS: 4.0000 mg | ORAL_TABLET | Freq: Four times a day (QID) | ORAL | Status: DC | PRN

## 2017-09-20 MED ORDER — BUPIVACAINE HCL (PF) 0.25 % IJ SOLN
INTRAMUSCULAR | Status: DC | PRN
  Administered 2017-09-20: 20 mL via INTRA_ARTICULAR

## 2017-09-20 MED ORDER — CEFAZOLIN SODIUM-DEXTROSE 2-4 GM/100ML-% IV SOLN
2.0000 g | INTRAVENOUS | Status: AC
  Administered 2017-09-20: 2 g via INTRAVENOUS

## 2017-09-20 MED ORDER — METOCLOPRAMIDE HCL 5 MG PO TABS: 5.0000 mg | ORAL_TABLET | Freq: Three times a day (TID) | ORAL | Status: DC | PRN

## 2017-09-20 MED ORDER — POLYETHYLENE GLYCOL 3350 17 G PO PACK: 17.0000 g | PACK | Freq: Every day | ORAL | Status: DC | PRN

## 2017-09-20 MED ORDER — CEFAZOLIN SODIUM-DEXTROSE 2-4 GM/100ML-% IV SOLN
INTRAVENOUS | Status: AC
  Filled 2017-09-20: qty 100

## 2017-09-20 MED ORDER — ONDANSETRON HCL 4 MG/2ML IJ SOLN
INTRAMUSCULAR | Status: AC
  Filled 2017-09-20: qty 2

## 2017-09-20 MED ORDER — BUPIVACAINE IN DEXTROSE 0.75-8.25 % IT SOLN
INTRATHECAL | Status: DC | PRN
  Administered 2017-09-20: 1.6 mL via INTRATHECAL

## 2017-09-20 MED ORDER — CEFAZOLIN SODIUM-DEXTROSE 1-4 GM/50ML-% IV SOLN
1.0000 g | Freq: Three times a day (TID) | INTRAVENOUS | Status: AC
  Administered 2017-09-20 – 2017-09-21 (×2): 1 g via INTRAVENOUS
  Filled 2017-09-20 (×2): qty 50

## 2017-09-20 MED ORDER — AMLODIPINE BESY-BENAZEPRIL HCL 10-20 MG PO CAPS: 1.0000 | ORAL_CAPSULE | Freq: Every day | ORAL | Status: DC

## 2017-09-20 MED ORDER — PROPOFOL 10 MG/ML IV BOLUS
INTRAVENOUS | Status: AC
  Filled 2017-09-20: qty 20

## 2017-09-20 MED ORDER — ACETAMINOPHEN 650 MG RE SUPP: 650.0000 mg | RECTAL | Status: DC | PRN

## 2017-09-20 MED ORDER — DOCUSATE SODIUM 100 MG PO CAPS
100.0000 mg | ORAL_CAPSULE | Freq: Two times a day (BID) | ORAL | Status: DC
  Administered 2017-09-20 – 2017-09-23 (×6): 100 mg via ORAL
  Filled 2017-09-20 (×6): qty 1

## 2017-09-20 MED ORDER — ONDANSETRON HCL 4 MG/2ML IJ SOLN
INTRAMUSCULAR | Status: DC | PRN
  Administered 2017-09-20: 4 mg via INTRAVENOUS

## 2017-09-20 MED ORDER — ROPIVACAINE HCL 7.5 MG/ML IJ SOLN
INTRAMUSCULAR | Status: DC | PRN
  Administered 2017-09-20: 20 mL via PERINEURAL

## 2017-09-20 MED ORDER — BENAZEPRIL HCL 20 MG PO TABS
20.0000 mg | ORAL_TABLET | Freq: Every day | ORAL | Status: DC
  Administered 2017-09-20 – 2017-09-23 (×4): 20 mg via ORAL
  Filled 2017-09-20 (×4): qty 1

## 2017-09-20 MED ORDER — METHOCARBAMOL 500 MG PO TABS
500.0000 mg | ORAL_TABLET | Freq: Four times a day (QID) | ORAL | Status: DC | PRN
  Administered 2017-09-20 – 2017-09-23 (×8): 500 mg via ORAL
  Filled 2017-09-20 (×8): qty 1

## 2017-09-20 MED ORDER — LORATADINE 10 MG PO TABS
10.0000 mg | ORAL_TABLET | Freq: Every day | ORAL | Status: DC
  Administered 2017-09-20 – 2017-09-23 (×4): 10 mg via ORAL
  Filled 2017-09-20 (×4): qty 1

## 2017-09-20 MED ORDER — BUPIVACAINE LIPOSOME 1.3 % IJ SUSP
20.0000 mL | INTRAMUSCULAR | Status: AC
  Administered 2017-09-20: 20 mL
  Filled 2017-09-20: qty 20

## 2017-09-20 MED ORDER — MIDAZOLAM HCL 5 MG/5ML IJ SOLN
INTRAMUSCULAR | Status: DC | PRN
  Administered 2017-09-20 (×2): 1 mg via INTRAVENOUS

## 2017-09-20 MED ORDER — MEPERIDINE HCL 50 MG/ML IJ SOLN: 6.2500 mg | INTRAMUSCULAR | Status: DC | PRN

## 2017-09-20 MED ORDER — METOCLOPRAMIDE HCL 5 MG/ML IJ SOLN
10.0000 mg | Freq: Once | INTRAMUSCULAR | Status: AC | PRN
  Administered 2017-09-20: 10 mg via INTRAVENOUS

## 2017-09-20 MED ORDER — LACTATED RINGERS IV SOLN
INTRAVENOUS | Status: DC
  Administered 2017-09-20 (×2): via INTRAVENOUS

## 2017-09-20 MED ORDER — FENTANYL CITRATE (PF) 250 MCG/5ML IJ SOLN
INTRAMUSCULAR | Status: AC
  Filled 2017-09-20: qty 5

## 2017-09-20 MED ORDER — MIDAZOLAM HCL 2 MG/2ML IJ SOLN
INTRAMUSCULAR | Status: AC
  Administered 2017-09-20: 2 mg
  Filled 2017-09-20: qty 2

## 2017-09-20 MED ORDER — PHENOL 1.4 % MT LIQD: 1.0000 | OROMUCOSAL | Status: DC | PRN

## 2017-09-20 MED ORDER — DEXAMETHASONE SODIUM PHOSPHATE 10 MG/ML IJ SOLN
INTRAMUSCULAR | Status: DC | PRN
  Administered 2017-09-20: 10 mg via INTRAVENOUS

## 2017-09-20 MED ORDER — CEFAZOLIN SODIUM 1 G IJ SOLR
INTRAMUSCULAR | Status: AC
  Filled 2017-09-20: qty 20

## 2017-09-20 MED ORDER — MENTHOL 3 MG MT LOZG: 1.0000 | LOZENGE | OROMUCOSAL | Status: DC | PRN

## 2017-09-20 MED ORDER — MIDAZOLAM HCL 2 MG/2ML IJ SOLN
INTRAMUSCULAR | Status: AC
  Filled 2017-09-20: qty 2

## 2017-09-20 MED ORDER — METHOCARBAMOL 1000 MG/10ML IJ SOLN
500.0000 mg | Freq: Four times a day (QID) | INTRAMUSCULAR | Status: DC | PRN
  Filled 2017-09-20: qty 5

## 2017-09-20 MED ORDER — PHENYLEPHRINE HCL 10 MG/ML IJ SOLN
INTRAVENOUS | Status: DC | PRN
  Administered 2017-09-20: 25 ug/min via INTRAVENOUS

## 2017-09-20 MED ORDER — ACETAMINOPHEN 325 MG PO TABS: 650.0000 mg | ORAL_TABLET | ORAL | Status: DC | PRN

## 2017-09-20 MED ORDER — AMLODIPINE BESYLATE 10 MG PO TABS
10.0000 mg | ORAL_TABLET | Freq: Every day | ORAL | Status: DC
  Administered 2017-09-20 – 2017-09-23 (×4): 10 mg via ORAL
  Filled 2017-09-20 (×4): qty 1

## 2017-09-20 SURGICAL SUPPLY — 69 items
BANDAGE ACE 4X5 VEL STRL LF (GAUZE/BANDAGES/DRESSINGS) ×3 IMPLANT
BANDAGE ELASTIC 6 VELCRO ST LF (GAUZE/BANDAGES/DRESSINGS) ×3 IMPLANT
BANDAGE ESMARK 6X9 LF (GAUZE/BANDAGES/DRESSINGS) ×1 IMPLANT
BENZOIN TINCTURE PRP APPL 2/3 (GAUZE/BANDAGES/DRESSINGS) ×3 IMPLANT
BLADE SAGITTAL 25.0X1.19X90 (BLADE) ×2 IMPLANT
BLADE SAGITTAL 25.0X1.19X90MM (BLADE) ×1
BLADE SAW SGTL 13X75X1.27 (BLADE) ×3 IMPLANT
BNDG ELASTIC 6X10 VLCR STRL LF (GAUZE/BANDAGES/DRESSINGS) ×3 IMPLANT
BNDG ESMARK 6X9 LF (GAUZE/BANDAGES/DRESSINGS) ×3
BOWL SMART MIX CTS (DISPOSABLE) ×3 IMPLANT
CAPT KNEE TOTAL 3 ATTUNE ×3 IMPLANT
CEMENT HV SMART SET (Cement) ×6 IMPLANT
CLOSURE STERI-STRIP 1/2X4 (GAUZE/BANDAGES/DRESSINGS) ×1
CLOSURE WOUND 1/2 X4 (GAUZE/BANDAGES/DRESSINGS) ×2
CLSR STERI-STRIP ANTIMIC 1/2X4 (GAUZE/BANDAGES/DRESSINGS) ×2 IMPLANT
COVER SURGICAL LIGHT HANDLE (MISCELLANEOUS) ×3 IMPLANT
CUFF TOURNIQUET SINGLE 34IN LL (TOURNIQUET CUFF) ×3 IMPLANT
DRAPE ORTHO SPLIT 77X108 STRL (DRAPES) ×4
DRAPE SURG ORHT 6 SPLT 77X108 (DRAPES) ×2 IMPLANT
DRAPE U-SHAPE 47X51 STRL (DRAPES) ×3 IMPLANT
DRSG PAD ABDOMINAL 8X10 ST (GAUZE/BANDAGES/DRESSINGS) ×3 IMPLANT
DURAPREP 26ML APPLICATOR (WOUND CARE) ×6 IMPLANT
ELECT REM PT RETURN 9FT ADLT (ELECTROSURGICAL) ×3
ELECTRODE REM PT RTRN 9FT ADLT (ELECTROSURGICAL) ×1 IMPLANT
FACESHIELD WRAPAROUND (MASK) ×6 IMPLANT
GAUZE SPONGE 4X4 12PLY STRL (GAUZE/BANDAGES/DRESSINGS) ×3 IMPLANT
GAUZE SPONGE 4X4 12PLY STRL LF (GAUZE/BANDAGES/DRESSINGS) ×3 IMPLANT
GAUZE XEROFORM 5X9 LF (GAUZE/BANDAGES/DRESSINGS) ×3 IMPLANT
GLOVE BIOGEL PI IND STRL 8 (GLOVE) ×2 IMPLANT
GLOVE BIOGEL PI INDICATOR 8 (GLOVE) ×4
GLOVE ORTHO TXT STRL SZ7.5 (GLOVE) ×6 IMPLANT
GOWN STRL REUS W/ TWL LRG LVL3 (GOWN DISPOSABLE) ×1 IMPLANT
GOWN STRL REUS W/ TWL XL LVL3 (GOWN DISPOSABLE) ×1 IMPLANT
GOWN STRL REUS W/TWL 2XL LVL3 (GOWN DISPOSABLE) ×3 IMPLANT
GOWN STRL REUS W/TWL LRG LVL3 (GOWN DISPOSABLE) ×2
GOWN STRL REUS W/TWL XL LVL3 (GOWN DISPOSABLE) ×2
HANDPIECE INTERPULSE COAX TIP (DISPOSABLE) ×2
IMMOBILIZER KNEE 22 (SOFTGOODS) ×3 IMPLANT
IMMOBILIZER KNEE 22 UNIV (SOFTGOODS) ×3 IMPLANT
KIT BASIN OR (CUSTOM PROCEDURE TRAY) ×3 IMPLANT
KIT ROOM TURNOVER OR (KITS) ×3 IMPLANT
MANIFOLD NEPTUNE II (INSTRUMENTS) ×3 IMPLANT
MARKER SKIN DUAL TIP RULER LAB (MISCELLANEOUS) ×3 IMPLANT
NEEDLE 18GX1X1/2 (RX/OR ONLY) (NEEDLE) ×3 IMPLANT
NEEDLE HYPO 25GX1X1/2 BEV (NEEDLE) ×3 IMPLANT
NS IRRIG 1000ML POUR BTL (IV SOLUTION) ×3 IMPLANT
PACK TOTAL JOINT (CUSTOM PROCEDURE TRAY) ×3 IMPLANT
PAD ABD 8X10 STRL (GAUZE/BANDAGES/DRESSINGS) ×3 IMPLANT
PAD ARMBOARD 7.5X6 YLW CONV (MISCELLANEOUS) ×6 IMPLANT
PAD CAST 4YDX4 CTTN HI CHSV (CAST SUPPLIES) ×1 IMPLANT
PADDING CAST COTTON 4X4 STRL (CAST SUPPLIES) ×2
PADDING CAST COTTON 6X4 STRL (CAST SUPPLIES) ×3 IMPLANT
SET HNDPC FAN SPRY TIP SCT (DISPOSABLE) ×1 IMPLANT
STAPLER VISISTAT 35W (STAPLE) ×3 IMPLANT
STRIP CLOSURE SKIN 1/2X4 (GAUZE/BANDAGES/DRESSINGS) ×4 IMPLANT
SUCTION FRAZIER HANDLE 10FR (MISCELLANEOUS) ×2
SUCTION TUBE FRAZIER 10FR DISP (MISCELLANEOUS) ×1 IMPLANT
SUT VIC AB 0 CT1 27 (SUTURE) ×2
SUT VIC AB 0 CT1 27XBRD ANBCTR (SUTURE) ×1 IMPLANT
SUT VIC AB 1 CTX 36 (SUTURE) ×6
SUT VIC AB 1 CTX36XBRD ANBCTR (SUTURE) ×3 IMPLANT
SUT VIC AB 2-0 CT1 27 (SUTURE) ×4
SUT VIC AB 2-0 CT1 TAPERPNT 27 (SUTURE) ×2 IMPLANT
SUT VIC AB 3-0 X1 27 (SUTURE) IMPLANT
SYR 50ML LL SCALE MARK (SYRINGE) ×3 IMPLANT
SYR CONTROL 10ML LL (SYRINGE) ×3 IMPLANT
TOWEL OR 17X24 6PK STRL BLUE (TOWEL DISPOSABLE) ×3 IMPLANT
TOWEL OR 17X26 10 PK STRL BLUE (TOWEL DISPOSABLE) ×3 IMPLANT
TRAY CATH 16FR W/PLASTIC CATH (SET/KITS/TRAYS/PACK) ×3 IMPLANT

## 2017-09-20 NOTE — Anesthesia Procedure Notes (Signed)
Anesthesia Regional Block: Adductor canal block   Pre-Anesthetic Checklist: ,, timeout performed, Correct Patient, Correct Site, Correct Laterality, Correct Procedure, Correct Position, site marked, Risks and benefits discussed,  Surgical consent,  Pre-op evaluation,  At surgeon's request and post-op pain management  Laterality: Left and Lower  Prep: Maximum Sterile Barrier Precautions used, chloraprep       Needles:  Injection technique: Single-shot  Needle Type: Echogenic Stimulator Needle     Needle Length: 10cm      Additional Needles:   Procedures:,,,, ultrasound used (permanent image in chart),,,,  Narrative:  Start time: 09/20/2017 11:52 AM End time: 09/20/2017 12:07 PM Injection made incrementally with aspirations every 5 mL.  Performed by: Personally  Anesthesiologist: Montez Hageman, MD  Additional Notes: Risks, benefits and alternative to block explained extensively.  Patient tolerated procedure well, without complications.

## 2017-09-20 NOTE — Progress Notes (Signed)
Orthopedic Tech Progress Note Patient Details:  Claudia [name] 15-Oct-1955 [id number]  CPM Left Knee CPM Left Knee: On Left Knee Flexion (Degrees): 90 Left Knee Extension (Degrees): 0 Additional Comments: cpm applied to pt left knee/leg.  pt tolerated application very well.  Left knee/leg.  Post Interventions Patient Tolerated: Well  Kristopher Oppenheim 09/20/2017, 3:04 PM

## 2017-09-20 NOTE — Op Note (Signed)
Preop diagnosis: Left knee osteoarthritis, primary  Postop diagnosis: Same  Procedure: Left total knee arthroplasty.  Anesthesia: General  Tourniquet: 350 x 47 minutes  Surgeon: Rodell Perna MD  Assistant: Benjiman Core PA-C medically necessary and present for the entire procedure  Implants: #5 femur #6 tibia 5 mm rotating platform Attune, 41 mm patella    Procedure: After standard prepping and draping proximal thigh tourniquet a sure foot heel bump and lateral post leg was prepped with DuraPrep including the toes extremity sheets draped sterile skin marker impervious stockinette Coban and and Betadine Steri-Drape were applied.  Leg was wrapped with an Esmarch after timeout procedure tourniquet was inflated preoperative antibiotics were given.  Midline incision was made medial parapatellar incision.  10 mm resected off the patella from facet facet.  Intramedullary hole drilled in the femur.  There was tricompartmental degenerative arthritis present with marginal osteophytes subchondral cyst formation.  Intramedullary hole made in the femur 10 mm resected off the femur.  9-10 resected off the tibia 5 mm spacer block gave a nice fit.  Chamfer cuts were made on the femur keel sizing for the tibia showed a #6 tibia fit well.  Heel preparation removal of all meniscal remnants.  Lugs were drilled on the femur as well as the patella 22mm size was selected.  Pulse lavage vacuum mixing of the cement and then implantation of the tibia followed by femur placement of the permanent 5 mm poly-based on trial sizes before permanent one was inserted.  Patellar clamp held the patellar component.  All excessive cement was removed.  There were no posterior spurs that need to be removed with a curved osteotome.  Cement was hard at 15 minutes tourniquet deflated hemostasis obtained in standard layered closure.  Instrument count needle count was correct.  The patient tolerated the procedure well.

## 2017-09-20 NOTE — Anesthesia Postprocedure Evaluation (Signed)
Anesthesia Post Note  Patient: Claudia [name]  Procedure(s) Performed: LEFT TOTAL KNEE ARTHROPLASTY (Left Knee)     Patient location during evaluation: PACU Anesthesia Type: Spinal Level of consciousness: awake, awake and alert and oriented Pain management: pain level controlled Vital Signs Assessment: post-procedure vital signs reviewed and stable Respiratory status: spontaneous breathing, nonlabored ventilation and respiratory function stable Cardiovascular status: blood pressure returned to baseline Postop Assessment: spinal receding Anesthetic complications: no    Last Vitals:  Vitals:   09/20/17 1533 09/20/17 1601  BP: 108/76 132/64  Pulse: 71 73  Resp: 19 18  Temp: 36.7 C 36.4 C  SpO2: 93% 96%    Last Pain:  Vitals:   09/20/17 1601  TempSrc: Oral  PainSc:                  [name]

## 2017-09-20 NOTE — Transfer of Care (Signed)
Immediate Anesthesia Transfer of Care Note  Patient: Claudia [name]  Procedure(s) Performed: LEFT TOTAL KNEE ARTHROPLASTY (Left Knee)  Patient Location: PACU  Anesthesia Type:Spinal  Level of Consciousness: drowsy and patient cooperative  Airway & Oxygen Therapy: Patient Spontanous Breathing and Patient connected to face mask oxygen  Post-op Assessment: Report given to RN and Post -op Vital signs reviewed and stable  Post vital signs: Reviewed and stable  Last Vitals:  Vitals:   09/20/17 1054 09/20/17 1440  BP: 139/87 113/75  Pulse: 85 88  Resp: 20 (!) 26  Temp: 36.7 C 36.9 C  SpO2: 96% 98%    Last Pain:  Vitals:   09/20/17 1440  TempSrc:   PainSc: (P) Asleep         Complications: No apparent anesthesia complications

## 2017-09-20 NOTE — Anesthesia Procedure Notes (Signed)
Spinal  Patient location during procedure: OR Staffing Anesthesiologist: Montez Hageman, MD Performed: anesthesiologist  Preanesthetic Checklist Completed: patient identified, site marked, surgical consent, pre-op evaluation, timeout performed, IV checked, risks and benefits discussed and monitors and equipment checked Spinal Block Patient position: sitting Prep: Betadine Patient monitoring: heart rate, continuous pulse ox and blood pressure Approach: right paramedian Location: L3-4 Injection technique: single-shot Needle Needle type: Sprotte  Needle gauge: 24 G Needle length: 9 cm Additional Notes Expiration date of kit checked and confirmed. Patient tolerated procedure well, without complications.

## 2017-09-20 NOTE — Interval H&P Note (Signed)
History and Physical Interval Note:  09/20/2017 11:24 AM  Claudia [name]  has presented today for surgery, with the diagnosis of LEFT KNEE OSTEOARTHRITIS  The various methods of treatment have been discussed with the patient and family. After consideration of risks, benefits and other options for treatment, the patient has consented to  Procedure(s): LEFT TOTAL KNEE ARTHROPLASTY (Left) as a surgical intervention .  The patient's history has been reviewed, patient examined, no change in status, stable for surgery.  I have reviewed the patient's chart and labs.  Questions were answered to the patient's satisfaction.     Marybelle Killings

## 2017-09-20 NOTE — Anesthesia Preprocedure Evaluation (Signed)
Anesthesia Evaluation  Patient identified by MRN, date of birth, ID band Patient awake    Reviewed: Allergy & Precautions, NPO status , Patient's Chart, lab work & pertinent test results  History of Anesthesia Complications (+) PONV  Airway Mallampati: II  TM Distance: >3 FB Neck ROM: Full    Dental no notable dental hx.    Pulmonary Current Smoker,    Pulmonary exam normal breath sounds clear to auscultation       Cardiovascular hypertension, Pt. on medications Normal cardiovascular exam Rhythm:Regular Rate:Normal     Neuro/Psych negative neurological ROS  negative psych ROS   GI/Hepatic negative GI ROS, Neg liver ROS,   Endo/Other  negative endocrine ROS  Renal/GU negative Renal ROS  negative genitourinary   Musculoskeletal negative musculoskeletal ROS (+)   Abdominal   Peds negative pediatric ROS (+)  Hematology negative hematology ROS (+)   Anesthesia Other Findings   Reproductive/Obstetrics negative OB ROS                             Anesthesia Physical Anesthesia Plan  ASA: II  Anesthesia Plan: Spinal   Post-op Pain Management:  Regional for Post-op pain   Induction: Intravenous  PONV Risk Score and Plan:   Airway Management Planned: Simple Face Mask  Additional Equipment:   Intra-op Plan:   Post-operative Plan:   Informed Consent: I have reviewed the patients History and Physical, chart, labs and discussed the procedure including the risks, benefits and alternatives for the proposed anesthesia with the patient or authorized representative who has indicated his/her understanding and acceptance.   Dental advisory given  Plan Discussed with: CRNA  Anesthesia Plan Comments:         Anesthesia Quick Evaluation

## 2017-09-21 ENCOUNTER — Encounter (HOSPITAL_COMMUNITY): Payer: Self-pay | Admitting: Orthopaedic Surgery

## 2017-09-21 ENCOUNTER — Other Ambulatory Visit: Payer: Self-pay

## 2017-09-21 LAB — CBC
HCT: 32.3 % — ABNORMAL LOW (ref 36.0–46.0)
Hemoglobin: 10.5 g/dL — ABNORMAL LOW (ref 12.0–15.0)
MCH: 29.7 pg (ref 26.0–34.0)
MCHC: 32.5 g/dL (ref 30.0–36.0)
MCV: 91.5 fL (ref 78.0–100.0)
PLATELETS: 172 10*3/uL (ref 150–400)
RBC: 3.53 MIL/uL — AB (ref 3.87–5.11)
RDW: 14.2 % (ref 11.5–15.5)
WBC: 11.8 10*3/uL — ABNORMAL HIGH (ref 4.0–10.5)

## 2017-09-21 LAB — BASIC METABOLIC PANEL
Anion gap: 8 (ref 5–15)
BUN: 9 mg/dL (ref 6–20)
CHLORIDE: 107 mmol/L (ref 101–111)
CO2: 23 mmol/L (ref 22–32)
CREATININE: 0.58 mg/dL (ref 0.44–1.00)
Calcium: 8.2 mg/dL — ABNORMAL LOW (ref 8.9–10.3)
GFR calc Af Amer: >60 mL/min (ref >60)
GFR calc non Af Amer: >60 mL/min (ref >60)
GLUCOSE: 147 mg/dL — AB (ref 65–99)
POTASSIUM: 3.9 mmol/L (ref 3.5–5.1)
SODIUM: 138 mmol/L (ref 135–145)

## 2017-09-21 MED ORDER — MORPHINE SULFATE (PF) 2 MG/ML IV SOLN
2.0000 mg | INTRAVENOUS | Status: DC | PRN
  Administered 2017-09-21 – 2017-09-22 (×2): 2 mg via INTRAVENOUS
  Filled 2017-09-21 (×2): qty 1

## 2017-09-21 MED ORDER — OXYCODONE HCL 5 MG PO TABS
5.0000 mg | ORAL_TABLET | ORAL | Status: DC | PRN
  Administered 2017-09-21 – 2017-09-22 (×5): 10 mg via ORAL
  Filled 2017-09-21 (×5): qty 2

## 2017-09-21 NOTE — Progress Notes (Signed)
Physical Therapy Treatment Patient Details Name: Claudia [name] MRN: [id number] DOB: 12/25/1955 Today's Date: 09/21/2017    History of Present Illness Admitted for LTKA, WBAT, KI;  has a past medical history of Anxiety, Breast cancer ([place]), Cancer of right breast ([place]), Depression, GERD (gastroesophageal reflux disease), Hiatal hernia, Hypercholesterolemia, Hypertension, Irritable bowel, and PONV (postoperative nausea and vomiting). ORIF L ankle    PT Comments    Continuing work on functional mobility and activity tolerance;  Afternoon session significantly limited by pain and nausea; Even with that, Larin was able to work briefly on standing tolerance and L kne stability in stance without KI -- no buckling noted; I anticipate good progress when pain and nausea are controlled   Follow Up Recommendations  Follow surgeon's recommendation for DC plan and follow-up therapies;Supervision - Intermittent     Equipment Recommendations  Rolling walker with 5" wheels;3in1 (PT)    Recommendations for Other Services OT consult(ordered per protocol)     Precautions / Restrictions Precautions Precautions: Knee Precaution Booklet Issued: Yes (comment) Precaution Comments: Pt educated to not allow any pillow or bolster under knee for healing with optimal range of motion.  Required Braces or Orthoses: Knee Immobilizer - Left Knee Immobilizer - Left: On when out of bed or walking Restrictions LLE Weight Bearing: Weight bearing as tolerated    Mobility  Bed Mobility Overal bed mobility: Needs Assistance Bed Mobility: Supine to Sit;Sit to Supine     Supine to sit: Mod assist Sit to supine: Mod assist   General bed mobility comments: Mod assist to support LLE during transitions; very painful  Transfers Overall transfer level: Needs assistance Equipment used: Rolling walker (2 wheeled) Transfers: Sit to/from Stand Sit to Stand: Min guard         General transfer comment: Cues for  hand placement and safety; good rise  Ambulation/Gait Ambulation/Gait assistance: Min guard Ambulation Distance (Feet): (sidesteps to [facility]) Assistive device: Rolling walker (2 wheeled) Gait Pattern/deviations: Shuffle     General Gait Details: cues for sequence   Stairs            Wheelchair Mobility    Modified Rankin (Stroke Patients Only)       Balance                                            Cognition Arousal/Alertness: Awake/alert Behavior During Therapy: WFL for tasks assessed/performed Overall Cognitive Status: Within Functional Limits for tasks assessed                                        Exercises      General Comments General comments (skin integrity, edema, etc.): Stood at EOB with RW and worked briefly on L single limb stance stability without external support of KI; Painful, but stable in stance with no buckling noted      Pertinent Vitals/Pain Pain Assessment: 0-10 Pain Score: 10-Worst pain ever Pain Location: L knee Pain Descriptors / Indicators: Aching Pain Intervention(s): RN gave pain meds during session    Home Living Family/patient expects to be discharged to:: Private residence Living Arrangements: Other relatives;Children                  Prior Function  PT Goals (current goals can now be found in the care plan section) Acute Rehab PT Goals Patient Stated Goal: beach trip PT Goal Formulation: With patient/family Time For Goal Achievement: 09/28/17 Potential to Achieve Goals: Good Progress towards PT goals: Not progressing toward goals - comment(limited by pain and nausea)    Frequency    7X/week      PT Plan Current plan remains appropriate    Co-evaluation              AM-PAC PT "6 Clicks" Daily Activity  Outcome Measure  Difficulty turning over in bed (including adjusting bedclothes, sheets and blankets)?: A Lot Difficulty moving from lying on back to  sitting on the side of the bed? : Unable Difficulty sitting down on and standing up from a chair with arms (e.g., wheelchair, bedside commode, etc,.)?: Unable Help needed moving to and from a bed to chair (including a wheelchair)?: A Lot Help needed walking in hospital room?: A Lot Help needed climbing 3-5 steps with a railing? : Total 6 Click Score: 9    End of Session Equipment Utilized During Treatment: Gait belt Activity Tolerance: Patient limited by pain Patient left: in bed;with call bell/phone within reach;with nursing/sitter in room Nurse Communication: Mobility status;Patient requests pain meds PT Visit Diagnosis: Other abnormalities of gait and mobility (R26.89);Pain Pain - Right/Left: Left Pain - part of body: Knee     Time: 1401-1420 PT Time Calculation (min) (ACUTE ONLY): 19 min  Charges:  $Therapeutic Activity: 8-22 mins                    G Codes:       Roney Marion, PT  Acute Rehabilitation Services Pager [phone] Office [phone]    Colletta [place] 09/21/2017, 3:50 PM

## 2017-09-21 NOTE — Care Management Note (Signed)
Case Management Note  Patient Details  Name: CARLINE DURA MRN: [id number] Date of Birth: 1956-04-28  Subjective/Objective:   62 yr old female s/p left total knee arthroplasty.                 Action/Plan: Case manager spoke with patient concerning discharge plan and DME. Choice for [facility] was offered, patient said she has used [facility] in the past and is interested in doing so now. Referral was called to Neoma Laming, [facility] Liaison. Patient says her sister will be with her at discharge.    Expected Discharge Date:   09/22/17               Expected Discharge Plan:  [place]  In-House Referral:  NA  Discharge planning Services  CM Consult  Post Acute Care Choice:  Durable Medical Equipment, Home Health Choice offered to:  Patient  DME Arranged:  3-N-1, Walker rolling DME Agency:  [facility]:  PT [facility] Agency:  [facility]  Status of Service:  Completed, signed off  If discussed at [place] of Stay Meetings, dates discussed:    Additional Comments:  Ninfa Meeker, RN 09/21/2017, 11:33 AM

## 2017-09-21 NOTE — Progress Notes (Signed)
Patient called nurse to room complaining of pain 7/10. Patient informed she is not due for pain medication until  0713 am. Patient also informed nurse will call MD to order her pain medication. Patient informed nurse not to call MD for another dose of pain medication and she prefers to wait until MD see her this morning

## 2017-09-21 NOTE — Evaluation (Signed)
Physical Therapy Evaluation Patient Details Name: Claudia [name] MRN: [id number] DOB: Sep 17, 1955 Today's Date: 09/21/2017   History of Present Illness  Admitted for LTKA, WBAT, KI;  has a past medical history of Anxiety, Breast cancer ([place]), Cancer of right breast ([place]), Depression, GERD (gastroesophageal reflux disease), Hiatal hernia, Hypercholesterolemia, Hypertension, Irritable bowel, and PONV (postoperative nausea and vomiting). ORIF L ankle  Clinical Impression   Pt is s/p TKA resulting in the deficits listed below (see PT Problem List). Presents with knee pain limiting mobility, but very motivated to get better; will have intermittent assist at home (sister is planning to stay with her, but pt reports her sister is unreliable); Will aim PT goals at pt being independent and managing for hours at a time independently; Placed OT [place] for ADLs;  Pt will benefit from skilled PT to increase their independence and safety with mobility to allow discharge to the venue listed below.      Follow Up Recommendations Follow surgeon's recommendation for DC plan and follow-up therapies;Supervision - Intermittent    Equipment Recommendations  Rolling walker with 5" wheels;3in1 (PT)    Recommendations for Other Services OT consult(ordered per protocol)     Precautions / Restrictions Precautions Precautions: Knee Precaution Booklet Issued: Yes (comment) Precaution Comments: Pt educated to not allow any pillow or bolster under knee for healing with optimal range of motion.  Required Braces or Orthoses: Knee Immobilizer - Left Knee Immobilizer - Left: On when out of bed or walking Restrictions Weight Bearing Restrictions: No LLE Weight Bearing: Weight bearing as tolerated      Mobility  Bed Mobility Overal bed mobility: Needs Assistance Bed Mobility: Supine to Sit     Supine to sit: Min assist     General bed mobility comments: Cues for technqiue; min assist to support LE coming off  of bed  Transfers Overall transfer level: Needs assistance Equipment used: Rolling walker (2 wheeled) Transfers: Sit to/from Stand Sit to Stand: Min guard         General transfer comment: Cues for hand placement and safety; good rise  Ambulation/Gait Ambulation/Gait assistance: Min guard(with and without physical contact) Ambulation Distance (Feet): 80 Feet Assistive device: Rolling walker (2 wheeled) Gait Pattern/deviations: Step-through pattern(emerging)     General Gait Details: Cues for gait sequence and to activate L quad for stance stability; no gross buckling or L knee stance instability noted  Stairs            Wheelchair Mobility    Modified Rankin (Stroke Patients Only)       Balance                                             Pertinent Vitals/Pain Pain Assessment: 0-10 Pain Score: 5  Pain Location: L knee Pain Descriptors / Indicators: Aching Pain Intervention(s): Monitored during session    Home Living Family/patient expects to be discharged to:: Private residence Living Arrangements: Children(In high school) Available Help at Discharge: Family;Friend(s);Available PRN/intermittently(Sister can help, but she is unreliable per pt) Type of Home: House Home Access: Stairs to enter Entrance Stairs-Rails: Psychiatric nurse of Steps: 4 Home Layout: One level        Prior Function Level of Independence: Independent               Hand Dominance        Extremity/Trunk Assessment  Upper Extremity Assessment Upper Extremity Assessment: Overall WFL for tasks assessed    Lower Extremity Assessment Lower Extremity Assessment: LLE deficits/detail LLE Deficits / Details: Grossly decr AROM and strength, limited by pain postop; AAROM approx 0-82deg       Communication   Communication: No difficulties  Cognition Arousal/Alertness: Awake/alert Behavior During Therapy: WFL for tasks  assessed/performed Overall Cognitive Status: Within Functional Limits for tasks assessed                                        General Comments      Exercises Total Joint Exercises Ankle Circles/Pumps: AROM;Both;20 reps Quad Sets: AROM;Left;10 reps Short Arc QuadSinclair Ship;Left;10 reps Heel Slides: AAROM;Left;10 reps Straight Leg Raises: AAROM;Left;10 reps   Assessment/Plan    PT Assessment Patient needs continued PT services  PT Problem List Decreased strength;Decreased range of motion;Decreased activity tolerance;Decreased balance;Decreased knowledge of use of DME;Decreased knowledge of precautions;Pain       PT Treatment Interventions DME instruction;Gait training;Stair training;Functional mobility training;Therapeutic activities;Therapeutic exercise;Balance training;Patient/family education    PT Goals (Current goals can be found in the Care Plan section)  Acute Rehab PT Goals Patient Stated Goal: beach trip PT Goal Formulation: With patient/family Time For Goal Achievement: 09/28/17 Potential to Achieve Goals: Good    Frequency 7X/week   Barriers to discharge   Pt reports her sister is unreliable for help at home; have aimed PT goals at being modified independent    Co-evaluation               AM-PAC PT "6 Clicks" Daily Activity  Outcome Measure Difficulty turning over in bed (including adjusting bedclothes, sheets and blankets)?: A Lot Difficulty moving from lying on back to sitting on the side of the bed? : A Lot Difficulty sitting down on and standing up from a chair with arms (e.g., wheelchair, bedside commode, etc,.)?: A Little Help needed moving to and from a bed to chair (including a wheelchair)?: A Little Help needed walking in hospital room?: A Little Help needed climbing 3-5 steps with a railing? : A Lot 6 Click Score: 15    End of Session Equipment Utilized During Treatment: Gait belt Activity Tolerance: Patient tolerated  treatment well Patient left: in chair;with call bell/phone within reach Nurse Communication: Mobility status PT Visit Diagnosis: Other abnormalities of gait and mobility (R26.89);Pain Pain - Right/Left: Left Pain - part of body: Knee    Time: 0930-1011 PT Time Calculation (min) (ACUTE ONLY): 41 min   Charges:   PT Evaluation $PT Eval Moderate Complexity: 1 Mod PT Treatments $Gait Training: 8-22 mins $Therapeutic Exercise: 8-22 mins   PT G Codes:        Roney Marion, PT  Acute Rehabilitation Services Pager [phone] Office [phone]   Colletta [place] 09/21/2017, 11:38 AM

## 2017-09-21 NOTE — Progress Notes (Signed)
   Subjective: 1 Day Post-Op Procedure(s) (LRB): LEFT TOTAL KNEE ARTHROPLASTY (Left) Patient reports pain as moderate and severe.  On CPM with flexion to 90.   Objective: Vital signs in last 24 hours: Temp:  [97.6 F (36.4 C)-98.5 F (36.9 C)] 97.9 F (36.6 C) (02/26 0330) Pulse Rate:  [67-89] 86 (02/26 0330) Resp:  [16-26] 16 (02/26 0330) BP: (104-139)/(64-87) 108/66 (02/26 0330) SpO2:  [93 %-98 %] 98 % (02/26 0330) Weight:  [216 lb (98 kg)] 216 lb (98 kg) (02/25 1054)  Intake/Output from previous day: 02/25 0701 - 02/26 0700 In: 2004.3 [P.O.:240; I.V.:1764.3] Out: 630 [Urine:600; Blood:30] Intake/Output this shift: No intake/output data recorded.  Recent Labs    09/21/17 0559  HGB 10.5*   Recent Labs    09/21/17 0559  WBC 11.8*  RBC 3.53*  HCT 32.3*  PLT 172   No results for input(s): NA, K, CL, CO2, BUN, CREATININE, GLUCOSE, CALCIUM in the last 72 hours. No results for input(s): LABPT, INR in the last 72 hours.  Neurologically intact Dg Knee 1-2 Views Left  Result Date: 09/20/2017 CLINICAL DATA:  Post OP left knee replacement EXAM: LEFT KNEE - 1-2 VIEW COMPARISON:  None. FINDINGS: Osseous alignment is anatomic. Arthroplasty hardware appears intact and appropriately positioned. Expected postsurgical changes within the overlying soft tissues. IMPRESSION: Left knee arthroplasty hardware appears intact and appropriately positioned. No evidence of surgical complicating feature. Electronically Signed   By: Franki Cabot M.D.   On: 09/20/2017 14:54    Assessment/Plan: 1 Day Post-Op Procedure(s) (LRB): LEFT TOTAL KNEE ARTHROPLASTY (Left) Up with therapy. Dressing change   Marybelle Killings 09/21/2017, 7:34 AM

## 2017-09-21 NOTE — Social Work (Signed)
CSW met with patient at bedside. CSW faxed letter for patient to court.  CSW signing off at this time.  Please advise if a new need arises.  Elissa Hefty, LCSW Clinical Social Worker [phone]

## 2017-09-22 LAB — CBC
HCT: 32.1 % — ABNORMAL LOW (ref 36.0–46.0)
Hemoglobin: 10.6 g/dL — ABNORMAL LOW (ref 12.0–15.0)
MCH: 30.1 pg (ref 26.0–34.0)
MCHC: 33 g/dL (ref 30.0–36.0)
MCV: 91.2 fL (ref 78.0–100.0)
PLATELETS: 170 10*3/uL (ref 150–400)
RBC: 3.52 MIL/uL — ABNORMAL LOW (ref 3.87–5.11)
RDW: 14.4 % (ref 11.5–15.5)
WBC: 9 10*3/uL (ref 4.0–10.5)

## 2017-09-22 MED ORDER — OXYCODONE HCL 5 MG PO TABS
10.0000 mg | ORAL_TABLET | ORAL | Status: DC | PRN
  Administered 2017-09-22 – 2017-09-23 (×4): 15 mg via ORAL
  Filled 2017-09-22 (×4): qty 3

## 2017-09-22 NOTE — Progress Notes (Signed)
Physical Therapy Treatment Patient Details Name: Claudia [name] MRN: [id number] DOB: 06/27/1956 Today's Date: 09/22/2017    History of Present Illness Admitted for LTKA, WBAT, KI;  has a past medical history of Anxiety, Breast cancer ([place]), Cancer of right breast ([place]), Depression, GERD (gastroesophageal reflux disease), Hiatal hernia, Hypercholesterolemia, Hypertension, Irritable bowel, and PONV (postoperative nausea and vomiting). ORIF L ankle    PT Comments    This session focused on LE therex and HEP handout reviewed. Pt just returned to bed with assist of OT. Pt needs to progress gait next session. Continue to progress as tolerated.    Follow Up Recommendations  Follow surgeon's recommendation for DC plan and follow-up therapies;Supervision - Intermittent     Equipment Recommendations  Rolling walker with 5" wheels;3in1 (PT)    Recommendations for Other Services OT consult(ordered per protocol)     Precautions / Restrictions Precautions Precautions: Knee Precaution Comments: knee precautions reviewed with pt Required Braces or Orthoses: Knee Immobilizer - Left Knee Immobilizer - Left: On when out of bed or walking Restrictions Weight Bearing Restrictions: Yes LLE Weight Bearing: Weight bearing as tolerated    Mobility  Bed Mobility Overal bed mobility: Needs Assistance Bed Mobility: Sit to Supine     Supine to sit: Min assist;HOB elevated Sit to supine: Mod assist   General bed mobility comments: requires (A) to elevate leg and complete transfer.   Transfers Overall transfer level: Needs assistance Equipment used: Rolling walker (2 wheeled) Transfers: Sit to/from Stand Sit to Stand: Mod assist         General transfer comment: needs (A) to power up from chair and anterior weight shift  Ambulation/Gait Ambulation/Gait assistance: Min assist Ambulation Distance (Feet): 6 Feet Assistive device: Rolling walker (2 wheeled) Gait Pattern/deviations: Step-to  pattern;Decreased stance time - left;Decreased step length - right;Decreased weight shift to left;Antalgic     General Gait Details: cues for sequencing and safe proximity to RW; pt limited by c/o lightheadedness   Stairs            Wheelchair Mobility    Modified Rankin (Stroke Patients Only)       Balance                                            Cognition Arousal/Alertness: Awake/alert Behavior During Therapy: WFL for tasks assessed/performed Overall Cognitive Status: Within Functional Limits for tasks assessed                                 General Comments: pt tearful on arrival and upset. pt reports having a disagreement with granddaughter. Granddaughter calling during session and all is now well at the end of the session with patietn smiling again      Exercises Total Joint Exercises Ankle Circles/Pumps: AROM;Both;20 reps Quad Sets: AROM;Left;10 reps Short Arc QuadSinclair Ship;Left;10 reps Heel Slides: AAROM;Left;10 reps Hip ABduction/ADduction: AROM;Left;10 reps Straight Leg Raises: AAROM;Left;5 reps    General Comments General comments (skin integrity, edema, etc.): BP 127/78 and SpO2 89% on RA       Pertinent Vitals/Pain Pain Assessment: 0-10 Pain Score: 7  Faces Pain Scale: Hurts even more Pain Location: L knee  Pain Descriptors / Indicators: Grimacing;Guarding;Sharp;Burning Pain Intervention(s): Limited activity within patient's tolerance;Monitored during session;Repositioned;Patient requesting pain meds-RN notified;Ice applied    Home Living  Family/patient expects to be discharged to:: Private residence Living Arrangements: Other relatives;Children Available Help at Discharge: Family;Friend(s);Available PRN/intermittently Type of Home: House Home Access: Stairs to enter Entrance Stairs-Rails: Right;Left Home Layout: One level Home Equipment: Bedside commode Additional Comments: pt reports that 25 yo granddaughter  lives in house, son lives in the house, a sister that is reliable lives 30 minutes away and other sister is not reliable but lives closer as caregivers. pt reports taht she sleeps on the couch after giving bedroom to granddaughter to let her have her own space.    Prior Function Level of Independence: Independent      Comments: works full time at [employer] (current goals can now be found in the care plan section) Acute Rehab PT Goals Patient Stated Goal: beach trip PT Goal Formulation: With patient/family Time For Goal Achievement: 09/28/17 Potential to Achieve Goals: Good Progress towards PT goals: Progressing toward goals    Frequency    7X/week      PT Plan Current plan remains appropriate    Co-evaluation              AM-PAC PT "6 Clicks" Daily Activity  Outcome Measure  Difficulty turning over in bed (including adjusting bedclothes, sheets and blankets)?: A Lot Difficulty moving from lying on back to sitting on the side of the bed? : Unable Difficulty sitting down on and standing up from a chair with arms (e.g., wheelchair, bedside commode, etc,.)?: Unable Help needed moving to and from a bed to chair (including a wheelchair)?: A Little Help needed walking in hospital room?: A Little Help needed climbing 3-5 steps with a railing? : Total 6 Click Score: 11    End of Session Equipment Utilized During Treatment: Gait belt Activity Tolerance: Patient limited by pain Patient left: with call bell/phone within reach;in bed Nurse Communication: Mobility status;Patient requests pain meds PT Visit Diagnosis: Other abnormalities of gait and mobility (R26.89);Pain Pain - Right/Left: Left Pain - part of body: Knee     Time: 0539-7673 PT Time Calculation (min) (ACUTE ONLY): 23 min  Charges:   $Therapeutic Exercise: 23-37 mins                    G Codes:       Earney Navy, PTA Pager: [phone]     Darliss Cheney 09/22/2017, 3:54 PM

## 2017-09-22 NOTE — Progress Notes (Signed)
Patient ID: Claudia [name], female   DOB: May 17, 1956, 62 y.o.   MRN: [id number] Progressing with PT. Plan discharge Thursday after she walks in halls and does stairs.

## 2017-09-22 NOTE — Progress Notes (Signed)
   Subjective: 2 Days Post-Op Procedure(s) (LRB): LEFT TOTAL KNEE ARTHROPLASTY (Left) Patient reports pain as moderate and severe.    Objective: Vital signs in last 24 hours: Temp:  [97.6 F (36.4 C)-98.4 F (36.9 C)] 97.9 F (36.6 C) (02/27 0544) Pulse Rate:  [85-100] 100 (02/27 0544) Resp:  [16-17] 17 (02/27 0544) BP: (116-134)/(65-73) 116/72 (02/27 0544) SpO2:  [94 %-100 %] 100 % (02/27 0544)  Intake/Output from previous day: 02/26 0701 - 02/27 0700 In: 720 [P.O.:720] Out: 300 [Urine:300] Intake/Output this shift: No intake/output data recorded.  Recent Labs    09/21/17 0559  HGB 10.5*   Recent Labs    09/21/17 0559  WBC 11.8*  RBC 3.53*  HCT 32.3*  PLT 172   Recent Labs    09/21/17 0559  NA 138  K 3.9  CL 107  CO2 23  BUN 9  CREATININE 0.58  GLUCOSE 147*  CALCIUM 8.2*   No results for input(s): LABPT, INR in the last 72 hours.  Neurologically intact No results found.  Assessment/Plan: 2 Days Post-Op Procedure(s) (LRB): LEFT TOTAL KNEE ARTHROPLASTY (Left) Up with therapy  Claudia [name] 09/22/2017, 7:45 AM

## 2017-09-22 NOTE — Evaluation (Signed)
Occupational Therapy Evaluation Patient Details Name: Claudia [name] MRN: [id number] DOB: 1956-07-18 Today's Date: 09/22/2017    History of Present Illness Admitted for LTKA, WBAT, KI;  has a past medical history of Anxiety, Breast cancer ([place]), Cancer of right breast ([place]), Depression, GERD (gastroesophageal reflux disease), Hiatal hernia, Hypercholesterolemia, Hypertension, Irritable bowel, and PONV (postoperative nausea and vomiting). ORIF L ankle   Clinical Impression   Patient is s/p L TKA surgery resulting in functional limitations due to the deficits listed below (see OT problem list). Pt currently requires (A) for LB bathing/ dressing and (A) for all bed mobility. Pt plans to purchase a reacher and OT to help practice reacher next session.  Patient will benefit from skilled OT acutely to increase independence and safety with ADLS to allow discharge [place]. .     Follow Up Recommendations  Home health OT    Equipment Recommendations  None recommended by OT    Recommendations for Other Services       Precautions / Restrictions Precautions Precautions: Knee Precaution Comments: knee precautions reviewed with pt Required Braces or Orthoses: Knee Immobilizer - Left Knee Immobilizer - Left: On when out of bed or walking Restrictions Weight Bearing Restrictions: Yes LLE Weight Bearing: Weight bearing as tolerated      Mobility Bed Mobility Overal bed mobility: Needs Assistance Bed Mobility: Sit to Supine     Supine to sit: Min assist;HOB elevated Sit to supine: Mod assist   General bed mobility comments: requires (A) to elevate leg and complete transfer.   Transfers Overall transfer level: Needs assistance Equipment used: Rolling walker (2 wheeled) Transfers: Sit to/from Stand Sit to Stand: Mod assist         General transfer comment: needs (A) to power up from chair and anterior weight shift    Balance                                            ADL either performed or assessed with clinical judgement   ADL Overall ADL's : Needs assistance/impaired Eating/Feeding: Set up   Grooming: Set up   Upper Body Bathing: Set up   Lower Body Bathing: Maximal assistance Lower Body Bathing Details (indicate cue type and reason): will need AE     Lower Body Dressing: Maximal assistance Lower Body Dressing Details (indicate cue type and reason): tech present and reports need for (A). pt can naturally reach knees Toilet Transfer: Minimal assistance         Tub/Shower Transfer Details (indicate cue type and reason): plans to sponge bath and does not wantto practice the tub    General ADL Comments: pt completed oob to bed transfer during session  Educated patient on knee full extension with return demonstration, never to wash directly on incision site, always use fresh clean linen (one time use then place in laundry), avoid water under bandage and benefits of wrapping dressing, sleeping positioning, avoid putting pillow under knee, educated on use of a belt or sheet as a leg lifter    Vision Baseline Vision/History: Wears glasses Wears Glasses: Reading only       Perception     Praxis      Pertinent Vitals/Pain Pain Assessment: Faces Faces Pain Scale: Hurts even more Pain Location: L knee Pain Descriptors / Indicators: Grimacing;Operative site guarding Pain Intervention(s): Monitored during session;Premedicated before session;Repositioned     Hand  Dominance Right   Extremity/Trunk Assessment Upper Extremity Assessment Upper Extremity Assessment: Overall WFL for tasks assessed   Lower Extremity Assessment Lower Extremity Assessment: Defer to PT evaluation   Cervical / Trunk Assessment Cervical / Trunk Assessment: Kyphotic   Communication Communication Communication: No difficulties   Cognition Arousal/Alertness: Awake/alert Behavior During Therapy: WFL for tasks assessed/performed Overall Cognitive Status: Within  Functional Limits for tasks assessed                                 General Comments: pt tearful on arrival and upset. pt reports having a disagreement with granddaughter. Granddaughter calling during session and all is now well at the end of the session with patietn smiling again   General Comments  BP 127/78 and SpO2 89% on RA     Exercises Total Joint Exercises Ankle Circles/Pumps: AROM;Both;20 reps   Shoulder Instructions      Home Living Family/patient expects to be discharged to:: Private residence Living Arrangements: Other relatives;Children Available Help at Discharge: Family;Friend(s);Available PRN/intermittently Type of Home: House Home Access: Stairs to enter [employer] of Steps: 4 Entrance Stairs-Rails: Right;Left Home Layout: One level         Biochemist, clinical: Standard     Home Equipment: Bedside commode   Additional Comments: pt reports that 75 yo granddaughter lives in house, son lives in the house, a sister that is reliable lives 30 minutes away and other sister is not reliable but lives closer as caregivers. pt reports taht she sleeps on the couch after giving bedroom to granddaughter to let her have her own space.      Prior Functioning/Environment Level of Independence: Independent        Comments: works full time at [employer] List: Decreased strength;Decreased activity tolerance;Impaired balance (sitting and/or standing);Decreased safety awareness;Decreased knowledge of use of DME or AE;Decreased knowledge of precautions;Obesity;Pain;Increased edema      OT Treatment/Interventions: Self-care/ADL training;Therapeutic exercise;DME and/or AE instruction;Therapeutic activities;Patient/family education;Balance training    OT Goals(Current goals can be found in the care plan section) Acute Rehab OT Goals Patient Stated Goal: beach trip OT Goal Formulation: With patient Time For Goal Achievement:  10/06/17 Potential to Achieve Goals: Good  OT Frequency: Min 2X/week   Barriers to D/C:            Co-evaluation              AM-PAC PT "6 Clicks" Daily Activity     Outcome Measure Help from another person eating meals?: None Help from another person taking care of personal grooming?: A Little Help from another person toileting, which includes using toliet, bedpan, or urinal?: A Little Help from another person bathing (including washing, rinsing, drying)?: A Little Help from another person to put on and taking off regular upper body clothing?: A Little Help from another person to put on and taking off regular lower body clothing?: A Little 6 Click Score: 19   End of Session Equipment Utilized During Treatment: Gait belt CPM Left Knee CPM Left Knee: Off Nurse Communication: Mobility status;Precautions  Activity Tolerance: Patient tolerated treatment well Patient left: in bed;with call bell/phone within reach  OT Visit Diagnosis: Unsteadiness on feet (R26.81);Muscle weakness (generalized) (M62.81)                Time: 4098-1191 OT Time Calculation (min): 36 min Charges:  OT General Charges $OT Visit:  1 Visit OT Evaluation $OT Eval Moderate Complexity: 1 Mod OT Treatments $Self Care/Home Management : 8-22 mins G-Codes:      Jeri Modena   OTR/L Pager: [phone] Office: [phone] .   Parke Poisson B 09/22/2017, 3:35 PM

## 2017-09-23 LAB — CBC
HEMATOCRIT: 32.4 % — AB (ref 36.0–46.0)
HEMOGLOBIN: 10.4 g/dL — AB (ref 12.0–15.0)
MCH: 29.4 pg (ref 26.0–34.0)
MCHC: 32.1 g/dL (ref 30.0–36.0)
MCV: 91.5 fL (ref 78.0–100.0)
Platelets: 184 10*3/uL (ref 150–400)
RBC: 3.54 MIL/uL — ABNORMAL LOW (ref 3.87–5.11)
RDW: 14.5 % (ref 11.5–15.5)
WBC: 9.6 10*3/uL (ref 4.0–10.5)

## 2017-09-23 MED ORDER — OXYCODONE-ACETAMINOPHEN 5-325 MG PO TABS
1.0000 | ORAL_TABLET | ORAL | Status: DC | PRN
  Administered 2017-09-23: 2 via ORAL
  Filled 2017-09-23: qty 2

## 2017-09-23 MED ORDER — OXYCODONE-ACETAMINOPHEN 5-325 MG PO TABS: 1.0000 | ORAL_TABLET | ORAL | 0 refills | Status: DC | PRN

## 2017-09-23 MED ORDER — METHOCARBAMOL 500 MG PO TABS: 500.0000 mg | ORAL_TABLET | Freq: Four times a day (QID) | ORAL | 0 refills | Status: DC | PRN

## 2017-09-23 NOTE — Progress Notes (Signed)
Physical Therapy Treatment Patient Details Name: Claudia [name] MRN: [id number] DOB: May 11, 1956 Today's Date: 09/23/2017    History of Present Illness Admitted for LTKA, WBAT, KI;  has a past medical history of Anxiety, Breast cancer ([place]), Cancer of right breast ([place]), Depression, GERD (gastroesophageal reflux disease), Hiatal hernia, Hypercholesterolemia, Hypertension, Irritable bowel, and PONV (postoperative nausea and vomiting). ORIF L ankle    PT Comments    Patient demonstrated increased endurance and tolerated gait and stair training well. Pt encouraged to do HEP this evening and to ambulate every hour when home. Continue to progress as tolerated.   Follow Up Recommendations  Follow surgeon's recommendation for DC plan and follow-up therapies;Supervision - Intermittent     Equipment Recommendations  Rolling walker with 5" wheels;3in1 (PT)    Recommendations for Other Services OT consult(ordered per protocol)     Precautions / Restrictions Precautions Precautions: Knee Precaution Comments: knee precautions reviewed with pt Required Braces or Orthoses: Knee Immobilizer - Left Knee Immobilizer - Left: On when out of bed or walking Restrictions Weight Bearing Restrictions: Yes LLE Weight Bearing: Weight bearing as tolerated    Mobility  Bed Mobility Overal bed mobility: Needs Assistance Bed Mobility: Supine to Sit     Supine to sit: Min guard     General bed mobility comments: increased time and effort  Transfers Overall transfer level: Needs assistance Equipment used: Rolling walker (2 wheeled) Transfers: Sit to/from Stand Sit to Stand: Min guard         General transfer comment: min guard for safety from EOB and recliner  Ambulation/Gait Ambulation/Gait assistance: Min guard;Supervision Ambulation Distance (Feet): 120 Feet Assistive device: Rolling walker (2 wheeled) Gait Pattern/deviations: Step-to pattern;Decreased stance time - left;Decreased step  length - right;Decreased weight shift to left;Antalgic Gait velocity: decreased   General Gait Details: cues for posture and step length symmetry   Stairs Stairs: Yes   Stair Management: One rail Left;Step to pattern;Sideways Number of Stairs: 5 General stair comments: cues for sequencing and technique; min guard for safety  Wheelchair Mobility    Modified Rankin (Stroke Patients Only)       Balance Overall balance assessment: Needs assistance Sitting-balance support: Feet supported;Bilateral upper extremity supported Sitting balance-Leahy Scale: Fair     Standing balance support: Bilateral upper extremity supported;During functional activity Standing balance-Leahy Scale: Poor                              Cognition Arousal/Alertness: Awake/alert Behavior During Therapy: WFL for tasks assessed/performed Overall Cognitive Status: Within Functional Limits for tasks assessed                                 General Comments: pt continues to report feeling groggy      Exercises      General Comments        Pertinent Vitals/Pain Pain Assessment: Faces Faces Pain Scale: Hurts little more Pain Location: L knee  Pain Descriptors / Indicators: Guarding;Sore Pain Intervention(s): Limited activity within patient's tolerance;Monitored during session;Repositioned    Home Living                      Prior Function            PT Goals (current goals can now be found in the care plan section) Acute Rehab PT Goals PT Goal Formulation: With patient/family Time  For Goal Achievement: 09/28/17 Potential to Achieve Goals: Good Progress towards PT goals: Progressing toward goals    Frequency    7X/week      PT Plan Current plan remains appropriate    Co-evaluation              AM-PAC PT "6 Clicks" Daily Activity  Outcome Measure  Difficulty turning over in bed (including adjusting bedclothes, sheets and blankets)?: A  Little Difficulty moving from lying on back to sitting on the side of the bed? : A Lot Difficulty sitting down on and standing up from a chair with arms (e.g., wheelchair, bedside commode, etc,.)?: Unable Help needed moving to and from a bed to chair (including a wheelchair)?: A Little Help needed walking in hospital room?: A Little Help needed climbing 3-5 steps with a railing? : A Little 6 Click Score: 15    End of Session Equipment Utilized During Treatment: Gait belt;Left knee immobilizer Activity Tolerance: Patient tolerated treatment well Patient left: with call bell/phone within reach;in bed;with family/visitor present Nurse Communication: Mobility status PT Visit Diagnosis: Other abnormalities of gait and mobility (R26.89);Pain Pain - Right/Left: Left Pain - part of body: Knee     Time: 1735-6701 PT Time Calculation (min) (ACUTE ONLY): 32 min  Charges:  $Gait Training: 23-37 mins                     G Codes:       Earney Navy, PTA Pager: [phone]     Darliss Cheney 09/23/2017, 3:45 PM

## 2017-09-23 NOTE — Progress Notes (Signed)
Physical Therapy Treatment Patient Details Name: Claudia [name] MRN: [id number] DOB: 06/09/1956 Today's Date: 09/23/2017    History of Present Illness Admitted for LTKA, WBAT, KI;  has a past medical history of Anxiety, Breast cancer ([place]), Cancer of right breast ([place]), Depression, GERD (gastroesophageal reflux disease), Hiatal hernia, Hypercholesterolemia, Hypertension, Irritable bowel, and PONV (postoperative nausea and vomiting). ORIF L ankle    PT Comments    Patient tolerated ambulating 54ft X2 with seated rest break due to c/o feeling hot and nauseated. Pt continues to be very guarded with attempting L knee flexion and continues to c/o feeling "groggy". Continue to progress as tolerated.    Follow Up Recommendations  Follow surgeon's recommendation for DC plan and follow-up therapies;Supervision - Intermittent     Equipment Recommendations  Rolling walker with 5" wheels;3in1 (PT)    Recommendations for Other Services OT consult(ordered per protocol)     Precautions / Restrictions Precautions Precautions: Knee Precaution Comments: knee precautions reviewed with pt Required Braces or Orthoses: Knee Immobilizer - Left Knee Immobilizer - Left: On when out of bed or walking Restrictions Weight Bearing Restrictions: Yes LLE Weight Bearing: Weight bearing as tolerated    Mobility  Bed Mobility Overal bed mobility: Needs Assistance Bed Mobility: Supine to Sit     Supine to sit: Min guard     General bed mobility comments: min guard for safety; increased time and effort to bring L LE to EOB with use of R LE to assist and cues for sequencing and technique  Transfers Overall transfer level: Needs assistance Equipment used: Rolling walker (2 wheeled) Transfers: Sit to/from Stand Sit to Stand: Min assist;Min guard         General transfer comment: min A to stand from EOB and min guard assist from [facility]; cues for safe hand placement and  technique  Ambulation/Gait Ambulation/Gait assistance: Min guard Ambulation Distance (Feet): (16ft X2) Assistive device: Rolling walker (2 wheeled) Gait Pattern/deviations: Step-to pattern;Decreased stance time - left;Decreased step length - right;Decreased weight shift to left;Antalgic Gait velocity: decreased   General Gait Details: cues for sequencing, increased bilat step lengths, step length symmetry, and L heel strike toe off; pt reported feeling very hot and required seated rest break    Stairs            Wheelchair Mobility    Modified Rankin (Stroke Patients Only)       Balance Overall balance assessment: Needs assistance   Sitting balance-Leahy Scale: Fair     Standing balance support: Bilateral upper extremity supported;During functional activity Standing balance-Leahy Scale: Poor                              Cognition Arousal/Alertness: Awake/alert Behavior During Therapy: WFL for tasks assessed/performed Overall Cognitive Status: Within Functional Limits for tasks assessed                                 General Comments: pt continues to report feeling groggy      Exercises      General Comments        Pertinent Vitals/Pain Pain Assessment: Faces Faces Pain Scale: Hurts little more Pain Location: L knee  Pain Descriptors / Indicators: Guarding;Sore Pain Intervention(s): Limited activity within patient's tolerance;Monitored during session;Premedicated before session;Repositioned    Home Living  Prior Function            PT Goals (current goals can now be found in the care plan section) Acute Rehab PT Goals PT Goal Formulation: With patient/family Time For Goal Achievement: 09/28/17 Potential to Achieve Goals: Good Progress towards PT goals: Progressing toward goals    Frequency    7X/week      PT Plan Current plan remains appropriate    Co-evaluation               AM-PAC PT "6 Clicks" Daily Activity  Outcome Measure  Difficulty turning over in bed (including adjusting bedclothes, sheets and blankets)?: A Little Difficulty moving from lying on back to sitting on the side of the bed? : A Lot Difficulty sitting down on and standing up from a chair with arms (e.g., wheelchair, bedside commode, etc,.)?: Unable Help needed moving to and from a bed to chair (including a wheelchair)?: A Little Help needed walking in hospital room?: A Little Help needed climbing 3-5 steps with a railing? : A Lot 6 Click Score: 14    End of Session Equipment Utilized During Treatment: Gait belt Activity Tolerance: Other (comment)(pt limited by c/o feeling hot and nauseous ) Patient left: with call bell/phone within reach;in chair Nurse Communication: Mobility status PT Visit Diagnosis: Other abnormalities of gait and mobility (R26.89);Pain Pain - Right/Left: Left Pain - part of body: Knee     Time: 1131-1203 PT Time Calculation (min) (ACUTE ONLY): 32 min  Charges:  $Gait Training: 8-22 mins $Therapeutic Activity: 8-22 mins                    G Codes:       Earney Navy, PTA Pager: [phone]     Darliss Cheney 09/23/2017, 2:03 PM

## 2017-09-23 NOTE — Progress Notes (Signed)
Occupational Therapy Treatment Patient Details Name: Claudia [name] MRN: [id number] DOB: Oct 01, 1955 Today's Date: 09/23/2017    History of present illness Admitted for LTKA, WBAT, KI;  has a past medical history of Anxiety, Breast cancer ([place]), Cancer of right breast ([place]), Depression, GERD (gastroesophageal reflux disease), Hiatal hernia, Hypercholesterolemia, Hypertension, Irritable bowel, and PONV (postoperative nausea and vomiting). ORIF L ankle   OT comments  Pt progressing towards acute OT goals. Session limited by wooziness/dizziness (pain meds recently given). Pt completed bed mobility and LB dressing. Declined further OOB tasks and requested to return to bed "I just need to sleep for half an hour." D/c plan remains appropriate.     Follow Up Recommendations  Home health OT    Equipment Recommendations  None recommended by OT    Recommendations for Other Services      Precautions / Restrictions Precautions Precautions: Knee Precaution Comments: knee precautions reviewed with pt Required Braces or Orthoses: Knee Immobilizer - Left Knee Immobilizer - Left: On when out of bed or walking Restrictions Weight Bearing Restrictions: Yes LLE Weight Bearing: Weight bearing as tolerated       Mobility Bed Mobility Overal bed mobility: Needs Assistance Bed Mobility: Supine to Sit;Sit to Supine     Supine to sit: Min assist;HOB elevated Sit to supine: Mod assist   General bed mobility comments: requires (A) to elevate leg and complete transfer. Min A + sheet as leg raiser to come to EOB. Mod A to return to supine.  Transfers Overall transfer level: Needs assistance Equipment used: Rolling walker (2 wheeled) Transfers: Sit to/from Stand Sit to Stand: Min assist         General transfer comment: min A for anterior weight shift. Assist to position LLE from standing to EOB position.     Balance Overall balance assessment: Needs assistance Sitting-balance support: Feet  supported;Bilateral upper extremity supported Sitting balance-Leahy Scale: Fair Sitting balance - Comments: groggy/dizzy (suspect pain med related)   Standing balance support: Bilateral upper extremity supported;During functional activity Standing balance-Leahy Scale: Poor                             ADL either performed or assessed with clinical judgement   ADL Overall ADL's : Needs assistance/impaired                     Lower Body Dressing: Maximal assistance Lower Body Dressing Details (indicate cue type and reason): Assist to raise LLE to don LB dressing. Pt c/o dizziness in standing (suspect pain meds). Pt completed transfer, therapist completed most of clothing task.                General ADL Comments: Trialed bed mobility using long rolled up sheet as leg raiser. Pt needing min A for bed mobility. Completed LB dressing. Pt requesting to return to bed due to grogginess/dizziness in standing from pain meds "If I could just sleep for half an hour."     Vision       Perception     Praxis      Cognition Arousal/Alertness: Awake/alert Behavior During Therapy: WFL for tasks assessed/performed Overall Cognitive Status: Within Functional Limits for tasks assessed                                 General Comments: Pt reporting feeling sleepy/groggy from pain meds.  Exercises     Shoulder Instructions       General Comments      Pertinent Vitals/ Pain       Pain Assessment: Faces Faces Pain Scale: Hurts little more Pain Location: L knee  Pain Descriptors / Indicators: Grimacing;Guarding;Sharp;Burning Pain Intervention(s): Limited activity within patient's tolerance;Monitored during session;Premedicated before session;Repositioned  Home Living                                          Prior Functioning/Environment              Frequency  Min 2X/week        Progress Toward Goals  OT  Goals(current goals can now be found in the care plan section)  Progress towards OT goals: Progressing toward goals  Acute Rehab OT Goals Patient Stated Goal: beach trip OT Goal Formulation: With patient Time For Goal Achievement: 10/06/17 Potential to Achieve Goals: Good  Plan Discharge plan remains appropriate    Co-evaluation                 AM-PAC PT "6 Clicks" Daily Activity     Outcome Measure   Help from another person eating meals?: None Help from another person taking care of personal grooming?: A Little Help from another person toileting, which includes using toliet, bedpan, or urinal?: A Little Help from another person bathing (including washing, rinsing, drying)?: A Little Help from another person to put on and taking off regular upper body clothing?: A Little Help from another person to put on and taking off regular lower body clothing?: A Little 6 Click Score: 19    End of Session Equipment Utilized During Treatment: Rolling walker;Left knee immobilizer  OT Visit Diagnosis: Unsteadiness on feet (R26.81);Muscle weakness (generalized) (M62.81)   Activity Tolerance Patient limited by pain;Other (comment)(groggy/dizzy in standing)   Patient Left in bed;with call bell/phone within reach   Nurse Communication Mobility status;Other (comment)(woozy/dizzy during session)        Time: 1308-6578 OT Time Calculation (min): 35 min  Charges: OT General Charges $OT Visit: 1 Visit OT Treatments $Self Care/Home Management : 23-37 mins     Hortencia Pilar 09/23/2017, 10:34 AM

## 2017-09-23 NOTE — Progress Notes (Addendum)
   Subjective: 3 Days Post-Op Procedure(s) (LRB): LEFT TOTAL KNEE ARTHROPLASTY (Left) Patient reports pain as moderate.    Objective: Vital signs in last 24 hours: Temp:  [98.1 F (36.7 C)-98.6 F (37 C)] 98.1 F (36.7 C) (02/28 0517) Pulse Rate:  [89-103] 103 (02/28 0517) Resp:  [16-18] 16 (02/28 0517) BP: (98-139)/(51-70) 98/51 (02/28 0517) SpO2:  [94 %-98 %] 94 % (02/28 0517)  Intake/Output from previous day: 02/27 0701 - 02/28 0700 In: 472 [P.O.:472] Out: -  Intake/Output this shift: No intake/output data recorded.  Recent Labs    09/21/17 0559 09/22/17 0821 09/23/17 0632  HGB 10.5* 10.6* 10.4*   Recent Labs    09/22/17 0821 09/23/17 0632  WBC 9.0 9.6  RBC 3.52* 3.54*  HCT 32.1* 32.4*  PLT 170 184   Recent Labs    09/21/17 0559  NA 138  K 3.9  CL 107  CO2 23  BUN 9  CREATININE 0.58  GLUCOSE 147*  CALCIUM 8.2*   No results for input(s): LABPT, INR in the last 72 hours.  cannot SLR yet. has been to 90 on CPM No results found.  Assessment/Plan: 3 Days Post-Op Procedure(s) (LRB): LEFT TOTAL KNEE ARTHROPLASTY (Left) Up with therapy, discharge today with HHPT 3X per week. Office one week.   Claudia [name] 09/23/2017, 7:59 AM

## 2017-09-23 NOTE — Discharge Instructions (Signed)
OK to shower. Do quads exercises and work on knee bending daily. Office one week

## 2017-09-24 ENCOUNTER — Telehealth (INDEPENDENT_AMBULATORY_CARE_PROVIDER_SITE_OTHER): Payer: Self-pay

## 2017-09-24 NOTE — Telephone Encounter (Signed)
Ok for orders? 

## 2017-09-24 NOTE — Telephone Encounter (Signed)
Please call.

## 2017-09-24 NOTE — Telephone Encounter (Signed)
Ronalee Belts from [facility] called and wanted to know if Dr Lorin Mercy would be willing to sign off on [facility] orders. He is request for patient to have HHPT 1x/wk for 1 week then 3x/wk for 2 weeks and plan to d/c to outpatient therapy the week of 03/18.  Please call him to advise [phone]

## 2017-09-24 NOTE — Telephone Encounter (Signed)
I called and gave Claudia [name] verbal ok for orders.

## 2017-09-24 NOTE — Telephone Encounter (Signed)
Yes. ucall

## 2017-09-24 NOTE — Discharge Summary (Signed)
Patient ID: DALTON MILLE MRN: [id number] DOB/AGE: Jun 05, 1956 62 y.o.  Admit date: 09/20/2017 Discharge date: 09/24/2017  Admission Diagnoses:  Active Problems:   Arthritis of left knee   Discharge Diagnoses:  Active Problems:   Arthritis of left knee  status post Procedure(s): LEFT TOTAL KNEE ARTHROPLASTY  Past Medical History:  Diagnosis Date  . Anxiety   . Breast cancer ([place])   . Cancer of right breast ([place])    mastectomy  . Depression   . GERD (gastroesophageal reflux disease)   . Hiatal hernia   . Hypercholesterolemia   . Hypertension   . Irritable bowel   . PONV (postoperative nausea and vomiting)     Surgeries: Procedure(s): LEFT TOTAL KNEE ARTHROPLASTY on 09/20/2017   Consultants:   Discharged Condition: Improved  Hospital Course: VERNADETTE STUTSMAN is an 62 y.o. female who was admitted 09/20/2017 for operative treatment of left knee arthritis. Patient failed conservative treatments (please see the history and physical for the specifics) and had severe unremitting pain that affects sleep, daily activities and work/hobbies. After pre-op clearance, the patient was taken to the operating room on 09/20/2017 and underwent  Procedure(s): LEFT TOTAL KNEE ARTHROPLASTY.    Patient was given perioperative antibiotics:  Anti-infectives (From admission, onward)   Start     Dose/Rate Route Frequency Ordered Stop   09/20/17 2030  ceFAZolin (ANCEF) IVPB 1 g/50 mL premix     1 g 100 mL/hr over 30 Minutes Intravenous Every 8 hours 09/20/17 1607 09/21/17 0612   09/20/17 1052  ceFAZolin (ANCEF) IVPB 2g/100 mL premix     2 g 200 mL/hr over 30 Minutes Intravenous On call to O.R. 09/20/17 1052 09/20/17 1243   09/20/17 1025  ceFAZolin (ANCEF) 2-4 GM/100ML-% IVPB    Comments:  Tamsen Snider   : cabinet override      09/20/17 1025 09/20/17 1238       Patient was given sequential compression devices and early ambulation to prevent DVT.   Patient benefited maximally from  hospital stay and there were no complications. At the time of discharge, the patient was urinating/moving their bowels without difficulty, tolerating a regular diet, pain is controlled with oral pain medications and they have been cleared by PT/OT.   Recent vital signs: No data found.   Recent laboratory studies:  Recent Labs    09/22/17 0821 09/23/17 0632  WBC 9.0 9.6  HGB 10.6* 10.4*  HCT 32.1* 32.4*  PLT 170 184     Discharge Medications:   Allergies as of 09/23/2017      Reactions   Statins Other (See Comments)   Muscle pain      Medication List    STOP taking these medications   HYDROcodone-acetaminophen 10-325 MG tablet Commonly known as:  NORCO     TAKE these medications   alprazolam 2 MG tablet Commonly known as:  XANAX Take 2 mg by mouth 4 (four) times daily.   amLODipine-benazepril 10-20 MG capsule Commonly known as:  LOTREL Take 1 capsule by mouth daily.   cetirizine 10 MG tablet Commonly known as:  ZYRTEC Take 10 mg by mouth daily as needed for allergies.   ezetimibe 10 MG tablet Commonly known as:  ZETIA Take 1 tablet (10 mg total) by mouth daily.   hydrocortisone 2.5 % cream Apply 1 application topically See admin instructions. 2-3 times daily as needed for eczema on face   methocarbamol 500 MG tablet Commonly known as:  ROBAXIN Take 1 tablet (500  mg total) by mouth every 6 (six) hours as needed for muscle spasms.   oxyCODONE-acetaminophen 5-325 MG tablet Commonly known as:  PERCOCET/ROXICET Take 1-2 tablets by mouth every 4 (four) hours as needed for severe pain.   sertraline 100 MG tablet Commonly known as:  ZOLOFT Take 250 mg by mouth daily.   zolpidem 10 MG tablet Commonly known as:  AMBIEN Take 10 mg by mouth at bedtime as needed for sleep.       Diagnostic Studies: Dg Chest 2 View  Result Date: 09/14/2017 CLINICAL DATA:  Left knee replacement on 09/20/2016 EXAM: CHEST  2 VIEW COMPARISON:  Chest x-ray of 12/18/2016, and CT chest  11/12/2005 FINDINGS: The lungs are not as well aerated resulting in some compression of vessels on the right. No active infiltrate or effusion is seen. A tiny nodular opacity anteriorly in the retrosternal airspace on the lateral view was present previously by CT appear to represent a small nodule anteriorly in the right upper lobe on the prior CT most likely benign with little change in the intervening 2 years. Mediastinal and hilar contours are unremarkable. The heart is within normal limits in size. Surgical clips overlie the right axilla. There are mild degenerative changes in the thoracic spine. IMPRESSION: 1. Small nodule anteriorly on the lateral view appears to have been present on CT chest of 2017 and therefore is consistent with a benign process. 2. No definite active process is seen. Electronically Signed   By: Ivar Drape M.D.   On: 09/14/2017 08:35   Dg Knee 1-2 Views Left  Result Date: 09/20/2017 CLINICAL DATA:  Post OP left knee replacement EXAM: LEFT KNEE - 1-2 VIEW COMPARISON:  None. FINDINGS: Osseous alignment is anatomic. Arthroplasty hardware appears intact and appropriately positioned. Expected postsurgical changes within the overlying soft tissues. IMPRESSION: Left knee arthroplasty hardware appears intact and appropriately positioned. No evidence of surgical complicating feature. Electronically Signed   By: Franki Cabot M.D.   On: 09/20/2017 14:54      Follow-up Information    [facility] Follow up.   Specialty:  [place] Why:  A representative from [facility] will contact you to arrange start date and time for your therapy. Contact information: [place] [address] [phone]        Lanae Crumbly, PA-C Follow up in 1 week(s).   Specialties:  Physician Assistant, Orthopedic Surgery Contact information: [place] [address] [phone]           Discharge Plan:  discharge  to home  Disposition:     Signed: Benjiman Core 09/24/2017, 4:59 PM

## 2017-09-30 ENCOUNTER — Ambulatory Visit (INDEPENDENT_AMBULATORY_CARE_PROVIDER_SITE_OTHER): Payer: BLUE CROSS/BLUE SHIELD | Admitting: Orthopaedic Surgery

## 2017-09-30 ENCOUNTER — Encounter (INDEPENDENT_AMBULATORY_CARE_PROVIDER_SITE_OTHER): Payer: Self-pay | Admitting: Orthopaedic Surgery

## 2017-09-30 ENCOUNTER — Ambulatory Visit (INDEPENDENT_AMBULATORY_CARE_PROVIDER_SITE_OTHER): Payer: Self-pay

## 2017-09-30 VITALS — BP 118/84 | HR 109

## 2017-09-30 DIAGNOSIS — Z96652 Presence of left artificial knee joint: Secondary | ICD-10-CM | POA: Insufficient documentation

## 2017-09-30 NOTE — Progress Notes (Signed)
Post-Op Visit Note   Patient: Claudia PIGGOTT           Date of Birth: 15-Aug-1955           MRN: [id number] Visit Date: 09/30/2017 PCP: System, Provider Not In   Assessment & Plan: Left total knee arthroplasty.  X-rays look good.  She has full extension can already flex almost to 90 degrees.  She cannot do a straight leg raise and needs to work hard on quad strengthening.  She just noted the other day that she could lift her leg with an extension lag and is making progress.  Chief Complaint:  Chief Complaint  Patient presents with  . Left Knee - Routine Post Op   Visit Diagnoses:  1. Presence of left artificial knee joint     Plan: Medication renewed, Percocet and Robaxin.  Recheck 5 weeks.  No x-ray needed on return.  Outpatient physical therapy ordered for transition from home health therapy.  Follow-Up Instructions: No Follow-up on file.   Orders:  Orders Placed This Encounter  Procedures  . XR KNEE 3 VIEW RIGHT   No orders of the defined types were placed in this encounter.   Imaging: No results found.  PMFS History: Patient Active Problem List   Diagnosis Date Noted  . Presence of left artificial knee joint 09/30/2017  . Arthritis of left knee 09/20/2017  . Closed fracture of medial malleolus of left ankle with delayed healing 05/29/2014  . Closed trimalleolar fracture of left ankle 10/10/2013  . Thyroid nodule 10/23/2012  . Ataxia 10/22/2012  . Vertigo 10/22/2012  . History of breast cancer 10/22/2012  . Panic disorder 10/22/2012  . Depression 08/31/2012  . Anxiety 08/31/2012   Past Medical History:  Diagnosis Date  . Anxiety   . Breast cancer ([place])   . Cancer of right breast ([place])    mastectomy  . Depression   . GERD (gastroesophageal reflux disease)   . Hiatal hernia   . Hypercholesterolemia   . Hypertension   . Irritable bowel   . PONV (postoperative nausea and vomiting)     Family History  Problem Relation Age of Onset  . Liver disease  Mother   . Hypertension Father   . Heart attack Sister     Past Surgical History:  Procedure Laterality Date  . ABDOMINAL HYSTERECTOMY    . BREAST SURGERY     lumpectomy  . CHOLECYSTECTOMY    . FOOT SURGERY     bilateral spur removal  . HARDWARE REMOVAL Left 05/29/2014   Procedure: REMOVAL OF SCREW AND K-WIRE;  Surgeon: Sanjuana Kava, MD;  Location: [facility];  Service: Orthopedics;  Laterality: Left;  . JOINT REPLACEMENT Left 2017   Ankle  . MASTECTOMY    . ORIF ANKLE FRACTURE Left 10/10/2013   Procedure: OPEN REDUCTION INTERNAL FIXATION (ORIF) ANKLE FRACTURE;  Surgeon: Sanjuana Kava, MD;  Location: [facility];  Service: Orthopedics;  Laterality: Left;  . ORIF ANKLE FRACTURE Left 05/29/2014   Procedure: OPEN REDUCTION INTERNAL FIXATION (ORIF) ANKLE FRACTURE AND PLACEMENT OF BONE GRAFT MATERIAL;  Surgeon: Sanjuana Kava, MD;  Location: [facility];  Service: Orthopedics;  Laterality: Left;  . TONSILLECTOMY    . TOTAL ABDOMINAL HYSTERECTOMY W/ BILATERAL SALPINGOOPHORECTOMY    . TOTAL KNEE ARTHROPLASTY Left 09/20/2017   Procedure: LEFT TOTAL KNEE ARTHROPLASTY;  Surgeon: Marybelle Killings, MD;  Location: [facility];  Service: Orthopedics;  Laterality: Left;  . TUBAL LIGATION     Social History  Occupational History  . Not on file  Tobacco Use  . Smoking status: Current Some Day Smoker    Packs/day: 0.25    Years: 40.00    Pack years: 10.00    Types: Cigarettes  . Smokeless tobacco: Never Used  Substance and Sexual Activity  . Alcohol use: No  . Drug use: No  . Sexual activity: No    Birth control/protection: None

## 2017-09-30 NOTE — Addendum Note (Signed)
Addended by: Brand Males E on: 09/30/2017 09:49 AM   Modules accepted: Orders

## 2017-10-07 ENCOUNTER — Inpatient Hospital Stay (INDEPENDENT_AMBULATORY_CARE_PROVIDER_SITE_OTHER): Payer: BLUE CROSS/BLUE SHIELD | Admitting: Orthopaedic Surgery

## 2017-10-11 ENCOUNTER — Telehealth (INDEPENDENT_AMBULATORY_CARE_PROVIDER_SITE_OTHER): Payer: Self-pay | Admitting: Orthopaedic Surgery

## 2017-10-11 NOTE — Telephone Encounter (Signed)
Time to be off percocet. OK to call in tylenol # 3     One po q 8 hrs prn pain # 30 . Tell her we are weaning pain medication thanks.

## 2017-10-11 NOTE — Telephone Encounter (Signed)
Patient requesting RX refill on oxycodone or a pain medication. She also is wondering if it can be called in or faxed since she lives in [place]. Please advise # [phone]

## 2017-10-11 NOTE — Telephone Encounter (Signed)
Please advise 

## 2017-10-12 MED ORDER — ACETAMINOPHEN-CODEINE #3 300-30 MG PO TABS: 1.0000 | ORAL_TABLET | Freq: Three times a day (TID) | ORAL | 0 refills | Status: DC | PRN

## 2017-10-12 NOTE — Telephone Encounter (Signed)
Called to pharmacy. Patient advised.  

## 2017-10-14 ENCOUNTER — Telehealth (INDEPENDENT_AMBULATORY_CARE_PROVIDER_SITE_OTHER): Payer: Self-pay | Admitting: Radiology

## 2017-10-14 MED ORDER — HYDROCODONE-ACETAMINOPHEN 5-325 MG PO TABS: 1.0000 | ORAL_TABLET | Freq: Every evening | ORAL | 0 refills | Status: DC | PRN

## 2017-10-14 NOTE — Telephone Encounter (Signed)
Ok norco # 20 one po q hs prn

## 2017-10-14 NOTE — Telephone Encounter (Signed)
Patient came in to [place] office after PT. She states that the medication that was called in for her the other day is making her dizzy and sick. She requests something different for pain.   Please advise.

## 2017-10-14 NOTE — Telephone Encounter (Signed)
Per verbal order from Dr. Lorin Mercy, ok for Norco 5/325 one po q hs prn #20 with no refills. He filled out script and gave to patient. Entered into system.

## 2017-11-04 ENCOUNTER — Inpatient Hospital Stay (INDEPENDENT_AMBULATORY_CARE_PROVIDER_SITE_OTHER): Payer: BLUE CROSS/BLUE SHIELD | Admitting: Orthopaedic Surgery

## 2017-11-11 ENCOUNTER — Ambulatory Visit (INDEPENDENT_AMBULATORY_CARE_PROVIDER_SITE_OTHER): Payer: BLUE CROSS/BLUE SHIELD | Admitting: Orthopaedic Surgery

## 2017-11-11 ENCOUNTER — Encounter (INDEPENDENT_AMBULATORY_CARE_PROVIDER_SITE_OTHER): Payer: Self-pay | Admitting: Orthopaedic Surgery

## 2017-11-11 VITALS — BP 101/68 | HR 96 | Ht 64.0 in | Wt 220.0 lb

## 2017-11-11 DIAGNOSIS — Z96652 Presence of left artificial knee joint: Secondary | ICD-10-CM

## 2017-11-11 NOTE — Progress Notes (Signed)
Post-Op Visit Note   Patient: Claudia [name]           Date of Birth: [birth year]           MRN: [id number] Visit Date: 11/11/2017 PCP: System, Provider Not In   Assessment & Plan: New exercises as discussed.  Resume work 12/13/2017.  I will check in 3 months.  She has significant osteoarthritis in her opposite knee but wants to get back to work after left total knee arthroplasty before she considers  Right TKA.   Chief Complaint:  Chief Complaint  Patient presents with  . Left Knee - Routine Post Op, Follow-up   Visit Diagnoses:  1. S/P total knee arthroplasty, left     Plan: Patient is lost 45 pounds.  She is doing her home exercises due to the cost of outpatient physical therapy.  She is set to go back to work on May 20 at [employer].  She is on her feet a lot at work.  We gave her additional strengthening exercises she can work on on her own with a single step, wall squat, or strap across her ankle, etc.  Follow-Up Instructions: Return if symptoms worsen or fail to improve.   Orders:  No orders of the defined types were placed in this encounter.  No orders of the defined types were placed in this encounter.   Imaging: No results found.  PMFS History: Patient Active Problem List   Diagnosis Date Noted  . Presence of left artificial knee joint 09/30/2017  . Arthritis of left knee 09/20/2017  . Closed fracture of medial malleolus of left ankle with delayed healing 05/29/2014  . Closed trimalleolar fracture of left ankle 10/10/2013  . Thyroid nodule 10/23/2012  . Ataxia 10/22/2012  . Vertigo 10/22/2012  . History of breast cancer 10/22/2012  . Panic disorder 10/22/2012  . Depression 08/31/2012  . Anxiety 08/31/2012   Past Medical History:  Diagnosis Date  . Anxiety   . Breast cancer ([place])   . Cancer of right breast ([place])    mastectomy  . Depression   . GERD (gastroesophageal reflux disease)   . Hiatal hernia   . Hypercholesterolemia   . Hypertension   .  Irritable bowel   . PONV (postoperative nausea and vomiting)     Family History  Problem Relation Age of Onset  . Liver disease Mother   . Hypertension Father   . Heart attack Sister     Past Surgical History:  Procedure Laterality Date  . ABDOMINAL HYSTERECTOMY    . BREAST SURGERY     lumpectomy  . CHOLECYSTECTOMY    . FOOT SURGERY     bilateral spur removal  . HARDWARE REMOVAL Left 05/29/2014   Procedure: REMOVAL OF SCREW AND K-WIRE;  Surgeon: Sanjuana Kava, MD;  Location: [facility];  Service: Orthopedics;  Laterality: Left;  . JOINT REPLACEMENT Left 2017   Ankle  . MASTECTOMY    . ORIF ANKLE FRACTURE Left 10/10/2013   Procedure: OPEN REDUCTION INTERNAL FIXATION (ORIF) ANKLE FRACTURE;  Surgeon: Sanjuana Kava, MD;  [facility];  Service: Orthopedics;  Laterality: Left;  . ORIF ANKLE FRACTURE Left 05/29/2014   Procedure: OPEN REDUCTION INTERNAL FIXATION (ORIF) ANKLE FRACTURE AND PLACEMENT OF BONE GRAFT MATERIAL;  Surgeon: Sanjuana Kava, MD;  Location: [facility];  Service: Orthopedics;  Laterality: Left;  . TONSILLECTOMY    . TOTAL ABDOMINAL HYSTERECTOMY W/ BILATERAL SALPINGOOPHORECTOMY    . TOTAL KNEE ARTHROPLASTY Left 09/20/2017   Procedure: LEFT  TOTAL KNEE ARTHROPLASTY;  Surgeon: Marybelle Killings, MD;  Location: [facility];  Service: Orthopedics;  Laterality: Left;  . TUBAL LIGATION     Social History   Occupational History  . Not on file  Tobacco Use  . Smoking status: Current Some Day Smoker    Packs/day: 0.25    Years: 40.00    Pack years: 10.00    Types: Cigarettes  . Smokeless tobacco: Never Used  Substance and Sexual Activity  . Alcohol use: No  . Drug use: No  . Sexual activity: Never    Birth control/protection: None

## 2017-12-09 ENCOUNTER — Inpatient Hospital Stay (INDEPENDENT_AMBULATORY_CARE_PROVIDER_SITE_OTHER): Payer: BLUE CROSS/BLUE SHIELD | Admitting: Orthopaedic Surgery

## 2017-12-10 ENCOUNTER — Encounter (INDEPENDENT_AMBULATORY_CARE_PROVIDER_SITE_OTHER): Payer: Self-pay | Admitting: Radiology

## 2017-12-10 ENCOUNTER — Telehealth (INDEPENDENT_AMBULATORY_CARE_PROVIDER_SITE_OTHER): Payer: Self-pay | Admitting: Orthopaedic Surgery

## 2017-12-10 NOTE — Telephone Encounter (Signed)
Patient is requesting call back from Dr. Lorin Mercy at earliest convenience. I told her he was out of the office today but would go ahead and send the message for her. She did not want to leave any details. CB # [phone]

## 2017-12-10 NOTE — Telephone Encounter (Signed)
I have faxed for pt to [phone] with Claim #: [id number]

## 2017-12-10 NOTE — Telephone Encounter (Signed)
I called. She states she fell on her knee. Knee swollen tight .  Fix her note out of work for one more week. She has appt Thursday with me.  Call pt to get fax #.

## 2017-12-10 NOTE — Telephone Encounter (Signed)
Fyi. Please advise

## 2017-12-16 ENCOUNTER — Telehealth (INDEPENDENT_AMBULATORY_CARE_PROVIDER_SITE_OTHER): Payer: Self-pay | Admitting: Radiology

## 2017-12-16 NOTE — Telephone Encounter (Signed)
Patient called and spoke with Baldo Ash in the [place] office this morning stating that she needed to be seen today. Per previous message in her chart, she spoke with Dr. Lorin Mercy last week and had her work note extended x one week due to fall. I left voicemail for patient advising that she missed her scheduled appointment last week. I asked for return call in regards to whether she just needs return to work note, or if she is continuing to have problems and needs a sooner appointment than his next available. I will wait for return call.

## 2017-12-21 NOTE — Telephone Encounter (Signed)
Patient does feel like she needs sooner appt but they dont have anything until next Thursday at the [facility] location, she also wanted a call back from you to talk about her work note. CB # [phone]

## 2017-12-22 NOTE — Telephone Encounter (Signed)
Patient called back and stated she has a appt in [place] on 6/13 and needs papers sent in to her STD to have a extension on leave until she goes to the appt on 01/06/18

## 2017-12-22 NOTE — Telephone Encounter (Signed)
I left voicemail for patient asking for return call. I did explain that we are in clinic this morning and that she would have to leave me another message for return call. I did ask for more detailed message in regards to her note and what she is needing. I also advised I could work her in for an appt in the [place] office on Friday afternoon, but she may have to wait as she is a work in.

## 2017-12-22 NOTE — Telephone Encounter (Signed)
Please advise. Is it ok for patient to remain out of work until she is seen in the [place] office on 6/13?

## 2017-12-24 ENCOUNTER — Telehealth (INDEPENDENT_AMBULATORY_CARE_PROVIDER_SITE_OTHER): Payer: Self-pay | Admitting: Radiology

## 2017-12-24 ENCOUNTER — Telehealth (INDEPENDENT_AMBULATORY_CARE_PROVIDER_SITE_OTHER): Payer: Self-pay | Admitting: Orthopaedic Surgery

## 2017-12-24 NOTE — Telephone Encounter (Signed)
Note fixed. I left voicemail for patient explaining that note is ready for pick up in the [facility] office. I asked for return call if patient wanted me to fax to [facility] office to pick up or fax somewhere else for her. I did explain that I am in clinic this afternoon and will not be at my phone. I did ask for her to leave detailed message with whomever answers the phone in regards to what she would like done with the note.

## 2017-12-24 NOTE — Telephone Encounter (Signed)
Note faxed.

## 2017-12-24 NOTE — Telephone Encounter (Signed)
atient called back and needs note to state that she is out of work due to all on operated knee until follow up visit in the office on 6/13. She would like faxed to [phone] ATTN: Claim [phone]

## 2017-12-24 NOTE — Telephone Encounter (Signed)
OK - thanks

## 2017-12-24 NOTE — Telephone Encounter (Signed)
Patient called stating that she is needing an extension on her leave from work thru June 13th at her next appointment in [place]. If you could give her a call back at [phone]

## 2017-12-24 NOTE — Telephone Encounter (Signed)
Duplicate message. I left voicemail for patient.

## 2017-12-24 NOTE — Telephone Encounter (Signed)
Patient left voicemail for me stating that she continues to have a lot of problems with her knee after her fall. She requests an extension on her work note until she can see Dr. Lorin Mercy on 6/13 in the [place] office. Message has already been sent to Dr. Lorin Mercy to advise. I will return call to patient once he has approved extension.

## 2018-01-06 ENCOUNTER — Ambulatory Visit (INDEPENDENT_AMBULATORY_CARE_PROVIDER_SITE_OTHER): Payer: Self-pay

## 2018-01-06 ENCOUNTER — Ambulatory Visit (INDEPENDENT_AMBULATORY_CARE_PROVIDER_SITE_OTHER): Payer: BLUE CROSS/BLUE SHIELD | Admitting: Orthopaedic Surgery

## 2018-01-06 ENCOUNTER — Encounter (INDEPENDENT_AMBULATORY_CARE_PROVIDER_SITE_OTHER): Payer: Self-pay | Admitting: Orthopaedic Surgery

## 2018-01-06 VITALS — BP 98/63 | HR 100 | Ht 64.0 in | Wt 200.0 lb

## 2018-01-06 DIAGNOSIS — M25562 Pain in left knee: Secondary | ICD-10-CM | POA: Diagnosis not present

## 2018-01-06 DIAGNOSIS — M79605 Pain in left leg: Secondary | ICD-10-CM

## 2018-01-06 NOTE — Progress Notes (Signed)
Office Visit Note   Patient: Claudia [name]           Date of Birth: July 30, 1955           MRN: [id number] Visit Date: 01/06/2018              Requested by: No referring provider defined for this encounter. PCP: System, Provider Not In   Assessment & Plan: Visit Diagnoses:  1. Acute pain of left knee   2. Pain in left leg     Plan: Post left total knee arthroplasty with knee contusion.  She states she is ready to resume work on Sunday work slip given.  Her primary care physician has her on hydrocodone 10/325 and plans to wean her in December for her January surgery.  We discussed narcotic medication.  Follow-Up Instructions: Return in about 3 months (around 04/08/2018).   Orders:  Orders Placed This Encounter  Procedures  . XR Knee 1-2 Views Left  . XR Tibia/Fibula Left   No orders of the defined types were placed in this encounter.     Procedures: No procedures performed   Clinical Data: No additional findings.   Subjective: Chief Complaint  Patient presents with  . Left Knee - Pain    HPI patient returns post left total knee arthroplasty.  She was doing well after left total knee and then had a fall when she was going up steps and had a knee contusion.  Since that time she has not been able to resume work activities and is slowly had decreased swelling.  She is been using ibuprofen.  Review of Systems updated unchanged from knee surgery.   Objective: Vital Signs: BP 98/63   Pulse 100   Ht 5\' 4"  (1.626 m)   Wt 200 lb (90.7 kg)   BMI 34.33 kg/m   Physical Exam  Constitutional: She is oriented to person, place, and time. She appears well-developed.  HENT:  Head: Normocephalic.  Right Ear: External ear normal.  Left Ear: External ear normal.  Eyes: Pupils are equal, round, and reactive to light.  Neck: No tracheal deviation present. No thyromegaly present.  Cardiovascular: Normal rate.  Pulmonary/Chest: Effort normal.  Abdominal: Soft.    Neurological: She is alert and oriented to person, place, and time.  Skin: Skin is warm and dry.  Psychiatric: She has a normal mood and affect. Her behavior is normal.    Ortho Exam collateral ligaments are stable no contusion to the knee.  Full extension good flexion 120 degrees.  Healed total ankle incision.  Trace lower extremity swelling. Specialty Comments:  No specialty comments available.  Imaging: Xr Knee 1-2 Views Left  Result Date: 01/06/2018 [facility] lateral left knee obtained and reviewed.  This shows well-positioned left total knee arthroplasty without evidence of subsidence negative for acute fracture post fall. Impression: Satisfactory total knee arthroplasty.  Xr Tibia/fibula Left  Result Date: 01/06/2018 [facility] lateral left tibia demonstrates total knee and total ankle arthroplasty negative for acute injury to the tibia or fibula. Impression: Negative for fracture post fall.    PMFS History: Patient Active Problem List   Diagnosis Date Noted  . Presence of left artificial knee joint 09/30/2017  . Arthritis of left knee 09/20/2017  . Closed fracture of medial malleolus of left ankle with delayed healing 05/29/2014  . Closed trimalleolar fracture of left ankle 10/10/2013  . Thyroid nodule 10/23/2012  . Ataxia 10/22/2012  . Vertigo 10/22/2012  . History of breast cancer 10/22/2012  .  Panic disorder 10/22/2012  . Depression 08/31/2012  . Anxiety 08/31/2012   Past Medical History:  Diagnosis Date  . Anxiety   . Breast cancer ([place])   . Cancer of right breast ([place])    mastectomy  . Depression   . GERD (gastroesophageal reflux disease)   . Hiatal hernia   . Hypercholesterolemia   . Hypertension   . Irritable bowel   . PONV (postoperative nausea and vomiting)     Family History  Problem Relation Age of Onset  . Liver disease Mother   . Hypertension Father   . Heart attack Sister     Past Surgical History:  Procedure Laterality Date  . ABDOMINAL HYSTERECTOMY     . BREAST SURGERY     lumpectomy  . CHOLECYSTECTOMY    . FOOT SURGERY     bilateral spur removal  . HARDWARE REMOVAL Left 05/29/2014   Procedure: REMOVAL OF SCREW AND K-WIRE;  Surgeon: Sanjuana Kava, MD;  Location[facility];  Service: Orthopedics;  Laterality: Left;  . JOINT REPLACEMENT Left 2017   Ankle  . MASTECTOMY    . ORIF ANKLE FRACTURE Left 10/10/2013   Procedure: OPEN REDUCTION INTERNAL FIXATION (ORIF) ANKLE FRACTURE;  Surgeon: Sanjuana Kava, MD;  [facility];  Service: Orthopedics;  Laterality: Left;  . ORIF ANKLE FRACTURE Left 05/29/2014   Procedure: OPEN REDUCTION INTERNAL FIXATION (ORIF) ANKLE FRACTURE AND PLACEMENT OF BONE GRAFT MATERIAL;  Surgeon: Sanjuana Kava, MD;  [facility]: [facility];  Service: Orthopedics;  Laterality: Left;  . TONSILLECTOMY    . TOTAL ABDOMINAL HYSTERECTOMY W/ BILATERAL SALPINGOOPHORECTOMY    . TOTAL KNEE ARTHROPLASTY Left 09/20/2017   Procedure: LEFT TOTAL KNEE ARTHROPLASTY;  Surgeon: Marybelle Killings, MD;  Location: [place];  Service: Orthopedics;  Laterality: Left;  . TUBAL LIGATION     Social History   Occupational History  . Not on file  Tobacco Use  . Smoking status: Current Some Day Smoker    Packs/day: 0.25    Years: 40.00    Pack years: 10.00    Types: Cigarettes  . Smokeless tobacco: Never Used  Substance and Sexual Activity  . Alcohol use: No  . Drug use: No  . Sexual activity: Never    Birth control/protection: None

## 2018-06-07 ENCOUNTER — Telehealth (INDEPENDENT_AMBULATORY_CARE_PROVIDER_SITE_OTHER): Payer: Self-pay | Admitting: Orthopaedic Surgery

## 2018-06-07 NOTE — Telephone Encounter (Signed)
Ava from [facility] called stated patient has cleaning tomorrow and wanted to know if patient requires pre meds and if so what kind and wants that faxed over to their office @ [phone]  Patient had hip replacement surgery.  Please contact them asap as this patients appointment is tomorrow.

## 2018-06-07 NOTE — Telephone Encounter (Signed)
Voicemail left for Claudia [name] and note faxed-pt does not require pre med prior to dental appointment.

## 2018-10-19 ENCOUNTER — Other Ambulatory Visit: Payer: Self-pay

## 2018-10-19 ENCOUNTER — Encounter (HOSPITAL_COMMUNITY): Payer: Self-pay | Admitting: Emergency Medicine

## 2018-10-19 ENCOUNTER — Emergency Department (HOSPITAL_COMMUNITY)
Admission: EM | Admit: 2018-10-19 | Discharge: 2018-10-19 | Disposition: A | Discharge status: Home / Self Care | Payer: BLUE CROSS/BLUE SHIELD | Attending: Emergency Medicine | Admitting: Emergency Medicine

## 2018-10-19 DIAGNOSIS — Z853 Personal history of malignant neoplasm of breast: Secondary | ICD-10-CM | POA: Diagnosis not present

## 2018-10-19 DIAGNOSIS — I10 Essential (primary) hypertension: Secondary | ICD-10-CM | POA: Diagnosis not present

## 2018-10-19 DIAGNOSIS — F17210 Nicotine dependence, cigarettes, uncomplicated: Secondary | ICD-10-CM | POA: Diagnosis not present

## 2018-10-19 DIAGNOSIS — Z79899 Other long term (current) drug therapy: Secondary | ICD-10-CM | POA: Insufficient documentation

## 2018-10-19 DIAGNOSIS — H9201 Otalgia, right ear: Secondary | ICD-10-CM | POA: Diagnosis present

## 2018-10-19 DIAGNOSIS — Z96652 Presence of left artificial knee joint: Secondary | ICD-10-CM | POA: Diagnosis not present

## 2018-10-19 DIAGNOSIS — Z96662 Presence of left artificial ankle joint: Secondary | ICD-10-CM | POA: Insufficient documentation

## 2018-10-19 DIAGNOSIS — H60501 Unspecified acute noninfective otitis externa, right ear: Secondary | ICD-10-CM | POA: Insufficient documentation

## 2018-10-19 MED ORDER — AMOXICILLIN-POT CLAVULANATE 875-125 MG PO TABS: 1.0000 | ORAL_TABLET | Freq: Two times a day (BID) | ORAL | 0 refills | Status: DC

## 2018-10-19 MED ORDER — AMOXICILLIN-POT CLAVULANATE 875-125 MG PO TABS
1.0000 | ORAL_TABLET | Freq: Once | ORAL | Status: AC
  Administered 2018-10-19: 1 via ORAL
  Filled 2018-10-19: qty 1

## 2018-10-19 NOTE — Discharge Instructions (Addendum)
Continue using the tobramycin-dexamethazone ear drops. Try to use the wick, once the swelling goes down in your ear canal it will fall out. Take the oral antibiotics until done. Recheck if you get a lot of swelling extending onto your face, fever or increasing pain. You can take your hydrocodone you already have prescribed for pain. You can be evaluated by Dr Benjamine Mola, the ENT specialist, if you aren't improving over the next couple of days. Once you are better, use swimmer's ear after showering or bathing to keep your ear canal dry and prevent this from happening again.

## 2018-10-19 NOTE — ED Triage Notes (Signed)
Patient has right ear pain since Sunday, seen at [facility] on Monday, ear flushed and given drops, today not any better, per  Patient ear swollen and difficulty hearing out of ear.

## 2018-10-19 NOTE — ED Provider Notes (Signed)
[facility] EMERGENCY DEPARTMENT Provider Note   CSN: [id number] Arrival date & time: 10/19/18  2253  Time seen 23:15 PM  History   Chief Complaint Chief Complaint  Patient presents with  . Otalgia    HPI Claudia [name] is a 63 y.o. female.     HPI patient states she thought she was getting eczema in her right ear about 1 to 2 weeks ago and started using Vaseline and hydrocortisone cream over-the-counter on her ear.  She states Monday, March 23 she woke up and she could not hear and she had pain in her right ear.  She went to [facility] and states they tried to flush her ear twice.  They put her on Tobrex-Dex drops however she states when she puts them in they just fall out.  She denies any fever.  She states she is never had this problem before.  She denies having diabetes. She states she does have a history of eczema.  PCP Dr Carlis Abbott in [place], [place] ,  Past Medical History:  Diagnosis Date  . Anxiety   . Breast cancer ([place])   . Cancer of right breast ([place])    mastectomy  . Depression   . GERD (gastroesophageal reflux disease)   . Hiatal hernia   . Hypercholesterolemia   . Hypertension   . Irritable bowel   . PONV (postoperative nausea and vomiting)     Patient Active Problem List   Diagnosis Date Noted  . Presence of left artificial knee joint 09/30/2017  . Arthritis of left knee 09/20/2017  . Closed fracture of medial malleolus of left ankle with delayed healing 05/29/2014  . Closed trimalleolar fracture of left ankle 10/10/2013  . Thyroid nodule 10/23/2012  . Ataxia 10/22/2012  . Vertigo 10/22/2012  . History of breast cancer 10/22/2012  . Panic disorder 10/22/2012  . Depression 08/31/2012  . Anxiety 08/31/2012    Past Surgical History:  Procedure Laterality Date  . ABDOMINAL HYSTERECTOMY    . BREAST SURGERY     lumpectomy  . CHOLECYSTECTOMY    . FOOT SURGERY     bilateral spur removal  . HARDWARE REMOVAL Left 05/29/2014   Procedure: REMOVAL OF  SCREW AND K-WIRE;  Surgeon: Sanjuana Kava, MD;  Location: [facility];  Service: Orthopedics;  Laterality: Left;  . JOINT REPLACEMENT Left 2017   Ankle  . MASTECTOMY    . ORIF ANKLE FRACTURE Left 10/10/2013   Procedure: OPEN REDUCTION INTERNAL FIXATION (ORIF) ANKLE FRACTURE;  Surgeon: Sanjuana Kava, MD;  Location: [facility];  Service: Orthopedics;  Laterality: Left;  . ORIF ANKLE FRACTURE Left 05/29/2014   Procedure: OPEN REDUCTION INTERNAL FIXATION (ORIF) ANKLE FRACTURE AND PLACEMENT OF BONE GRAFT MATERIAL;  Surgeon: Sanjuana Kava, MD;  Location: [facility];  Service: Orthopedics;  Laterality: Left;  . TONSILLECTOMY    . TOTAL ABDOMINAL HYSTERECTOMY W/ BILATERAL SALPINGOOPHORECTOMY    . TOTAL KNEE ARTHROPLASTY Left 09/20/2017   Procedure: LEFT TOTAL KNEE ARTHROPLASTY;  Surgeon: Marybelle Killings, MD;  Location: [place];  Service: Orthopedics;  Laterality: Left;  . TUBAL LIGATION       OB History   No obstetric history on file.      Home Medications    Prior to Admission medications   Medication Sig Start Date End Date Taking? Authorizing Provider  acetaminophen-codeine (TYLENOL #3) 300-30 MG tablet Take 1 tablet by mouth every 8 (eight) hours as needed for moderate pain. 10/12/17   Marybelle Killings, MD  alprazolam Duanne Moron)  2 MG tablet Take 2 mg by mouth 4 (four) times daily.    [provider]  amLODipine-benazepril (LOTREL) 10-20 MG capsule Take 1 capsule by mouth daily.    [provider]  amoxicillin-clavulanate (AUGMENTIN) 875-125 MG tablet Take 1 tablet by mouth 2 (two) times daily. 10/19/18   Rolland Porter, MD  cetirizine (ZYRTEC) 10 MG tablet Take 10 mg by mouth daily as needed for allergies.    [provider]  ezetimibe (ZETIA) 10 MG tablet Take 1 tablet (10 mg total) by mouth daily. 09/02/12   Patrecia Pour, NP  HYDROcodone-acetaminophen (NORCO) 5-325 MG tablet Take 1 tablet by mouth at bedtime as needed for moderate pain. 10/14/17   Marybelle Killings, MD  hydrocortisone 2.5 %  cream Apply 1 application topically See admin instructions. 2-3 times daily as needed for eczema on face 09/02/12   Patrecia Pour, NP  methocarbamol (ROBAXIN) 500 MG tablet Take 1 tablet (500 mg total) by mouth every 6 (six) hours as needed for muscle spasms. Patient not taking: Reported on 11/11/2017 09/23/17   Marybelle Killings, MD  oxyCODONE-acetaminophen (PERCOCET/ROXICET) 5-325 MG tablet Take 1-2 tablets by mouth every 4 (four) hours as needed for severe pain. Patient not taking: Reported on 11/11/2017 09/23/17   Marybelle Killings, MD  sertraline (ZOLOFT) 100 MG tablet Take 250 mg by mouth daily.  09/02/12   Patrecia Pour, NP  zolpidem (AMBIEN) 10 MG tablet Take 10 mg by mouth at bedtime as needed for sleep.  10/04/13   [provider]    Family History Family History  Problem Relation Age of Onset  . Liver disease Mother   . Hypertension Father   . Heart attack Sister     Social History Social History   Tobacco Use  . Smoking status: Current Some Day Smoker    Packs/day: 0.25    Years: 40.00    Pack years: 10.00    Types: Cigarettes  . Smokeless tobacco: Never Used  Substance Use Topics  . Alcohol use: No  . Drug use: No  Employed at [employer] she quit smoking about a year ago   Allergies   Statins   Review of Systems Review of Systems  All other systems reviewed and are negative.    Physical Exam Updated Vital Signs BP 108/74 (BP Location: Right Arm)   Pulse 94   Temp 97.6 F (36.4 C) (Oral)   Resp 20   Ht 5\' 4"  (1.626 m)   Wt 90.7 kg   SpO2 99%   BMI 34.33 kg/m   Vital signs normal    Physical Exam Nursing note reviewed. Exam conducted with a chaperone present.  Constitutional:      General: She is not in acute distress.    Appearance: Normal appearance.  HENT:     Head: Normocephalic and atraumatic.     Comments: Has a lot of cerumen in the left ear canal.  Has mild diffuse redness of the right ear helix, her canal is almost swollen shut  with a small pustule at the opening. Mild redness of the tragus. No redness of swelling on the cheek area adjacent to the ear. Has pain on tragal pulling on the right, not the left.    Left Ear: Tympanic membrane, ear canal and external ear normal.     Nose: Nose normal.  Eyes:     Extraocular Movements: Extraocular movements intact.     Conjunctiva/sclera: Conjunctivae normal.  Pupils: Pupils are equal, round, and reactive to light.  Neck:     Musculoskeletal: Normal range of motion and neck supple.  Cardiovascular:     Rate and Rhythm: Normal rate.     Heart sounds: No murmur.  Pulmonary:     Effort: Pulmonary effort is normal. No respiratory distress.  Musculoskeletal: Normal range of motion.  Skin:    General: Skin is warm and dry.  Neurological:     General: No focal deficit present.     Mental Status: She is alert and oriented to person, place, and time.     Cranial Nerves: No cranial nerve deficit.  Psychiatric:        Mood and Affect: Mood normal.        Behavior: Behavior normal.        Thought Content: Thought content normal.        ED Treatments / Results  Labs (all labs ordered are listed, but only abnormal results are displayed) Labs Reviewed - No data to display  EKG None  Radiology No results found.  Procedures Procedures (including critical care time)  Medications Ordered in ED Medications  amoxicillin-clavulanate (AUGMENTIN) 875-125 MG per tablet 1 tablet (1 tablet Oral Given 10/19/18 2333)     Initial Impression / Assessment and Plan / ED Course  I have reviewed the triage vital signs and the nursing notes.  Pertinent labs & imaging results that were available during my care of the patient were reviewed by me and considered in my medical decision making (see chart for details).       An ear wick was placed in her right ear and her own eardrops were used to expand it.  She was started on Augmentin orally.  Patient was advised to continue  the drops.  She should take the Augmentin for 10 days.  If she should develop fever, or get more swelling or redness in the face she should be reevaluated.  If she is not improving she can be seen by Dr. Benjamine Mola, ENT specialist.  Review the [facility] shows patient gets #90 hydrocodone 10 mg monthly, last filled February 27, alprazolam 1 mg tablets, #150 and # 90 tablets last filled February 25 from her primary care Dr. Carlis Abbott in [place].  Final Clinical Impressions(s) / ED Diagnoses   Final diagnoses:  Acute otitis externa of right ear, unspecified type    ED Discharge Orders         Ordered    amoxicillin-clavulanate (AUGMENTIN) 875-125 MG tablet  2 times daily     10/19/18 2335         Plan discharge  Rolland Porter, MD, Barbette Or, MD 10/19/18 2358

## 2018-10-19 NOTE — ED Notes (Signed)
Ear wick placed in canal and antibiotic drops applied per orders. Patient tolerated well.

## 2019-09-21 ENCOUNTER — Encounter (INDEPENDENT_AMBULATORY_CARE_PROVIDER_SITE_OTHER): Payer: Self-pay | Admitting: *Deleted

## 2019-11-15 ENCOUNTER — Other Ambulatory Visit: Payer: Self-pay

## 2019-11-15 DIAGNOSIS — Z9011 Acquired absence of right breast and nipple: Secondary | ICD-10-CM | POA: Diagnosis not present

## 2019-11-15 DIAGNOSIS — F17210 Nicotine dependence, cigarettes, uncomplicated: Secondary | ICD-10-CM | POA: Insufficient documentation

## 2019-11-15 DIAGNOSIS — C7800 Secondary malignant neoplasm of unspecified lung: Secondary | ICD-10-CM | POA: Diagnosis not present

## 2019-11-15 DIAGNOSIS — R509 Fever, unspecified: Secondary | ICD-10-CM | POA: Insufficient documentation

## 2019-11-15 DIAGNOSIS — R519 Headache, unspecified: Secondary | ICD-10-CM | POA: Diagnosis not present

## 2019-11-15 DIAGNOSIS — I10 Essential (primary) hypertension: Secondary | ICD-10-CM | POA: Insufficient documentation

## 2019-11-15 DIAGNOSIS — Z96652 Presence of left artificial knee joint: Secondary | ICD-10-CM | POA: Diagnosis not present

## 2019-11-15 DIAGNOSIS — Z20822 Contact with and (suspected) exposure to covid-19: Secondary | ICD-10-CM | POA: Diagnosis not present

## 2019-11-15 DIAGNOSIS — Z79899 Other long term (current) drug therapy: Secondary | ICD-10-CM | POA: Insufficient documentation

## 2019-11-16 ENCOUNTER — Emergency Department (HOSPITAL_COMMUNITY)
Admission: EM | Admit: 2019-11-16 | Discharge: 2019-11-16 | Disposition: A | Discharge status: Home / Self Care | Payer: BC Managed Care – PPO | Attending: Emergency Medicine | Admitting: Emergency Medicine

## 2019-11-16 ENCOUNTER — Other Ambulatory Visit: Payer: Self-pay

## 2019-11-16 ENCOUNTER — Encounter (HOSPITAL_COMMUNITY): Payer: Self-pay | Admitting: Emergency Medicine

## 2019-11-16 DIAGNOSIS — R509 Fever, unspecified: Secondary | ICD-10-CM

## 2019-11-16 LAB — CBC WITH DIFFERENTIAL/PLATELET
Abs Immature Granulocytes: 0.05 10*3/uL (ref 0.00–0.07)
Basophils Absolute: 0 10*3/uL (ref 0.0–0.1)
Basophils Relative: 0 %
Eosinophils Absolute: 0.2 10*3/uL (ref 0.0–0.5)
Eosinophils Relative: 2 %
HCT: 38.9 % (ref 36.0–46.0)
Hemoglobin: 12.3 g/dL (ref 12.0–15.0)
Immature Granulocytes: 1 %
Lymphocytes Relative: 8 %
Lymphs Abs: 0.8 10*3/uL (ref 0.7–4.0)
MCH: 27.8 pg (ref 26.0–34.0)
MCHC: 31.6 g/dL (ref 30.0–36.0)
MCV: 87.8 fL (ref 80.0–100.0)
Monocytes Absolute: 0.4 10*3/uL (ref 0.1–1.0)
Monocytes Relative: 4 %
Neutro Abs: 8.3 10*3/uL — ABNORMAL HIGH (ref 1.7–7.7)
Neutrophils Relative %: 85 %
Platelets: 210 10*3/uL (ref 150–400)
RBC: 4.43 MIL/uL (ref 3.87–5.11)
RDW: 15.8 % — ABNORMAL HIGH (ref 11.5–15.5)
WBC: 9.8 10*3/uL (ref 4.0–10.5)
nRBC: 0 % (ref 0.0–0.2)

## 2019-11-16 LAB — BASIC METABOLIC PANEL
Anion gap: 8 (ref 5–15)
BUN: 15 mg/dL (ref 8–23)
CO2: 25 mmol/L (ref 22–32)
Calcium: 8.6 mg/dL — ABNORMAL LOW (ref 8.9–10.3)
Chloride: 102 mmol/L (ref 98–111)
Creatinine, Ser: 1.05 mg/dL — ABNORMAL HIGH (ref 0.44–1.00)
GFR calc Af Amer: >60 mL/min (ref >60)
GFR calc non Af Amer: 57 mL/min — ABNORMAL LOW (ref >60)
Glucose, Bld: 111 mg/dL — ABNORMAL HIGH (ref 70–99)
Potassium: 4.3 mmol/L (ref 3.5–5.1)
Sodium: 135 mmol/L (ref 135–145)

## 2019-11-16 LAB — URINALYSIS, ROUTINE W REFLEX MICROSCOPIC
Bilirubin Urine: NEGATIVE
Glucose, UA: NEGATIVE mg/dL
Hgb urine dipstick: NEGATIVE
Ketones, ur: NEGATIVE mg/dL
Nitrite: NEGATIVE
Protein, ur: NEGATIVE mg/dL
Specific Gravity, Urine: 1.03 (ref 1.005–1.030)
pH: 5 (ref 5.0–8.0)

## 2019-11-16 LAB — RESPIRATORY PANEL BY RT PCR (FLU A&B, COVID)
Influenza A by PCR: NEGATIVE
Influenza B by PCR: NEGATIVE
SARS Coronavirus 2 by RT PCR: NEGATIVE

## 2019-11-16 LAB — POC SARS CORONAVIRUS 2 AG -  ED: SARS Coronavirus 2 Ag: NEGATIVE

## 2019-11-16 NOTE — ED Provider Notes (Signed)
[facility] EMERGENCY DEPARTMENT Provider Note   CSN: [id number] Arrival date & time: 11/15/19  2345     History Chief Complaint  Patient presents with  . Chills    Claudia [name] is a 64 y.o. 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  She denies any other new symptoms, specifically no n/v, abd pain, sob, chest pain, no abdominal pain, dysuria. She does report having some nasal congestion and sinus pressure but no purulent drainage.  She does endorse a dry cough but is chronic and not worsened today.  She has no obvious exposures to Covid but is concerned about this possibility.  Of note, per her [facility] records, she receives infusions of Perjeta and Kanjinti (last infusion 4/6) for her cancer, both which can cause neutropenic fever.    The history is provided by the patient.       Past Medical History:  Diagnosis Date  . Anxiety   . Breast cancer ([place])   . Cancer of right breast ([place])    mastectomy  . Depression   . GERD (gastroesophageal reflux disease)   . Hiatal hernia   . Hypercholesterolemia   . Hypertension   . Irritable bowel   . PONV (postoperative nausea and vomiting)     Patient Active Problem List   Diagnosis Date Noted  . Presence of left artificial knee joint 09/30/2017  . Arthritis of left knee 09/20/2017  . Closed fracture of medial malleolus of left ankle with delayed healing 05/29/2014  . Closed trimalleolar fracture of left ankle 10/10/2013  . Thyroid nodule 10/23/2012  . Ataxia 10/22/2012  . Vertigo 10/22/2012  . History of breast cancer 10/22/2012  . Panic disorder 10/22/2012  . Depression 08/31/2012  . Anxiety 08/31/2012    Past Surgical History:  Procedure  Laterality Date  . ABDOMINAL HYSTERECTOMY    . BREAST SURGERY     lumpectomy  . CHOLECYSTECTOMY    . FOOT SURGERY     bilateral spur removal  . HARDWARE REMOVAL Left 05/29/2014   Procedure: REMOVAL OF SCREW AND K-WIRE;  Surgeon: Sanjuana Kava, MD;  Location: [facility];  Service: Orthopedics;  Laterality: Left;  . JOINT REPLACEMENT Left 2017   Ankle  . MASTECTOMY    . ORIF ANKLE FRACTURE Left 10/10/2013   Procedure: OPEN REDUCTION INTERNAL FIXATION (ORIF) ANKLE FRACTURE;  Surgeon: Sanjuana Kava, MD;  Location: [facility];  Service: Orthopedics;  Laterality: Left;  . ORIF ANKLE FRACTURE Left 05/29/2014   Procedure: OPEN REDUCTION INTERNAL FIXATION (ORIF) ANKLE FRACTURE AND PLACEMENT OF BONE GRAFT MATERIAL;  Surgeon: Sanjuana Kava, MD;  Location: [facility];  Service: Orthopedics;  Laterality: Left;  . TONSILLECTOMY    . TOTAL ABDOMINAL HYSTERECTOMY W/ BILATERAL SALPINGOOPHORECTOMY    . TOTAL KNEE ARTHROPLASTY Left 09/20/2017   Procedure: LEFT TOTAL KNEE ARTHROPLASTY;  Surgeon: Marybelle Killings, MD;  Location: [place];  Service: Orthopedics;  Laterality: Left;  . TUBAL LIGATION       OB History   No obstetric history on file.     Family History  Problem Relation Age of Onset  . Liver disease Mother   . Hypertension Father   . Heart attack Sister     Social History   Tobacco Use  .  Smoking status: Current Some Day Smoker    Packs/day: 0.25    Years: 40.00    Pack years: 10.00    Types: Cigarettes  . Smokeless tobacco: Never Used  Substance Use Topics  . Alcohol use: No  . Drug use: No    Home Medications Prior to Admission medications   Medication Sig Start Date End Date Taking? Authorizing Provider  acetaminophen-codeine (TYLENOL #3) 300-30 MG tablet Take 1 tablet by mouth every 8 (eight) hours as needed for moderate pain. 10/12/17   Marybelle Killings, MD  alprazolam Duanne Moron) 2 MG tablet Take 2 mg by mouth 4 (four) times daily.    [provider]  amLODipine-benazepril (LOTREL)  10-20 MG capsule Take 1 capsule by mouth daily.    [provider]  amoxicillin-clavulanate (AUGMENTIN) 875-125 MG tablet Take 1 tablet by mouth 2 (two) times daily. 10/19/18   Rolland Porter, MD  cetirizine (ZYRTEC) 10 MG tablet Take 10 mg by mouth daily as needed for allergies.    [provider]  ezetimibe (ZETIA) 10 MG tablet Take 1 tablet (10 mg total) by mouth daily. 09/02/12   Patrecia Pour, NP  HYDROcodone-acetaminophen (NORCO) 5-325 MG tablet Take 1 tablet by mouth at bedtime as needed for moderate pain. 10/14/17   Marybelle Killings, MD  hydrocortisone 2.5 % cream Apply 1 application topically See admin instructions. 2-3 times daily as needed for eczema on face 09/02/12   Patrecia Pour, NP  methocarbamol (ROBAXIN) 500 MG tablet Take 1 tablet (500 mg total) by mouth every 6 (six) hours as needed for muscle spasms. Patient not taking: Reported on 11/11/2017 09/23/17   Marybelle Killings, MD  oxyCODONE-acetaminophen (PERCOCET/ROXICET) 5-325 MG tablet Take 1-2 tablets by mouth every 4 (four) hours as needed for severe pain. Patient not taking: Reported on 11/11/2017 09/23/17   Marybelle Killings, MD  sertraline (ZOLOFT) 100 MG tablet Take 250 mg by mouth daily.  09/02/12   Patrecia Pour, NP  zolpidem (AMBIEN) 10 MG tablet Take 10 mg by mouth at bedtime as needed for sleep.  10/04/13   [provider]    Allergies    Statins  Review of Systems   Review of Systems  Constitutional: Positive for chills and fever.  HENT: Negative for congestion and sore throat.   Eyes: Negative.   Respiratory: Negative for chest tightness and shortness of breath.   Cardiovascular: Negative for chest pain.  Gastrointestinal: Negative for abdominal pain and nausea.  Genitourinary: Negative.   Musculoskeletal: Positive for myalgias. Negative for arthralgias, joint swelling and neck pain.  Skin: Negative.  Negative for rash and wound.  Neurological: Positive for headaches. Negative for dizziness, weakness,  light-headedness and numbness.  Psychiatric/Behavioral: Negative.     Physical Exam Updated Vital Signs BP 105/79 (BP Location: Left Arm)   Pulse (!) 108   Temp 99.3 F (37.4 C) (Oral)   Resp 17   Ht 5' 3.5" (1.613 m)   Wt 99.8 kg   SpO2 93%   BMI 38.36 kg/m   Physical Exam Vitals and nursing note reviewed.  Constitutional:      Appearance: Normal appearance. She is well-developed.  HENT:     Head: Normocephalic and atraumatic.     Mouth/Throat:     Mouth: Mucous membranes are moist.     Pharynx: Oropharynx is clear. No posterior oropharyngeal erythema.  Eyes:     Conjunctiva/sclera: Conjunctivae normal.  Cardiovascular:     Rate and Rhythm: Normal  rate and regular rhythm.     Pulses: Normal pulses.     Heart sounds: Normal heart sounds.  Pulmonary:     Effort: Pulmonary effort is normal. No respiratory distress.     Breath sounds: Normal breath sounds. No wheezing or rhonchi.     Comments: Lungs ctab. Abdominal:     General: Bowel sounds are normal.     Palpations: Abdomen is soft.     Tenderness: There is no abdominal tenderness.  Musculoskeletal:        General: Normal range of motion.     Cervical back: Normal range of motion.  Skin:    General: Skin is warm and dry.  Neurological:     General: No focal deficit present.     Mental Status: She is alert.     ED Results / Procedures / Treatments   Labs (all labs ordered are listed, but only abnormal results are displayed) Labs Reviewed  CBC WITH DIFFERENTIAL/PLATELET - Abnormal; Notable for the following components:      Result Value   RDW 15.8 (*)    Neutro Abs 8.3 (*)    All other components within normal limits  BASIC METABOLIC PANEL - Abnormal; Notable for the following components:   Glucose, Bld 111 (*)    Creatinine, Ser 1.05 (*)    Calcium 8.6 (*)    GFR calc non Af Amer 57 (*)    All other components within normal limits  CULTURE, BLOOD (ROUTINE X 2)  CULTURE, BLOOD (ROUTINE X 2)  URINE  CULTURE  RESPIRATORY PANEL BY RT PCR (FLU A&B, COVID)  URINALYSIS, ROUTINE W REFLEX MICROSCOPIC  POC SARS CORONAVIRUS 2 AG -  ED    EKG None  Radiology No results found.  Procedures Procedures (including critical care time)  Medications Ordered in ED Medications - No data to display  ED Course  I have reviewed the triage vital signs and the nursing notes.  Pertinent labs & imaging results that were available during my care of the patient were reviewed by me and considered in my medical decision making (see chart for details).    MDM Rules/Calculators/A&P                      Pt with febrile illness suggesting viral syndrome but is on medications with potential to cause neutropenic fever. Labs including cbc, bmet, blood cultures Covid 19, urinalysis ordered.  CBC is resulted and she is not neutropenic.  Her rapid Covid is negative,    Pt with febrile illness of unclear etiology,probably viral source.  UA and Covid antibody/influenza tests pending.  Pt dispo per Dr Tomi Bamberger once pending labs resulted. Final Clinical Impression(s) / ED Diagnoses Final diagnoses:  None    Rx / DC Orders ED Discharge Orders    None       Landis Martins 11/16/19 [id number]    Rolland Porter, MD 11/16/19 [id number]

## 2019-11-16 NOTE — ED Triage Notes (Signed)
Pt C/O fever, chills, and headache that started this morning. Pt reports temp of 100.3 at home.

## 2019-11-16 NOTE — Discharge Instructions (Addendum)
You did not want to stay and get your test results.  You should contact your oncologist at [facility] tomorrow to check your test results from this morning.  Your CBC shows that your white blood cell counts are normal.  Hopefully you just have a viral illness.  Drink plenty of fluids and take acetaminophen for fever.

## 2019-11-18 LAB — URINE CULTURE: Culture: <10000 — AB

## 2019-11-21 LAB — CULTURE, BLOOD (ROUTINE X 2)
Culture: NO GROWTH
Culture: NO GROWTH
Special Requests: ADEQUATE
Special Requests: ADEQUATE

## 2020-07-23 ENCOUNTER — Other Ambulatory Visit: Payer: Self-pay

## 2020-07-23 ENCOUNTER — Encounter (HOSPITAL_COMMUNITY): Payer: Self-pay | Admitting: Emergency Medicine

## 2020-07-23 ENCOUNTER — Emergency Department (HOSPITAL_COMMUNITY)
Admission: EM | Admit: 2020-07-23 | Discharge: 2020-07-24 | Disposition: A | Discharge status: Home / Self Care | Payer: Medicaid Other | Attending: Emergency Medicine | Admitting: Emergency Medicine

## 2020-07-23 DIAGNOSIS — Z87891 Personal history of nicotine dependence: Secondary | ICD-10-CM | POA: Diagnosis not present

## 2020-07-23 DIAGNOSIS — Z853 Personal history of malignant neoplasm of breast: Secondary | ICD-10-CM | POA: Diagnosis not present

## 2020-07-23 DIAGNOSIS — Z79899 Other long term (current) drug therapy: Secondary | ICD-10-CM | POA: Insufficient documentation

## 2020-07-23 DIAGNOSIS — Z96662 Presence of left artificial ankle joint: Secondary | ICD-10-CM | POA: Diagnosis not present

## 2020-07-23 DIAGNOSIS — Z96652 Presence of left artificial knee joint: Secondary | ICD-10-CM | POA: Insufficient documentation

## 2020-07-23 DIAGNOSIS — L03114 Cellulitis of left upper limb: Secondary | ICD-10-CM | POA: Diagnosis not present

## 2020-07-23 DIAGNOSIS — L02414 Cutaneous abscess of left upper limb: Secondary | ICD-10-CM | POA: Insufficient documentation

## 2020-07-23 DIAGNOSIS — I10 Essential (primary) hypertension: Secondary | ICD-10-CM | POA: Insufficient documentation

## 2020-07-23 DIAGNOSIS — L0291 Cutaneous abscess, unspecified: Secondary | ICD-10-CM

## 2020-07-23 LAB — CBC WITH DIFFERENTIAL/PLATELET
Abs Immature Granulocytes: 0.02 10*3/uL (ref 0.00–0.07)
Basophils Absolute: 0 10*3/uL (ref 0.0–0.1)
Basophils Relative: 1 %
Eosinophils Absolute: 0.1 10*3/uL (ref 0.0–0.5)
Eosinophils Relative: 1 %
HCT: 39.7 % (ref 36.0–46.0)
Hemoglobin: 12.5 g/dL (ref 12.0–15.0)
Immature Granulocytes: 0 %
Lymphocytes Relative: 20 %
Lymphs Abs: 1 10*3/uL (ref 0.7–4.0)
MCH: 28.9 pg (ref 26.0–34.0)
MCHC: 31.5 g/dL (ref 30.0–36.0)
MCV: 91.7 fL (ref 80.0–100.0)
Monocytes Absolute: 0.5 10*3/uL (ref 0.1–1.0)
Monocytes Relative: 9 %
Neutro Abs: 3.4 10*3/uL (ref 1.7–7.7)
Neutrophils Relative %: 69 %
Platelets: 189 10*3/uL (ref 150–400)
RBC: 4.33 MIL/uL (ref 3.87–5.11)
RDW: 14.3 % (ref 11.5–15.5)
WBC: 5 10*3/uL (ref 4.0–10.5)
nRBC: 0 % (ref 0.0–0.2)

## 2020-07-23 NOTE — ED Provider Notes (Signed)
[facility] EMERGENCY DEPARTMENT Provider Note   CSN: [id number] Arrival date & time: 07/23/20  1651     History Chief Complaint  Patient presents with  . Abscess    Claudia [name] is a 64 y.o. female.  Patient presents to the emergency department with concerns over cellulitis of her left arm. Patient reports that she has a spot on the left forearm that is similar to when she has had cellulitis in the past. It started as a small bump and then became a larger lump with surrounding redness. She reports that she bumped it yesterday and some blood and pus came out. She also has a small spot on her scalp that is draining. Patient reports that she is currently receiving treatment for recurrent breast cancer. It sounds as though she is receiving immunotherapy.  Patient reports that she had a fever of 101 at home earlier today. She reports that when she had cellulitis previously, it severely worsened very quickly and she ended up hospitalized.        Past Medical History:  Diagnosis Date  . Anxiety   . Breast cancer ([place])   . Cancer of right breast ([place])    mastectomy  . Depression   . GERD (gastroesophageal reflux disease)   . Hiatal hernia   . Hypercholesterolemia   . Hypertension   . Irritable bowel   . PONV (postoperative nausea and vomiting)     Patient Active Problem List   Diagnosis Date Noted  . Presence of left artificial knee joint 09/30/2017  . Arthritis of left knee 09/20/2017  . Closed fracture of medial malleolus of left ankle with delayed healing 05/29/2014  . Closed trimalleolar fracture of left ankle 10/10/2013  . Thyroid nodule 10/23/2012  . Ataxia 10/22/2012  . Vertigo 10/22/2012  . History of breast cancer 10/22/2012  . Panic disorder 10/22/2012  . Depression 08/31/2012  . Anxiety 08/31/2012    Past Surgical History:  Procedure Laterality Date  . ABDOMINAL HYSTERECTOMY    . BREAST SURGERY     lumpectomy  . CHOLECYSTECTOMY    . FOOT SURGERY      bilateral spur removal  . HARDWARE REMOVAL Left 05/29/2014   Procedure: REMOVAL OF SCREW AND K-WIRE;  Surgeon: Sanjuana Kava, MD;  Location: [facility];  Service: Orthopedics;  Laterality: Left;  . JOINT REPLACEMENT Left 2017   Ankle  . MASTECTOMY    . ORIF ANKLE FRACTURE Left 10/10/2013   Procedure: OPEN REDUCTION INTERNAL FIXATION (ORIF) ANKLE FRACTURE;  Surgeon: Sanjuana Kava, MD;  Location: [facility];  Service: Orthopedics;  Laterality: Left;  . ORIF ANKLE FRACTURE Left 05/29/2014   Procedure: OPEN REDUCTION INTERNAL FIXATION (ORIF) ANKLE FRACTURE AND PLACEMENT OF BONE GRAFT MATERIAL;  Surgeon: Sanjuana Kava, MD;  Location: [facility];  Service: Orthopedics;  Laterality: Left;  . TONSILLECTOMY    . TOTAL ABDOMINAL HYSTERECTOMY W/ BILATERAL SALPINGOOPHORECTOMY    . TOTAL KNEE ARTHROPLASTY Left 09/20/2017   Procedure: LEFT TOTAL KNEE ARTHROPLASTY;  Surgeon: Marybelle Killings, MD;  Location: [facility];  Service: Orthopedics;  Laterality: Left;  . TUBAL LIGATION       OB History   No obstetric history on file.     Family History  Problem Relation Age of Onset  . Liver disease Mother   . Hypertension Father   . Heart attack Sister     Social History   Tobacco Use  . Smoking status: Former Smoker    Packs/day: 0.25    Years:  40.00    Pack years: 10.00    Types: Cigarettes    Quit date: 07/23/2018    Years since quitting: 2.0  . Smokeless tobacco: Never Used  Vaping Use  . Vaping Use: Former  Substance Use Topics  . Alcohol use: No  . Drug use: No    Home Medications Prior to Admission medications   Medication Sig Start Date End Date Taking? Authorizing Provider  sulfamethoxazole-trimethoprim (BACTRIM DS) 800-160 MG tablet Take 1 tablet by mouth 2 (two) times daily. 07/24/20  Yes Thirza Pellicano, Gwenyth Allegra, MD  acetaminophen-codeine (TYLENOL #3) 300-30 MG tablet Take 1 tablet by mouth every 8 (eight) hours as needed for moderate pain. 10/12/17   Marybelle Killings, MD  alprazolam Duanne Moron) 2 MG  tablet Take 2 mg by mouth 4 (four) times daily.    [provider]  amLODipine-benazepril (LOTREL) 10-20 MG capsule Take 1 capsule by mouth daily.    [provider]  amoxicillin-clavulanate (AUGMENTIN) 875-125 MG tablet Take 1 tablet by mouth 2 (two) times daily. 10/19/18   Rolland Porter, MD  cetirizine (ZYRTEC) 10 MG tablet Take 10 mg by mouth daily as needed for allergies.    [provider]  ezetimibe (ZETIA) 10 MG tablet Take 1 tablet (10 mg total) by mouth daily. 09/02/12   Patrecia Pour, NP  HYDROcodone-acetaminophen (NORCO) 5-325 MG tablet Take 1 tablet by mouth at bedtime as needed for moderate pain. 10/14/17   Marybelle Killings, MD  hydrocortisone 2.5 % cream Apply 1 application topically See admin instructions. 2-3 times daily as needed for eczema on face 09/02/12   Patrecia Pour, NP  methocarbamol (ROBAXIN) 500 MG tablet Take 1 tablet (500 mg total) by mouth every 6 (six) hours as needed for muscle spasms. Patient not taking: Reported on 11/11/2017 09/23/17   Marybelle Killings, MD  oxyCODONE-acetaminophen (PERCOCET/ROXICET) 5-325 MG tablet Take 1-2 tablets by mouth every 4 (four) hours as needed for severe pain. Patient not taking: Reported on 11/11/2017 09/23/17   Marybelle Killings, MD  sertraline (ZOLOFT) 100 MG tablet Take 250 mg by mouth daily.  09/02/12   Patrecia Pour, NP  zolpidem (AMBIEN) 10 MG tablet Take 10 mg by mouth at bedtime as needed for sleep.  10/04/13   [provider]    Allergies    Statins  Review of Systems   Review of Systems  Constitutional: Positive for fever.  Skin: Positive for wound.  All other systems reviewed and are negative.   Physical Exam Updated Vital Signs BP 118/87 (BP Location: Right Arm)   Pulse 96   Temp 99.3 F (37.4 C) (Oral)   Resp 20   Ht 5\' 4"  (1.626 m)   Wt 113.4 kg   SpO2 95%   BMI 42.91 kg/m   Physical Exam Vitals and nursing note reviewed.  Constitutional:      General: She is not in acute distress.     Appearance: Normal appearance. She is well-developed and well-nourished.  HENT:     Head: Normocephalic and atraumatic.     Right Ear: Hearing normal.     Left Ear: Hearing normal.     Nose: Nose normal.     Mouth/Throat:     Mouth: Oropharynx is clear and moist and mucous membranes are normal.  Eyes:     Extraocular Movements: EOM normal.     Conjunctiva/sclera: Conjunctivae normal.     Pupils: Pupils are equal, round, and reactive to light.  Cardiovascular:  Rate and Rhythm: Regular rhythm.     Heart sounds: S1 normal and S2 normal. No murmur heard. No friction rub. No gallop.   Pulmonary:     Effort: Pulmonary effort is normal. No respiratory distress.     Breath sounds: Normal breath sounds.  Chest:     Chest wall: No tenderness.  Abdominal:     General: Bowel sounds are normal.     Palpations: Abdomen is soft. There is no hepatosplenomegaly.     Tenderness: There is no abdominal tenderness. There is no guarding or rebound. Negative signs include Murphy's sign and McBurney's sign.     Hernia: No hernia is present.  Musculoskeletal:        General: Normal range of motion.     Cervical back: Normal range of motion and neck supple.  Skin:    General: Skin is warm, dry and intact.     Nails: There is no cyanosis.     Comments: 1 cm tender swollen area mid forearm with 3 cm surrounding erythema. No fluctuance. No drainage.  Tiny pustule vertex scalp, no surrounding erythema  Neurological:     Mental Status: She is alert and oriented to person, place, and time.     GCS: GCS eye subscore is 4. GCS verbal subscore is 5. GCS motor subscore is 6.     Cranial Nerves: No cranial nerve deficit.     Sensory: No sensory deficit.     Coordination: Coordination normal.     Deep Tendon Reflexes: Strength normal.  Psychiatric:        Mood and Affect: Mood and affect normal.        Speech: Speech normal.        Behavior: Behavior normal.        Thought Content: Thought content  normal.     ED Results / Procedures / Treatments   Labs (all labs ordered are listed, but only abnormal results are displayed) Labs Reviewed  BASIC METABOLIC PANEL - Abnormal; Notable for the following components:      Result Value   Potassium 3.4 (*)    Glucose, Bld 100 (*)    All other components within normal limits  CBC WITH DIFFERENTIAL/PLATELET  LACTIC ACID, PLASMA    EKG None  Radiology No results found.  Procedures Procedures (including critical care time)  Medications Ordered in ED Medications - No data to display  ED Course  I have reviewed the triage vital signs and the nursing notes.  Pertinent labs & imaging results that were available during my care of the patient were reviewed by me and considered in my medical decision making (see chart for details).    MDM Rules/Calculators/A&P                          Patient presents to the emergency department with complaints of a red swollen area on her left forearm and on her scalp.  Patient does have a history of abscess/cellulitis.  Patient does have erythema and tenderness with swelling on the forearm but no fluctuance to suggest a drainable abscess.  She has a tiny area on her scalp that is actively draining.  He is currently receiving immunotherapy for breast cancer.  She is not neutropenic.  She reports a fever earlier today but no fever at arrival tonight.  Lactic acid is normal.  White blood cell count is 5.  She does not appear septic.  She appears well on  exam.  Will treat with Bactrim, follow-up with primary care.  Return if symptoms worsen.  Final Clinical Impression(s) / ED Diagnoses Final diagnoses:  Abscess  Cellulitis of left upper extremity    Rx / DC Orders ED Discharge Orders         Ordered    sulfamethoxazole-trimethoprim (BACTRIM DS) 800-160 MG tablet  2 times daily        07/24/20 0029           Orpah Greek, MD 07/24/20 [phone]

## 2020-07-23 NOTE — ED Triage Notes (Signed)
Pt has an abscess or cellulitis on her left posterior arm.  Pt also has a red spot on her scalp.   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

## 2020-07-24 LAB — BASIC METABOLIC PANEL
Anion gap: 8 (ref 5–15)
BUN: 13 mg/dL (ref 8–23)
CO2: 26 mmol/L (ref 22–32)
Calcium: 8.9 mg/dL (ref 8.9–10.3)
Chloride: 102 mmol/L (ref 98–111)
Creatinine, Ser: 0.77 mg/dL (ref 0.44–1.00)
GFR, Estimated: >60 mL/min (ref >60)
Glucose, Bld: 100 mg/dL — ABNORMAL HIGH (ref 70–99)
Potassium: 3.4 mmol/L — ABNORMAL LOW (ref 3.5–5.1)
Sodium: 136 mmol/L (ref 135–145)

## 2020-07-24 LAB — LACTIC ACID, PLASMA: Lactic Acid, Venous: 0.6 mmol/L (ref 0.5–1.9)

## 2020-07-24 MED ORDER — SULFAMETHOXAZOLE-TRIMETHOPRIM 800-160 MG PO TABS: 1.0000 | ORAL_TABLET | Freq: Two times a day (BID) | ORAL | 0 refills | Status: DC

## 2020-07-24 MED ORDER — SULFAMETHOXAZOLE-TRIMETHOPRIM 800-160 MG PO TABS
1.0000 | ORAL_TABLET | Freq: Once | ORAL | Status: AC
  Administered 2020-07-24: 1 via ORAL
  Filled 2020-07-24: qty 1

## 2020-07-31 ENCOUNTER — Encounter (HOSPITAL_COMMUNITY): Payer: Self-pay | Admitting: Internal Medicine

## 2020-07-31 ENCOUNTER — Emergency Department (HOSPITAL_COMMUNITY): Payer: Medicaid Other

## 2020-07-31 ENCOUNTER — Inpatient Hospital Stay (HOSPITAL_COMMUNITY)
Admission: EM | Admit: 2020-07-31 | Discharge: 2020-08-01 | DRG: 177 | Disposition: A | Discharge status: Home / Self Care | Payer: Medicaid Other | Attending: Internal Medicine | Admitting: Internal Medicine

## 2020-07-31 ENCOUNTER — Other Ambulatory Visit: Payer: Self-pay

## 2020-07-31 DIAGNOSIS — E66813 Obesity, class 3: Secondary | ICD-10-CM

## 2020-07-31 DIAGNOSIS — I959 Hypotension, unspecified: Secondary | ICD-10-CM | POA: Diagnosis not present

## 2020-07-31 DIAGNOSIS — R0902 Hypoxemia: Secondary | ICD-10-CM | POA: Diagnosis present

## 2020-07-31 DIAGNOSIS — Z9011 Acquired absence of right breast and nipple: Secondary | ICD-10-CM | POA: Diagnosis not present

## 2020-07-31 DIAGNOSIS — U071 COVID-19: Principal | ICD-10-CM | POA: Diagnosis present

## 2020-07-31 DIAGNOSIS — E7800 Pure hypercholesterolemia, unspecified: Secondary | ICD-10-CM | POA: Diagnosis present

## 2020-07-31 DIAGNOSIS — N179 Acute kidney failure, unspecified: Secondary | ICD-10-CM | POA: Diagnosis present

## 2020-07-31 DIAGNOSIS — Z79899 Other long term (current) drug therapy: Secondary | ICD-10-CM | POA: Diagnosis not present

## 2020-07-31 DIAGNOSIS — C50911 Malignant neoplasm of unspecified site of right female breast: Secondary | ICD-10-CM | POA: Diagnosis present

## 2020-07-31 DIAGNOSIS — E861 Hypovolemia: Secondary | ICD-10-CM | POA: Diagnosis present

## 2020-07-31 DIAGNOSIS — Z90710 Acquired absence of both cervix and uterus: Secondary | ICD-10-CM | POA: Diagnosis not present

## 2020-07-31 DIAGNOSIS — Z853 Personal history of malignant neoplasm of breast: Secondary | ICD-10-CM | POA: Diagnosis not present

## 2020-07-31 DIAGNOSIS — Z6841 Body Mass Index (BMI) 40.0 and over, adult: Secondary | ICD-10-CM

## 2020-07-31 DIAGNOSIS — J9601 Acute respiratory failure with hypoxia: Secondary | ICD-10-CM | POA: Diagnosis present

## 2020-07-31 DIAGNOSIS — L03114 Cellulitis of left upper limb: Secondary | ICD-10-CM | POA: Diagnosis present

## 2020-07-31 DIAGNOSIS — Z79811 Long term (current) use of aromatase inhibitors: Secondary | ICD-10-CM

## 2020-07-31 DIAGNOSIS — I10 Essential (primary) hypertension: Secondary | ICD-10-CM | POA: Diagnosis present

## 2020-07-31 DIAGNOSIS — F419 Anxiety disorder, unspecified: Secondary | ICD-10-CM | POA: Diagnosis present

## 2020-07-31 DIAGNOSIS — K219 Gastro-esophageal reflux disease without esophagitis: Secondary | ICD-10-CM | POA: Diagnosis present

## 2020-07-31 DIAGNOSIS — J1282 Pneumonia due to coronavirus disease 2019: Secondary | ICD-10-CM | POA: Diagnosis present

## 2020-07-31 DIAGNOSIS — Z87891 Personal history of nicotine dependence: Secondary | ICD-10-CM

## 2020-07-31 LAB — CBC WITH DIFFERENTIAL/PLATELET
Abs Immature Granulocytes: 0.03 10*3/uL (ref 0.00–0.07)
Basophils Absolute: 0 10*3/uL (ref 0.0–0.1)
Basophils Relative: 0 %
Eosinophils Absolute: 0 10*3/uL (ref 0.0–0.5)
Eosinophils Relative: 0 %
HCT: 37 % (ref 36.0–46.0)
Hemoglobin: 11.6 g/dL — ABNORMAL LOW (ref 12.0–15.0)
Immature Granulocytes: 1 %
Lymphocytes Relative: 17 %
Lymphs Abs: 0.9 10*3/uL (ref 0.7–4.0)
MCH: 28.6 pg (ref 26.0–34.0)
MCHC: 31.4 g/dL (ref 30.0–36.0)
MCV: 91.4 fL (ref 80.0–100.0)
Monocytes Absolute: 0.1 10*3/uL (ref 0.1–1.0)
Monocytes Relative: 2 %
Neutro Abs: 4.3 10*3/uL (ref 1.7–7.7)
Neutrophils Relative %: 80 %
Platelets: 151 10*3/uL (ref 150–400)
RBC: 4.05 MIL/uL (ref 3.87–5.11)
RDW: 15.3 % (ref 11.5–15.5)
WBC: 5.4 10*3/uL (ref 4.0–10.5)
nRBC: 0 % (ref 0.0–0.2)

## 2020-07-31 LAB — COMPREHENSIVE METABOLIC PANEL
ALT: 38 U/L (ref 0–44)
AST: 91 U/L — ABNORMAL HIGH (ref 15–41)
Albumin: 3.3 g/dL — ABNORMAL LOW (ref 3.5–5.0)
Alkaline Phosphatase: 88 U/L (ref 38–126)
Anion gap: 7 (ref 5–15)
BUN: 13 mg/dL (ref 8–23)
CO2: 22 mmol/L (ref 22–32)
Calcium: 8.4 mg/dL — ABNORMAL LOW (ref 8.9–10.3)
Chloride: 106 mmol/L (ref 98–111)
Creatinine, Ser: 1.32 mg/dL — ABNORMAL HIGH (ref 0.44–1.00)
GFR, Estimated: 45 mL/min — ABNORMAL LOW (ref >60)
Glucose, Bld: 104 mg/dL — ABNORMAL HIGH (ref 70–99)
Potassium: 3.6 mmol/L (ref 3.5–5.1)
Sodium: 135 mmol/L (ref 135–145)
Total Bilirubin: 0.4 mg/dL (ref 0.3–1.2)
Total Protein: 7 g/dL (ref 6.5–8.1)

## 2020-07-31 LAB — LACTIC ACID, PLASMA: Lactic Acid, Venous: 0.8 mmol/L (ref 0.5–1.9)

## 2020-07-31 LAB — PROCALCITONIN: Procalcitonin: 0.1 ng/mL

## 2020-07-31 LAB — RESP PANEL BY RT-PCR (FLU A&B, COVID) ARPGX2
Influenza A by PCR: NEGATIVE
Influenza B by PCR: NEGATIVE
SARS Coronavirus 2 by RT PCR: POSITIVE — AB

## 2020-07-31 LAB — TRIGLYCERIDES: Triglycerides: 70 mg/dL (ref <150)

## 2020-07-31 LAB — D-DIMER, QUANTITATIVE: D-Dimer, Quant: 2.52 ug/mL-FEU — ABNORMAL HIGH (ref 0.00–0.50)

## 2020-07-31 LAB — POC SARS CORONAVIRUS 2 AG -  ED: SARS Coronavirus 2 Ag: NEGATIVE

## 2020-07-31 LAB — FIBRINOGEN: Fibrinogen: 422 mg/dL (ref 210–475)

## 2020-07-31 LAB — HIV ANTIBODY (ROUTINE TESTING W REFLEX): HIV Screen 4th Generation wRfx: NONREACTIVE

## 2020-07-31 LAB — C-REACTIVE PROTEIN: CRP: 6.3 mg/dL — ABNORMAL HIGH (ref <1.0)

## 2020-07-31 LAB — FERRITIN: Ferritin: 268 ng/mL (ref 11–307)

## 2020-07-31 LAB — LACTATE DEHYDROGENASE: LDH: 249 U/L — ABNORMAL HIGH (ref 98–192)

## 2020-07-31 LAB — MAGNESIUM: Magnesium: 2.2 mg/dL (ref 1.7–2.4)

## 2020-07-31 MED ORDER — ONDANSETRON HCL 4 MG/2ML IJ SOLN
4.0000 mg | Freq: Four times a day (QID) | INTRAMUSCULAR | Status: DC | PRN
  Filled 2020-07-31: qty 2

## 2020-07-31 MED ORDER — EZETIMIBE 10 MG PO TABS
10.0000 mg | ORAL_TABLET | Freq: Every day | ORAL | Status: DC
  Administered 2020-07-31 – 2020-08-01 (×2): 10 mg via ORAL
  Filled 2020-07-31 (×2): qty 1

## 2020-07-31 MED ORDER — ALPRAZOLAM 1 MG PO TABS
1.0000 mg | ORAL_TABLET | Freq: Four times a day (QID) | ORAL | Status: DC | PRN
  Administered 2020-07-31: 1 mg via ORAL
  Filled 2020-07-31: qty 1

## 2020-07-31 MED ORDER — DOXYCYCLINE HYCLATE 100 MG PO TABS
100.0000 mg | ORAL_TABLET | Freq: Two times a day (BID) | ORAL | Status: DC
  Administered 2020-07-31 – 2020-08-01 (×3): 100 mg via ORAL
  Filled 2020-07-31 (×3): qty 1

## 2020-07-31 MED ORDER — ACETAMINOPHEN 325 MG PO TABS: 650.0000 mg | ORAL_TABLET | Freq: Four times a day (QID) | ORAL | Status: DC | PRN

## 2020-07-31 MED ORDER — DEXAMETHASONE SODIUM PHOSPHATE 10 MG/ML IJ SOLN
10.0000 mg | Freq: Once | INTRAMUSCULAR | Status: AC
  Administered 2020-07-31: 10 mg via INTRAVENOUS
  Filled 2020-07-31: qty 1

## 2020-07-31 MED ORDER — HYDROCOD POLST-CPM POLST ER 10-8 MG/5ML PO SUER
5.0000 mL | Freq: Two times a day (BID) | ORAL | Status: DC | PRN
  Administered 2020-07-31 – 2020-08-01 (×2): 5 mL via ORAL
  Filled 2020-07-31 (×3): qty 5

## 2020-07-31 MED ORDER — METHYLPREDNISOLONE SODIUM SUCC 125 MG IJ SOLR
0.5000 mg/kg | Freq: Two times a day (BID) | INTRAMUSCULAR | Status: DC
Start: 2020-07-31 — End: 2020-08-02
  Administered 2020-07-31 – 2020-08-01 (×3): 56.875 mg via INTRAVENOUS
  Filled 2020-07-31 (×4): qty 2

## 2020-07-31 MED ORDER — REMDESIVIR 100 MG IV SOLR
100.0000 mg | INTRAVENOUS | Status: AC
Start: 2020-07-31 — End: 2020-07-31
  Administered 2020-07-31 (×2): 100 mg via INTRAVENOUS
  Filled 2020-07-31 (×2): qty 20

## 2020-07-31 MED ORDER — SERTRALINE HCL 50 MG PO TABS
250.0000 mg | ORAL_TABLET | Freq: Every day | ORAL | Status: DC
  Administered 2020-07-31 – 2020-08-01 (×2): 250 mg via ORAL
  Filled 2020-07-31 (×2): qty 5

## 2020-07-31 MED ORDER — SODIUM CHLORIDE 0.9 % IV SOLN: 200.0000 mg | Freq: Once | INTRAVENOUS | Status: DC

## 2020-07-31 MED ORDER — SODIUM CHLORIDE 0.9 % IV SOLN
100.0000 mg | Freq: Every day | INTRAVENOUS | Status: DC
  Administered 2020-08-01: 100 mg via INTRAVENOUS
  Filled 2020-07-31: qty 20

## 2020-07-31 MED ORDER — SODIUM CHLORIDE 0.9 % IV BOLUS
500.0000 mL | Freq: Once | INTRAVENOUS | Status: AC
  Administered 2020-07-31: 500 mL via INTRAVENOUS

## 2020-07-31 MED ORDER — GUAIFENESIN-DM 100-10 MG/5ML PO SYRP
10.0000 mL | ORAL_SOLUTION | ORAL | Status: DC | PRN
  Administered 2020-07-31 – 2020-08-01 (×4): 10 mL via ORAL
  Filled 2020-07-31 (×4): qty 10

## 2020-07-31 MED ORDER — PREDNISONE 20 MG PO TABS: 50.0000 mg | ORAL_TABLET | Freq: Every day | ORAL | Status: DC

## 2020-07-31 MED ORDER — POTASSIUM CHLORIDE CRYS ER 20 MEQ PO TBCR
40.0000 meq | EXTENDED_RELEASE_TABLET | Freq: Once | ORAL | Status: AC
  Administered 2020-07-31: 40 meq via ORAL
  Filled 2020-07-31: qty 2

## 2020-07-31 MED ORDER — SODIUM CHLORIDE 0.9 % IV SOLN
1.0000 g | Freq: Once | INTRAVENOUS | Status: AC
  Administered 2020-07-31: 1 g via INTRAVENOUS
  Filled 2020-07-31: qty 10

## 2020-07-31 MED ORDER — SODIUM CHLORIDE 0.9 % IV SOLN: 100.0000 mg | Freq: Every day | INTRAVENOUS | Status: DC

## 2020-07-31 MED ORDER — SODIUM CHLORIDE 0.9 % IV SOLN
INTRAVENOUS | Status: DC | PRN
  Administered 2020-07-31: 250 mL via INTRAVENOUS

## 2020-07-31 MED ORDER — ENOXAPARIN SODIUM 40 MG/0.4ML ~~LOC~~ SOLN
40.0000 mg | SUBCUTANEOUS | Status: DC
  Administered 2020-07-31: 40 mg via SUBCUTANEOUS
  Filled 2020-07-31: qty 0.4

## 2020-07-31 MED ORDER — IOHEXOL 350 MG/ML SOLN
100.0000 mL | Freq: Once | INTRAVENOUS | Status: AC | PRN
  Administered 2020-07-31: 100 mL via INTRAVENOUS

## 2020-07-31 MED ORDER — ANASTROZOLE 1 MG PO TABS
1.0000 mg | ORAL_TABLET | Freq: Every day | ORAL | Status: DC
  Administered 2020-07-31 – 2020-08-01 (×2): 1 mg via ORAL
  Filled 2020-07-31 (×4): qty 1

## 2020-07-31 MED ORDER — ALBUTEROL SULFATE HFA 108 (90 BASE) MCG/ACT IN AERS
2.0000 | INHALATION_SPRAY | Freq: Four times a day (QID) | RESPIRATORY_TRACT | Status: DC
  Administered 2020-07-31 (×2): 2 via RESPIRATORY_TRACT
  Filled 2020-07-31 (×2): qty 6.7

## 2020-07-31 MED ORDER — ONDANSETRON HCL 4 MG PO TABS: 4.0000 mg | ORAL_TABLET | Freq: Four times a day (QID) | ORAL | Status: DC | PRN

## 2020-07-31 MED ORDER — SODIUM CHLORIDE 0.9 % IV SOLN
500.0000 mg | Freq: Once | INTRAVENOUS | Status: AC
  Administered 2020-07-31: 500 mg via INTRAVENOUS
  Filled 2020-07-31: qty 500

## 2020-07-31 NOTE — ED Notes (Signed)
Pt roomed and placed on O2 @ 2L nasal cannula.

## 2020-07-31 NOTE — Plan of Care (Signed)
  Problem: Education: Goal: Knowledge of General Education information will improve Description: Including pain rating scale, medication(s)/side effects and non-pharmacologic comfort measures Outcome: Progressing   Problem: Health Behavior/Discharge Planning: Goal: Ability to manage health-related needs will improve Outcome: Progressing   Problem: Clinical Measurements: Goal: Ability to maintain clinical measurements within normal limits will improve Outcome: Progressing Goal: Will remain free from infection Outcome: Progressing Goal: Diagnostic test results will improve Outcome: Progressing Goal: Respiratory complications will improve Outcome: Progressing Goal: Cardiovascular complication will be avoided Outcome: Progressing   Problem: Activity: Goal: Risk for activity intolerance will decrease Outcome: Progressing   Problem: Nutrition: Goal: Adequate nutrition will be maintained Outcome: Progressing   Problem: Coping: Goal: Level of anxiety will decrease Outcome: Progressing   Problem: Elimination: Goal: Will not experience complications related to bowel motility Outcome: Progressing Goal: Will not experience complications related to urinary retention Outcome: Progressing   Problem: Pain Managment: Goal: General experience of comfort will improve Outcome: Progressing   Problem: Safety: Goal: Ability to remain free from injury will improve Outcome: Progressing   Problem: Skin Integrity: Goal: Risk for impaired skin integrity will decrease Outcome: Progressing   Problem: Education: Goal: Knowledge of General Education information will improve Description: Including pain rating scale, medication(s)/side effects and non-pharmacologic comfort measures Outcome: Progressing   Problem: Health Behavior/Discharge Planning: Goal: Ability to manage health-related needs will improve Outcome: Progressing   Problem: Clinical Measurements: Goal: Ability to maintain  clinical measurements within normal limits will improve Outcome: Progressing Goal: Will remain free from infection Outcome: Progressing Goal: Diagnostic test results will improve Outcome: Progressing Goal: Respiratory complications will improve Outcome: Progressing Goal: Cardiovascular complication will be avoided Outcome: Progressing   Problem: Activity: Goal: Risk for activity intolerance will decrease Outcome: Progressing   Problem: Nutrition: Goal: Adequate nutrition will be maintained Outcome: Progressing   Problem: Coping: Goal: Level of anxiety will decrease Outcome: Progressing   Problem: Elimination: Goal: Will not experience complications related to bowel motility Outcome: Progressing Goal: Will not experience complications related to urinary retention Outcome: Progressing   Problem: Pain Managment: Goal: General experience of comfort will improve Outcome: Progressing   Problem: Safety: Goal: Ability to remain free from injury will improve Outcome: Progressing   Problem: Skin Integrity: Goal: Risk for impaired skin integrity will decrease Outcome: Progressing   Problem: Education: Goal: Knowledge of risk factors and measures for prevention of condition will improve Outcome: Progressing   Problem: Coping: Goal: Psychosocial and spiritual needs will be supported Outcome: Progressing   Problem: Respiratory: Goal: Will maintain a patent airway Outcome: Progressing Goal: Complications related to the disease process, condition or treatment will be avoided or minimized Outcome: Progressing   

## 2020-07-31 NOTE — ED Provider Notes (Signed)
[facility] EMERGENCY DEPARTMENT Provider Note   CSN: [id number] Arrival date & time: 07/31/20  8341   Time seen 4:25 AM   History Chief Complaint  Patient presents with  . multiple complaints    Claudia [name] is a 65 y.o. female.  HPI   Patient states last week she was seen for cellulitis in her left arm and she has been having fevers off and on since then.  She was treated with Bactrim and it is getting better.  Her granddaughters come in and out of her house and they were sick and they had a positive Covid test on December 28.  Patient reports she started having rhinorrhea 2 to 3 days ago.  She has a chronic cough and states it has gotten looser over the last 2 or 3 days.  She started getting a sore throat about 2 days ago.  She denies nausea or vomiting but has had diarrhea for over a week.  She has about 6 episodes a day of loose diarrhea.  She denies chest pain.  She has chronic shortness of breath that she states is not worse.  However she states at midday today she started noticing her oxygen was dropping down to 83%.  She states it would go back up and down.  Normally her oxygen level is 94 to 96% on room air.  She is not on oxygen at home.  Patient is scheduled to have another immunotherapy done today, January 5 in the cancer center for her stage IV breast cancer.  Her last treatment was 3 weeks ago.  Patient has not had the Covid vaccine.  PCP Pcp, No   Past Medical History:  Diagnosis Date  . Anxiety   . Breast cancer ([place])   . Cancer of right breast ([place])    mastectomy  . Depression   . GERD (gastroesophageal reflux disease)   . Hiatal hernia   . Hypercholesterolemia   . Hypertension   . Irritable bowel   . PONV (postoperative nausea and vomiting)     Patient Active Problem List   Diagnosis Date Noted  . Presence of left artificial knee joint 09/30/2017  . Arthritis of left knee 09/20/2017  . Closed fracture of medial malleolus of left ankle with delayed  healing 05/29/2014  . Closed trimalleolar fracture of left ankle 10/10/2013  . Thyroid nodule 10/23/2012  . Ataxia 10/22/2012  . Vertigo 10/22/2012  . History of breast cancer 10/22/2012  . Panic disorder 10/22/2012  . Depression 08/31/2012  . Anxiety 08/31/2012    Past Surgical History:  Procedure Laterality Date  . ABDOMINAL HYSTERECTOMY    . BREAST SURGERY     lumpectomy  . CHOLECYSTECTOMY    . FOOT SURGERY     bilateral spur removal  . HARDWARE REMOVAL Left 05/29/2014   Procedure: REMOVAL OF SCREW AND K-WIRE;  Surgeon: Sanjuana Kava, MD;  Location: [facility];  Service: Orthopedics;  Laterality: Left;  . JOINT REPLACEMENT Left 2017   Ankle  . MASTECTOMY    . ORIF ANKLE FRACTURE Left 10/10/2013   Procedure: OPEN REDUCTION INTERNAL FIXATION (ORIF) ANKLE FRACTURE;  Surgeon: Sanjuana Kava, MD;  Location: [facility];  Service: Orthopedics;  Laterality: Left;  . ORIF ANKLE FRACTURE Left 05/29/2014   Procedure: OPEN REDUCTION INTERNAL FIXATION (ORIF) ANKLE FRACTURE AND PLACEMENT OF BONE GRAFT MATERIAL;  Surgeon: Sanjuana Kava, MD;  Location: [facility];  Service: Orthopedics;  Laterality: Left;  . TONSILLECTOMY    . TOTAL ABDOMINAL HYSTERECTOMY W/  BILATERAL SALPINGOOPHORECTOMY    . TOTAL KNEE ARTHROPLASTY Left 09/20/2017   Procedure: LEFT TOTAL KNEE ARTHROPLASTY;  Surgeon: Marybelle Killings, MD;  Location: [place];  Service: Orthopedics;  Laterality: Left;  . TUBAL LIGATION       OB History   No obstetric history on file.     Family History  Problem Relation Age of Onset  . Liver disease Mother   . Hypertension Father   . Heart attack Sister     Social History   Tobacco Use  . Smoking status: Former Smoker    Packs/day: 0.25    Years: 40.00    Pack years: 10.00    Types: Cigarettes    Quit date: 07/23/2018    Years since quitting: 2.0  . Smokeless tobacco: Never Used  Vaping Use  . Vaping Use: Former  Substance Use Topics  . Alcohol use: No  . Drug use: No    Home  Medications Prior to Admission medications   Medication Sig Start Date End Date Taking? Authorizing Provider  acetaminophen-codeine (TYLENOL #3) 300-30 MG tablet Take 1 tablet by mouth every 8 (eight) hours as needed for moderate pain. 10/12/17   Marybelle Killings, MD  alprazolam Duanne Moron) 2 MG tablet Take 2 mg by mouth 4 (four) times daily.    [provider]  amLODipine-benazepril (LOTREL) 10-20 MG capsule Take 1 capsule by mouth daily.    [provider]  amoxicillin-clavulanate (AUGMENTIN) 875-125 MG tablet Take 1 tablet by mouth 2 (two) times daily. 10/19/18   Rolland Porter, MD  cetirizine (ZYRTEC) 10 MG tablet Take 10 mg by mouth daily as needed for allergies.    [provider]  ezetimibe (ZETIA) 10 MG tablet Take 1 tablet (10 mg total) by mouth daily. 09/02/12   Patrecia Pour, NP  HYDROcodone-acetaminophen (NORCO) 5-325 MG tablet Take 1 tablet by mouth at bedtime as needed for moderate pain. 10/14/17   Marybelle Killings, MD  hydrocortisone 2.5 % cream Apply 1 application topically See admin instructions. 2-3 times daily as needed for eczema on face 09/02/12   Patrecia Pour, NP  methocarbamol (ROBAXIN) 500 MG tablet Take 1 tablet (500 mg total) by mouth every 6 (six) hours as needed for muscle spasms. Patient not taking: Reported on 11/11/2017 09/23/17   Marybelle Killings, MD  oxyCODONE-acetaminophen (PERCOCET/ROXICET) 5-325 MG tablet Take 1-2 tablets by mouth every 4 (four) hours as needed for severe pain. Patient not taking: Reported on 11/11/2017 09/23/17   Marybelle Killings, MD  sertraline (ZOLOFT) 100 MG tablet Take 250 mg by mouth daily.  09/02/12   Patrecia Pour, NP  sulfamethoxazole-trimethoprim (BACTRIM DS) 800-160 MG tablet Take 1 tablet by mouth 2 (two) times daily. 07/24/20   Orpah Greek, MD  zolpidem (AMBIEN) 10 MG tablet Take 10 mg by mouth at bedtime as needed for sleep.  10/04/13   [provider]    Allergies    Statins  Review of Systems   Review of  Systems  All other systems reviewed and are negative.   Physical Exam Updated Vital Signs BP 103/70   Pulse 87   Temp 99.3 F (37.4 C) (Oral)   Resp 20   Ht 5' 4"  (1.626 m)   Wt 113.4 kg   SpO2 94%   BMI 42.91 kg/m   Physical Exam Vitals and nursing note reviewed.  Constitutional:      General: She is not in acute distress.    Appearance:  Normal appearance. She is normal weight. She is not ill-appearing or toxic-appearing.  HENT:     Head: Normocephalic and atraumatic.     Right Ear: External ear normal.     Left Ear: External ear normal.  Eyes:     Extraocular Movements: Extraocular movements intact.     Conjunctiva/sclera: Conjunctivae normal.     Pupils: Pupils are equal, round, and reactive to light.  Cardiovascular:     Rate and Rhythm: Normal rate and regular rhythm.     Pulses: Normal pulses.     Heart sounds: Normal heart sounds.  Pulmonary:     Effort: Pulmonary effort is normal. No respiratory distress.     Breath sounds: Decreased breath sounds present. No wheezing, rhonchi or rales.  Musculoskeletal:        General: Normal range of motion.     Cervical back: Normal range of motion.  Skin:    General: Skin is warm and dry.  Neurological:     General: No focal deficit present.     Mental Status: She is alert and oriented to person, place, and time.     Cranial Nerves: No cranial nerve deficit.  Psychiatric:        Mood and Affect: Mood normal.        Behavior: Behavior normal.        Thought Content: Thought content normal.     ED Results / Procedures / Treatments   Labs (all labs ordered are listed, but only abnormal results are displayed) Results for orders placed or performed during the hospital encounter of 07/31/20  Blood Culture (routine x 2)   Specimen: BLOOD  Result Value Ref Range   Specimen Description BLOOD LEFT HAND    Special Requests      BOTTLES DRAWN AEROBIC AND ANAEROBIC Blood Culture adequate volume Performed at [facility], [address], [place], [place] [address]    Culture PENDING    Report Status PENDING   Lactic acid, plasma  Result Value Ref Range   Lactic Acid, Venous 0.8 0.5 - 1.9 mmol/L  CBC WITH DIFFERENTIAL  Result Value Ref Range   WBC 5.4 4.0 - 10.5 K/uL   RBC 4.05 3.87 - 5.11 MIL/uL   Hemoglobin 11.6 (L) 12.0 - 15.0 g/dL   HCT 37.0 36.0 - 46.0 %   MCV 91.4 80.0 - 100.0 fL   MCH 28.6 26.0 - 34.0 pg   MCHC 31.4 30.0 - 36.0 g/dL   RDW 15.3 11.5 - 15.5 %   Platelets 151 150 - 400 K/uL   nRBC 0.0 0.0 - 0.2 %   Neutrophils Relative % 80 %   Neutro Abs 4.3 1.7 - 7.7 K/uL   Lymphocytes Relative 17 %   Lymphs Abs 0.9 0.7 - 4.0 K/uL   Monocytes Relative 2 %   Monocytes Absolute 0.1 0.1 - 1.0 K/uL   Eosinophils Relative 0 %   Eosinophils Absolute 0.0 0.0 - 0.5 K/uL   Basophils Relative 0 %   Basophils Absolute 0.0 0.0 - 0.1 K/uL   WBC Morphology VACUOLATED NEUTROPHILS    Immature Granulocytes 1 %   Abs Immature Granulocytes 0.03 0.00 - 0.07 K/uL   Reactive, Benign Lymphocytes PRESENT   Comprehensive metabolic panel  Result Value Ref Range   Sodium 135 135 - 145 mmol/L   Potassium 3.6 3.5 - 5.1 mmol/L   Chloride 106 98 - 111 mmol/L   CO2 22 22 - 32 mmol/L   Glucose, Bld 104 (  H) 70 - 99 mg/dL   BUN 13 8 - 23 mg/dL   Creatinine, Ser 1.32 (H) 0.44 - 1.00 mg/dL   Calcium 8.4 (L) 8.9 - 10.3 mg/dL   Total Protein 7.0 6.5 - 8.1 g/dL   Albumin 3.3 (L) 3.5 - 5.0 g/dL   AST 91 (H) 15 - 41 U/L   ALT 38 0 - 44 U/L   Alkaline Phosphatase 88 38 - 126 U/L   Total Bilirubin 0.4 0.3 - 1.2 mg/dL   GFR, Estimated 45 (L) >60 mL/min   Anion gap 7 5 - 15  D-dimer, quantitative  Result Value Ref Range   D-Dimer, Quant 2.52 (H) 0.00 - 0.50 ug/mL-FEU  Procalcitonin  Result Value Ref Range   Procalcitonin 0.10 ng/mL  Lactate dehydrogenase  Result Value Ref Range   LDH 249 (H) 98 - 192 U/L  Ferritin  Result Value Ref Range   Ferritin 268 11 - 307 ng/mL  Triglycerides  Result Value Ref Range    Triglycerides 70 <150 mg/dL  Fibrinogen  Result Value Ref Range   Fibrinogen 422 210 - 475 mg/dL  C-reactive protein  Result Value Ref Range   CRP 6.3 (H) <1.0 mg/dL  POC SARS Coronavirus 2 Ag-ED - Nasal Swab (BD Veritor Kit)  Result Value Ref Range   SARS Coronavirus 2 Ag NEGATIVE NEGATIVE   Laboratory interpretation all normal except elevated D-dimer, renal insufficiency    EKG EKG Interpretation  Date/Time:  Wednesday July 31 2020 06:21:28 EST Ventricular Rate:  75 PR Interval:    QRS Duration: 86 QT Interval:  488 QTC Calculation: 546 R Axis:   84 Text Interpretation: Sinus rhythm Borderline right axis deviation Low voltage, precordial leads Borderline abnrm T, anterolateral leads Prolonged QT interval Baseline wander No significant change since last tracing 13 Sep 2017 Confirmed by Rolland Porter [id number]) on 07/31/2020 6:53:49 AM   Radiology CT Angio Chest PE W/Cm &/Or Wo Cm  Result Date: 07/31/2020 CLINICAL DATA:  COVID positive. Breast cancer with prior plans to start chemotherapy today. EXAM: CT ANGIOGRAPHY CHEST WITH CONTRAST TECHNIQUE: Multidetector CT imaging of the chest was performed using the standard protocol during bolus administration of intravenous contrast. Multiplanar CT image reconstructions and MIPs were obtained to evaluate the vascular anatomy. CONTRAST:  162m OMNIPAQUE IOHEXOL 350 MG/ML SOLN COMPARISON:  11/13/2015.  Outside head CT report 05/07/2020 FINDINGS: Cardiovascular: Cardiomegaly. No pericardial effusion. Aortic and coronary atherosclerotic calcifications. Satisfactory opacification of the pulmonary arteries to at least the segmental level with no convincing pulmonary filling defect when allowing for intermittent streak and motion artifact, motion especially affecting the upper lobes. Mediastinum/Nodes: Negative for adenopathy or mass in the mediastinum. Negative esophagus. There has been right axillary dissection with no axillary adenopathy. Right breast  implant with chronic intracapsular rupture. Lungs/Pleura: Patchy ground-glass opacity in the bilateral lungs consistent with COVID pneumonia. There is a degree of bronchomalacia with intermittent airway narrowing. Per outside head CT 05/07/2020 there is pulmonary nodularity which is not assessed in this setting. Upper Abdomen: Negative Musculoskeletal: Trabecular coarsening in the medial right clavicle which is stable from 2017. small sclerotic focus in the upper and posterior sternum, a sternal sclerotic foci was described on outside PET-CT. Review of the MIP images confirms the above findings. IMPRESSION: 1. COVID pattern pneumonia. 2. No evidence of pulmonary embolism. 3. Cardiomegaly. Electronically Signed   By: JMonte FantasiaM.D.   On: 07/31/2020 07:22   DG Chest Port 1 View  Result Date: 07/31/2020 CLINICAL DATA:  COVID.  Hypoxia. EXAM: PORTABLE CHEST 1 VIEW COMPARISON:  09/13/2017. FINDINGS: Port-A-Cath noted with tip over right atrium. Heart size normal. Lung volumes. Diffuse bilateral interstitial infiltrates are noted. Findings consistent pneumonitis. No pleural effusion or pneumothorax. Degenerative changes scoliosis thoracic spine. Degenerative changes both shoulders. No acute bony abnormality. Surgical clips right chest. IMPRESSION: 1. Port-A-Cath noted with tip over right atrium. 2. Low lung volumes. Diffuse bilateral interstitial infiltrates. Findings consistent with pneumonitis. Electronically Signed   By: Marcello Moores  Register   On: 07/31/2020 05:19    Procedures .Critical Care Performed by: Rolland Porter, MD Authorized by: Rolland Porter, MD   Critical care provider statement:    Critical care time (minutes):  39   Critical care was necessary to treat or prevent imminent or life-threatening deterioration of the following conditions:  Respiratory failure   Critical care was time spent personally by me on the following activities:  Examination of patient, obtaining history from patient or  surrogate, ordering and review of laboratory studies, ordering and review of radiographic studies, pulse oximetry and re-evaluation of patient's condition   Care discussed with: admitting provider     (including critical care time)  Medications Ordered in ED Medications  azithromycin (ZITHROMAX) 500 mg in sodium chloride 0.9 % 250 mL IVPB (500 mg Intravenous New Bag/Given 07/31/20 0749)  sodium chloride 0.9 % bolus 500 mL (0 mLs Intravenous Stopped 07/31/20 0625)  dexamethasone (DECADRON) injection 10 mg (10 mg Intravenous Given 07/31/20 0625)  cefTRIAXone (ROCEPHIN) 1 g in sodium chloride 0.9 % 100 mL IVPB (0 g Intravenous Stopped 07/31/20 0655)  sodium chloride 0.9 % bolus 500 mL (500 mLs Intravenous New Bag/Given 07/31/20 0746)  iohexol (OMNIPAQUE) 350 MG/ML injection 100 mL (100 mLs Intravenous Contrast Given 07/31/20 0654)    ED Course  I have reviewed the triage vital signs and the nursing notes.  Pertinent labs & imaging results that were available during my care of the patient were reviewed by me and considered in my medical decision making (see chart for details).    MDM Rules/Calculators/A&P                         Patient's blood pressure in triage was 92/58.  Triage nurse reports her pulse ox dropped to 87% during triage.  She was given a 500 cc bolus of fluid.  Patient probably has Covid.  She has a low-grade fever and is hypoxic.  Chest x-ray and laboratory testing was done.  On oxygen her pulse ox is 96%.  She was started on community-acquired pneumonia antibiotics after her point-of-care COVID came back negative.  Patient has a very elevated D-dimer in the setting of a negative COVID test so CTA was done to rule out PE.  Patient is currently being treated for cancer and is at risk for having PE.  Patient CTA chest is consistent with COVID-pneumonia.  Dr. Roderic Palau will talk to the hospitalist about getting patient admitted because it is after change of shift.  Claudia [name] was  evaluated in Emergency Department on 07/31/2020 for the symptoms described in the history of present illness. She was evaluated in the context of the global COVID-19 pandemic, which necessitated consideration that the patient might be at risk for infection with the SARS-CoV-2 virus that causes COVID-19. Institutional protocols and algorithms that pertain to the evaluation of patients at risk for COVID-19 are in a state of rapid change based on information released by regulatory bodies including the CDC  and federal and state organizations. These policies and algorithms were followed during the patient's care in the ED.   Final Clinical Impression(s) / ED Diagnoses Final diagnoses:  Pneumonia due to COVID-19 virus  Hypoxia  Hypotension, unspecified hypotension type    Rx / DC Orders   Plan admission   Rolland Porter, MD 07/31/20 [phone]

## 2020-07-31 NOTE — H&P (Addendum)
History and Physical    Claudia [name] [id number] DOB: 1956/07/04 DOA: 07/31/2020  PCP: Pcp, No  Patient coming from: home  I have personally briefly reviewed patient's old medical records in [place]  Chief Complaint: shortness of breath  HPI: Claudia [name] is a 65 y.o. female with medical history significant of stage IV breast cancer, obesity, hypertension, presents to the hospital with cough and shortness of breath.  She reports that she noticed a change in her chronic cough whereas it became more productive over the past week.  Since yesterday, she has had worsening shortness of breath and dyspnea on exertion.  She reports being febrile over the last several days.  She was seen in the emergency room approximately a week ago for left arm cellulitis and was prescribed a course of Bactrim.  She reports taking her antibiotic with improvement of her cellulitis.  Clinically, she had improved until she started to feel sick again.  She reports of several of her grandchildren also tested positive for Covid.  She is unvaccinated.  ED Course: Initial blood pressure noted to be low on admission, was responded to IV fluids.  Covid antigen negative, PCR positive.  CT angiogram of chest negative for PE, but does demonstrate changes consistent with Covid pneumonia.  She was noted to be hypoxic on room air and supplemental oxygen was applied.  She is been referred for admission.  Review of Systems:  Review of Systems  Constitutional: Positive for chills, fever and malaise/fatigue.  HENT: Positive for congestion. Negative for sore throat.   Eyes: Negative for blurred vision and double vision.  Respiratory: Positive for cough, sputum production and shortness of breath.   Cardiovascular: Negative for chest pain and leg swelling.  Gastrointestinal: Negative for abdominal pain, diarrhea, heartburn, nausea and vomiting.  Genitourinary: Negative for dysuria.  Musculoskeletal: Positive for  myalgias.  Neurological: Positive for weakness. Negative for dizziness and focal weakness.       Past Medical History:  Diagnosis Date  . Anxiety   . Breast cancer ([place])   . Cancer of right breast ([place])    mastectomy  . Depression   . GERD (gastroesophageal reflux disease)   . Hiatal hernia   . Hypercholesterolemia   . Hypertension   . Irritable bowel   . PONV (postoperative nausea and vomiting)     Past Surgical History:  Procedure Laterality Date  . ABDOMINAL HYSTERECTOMY    . BREAST SURGERY     lumpectomy  . CHOLECYSTECTOMY    . FOOT SURGERY     bilateral spur removal  . HARDWARE REMOVAL Left 05/29/2014   Procedure: REMOVAL OF SCREW AND K-WIRE;  Surgeon: Sanjuana Kava, MD;  Location: [facility];  Service: Orthopedics;  Laterality: Left;  . JOINT REPLACEMENT Left 2017   Ankle  . MASTECTOMY    . ORIF ANKLE FRACTURE Left 10/10/2013   Procedure: OPEN REDUCTION INTERNAL FIXATION (ORIF) ANKLE FRACTURE;  Surgeon: Sanjuana Kava, MD;  Location: [facility];  Service: Orthopedics;  Laterality: Left;  . ORIF ANKLE FRACTURE Left 05/29/2014   Procedure: OPEN REDUCTION INTERNAL FIXATION (ORIF) ANKLE FRACTURE AND PLACEMENT OF BONE GRAFT MATERIAL;  Surgeon: Sanjuana Kava, MD;  Location: [facility];  Service: Orthopedics;  Laterality: Left;  . TONSILLECTOMY    . TOTAL ABDOMINAL HYSTERECTOMY W/ BILATERAL SALPINGOOPHORECTOMY    . TOTAL KNEE ARTHROPLASTY Left 09/20/2017   Procedure: LEFT TOTAL KNEE ARTHROPLASTY;  Surgeon: Marybelle Killings, MD;  Location: [facility];  Service: Orthopedics;  Laterality:  Left;  . TUBAL LIGATION      Social History:  reports that she quit smoking about 2 years ago. Her smoking use included cigarettes. She has a 10.00 pack-year smoking history. She has never used smokeless tobacco. She reports that she does not drink alcohol and does not use drugs.  Allergies  Allergen Reactions  . Statins Other (See Comments)    Muscle pain    Family History  Problem Relation Age of Onset   . Liver disease Mother   . Hypertension Father   . Heart attack Sister      Prior to Admission medications   Medication Sig Start Date End Date Taking? Authorizing Provider  alprazolam Prudy Feeler) 2 MG tablet Take 2 mg by mouth 4 (four) times daily.   Yes [provider]  amLODipine-benazepril (LOTREL) 10-20 MG capsule Take 1 capsule by mouth daily.   Yes [provider]  anastrozole (ARIMIDEX) 1 MG tablet Take 1 tablet by mouth daily. 07/17/20  Yes [provider]  cetirizine (ZYRTEC) 10 MG tablet Take 10 mg by mouth daily as needed for allergies.   Yes [provider]  ezetimibe (ZETIA) 10 MG tablet Take 1 tablet (10 mg total) by mouth daily. 09/02/12  Yes Charm Rings, NP  gabapentin (NEURONTIN) 100 MG capsule Take by mouth. 05/07/20  Yes [provider]  HYDROcodone-acetaminophen (NORCO) 5-325 MG tablet Take 1 tablet by mouth at bedtime as needed for moderate pain. 10/14/17  Yes Eldred Manges, MD  sertraline (ZOLOFT) 100 MG tablet Take 250 mg by mouth daily.  09/02/12  Yes Charm Rings, NP  sulfamethoxazole-trimethoprim (BACTRIM DS) 800-160 MG tablet Take 1 tablet by mouth 2 (two) times daily. 07/24/20  Yes Pollina, Canary Brim, MD  acetaminophen-codeine (TYLENOL #3) 300-30 MG tablet Take 1 tablet by mouth every 8 (eight) hours as needed for moderate pain. 10/12/17   Eldred Manges, MD  amoxicillin-clavulanate (AUGMENTIN) 875-125 MG tablet Take 1 tablet by mouth 2 (two) times daily. Patient not taking: Reported on 07/31/2020 10/19/18   Devoria Albe, MD  hydrocortisone 2.5 % cream Apply 1 application topically See admin instructions. 2-3 times daily as needed for eczema on face Patient not taking: Reported on 07/31/2020 09/02/12   Charm Rings, NP  methocarbamol (ROBAXIN) 500 MG tablet Take 1 tablet (500 mg total) by mouth every 6 (six) hours as needed for muscle spasms. Patient not taking: No sig reported 09/23/17   Eldred Manges, MD   oxyCODONE-acetaminophen (PERCOCET/ROXICET) 5-325 MG tablet Take 1-2 tablets by mouth every 4 (four) hours as needed for severe pain. Patient not taking: No sig reported 09/23/17   Eldred Manges, MD    Physical Exam: Vitals:   07/31/20 1030 07/31/20 1100 07/31/20 1130 07/31/20 1200  BP: 111/67 114/72 119/86 139/84  Pulse: 80 78 84 86  Resp: (!) 27 (!) 30 (!) 32 (!) 31  Temp:      TempSrc:      SpO2: 94% 90% (!) 89% 94%  Weight:      Height:        Constitutional: NAD, calm, comfortable Eyes: PERRL, lids and conjunctivae normal ENMT: Mucous membranes are moist. Posterior pharynx clear of any exudate or lesions.Normal dentition.  Neck: normal, supple, no masses, no thyromegaly Respiratory: clear to auscultation bilaterally, no wheezing, no crackles. Normal respiratory effort. No accessory muscle use.  Cardiovascular: Regular rate and rhythm, no murmurs / rubs / gallops. No extremity edema. 2+ pedal pulses. No carotid  bruits.  Abdomen: no tenderness, no masses palpated. No hepatosplenomegaly. Bowel sounds positive.  Musculoskeletal: no clubbing / cyanosis. No joint deformity upper and lower extremities. Good ROM, no contractures. Normal muscle tone.  Skin: wound with surrounding erythema on left forearm Neurologic: CN 2-12 grossly intact. Sensation intact, DTR normal. Strength 5/5 in all 4.  Psychiatric: Normal judgment and insight. Alert and oriented x 3. Normal mood.    Labs on Admission: I have personally reviewed following labs and imaging studies  CBC: Recent Labs  Lab 07/31/20 0520  WBC 5.4  NEUTROABS 4.3  HGB 11.6*  HCT 37.0  MCV 91.4  PLT 123XX123   Basic Metabolic Panel: Recent Labs  Lab 07/31/20 0520  NA 135  K 3.6  CL 106  CO2 22  GLUCOSE 104*  BUN 13  CREATININE 1.32*  CALCIUM 8.4*   GFR: Estimated Creatinine Clearance: 53.9 mL/min (A) (by C-G formula based on SCr of 1.32 mg/dL (H)). Liver Function Tests: Recent Labs  Lab 07/31/20 0520  AST 91*  ALT  38  ALKPHOS 88  BILITOT 0.4  PROT 7.0  ALBUMIN 3.3*   No results for input(s): LIPASE, AMYLASE in the last 168 hours. No results for input(s): AMMONIA in the last 168 hours. Coagulation Profile: No results for input(s): INR, PROTIME in the last 168 hours. Cardiac Enzymes: No results for input(s): CKTOTAL, CKMB, CKMBINDEX, TROPONINI in the last 168 hours. BNP (last 3 results) No results for input(s): PROBNP in the last 8760 hours. HbA1C: No results for input(s): HGBA1C in the last 72 hours. CBG: No results for input(s): GLUCAP in the last 168 hours. Lipid Profile: Recent Labs    07/31/20 0520  TRIG 70   Thyroid Function Tests: No results for input(s): TSH, T4TOTAL, FREET4, T3FREE, THYROIDAB in the last 72 hours. Anemia Panel: Recent Labs    07/31/20 0520  FERRITIN 268   Urine analysis:    Component Value Date/Time   COLORURINE AMBER (A) 11/16/2019 0221   APPEARANCEUR HAZY (A) 11/16/2019 0221   LABSPEC 1.030 11/16/2019 0221   PHURINE 5.0 11/16/2019 0221   GLUCOSEU NEGATIVE 11/16/2019 0221   HGBUR NEGATIVE 11/16/2019 0221   BILIRUBINUR NEGATIVE 11/16/2019 0221   KETONESUR NEGATIVE 11/16/2019 0221   PROTEINUR NEGATIVE 11/16/2019 0221   UROBILINOGEN 0.2 05/24/2014 0830   NITRITE NEGATIVE 11/16/2019 0221   LEUKOCYTESUR TRACE (A) 11/16/2019 0221    Radiological Exams on Admission: CT Angio Chest PE W/Cm &/Or Wo Cm  Result Date: 07/31/2020 CLINICAL DATA:  COVID positive. Breast cancer with prior plans to start chemotherapy today. EXAM: CT ANGIOGRAPHY CHEST WITH CONTRAST TECHNIQUE: Multidetector CT imaging of the chest was performed using the standard protocol during bolus administration of intravenous contrast. Multiplanar CT image reconstructions and MIPs were obtained to evaluate the vascular anatomy. CONTRAST:  135mL OMNIPAQUE IOHEXOL 350 MG/ML SOLN COMPARISON:  11/13/2015.  Outside head CT report 05/07/2020 FINDINGS: Cardiovascular: Cardiomegaly. No pericardial  effusion. Aortic and coronary atherosclerotic calcifications. Satisfactory opacification of the pulmonary arteries to at least the segmental level with no convincing pulmonary filling defect when allowing for intermittent streak and motion artifact, motion especially affecting the upper lobes. Mediastinum/Nodes: Negative for adenopathy or mass in the mediastinum. Negative esophagus. There has been right axillary dissection with no axillary adenopathy. 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 Lungs/Pleura: Patchy ground-glass opacity in the bilateral lungs consistent with COVID pneumonia. There is a degree of bronchomalacia with intermittent airway narrowing. Per outside head CT 05/07/2020 there is pulmonary nodularity which is  not assessed in this setting. Upper Abdomen: Negative Musculoskeletal: Trabecular coarsening in the medial right clavicle which is stable from 2017. small sclerotic focus in the upper and posterior sternum, a sternal sclerotic foci was described on outside PET-CT. Review of the MIP images confirms the above findings. IMPRESSION: 1. COVID pattern pneumonia. 2. No evidence of pulmonary embolism. 3. Cardiomegaly. Electronically Signed   By: Monte Fantasia M.D.   On: 07/31/2020 07:22   DG Chest Port 1 View  Result Date: 07/31/2020 CLINICAL DATA:  COVID.  Hypoxia. EXAM: PORTABLE CHEST 1 VIEW COMPARISON:  09/13/2017. FINDINGS: Port-A-Cath noted with tip over right atrium. Heart size normal. Lung volumes. Diffuse bilateral interstitial infiltrates are noted. Findings consistent pneumonitis. No pleural effusion or pneumothorax. Degenerative changes scoliosis thoracic spine. Degenerative changes both shoulders. No acute bony abnormality. Surgical clips right chest. IMPRESSION: 1. Port-A-Cath noted with tip over right atrium. 2. Low lung volumes. Diffuse bilateral interstitial infiltrates. Findings consistent with pneumonitis. Electronically Signed   By: Marcello Moores  Register   On:  07/31/2020 05:19    EKG: Independently reviewed. Sinus rhythm, without acute changes. Prolonged QT  Assessment/Plan Active Problems:   Anxiety   History of breast cancer   Acute respiratory failure with hypoxia ([facility])   Pneumonia due to COVID-19 virus   AKI (acute kidney injury) ([facility])   Obesity, Class III, BMI 40-49.9 (morbid obesity) ([place])     Acute respiratory failure with hypoxia -Secondary to COVID-19 pneumonia -Currently on 2 L of oxygen -We will try and wean off oxygen as tolerated  COVID-19 pneumonia -Inflammatory markers are mildly elevated -Started on remdesivir and intravenous steroids -Encourage pulmonary hygiene/prone position  Acute kidney injury -Likely some degree of hypovolemia as well as bacteremias -Monitor urine output and renal function  Left forearm cellulitis -She has been taking Bactrim for this -We will discontinue further Bactrim and substitute for doxycycline in light of elevated creatinine  Class III obesity, BMI 42 -This place patient is at high high risk for decompensation with Covid  History of stage IV breast cancer -Continue anastrozole  Anxiety -Continue as needed benzodiazepines  DVT prophylaxis:  lovenox Code Status: full code  Family Communication: updated sister over the phone 07/31/20  Disposition Plan: discharge home once respiratory status has stabilized  Consults called:   Admission status: inpatient, medsurg   Kathie Dike MD [facility]   If 7PM-7AM, please contact night-coverage [email or web address]   07/31/2020, 12:45 PM

## 2020-07-31 NOTE — ED Triage Notes (Signed)
Pt's O2 sats dropped during traige to 87% and pt can bring sats back up when she takes in a deep breath but remain between 91-92%.

## 2020-07-31 NOTE — ED Triage Notes (Signed)
Pt with Stage 4 breast cancer-was to start treatment today and had a Covid test prior to starting therapy and it came back positive. Pt here tonight because she states her O2 levels have been dropping into 80's at home and that she "can't seem to keep her eyes open". Pt states she has been feeling unwell for 1 week. States she has had bad diarrhea, a cough which "isn't new", and shortness of breath which " isn't new".

## 2020-08-01 LAB — CBC WITH DIFFERENTIAL/PLATELET
Abs Immature Granulocytes: 0.01 10*3/uL (ref 0.00–0.07)
Basophils Absolute: 0 10*3/uL (ref 0.0–0.1)
Basophils Relative: 0 %
Eosinophils Absolute: 0 10*3/uL (ref 0.0–0.5)
Eosinophils Relative: 0 %
HCT: 36.7 % (ref 36.0–46.0)
Hemoglobin: 11.1 g/dL — ABNORMAL LOW (ref 12.0–15.0)
Immature Granulocytes: 0 %
Lymphocytes Relative: 19 %
Lymphs Abs: 0.7 10*3/uL (ref 0.7–4.0)
MCH: 28 pg (ref 26.0–34.0)
MCHC: 30.2 g/dL (ref 30.0–36.0)
MCV: 92.4 fL (ref 80.0–100.0)
Monocytes Absolute: 0.2 10*3/uL (ref 0.1–1.0)
Monocytes Relative: 4 %
Neutro Abs: 2.6 10*3/uL (ref 1.7–7.7)
Neutrophils Relative %: 77 %
Platelets: 155 10*3/uL (ref 150–400)
RBC: 3.97 MIL/uL (ref 3.87–5.11)
RDW: 15.1 % (ref 11.5–15.5)
WBC: 3.4 10*3/uL — ABNORMAL LOW (ref 4.0–10.5)
nRBC: 0 % (ref 0.0–0.2)

## 2020-08-01 LAB — FERRITIN: Ferritin: 319 ng/mL — ABNORMAL HIGH (ref 11–307)

## 2020-08-01 LAB — COMPREHENSIVE METABOLIC PANEL
ALT: 36 U/L (ref 0–44)
AST: 40 U/L (ref 15–41)
Albumin: 3.2 g/dL — ABNORMAL LOW (ref 3.5–5.0)
Alkaline Phosphatase: 76 U/L (ref 38–126)
Anion gap: 8 (ref 5–15)
BUN: 11 mg/dL (ref 8–23)
CO2: 22 mmol/L (ref 22–32)
Calcium: 8.7 mg/dL — ABNORMAL LOW (ref 8.9–10.3)
Chloride: 109 mmol/L (ref 98–111)
Creatinine, Ser: 0.62 mg/dL (ref 0.44–1.00)
GFR, Estimated: >60 mL/min (ref >60)
Glucose, Bld: 161 mg/dL — ABNORMAL HIGH (ref 70–99)
Potassium: 4.8 mmol/L (ref 3.5–5.1)
Sodium: 139 mmol/L (ref 135–145)
Total Bilirubin: 0.4 mg/dL (ref 0.3–1.2)
Total Protein: 7 g/dL (ref 6.5–8.1)

## 2020-08-01 LAB — D-DIMER, QUANTITATIVE: D-Dimer, Quant: 2.45 ug/mL-FEU — ABNORMAL HIGH (ref 0.00–0.50)

## 2020-08-01 LAB — C-REACTIVE PROTEIN: CRP: 3.9 mg/dL — ABNORMAL HIGH (ref <1.0)

## 2020-08-01 MED ORDER — PREDNISONE 50 MG PO TABS: 50.0000 mg | ORAL_TABLET | Freq: Every day | ORAL | 0 refills | Status: DC

## 2020-08-01 MED ORDER — GUAIFENESIN-DM 100-10 MG/5ML PO SYRP: 10.0000 mL | ORAL_SOLUTION | ORAL | 0 refills | Status: DC | PRN

## 2020-08-01 MED ORDER — ALBUTEROL SULFATE HFA 108 (90 BASE) MCG/ACT IN AERS: 2.0000 | INHALATION_SPRAY | Freq: Three times a day (TID) | RESPIRATORY_TRACT | 0 refills | Status: DC

## 2020-08-01 MED ORDER — ENOXAPARIN SODIUM 60 MG/0.6ML ~~LOC~~ SOLN
55.0000 mg | SUBCUTANEOUS | Status: DC
  Administered 2020-08-01: 55 mg via SUBCUTANEOUS
  Filled 2020-08-01: qty 0.6

## 2020-08-01 MED ORDER — ALBUTEROL SULFATE HFA 108 (90 BASE) MCG/ACT IN AERS
2.0000 | INHALATION_SPRAY | Freq: Three times a day (TID) | RESPIRATORY_TRACT | Status: DC
  Administered 2020-08-01 (×2): 2 via RESPIRATORY_TRACT

## 2020-08-01 NOTE — Discharge Summary (Signed)
Physician Discharge Summary  Claudia [name] [id number] DOB: 21-Jun-1956 DOA: 07/31/2020  PCP: Pcp, No  Admit date: 07/31/2020 Discharge date: 08/01/2020  Admitted From: home Disposition:  Home   Recommendations for Outpatient Follow-up:  1. Follow up with PCP in 1-2 weeks 2. Please obtain BMP/CBC in one week 3. Patient has been set up with outpatient remdesivir 4.  Home Health: Equipment/Devices:oxygen at 3L  Discharge Condition:stable CODE STATUS:full code Diet recommendation: heart healthy  Brief/Interim Summary: HPI: Claudia [name] is a 65 y.o. female with medical history significant of stage IV breast cancer, obesity, hypertension, presents to the hospital with cough and shortness of breath.  She reports that she noticed a change in her chronic cough whereas it became more productive over the past week.  Since yesterday, she has had worsening shortness of breath and dyspnea on exertion.  She reports being febrile over the last several days.  She was seen in the emergency room approximately a week ago for left arm cellulitis and was prescribed a course of Bactrim.  She reports taking her antibiotic with improvement of her cellulitis.  Clinically, she had improved until she started to feel sick again.  She reports of several of her grandchildren also tested positive for Covid.  She is unvaccinated.  Discharge Diagnoses:  Active Problems:   Anxiety   History of breast cancer   Acute respiratory failure with hypoxia (HCC)   Pneumonia due to COVID-19 virus   AKI (acute kidney injury) (HCC)   Obesity, Class III, BMI 40-49.9 (morbid obesity) ([place])  Acute respiratory failure with hypoxia -Secondary to COVID-19 pneumonia -Respiratory status has been stable on 3 L of oxygen -She will be discharged home with supplemental oxygen  COVID-19 pneumonia -Inflammatory markers are mildly elevated -Started on remdesivir and intravenous steroids -Encourage pulmonary hygiene/prone  position -Clinically she has improved -Transition to prednisone taper -Remainder doses of remdesivir-Seroquel outpatient  Acute kidney injury -Likely some degree of hypovolemia -Renal function improved with  Left forearm cellulitis -She has been taking Bactrim for this -We will discontinue further Bactrim and substitute for doxycycline in light of elevated creatinine -Appears to be improved.  Follow-up with primary care physician  Class III obesity, BMI 42 -This place patient is at high high risk for decompensation with Covid  History of stage IV breast cancer -Continue anastrozole  Anxiety -Continue as needed benzodiazepines  Discharge Instructions  Discharge Instructions    Diet - low sodium heart healthy   Complete by: As directed    Increase activity slowly   Complete by: As directed      Allergies as of 08/01/2020      Reactions   Statins Other (See Comments)   Muscle pain      Medication List    STOP taking these medications   acetaminophen-codeine 300-30 MG tablet Commonly known as: TYLENOL #3   amoxicillin-clavulanate 875-125 MG tablet Commonly known as: AUGMENTIN   hydrocortisone 2.5 % cream   methocarbamol 500 MG tablet Commonly known as: ROBAXIN   oxyCODONE-acetaminophen 5-325 MG tablet Commonly known as: PERCOCET/ROXICET   sulfamethoxazole-trimethoprim 800-160 MG tablet Commonly known as: BACTRIM DS     TAKE these medications   albuterol 108 (90 Base) MCG/ACT inhaler Commonly known as: VENTOLIN HFA Inhale 2 puffs into the lungs 3 (three) times daily.   alprazolam 2 MG tablet Commonly known as: XANAX Take 2 mg by mouth 4 (four) times daily.   amLODipine-benazepril 10-20 MG capsule Commonly known as: LOTREL Take 1 capsule by  mouth daily.   anastrozole 1 MG tablet Commonly known as: ARIMIDEX Take 1 tablet by mouth daily.   cetirizine 10 MG tablet Commonly known as: ZYRTEC Take 10 mg by mouth daily as needed for allergies.    ezetimibe 10 MG tablet Commonly known as: ZETIA Take 1 tablet (10 mg total) by mouth daily.   gabapentin 100 MG capsule Commonly known as: NEURONTIN Take 200 mg by mouth 3 (three) times daily.   guaiFENesin-dextromethorphan 100-10 MG/5ML syrup Commonly known as: ROBITUSSIN DM Take 10 mLs by mouth every 4 (four) hours as needed for cough.   HYDROcodone-acetaminophen 5-325 MG tablet Commonly known as: Norco Take 1 tablet by mouth at bedtime as needed for moderate pain.   predniSONE 50 MG tablet Commonly known as: DELTASONE Take 1 tablet (50 mg total) by mouth daily. Start taking on: August 04, 2020   sertraline 100 MG tablet Commonly known as: ZOLOFT Take 250 mg by mouth daily.            Durable Medical Equipment  (From admission, onward)         Start     Ordered   08/01/20 1258  For home use only DME oxygen  Once       Question Answer Comment  Length of Need 6 Months   Mode or (Route) Nasal cannula   Liters per Minute 3   Frequency Continuous (stationary and portable oxygen unit needed)   Oxygen conserving device Yes   Oxygen delivery system Gas      08/01/20 1257          Allergies  Allergen Reactions  . Statins Other (See Comments)    Muscle pain    Consultations:     Procedures/Studies: CT Angio Chest PE W/Cm &/Or Wo Cm  Result Date: 07/31/2020 CLINICAL DATA:  COVID positive. Breast cancer with prior plans to start chemotherapy today. EXAM: CT ANGIOGRAPHY CHEST WITH CONTRAST TECHNIQUE: Multidetector CT imaging of the chest was performed using the standard protocol during bolus administration of intravenous contrast. Multiplanar CT image reconstructions and MIPs were obtained to evaluate the vascular anatomy. CONTRAST:  12mL OMNIPAQUE IOHEXOL 350 MG/ML SOLN COMPARISON:  11/13/2015.  Outside head CT report 05/07/2020 FINDINGS: Cardiovascular: Cardiomegaly. No pericardial effusion. Aortic and coronary atherosclerotic calcifications. Satisfactory  opacification of the pulmonary arteries to at least the segmental level with no convincing pulmonary filling defect when allowing for intermittent streak and motion artifact, motion especially affecting the upper lobes. Mediastinum/Nodes: Negative for adenopathy or mass in the mediastinum. Negative esophagus. There has been right axillary dissection with no axillary adenopathy. 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 Lungs/Pleura: Patchy ground-glass opacity in the bilateral lungs consistent with COVID pneumonia. There is a degree of bronchomalacia with intermittent airway narrowing. Per outside head CT 05/07/2020 there is pulmonary nodularity which is not assessed in this setting. Upper Abdomen: Negative Musculoskeletal: Trabecular coarsening in the medial right clavicle which is stable from 2017. small sclerotic focus in the upper and posterior sternum, a sternal sclerotic foci was described on outside PET-CT. Review of the MIP images confirms the above findings. IMPRESSION: 1. COVID pattern pneumonia. 2. No evidence of pulmonary embolism. 3. Cardiomegaly. Electronically Signed   By: Monte Fantasia M.D.   On: 07/31/2020 07:22   DG Chest Port 1 View  Result Date: 07/31/2020 CLINICAL DATA:  COVID.  Hypoxia. EXAM: PORTABLE CHEST 1 VIEW COMPARISON:  09/13/2017. FINDINGS: Port-A-Cath noted with tip over right atrium. Heart size normal. Lung volumes. Diffuse bilateral  interstitial infiltrates are noted. Findings consistent pneumonitis. No pleural effusion or pneumothorax. Degenerative changes scoliosis thoracic spine. Degenerative changes both shoulders. No acute bony abnormality. Surgical clips right chest. IMPRESSION: 1. Port-A-Cath noted with tip over right atrium. 2. Low lung volumes. Diffuse bilateral interstitial infiltrates. Findings consistent with pneumonitis. Electronically Signed   By: Maisie Fus  Register   On: 07/31/2020 05:19       Subjective: Feeling better. Shortness of breath  improving. Cough improving  Discharge Exam: Vitals:   08/01/20 0520 08/01/20 0824 08/01/20 1406 08/01/20 1958  BP: (!) 150/83  (!) 150/98 (!) 153/88  Pulse: 95  (!) 104 (!) 105  Resp: 18  18 18   Temp: 98 F (36.7 C)  98.1 F (36.7 C) 98.4 F (36.9 C)  TempSrc: Oral  Oral Oral  SpO2: 95% 96% 92% 91%  Weight:      Height:        General: Pt is alert, awake, not in acute distress Cardiovascular: RRR, S1/S2 +, no rubs, no gallops Respiratory: CTA bilaterally, no wheezing, no rhonchi Abdominal: Soft, NT, ND, bowel sounds + Extremities: no edema, no cyanosis    The results of significant diagnostics from this hospitalization (including imaging, microbiology, ancillary and laboratory) are listed below for reference.     Microbiology: Recent Results (from the past 240 hour(s))  Blood Culture (routine x 2)     Status: None (Preliminary result)   Collection Time: 07/31/20  5:20 AM   Specimen: BLOOD  Result Value Ref Range Status   Specimen Description BLOOD RIGHT ANTECUBITAL  Final   Special Requests   Final    BOTTLES DRAWN AEROBIC AND ANAEROBIC Blood Culture adequate volume   Culture   Final    NO GROWTH 1 DAY Performed at [facility], [address] [place], [place]    Report Status PENDING  Incomplete  Blood Culture (routine x 2)     Status: None (Preliminary result)   Collection Time: 07/31/20  5:51 AM   Specimen: BLOOD  Result Value Ref Range Status   Specimen Description BLOOD LEFT HAND  Final   Special Requests   Final    BOTTLES DRAWN AEROBIC AND ANAEROBIC Blood Culture adequate volume   Culture   Final    NO GROWTH 1 DAY Performed at [facility], [address], [place], [place]    Report Status PENDING  Incomplete  Resp Panel by RT-PCR (Flu A&B, Covid) Nasopharyngeal Swab     Status: Abnormal   Collection Time: 07/31/20  6:34 AM   Specimen: Nasopharyngeal Swab; Nasopharyngeal(NP) swabs in vial transport medium  Result Value Ref Range  Status   SARS Coronavirus 2 by RT PCR POSITIVE (A) NEGATIVE Final    Comment: RESULT CALLED TO, READ BACK BY AND VERIFIED WITH: DOSS,M@0938  by MATTHEWS,B 1.5.22 (NOTE) SARS-CoV-2 target nucleic acids are DETECTED.  The SARS-CoV-2 RNA is generally detectable in upper respiratory specimens during the acute phase of infection. Positive results are indicative of the presence of the identified virus, but do not rule out bacterial infection or co-infection with other pathogens not detected by the test. Clinical correlation with patient history and other diagnostic information is necessary to determine patient infection status. The expected result is Negative.  Fact Sheet for Patients: 3.5.22  Fact Sheet for Healthcare Providers: BloggerCourse.com  This test is not yet approved or cleared by the [place] FDA and  has been authorized for detection and/or diagnosis of SARS-CoV-2 by FDA under an Emergency Use  Authorization (EUA).  This EUA will remain in effect (meaning this test can be  used) for the duration of  the COVID-19 declaration under Section 564(b)(1) of the Act, 21 U.S.C. section 360bbb-3(b)(1), unless the authorization is terminated or revoked sooner.     Influenza A by PCR NEGATIVE NEGATIVE Final   Influenza B by PCR NEGATIVE NEGATIVE Final    Comment: (NOTE) The Xpert Xpress SARS-CoV-2/FLU/RSV plus assay is intended as an aid in the diagnosis of influenza from Nasopharyngeal swab specimens and should not be used as a sole basis for treatment. Nasal washings and aspirates are unacceptable for Xpert Xpress SARS-CoV-2/FLU/RSV testing.  Fact Sheet for Patients: EntrepreneurPulse.com.au  Fact Sheet for Healthcare Providers: IncredibleEmployment.be  This test is not yet approved or cleared by the [place] FDA and has been authorized for detection and/or diagnosis of  SARS-CoV-2 by FDA under an Emergency Use Authorization (EUA). This EUA will remain in effect (meaning this test can be used) for the duration of the COVID-19 declaration under Section 564(b)(1) of the Act, 21 U.S.C. section 360bbb-3(b)(1), unless the authorization is terminated or revoked.  Performed at [facility], [address], [place], [place] [address]      Labs: BNP (last 3 results) No results for input(s): BNP in the last 8760 hours. Basic Metabolic Panel: Recent Labs  Lab 07/31/20 0520 08/01/20 0727  NA 135 139  K 3.6 4.8  CL 106 109  CO2 22 22  GLUCOSE 104* 161*  BUN 13 11  CREATININE 1.32* 0.62  CALCIUM 8.4* 8.7*  MG 2.2  --    Liver Function Tests: Recent Labs  Lab 07/31/20 0520 08/01/20 0727  AST 91* 40  ALT 38 36  ALKPHOS 88 76  BILITOT 0.4 0.4  PROT 7.0 7.0  ALBUMIN 3.3* 3.2*   No results for input(s): LIPASE, AMYLASE in the last 168 hours. No results for input(s): AMMONIA in the last 168 hours. CBC: Recent Labs  Lab 07/31/20 0520 08/01/20 0727  WBC 5.4 3.4*  NEUTROABS 4.3 2.6  HGB 11.6* 11.1*  HCT 37.0 36.7  MCV 91.4 92.4  PLT 151 155   Cardiac Enzymes: No results for input(s): CKTOTAL, CKMB, CKMBINDEX, TROPONINI in the last 168 hours. BNP: Invalid input(s): POCBNP CBG: No results for input(s): GLUCAP in the last 168 hours. D-Dimer Recent Labs    07/31/20 0520 08/01/20 0727  DDIMER 2.52* 2.45*   Hgb A1c No results for input(s): HGBA1C in the last 72 hours. Lipid Profile Recent Labs    07/31/20 0520  TRIG 70   Thyroid function studies No results for input(s): TSH, T4TOTAL, T3FREE, THYROIDAB in the last 72 hours.  Invalid input(s): FREET3 Anemia work up Recent Labs    07/31/20 0520 08/01/20 0727  FERRITIN 268 319*   Urinalysis    Component Value Date/Time   COLORURINE AMBER (A) 11/16/2019 0221   APPEARANCEUR HAZY (A) 11/16/2019 0221   LABSPEC 1.030 11/16/2019 0221   PHURINE 5.0 11/16/2019 0221   GLUCOSEU  NEGATIVE 11/16/2019 0221   HGBUR NEGATIVE 11/16/2019 0221   BILIRUBINUR NEGATIVE 11/16/2019 0221   KETONESUR NEGATIVE 11/16/2019 0221   PROTEINUR NEGATIVE 11/16/2019 0221   UROBILINOGEN 0.2 05/24/2014 0830   NITRITE NEGATIVE 11/16/2019 0221   LEUKOCYTESUR TRACE (A) 11/16/2019 0221   Sepsis Labs Invalid input(s): PROCALCITONIN,  WBC,  LACTICIDVEN Microbiology Recent Results (from the past 240 hour(s))  Blood Culture (routine x 2)     Status: None (Preliminary result)   Collection Time: 07/31/20  5:20  AM   Specimen: BLOOD  Result Value Ref Range Status   Specimen Description BLOOD RIGHT ANTECUBITAL  Final   Special Requests   Final    BOTTLES DRAWN AEROBIC AND ANAEROBIC Blood Culture adequate volume   Culture   Final    NO GROWTH 1 DAY Performed at [facility], [address], [place], [place] [address]    Report Status PENDING  Incomplete  Blood Culture (routine x 2)     Status: None (Preliminary result)   Collection Time: 07/31/20  5:51 AM   Specimen: BLOOD  Result Value Ref Range Status   Specimen Description BLOOD LEFT HAND  Final   Special Requests   Final    BOTTLES DRAWN AEROBIC AND ANAEROBIC Blood Culture adequate volume   Culture   Final    NO GROWTH 1 DAY Performed at [facility], [address] [place], [place] [address]    Report Status PENDING  Incomplete  Resp Panel by RT-PCR (Flu A&B, Covid) Nasopharyngeal Swab     Status: Abnormal   Collection Time: 07/31/20  6:34 AM   Specimen: Nasopharyngeal Swab; Nasopharyngeal(NP) swabs in vial transport medium  Result Value Ref Range Status   SARS Coronavirus 2 by RT PCR POSITIVE (A) NEGATIVE Final    Comment: RESULT CALLED TO, READ BACK BY AND VERIFIED WITH: DOSS,M@0938  by MATTHEWS,B 1.5.22 (NOTE) SARS-CoV-2 target nucleic acids are DETECTED.  The SARS-CoV-2 RNA is generally detectable in upper respiratory specimens during the acute phase of infection. Positive results are indicative of the presence of the  identified virus, but do not rule out bacterial infection or co-infection with other pathogens not detected by the test. Clinical correlation with patient history and other diagnostic information is necessary to determine patient infection status. The expected result is Negative.  Fact Sheet for Patients: EntrepreneurPulse.com.au  Fact Sheet for Healthcare Providers: IncredibleEmployment.be  This test is not yet approved or cleared by the [place] FDA and  has been authorized for detection and/or diagnosis of SARS-CoV-2 by FDA under an Emergency Use Authorization (EUA).  This EUA will remain in effect (meaning this test can be  used) for the duration of  the COVID-19 declaration under Section 564(b)(1) of the Act, 21 U.S.C. section 360bbb-3(b)(1), unless the authorization is terminated or revoked sooner.     Influenza A by PCR NEGATIVE NEGATIVE Final   Influenza B by PCR NEGATIVE NEGATIVE Final    Comment: (NOTE) The Xpert Xpress SARS-CoV-2/FLU/RSV plus assay is intended as an aid in the diagnosis of influenza from Nasopharyngeal swab specimens and should not be used as a sole basis for treatment. Nasal washings and aspirates are unacceptable for Xpert Xpress SARS-CoV-2/FLU/RSV testing.  Fact Sheet for Patients: EntrepreneurPulse.com.au  Fact Sheet for Healthcare Providers: IncredibleEmployment.be  This test is not yet approved or cleared by the [place] FDA and has been authorized for detection and/or diagnosis of SARS-CoV-2 by FDA under an Emergency Use Authorization (EUA). This EUA will remain in effect (meaning this test can be used) for the duration of the COVID-19 declaration under Section 564(b)(1) of the Act, 21 U.S.C. section 360bbb-3(b)(1), unless the authorization is terminated or revoked.  Performed at [facility], [address], [place], [place] [address]      Time  coordinating discharge: 16mins  SIGNED:   Kathie Dike, MD  [facility] 08/01/2020, 11:32 PM   If 7PM-7AM, please contact night-coverage [email or web address]

## 2020-08-01 NOTE — TOC Initial Note (Signed)
Transition of Care [facility]) - Initial/Assessment Note    Patient Details  Name: Claudia [name] MRN: [id number] Date of Birth: 07-Jan-1956  Transition of Care [facility]) CM/SW Contact:    Annice Needy, LCSW Phone Number: 08/01/2020, 1:49 PM  Clinical Narrative:                 Patient from home with son and two grandchildren. Admitted COVID+. Needs home oxygen. DME providers discussed. Referral made to [facility] with [employer].   Expected Discharge Plan: Home/Self Care Barriers to Discharge: Continued Medical Work up   Patient Goals and CMS Choice Patient states their goals for this hospitalization and ongoing recovery are:: return home CMS Medicare.gov Compare Post Acute Care list provided to:: Patient    Expected Discharge Plan and Services Expected Discharge Plan: Home/Self Care     Post Acute Care Choice: Durable Medical Equipment Living arrangements for the past 2 months: Single Family Home                 DME Arranged: Oxygen DME Agency: [employer] Date DME Agency Contacted: 08/01/20 Time DME Agency Contacted: 1348 Representative spoke with at DME Agency: [facility]            Prior Living Arrangements/Services Living arrangements for the past 2 months: Single Family Home Lives with:: Adult Children,Relatives Patient language and need for interpreter reviewed:: Yes Do you feel safe going back to the place where you live?: Yes      Need for Family Participation in Patient Care: Yes (Comment) Care giver support system in place?: Yes (comment) Current home services: DME ([facility]) Criminal Activity/Legal Involvement Pertinent to Current Situation/Hospitalization: No - Comment as needed  Activities of Daily Living Home Assistive Devices/Equipment: None ADL Screening (condition at time of admission) Patient's cognitive ability adequate to safely complete daily activities?: Yes Is the patient deaf or have difficulty hearing?: No Does the patient have difficulty seeing, even when  wearing glasses/contacts?: No Does the patient have difficulty concentrating, remembering, or making decisions?: No Patient able to express need for assistance with ADLs?: Yes Does the patient have difficulty dressing or bathing?: No Independently performs ADLs?: Yes (appropriate for developmental age) Does the patient have difficulty walking or climbing stairs?: No Weakness of Legs: Both Weakness of Arms/Hands: None  Permission Sought/Granted                  Emotional Assessment Appearance:: Appears younger than stated age   Affect (typically observed): Appropriate Orientation: : Oriented to Self,Oriented to Place,Oriented to  Time,Oriented to Situation Alcohol / Substance Use: Not Applicable Psych Involvement: No (comment)  Admission diagnosis:  Hypoxia [R09.02] Acute respiratory failure with hypoxia (HCC) [J96.01] Hypotension, unspecified hypotension type [I95.9] Pneumonia due to COVID-19 virus [U07.1, J12.82] Patient Active Problem List   Diagnosis Date Noted  . Acute respiratory failure with hypoxia (HCC) 07/31/2020  . Pneumonia due to COVID-19 virus 07/31/2020  . AKI (acute kidney injury) (HCC) 07/31/2020  . Obesity, Class III, BMI 40-49.9 (morbid obesity) (HCC) 07/31/2020  . Presence of left artificial knee joint 09/30/2017  . Arthritis of left knee 09/20/2017  . Closed fracture of medial malleolus of left ankle with delayed healing 05/29/2014  . Closed trimalleolar fracture of left ankle 10/10/2013  . Thyroid nodule 10/23/2012  . Ataxia 10/22/2012  . Vertigo 10/22/2012  . History of breast cancer 10/22/2012  . Panic disorder 10/22/2012  . Depression 08/31/2012  . Anxiety 08/31/2012   PCP:  Pcp, No Pharmacy:   [facility] - [place], [place] - [address] Jerolyn Shin [place] [address] Phone: [phone] Fax: [phone]     Social Determinants of Health (SDOH) Interventions    Readmission Risk Interventions No flowsheet data  found.

## 2020-08-01 NOTE — Progress Notes (Signed)
Discharge instructions given to patient. Patient knows when to go to infusion center, Granddaughter took 3 portable O2 tanks to pt's house. All belongings sent with patient. Answered all questions at this time. Await on EMS at this time.

## 2020-08-01 NOTE — Plan of Care (Signed)
Problem: Education: Goal: Knowledge of General Education information will improve Description: Including pain rating scale, medication(s)/side effects and non-pharmacologic comfort measures 08/01/2020 1758 by Cameron Ali, RN Outcome: Completed/Met 08/01/2020 1048 by Cameron Ali, RN Outcome: Progressing   Problem: Health Behavior/Discharge Planning: Goal: Ability to manage health-related needs will improve 08/01/2020 1758 by Cameron Ali, RN Outcome: Completed/Met 08/01/2020 1048 by Cameron Ali, RN Outcome: Progressing   Problem: Clinical Measurements: Goal: Ability to maintain clinical measurements within normal limits will improve 08/01/2020 1758 by Cameron Ali, RN Outcome: Completed/Met 08/01/2020 1048 by Cameron Ali, RN Outcome: Progressing Goal: Will remain free from infection 08/01/2020 1758 by Cameron Ali, RN Outcome: Completed/Met 08/01/2020 1048 by Cameron Ali, RN Outcome: Progressing Goal: Diagnostic test results will improve 08/01/2020 1758 by Cameron Ali, RN Outcome: Completed/Met 08/01/2020 1048 by Cameron Ali, RN Outcome: Progressing Goal: Respiratory complications will improve 08/01/2020 1758 by Cameron Ali, RN Outcome: Completed/Met 08/01/2020 1048 by Cameron Ali, RN Outcome: Progressing Goal: Cardiovascular complication will be avoided 08/01/2020 1758 by Cameron Ali, RN Outcome: Completed/Met 08/01/2020 1048 by Cameron Ali, RN Outcome: Progressing   Problem: Activity: Goal: Risk for activity intolerance will decrease 08/01/2020 1758 by Cameron Ali, RN Outcome: Completed/Met 08/01/2020 1048 by Cameron Ali, RN Outcome: Progressing   Problem: Nutrition: Goal: Adequate nutrition will be maintained 08/01/2020 1758 by Cameron Ali, RN Outcome: Completed/Met 08/01/2020 1048 by Cameron Ali, RN Outcome: Progressing   Problem: Coping: Goal: Level of anxiety will decrease 08/01/2020  1758 by Cameron Ali, RN Outcome: Completed/Met 08/01/2020 1048 by Cameron Ali, RN Outcome: Progressing   Problem: Elimination: Goal: Will not experience complications related to bowel motility 08/01/2020 1758 by Cameron Ali, RN Outcome: Completed/Met 08/01/2020 1048 by Cameron Ali, RN Outcome: Progressing Goal: Will not experience complications related to urinary retention 08/01/2020 1758 by Cameron Ali, RN Outcome: Completed/Met 08/01/2020 1048 by Cameron Ali, RN Outcome: Progressing   Problem: Pain Managment: Goal: General experience of comfort will improve 08/01/2020 1758 by Cameron Ali, RN Outcome: Completed/Met 08/01/2020 1048 by Cameron Ali, RN Outcome: Progressing   Problem: Safety: Goal: Ability to remain free from injury will improve 08/01/2020 1758 by Cameron Ali, RN Outcome: Completed/Met 08/01/2020 1048 by Cameron Ali, RN Outcome: Progressing   Problem: Skin Integrity: Goal: Risk for impaired skin integrity will decrease 08/01/2020 1758 by Cameron Ali, RN Outcome: Completed/Met 08/01/2020 1048 by Cameron Ali, RN Outcome: Progressing   Problem: Education: Goal: Knowledge of General Education information will improve Description: Including pain rating scale, medication(s)/side effects and non-pharmacologic comfort measures 08/01/2020 1758 by Cameron Ali, RN Outcome: Completed/Met 08/01/2020 1048 by Cameron Ali, RN Outcome: Progressing   Problem: Health Behavior/Discharge Planning: Goal: Ability to manage health-related needs will improve 08/01/2020 1758 by Cameron Ali, RN Outcome: Completed/Met 08/01/2020 1048 by Cameron Ali, RN Outcome: Progressing   Problem: Clinical Measurements: Goal: Ability to maintain clinical measurements within normal limits will improve 08/01/2020 1758 by Cameron Ali, RN Outcome: Completed/Met 08/01/2020 1048 by Cameron Ali, RN Outcome:  Progressing Goal: Will remain free from infection 08/01/2020 1758 by Cameron Ali, RN Outcome: Completed/Met 08/01/2020 1048 by Cameron Ali, RN Outcome: Progressing Goal: Diagnostic test results will improve 08/01/2020 1758 by Cameron Ali, RN Outcome: Completed/Met 08/01/2020 1048 by Cameron Ali, RN Outcome: Progressing Goal: Respiratory complications will improve 08/01/2020 1758 by  Cameron Ali, RN Outcome: Completed/Met 08/01/2020 1048 by Cameron Ali, RN Outcome: Progressing Goal: Cardiovascular complication will be avoided 08/01/2020 1758 by Cameron Ali, RN Outcome: Completed/Met 08/01/2020 1048 by Cameron Ali, RN Outcome: Progressing   Problem: Activity: Goal: Risk for activity intolerance will decrease 08/01/2020 1758 by Cameron Ali, RN Outcome: Completed/Met 08/01/2020 1048 by Cameron Ali, RN Outcome: Progressing   Problem: Nutrition: Goal: Adequate nutrition will be maintained 08/01/2020 1758 by Cameron Ali, RN Outcome: Completed/Met 08/01/2020 1048 by Cameron Ali, RN Outcome: Progressing   Problem: Coping: Goal: Level of anxiety will decrease 08/01/2020 1758 by Cameron Ali, RN Outcome: Completed/Met 08/01/2020 1048 by Cameron Ali, RN Outcome: Progressing   Problem: Elimination: Goal: Will not experience complications related to bowel motility 08/01/2020 1758 by Cameron Ali, RN Outcome: Completed/Met 08/01/2020 1048 by Cameron Ali, RN Outcome: Progressing Goal: Will not experience complications related to urinary retention 08/01/2020 1758 by Cameron Ali, RN Outcome: Completed/Met 08/01/2020 1048 by Cameron Ali, RN Outcome: Progressing   Problem: Pain Managment: Goal: General experience of comfort will improve 08/01/2020 1758 by Cameron Ali, RN Outcome: Completed/Met 08/01/2020 1048 by Cameron Ali, RN Outcome: Progressing   Problem: Safety: Goal: Ability to remain free  from injury will improve 08/01/2020 1758 by Cameron Ali, RN Outcome: Completed/Met 08/01/2020 1048 by Cameron Ali, RN Outcome: Progressing   Problem: Skin Integrity: Goal: Risk for impaired skin integrity will decrease 08/01/2020 1758 by Cameron Ali, RN Outcome: Completed/Met 08/01/2020 1048 by Cameron Ali, RN Outcome: Progressing   Problem: Education: Goal: Knowledge of risk factors and measures for prevention of condition will improve 08/01/2020 1758 by Cameron Ali, RN Outcome: Completed/Met 08/01/2020 1048 by Cameron Ali, RN Outcome: Progressing   Problem: Coping: Goal: Psychosocial and spiritual needs will be supported 08/01/2020 1758 by Cameron Ali, RN Outcome: Completed/Met 08/01/2020 1048 by Cameron Ali, RN Outcome: Progressing   Problem: Respiratory: Goal: Will maintain a patent airway 08/01/2020 1758 by Cameron Ali, RN Outcome: Completed/Met 08/01/2020 1048 by Cameron Ali, RN Outcome: Progressing Goal: Complications related to the disease process, condition or treatment will be avoided or minimized 08/01/2020 1758 by Cameron Ali, RN Outcome: Completed/Met 08/01/2020 1048 by Cameron Ali, RN Outcome: Progressing

## 2020-08-01 NOTE — Plan of Care (Signed)
  Problem: Education: Goal: Knowledge of General Education information will improve Description: Including pain rating scale, medication(s)/side effects and non-pharmacologic comfort measures Outcome: Progressing   Problem: Health Behavior/Discharge Planning: Goal: Ability to manage health-related needs will improve Outcome: Progressing   Problem: Clinical Measurements: Goal: Ability to maintain clinical measurements within normal limits will improve Outcome: Progressing Goal: Will remain free from infection Outcome: Progressing Goal: Diagnostic test results will improve Outcome: Progressing Goal: Respiratory complications will improve Outcome: Progressing Goal: Cardiovascular complication will be avoided Outcome: Progressing   Problem: Activity: Goal: Risk for activity intolerance will decrease Outcome: Progressing   Problem: Nutrition: Goal: Adequate nutrition will be maintained Outcome: Progressing   Problem: Coping: Goal: Level of anxiety will decrease Outcome: Progressing   Problem: Elimination: Goal: Will not experience complications related to bowel motility Outcome: Progressing Goal: Will not experience complications related to urinary retention Outcome: Progressing   Problem: Pain Managment: Goal: General experience of comfort will improve Outcome: Progressing   Problem: Safety: Goal: Ability to remain free from injury will improve Outcome: Progressing   Problem: Skin Integrity: Goal: Risk for impaired skin integrity will decrease Outcome: Progressing   Problem: Education: Goal: Knowledge of General Education information will improve Description: Including pain rating scale, medication(s)/side effects and non-pharmacologic comfort measures Outcome: Progressing   Problem: Health Behavior/Discharge Planning: Goal: Ability to manage health-related needs will improve Outcome: Progressing   Problem: Clinical Measurements: Goal: Ability to maintain  clinical measurements within normal limits will improve Outcome: Progressing Goal: Will remain free from infection Outcome: Progressing Goal: Diagnostic test results will improve Outcome: Progressing Goal: Respiratory complications will improve Outcome: Progressing Goal: Cardiovascular complication will be avoided Outcome: Progressing   Problem: Activity: Goal: Risk for activity intolerance will decrease Outcome: Progressing   Problem: Nutrition: Goal: Adequate nutrition will be maintained Outcome: Progressing   Problem: Coping: Goal: Level of anxiety will decrease Outcome: Progressing   Problem: Elimination: Goal: Will not experience complications related to bowel motility Outcome: Progressing Goal: Will not experience complications related to urinary retention Outcome: Progressing   Problem: Pain Managment: Goal: General experience of comfort will improve Outcome: Progressing   Problem: Safety: Goal: Ability to remain free from injury will improve Outcome: Progressing   Problem: Skin Integrity: Goal: Risk for impaired skin integrity will decrease Outcome: Progressing   Problem: Education: Goal: Knowledge of risk factors and measures for prevention of condition will improve Outcome: Progressing   Problem: Coping: Goal: Psychosocial and spiritual needs will be supported Outcome: Progressing   Problem: Respiratory: Goal: Will maintain a patent airway Outcome: Progressing Goal: Complications related to the disease process, condition or treatment will be avoided or minimized Outcome: Progressing   Problem: Respiratory: Goal: Complications related to the disease process, condition or treatment will be avoided or minimized Outcome: Progressing

## 2020-08-01 NOTE — Progress Notes (Signed)
Patient scheduled for outpatient Remdesivir infusion at 4pm on Friday 1/7 at [facility]. Please inform the patient to park at [address] [place], [place], as staff will be escorting the patient through the east entrance of the hospital. Appointments take approximately 45 minutes.    There is a wave flag banner located near the entrance on [address]. Turn into this entrance and immediately turn left or right and park in 1 of the 10 designated Covid Infusion Parking spots. There is a phone number on the sign, please call and let the staff know what spot you are in and we will come out and get you. For questions call [phone].  Thanks.

## 2020-08-01 NOTE — Progress Notes (Signed)
SATURATION QUALIFICATIONS: (This note is used to comply with regulatory documentation for home oxygen)  Patient Saturations on Room Air at Rest = 91%  Patient Saturations on Room Air while Ambulating = 87%  Patient Saturations on 3 Liters of oxygen while Ambulating = 93%  Please briefly explain why patient needs home oxygen:

## 2020-08-01 NOTE — Discharge Instructions (Signed)
You will need to quarantine until 08/21/20     You are scheduled for an outpatient Remdesivir infusion at 4pm on Friday 1/7, the Saturday and Monday appointments will be scheduled at the clinic when you arrive tomorrow at [facility]. Please park at [address] [place], [place], as staff will be escorting you through the east entrance of the hospital. Appointments take approximately 45 minutes.    The address for the infusion clinic site is:  --GPS address is [address] - the parking is located near [employer] building where you will see  COVID19 Infusion feather banner marking the entrance to parking.   (see photos below)            --Enter into the 2nd entrance where the "wave, flag banner" is at the road. Turn into this 2nd entrance and immediately turn left to park in 1 of the 5 parking spots.   --Please stay in your car and call the desk for assistance inside [phone].   The day of your visit you should: Marland Kitchen Get plenty of rest the night before and drink plenty of water . Eat a light meal/snack before coming and take your medications as prescribed  . Wear warm, comfortable clothes with a shirt that can roll-up over the elbow (will need IV start).  . Wear a mask  . Consider bringing some activity to help pass the time

## 2020-08-02 ENCOUNTER — Ambulatory Visit (HOSPITAL_COMMUNITY)
Admit: 2020-08-02 | Discharge: 2020-08-02 | Disposition: A | Discharge status: Home / Self Care | Payer: Medicaid Other | Attending: Pulmonary Disease | Admitting: Pulmonary Disease

## 2020-08-02 DIAGNOSIS — U071 COVID-19: Secondary | ICD-10-CM | POA: Insufficient documentation

## 2020-08-02 DIAGNOSIS — J1289 Other viral pneumonia: Secondary | ICD-10-CM | POA: Diagnosis present

## 2020-08-02 MED ORDER — METHYLPREDNISOLONE SODIUM SUCC 125 MG IJ SOLR: 125.0000 mg | Freq: Once | INTRAMUSCULAR | Status: DC | PRN

## 2020-08-02 MED ORDER — SODIUM CHLORIDE 0.9 % IV SOLN: INTRAVENOUS | Status: DC | PRN

## 2020-08-02 MED ORDER — SODIUM CHLORIDE 0.9 % IV SOLN
100.0000 mg | Freq: Once | INTRAVENOUS | Status: AC
  Administered 2020-08-02: 100 mg via INTRAVENOUS

## 2020-08-02 MED ORDER — EPINEPHRINE 0.3 MG/0.3ML IJ SOAJ: 0.3000 mg | Freq: Once | INTRAMUSCULAR | Status: DC | PRN

## 2020-08-02 MED ORDER — ALBUTEROL SULFATE HFA 108 (90 BASE) MCG/ACT IN AERS: 2.0000 | INHALATION_SPRAY | Freq: Once | RESPIRATORY_TRACT | Status: DC | PRN

## 2020-08-02 MED ORDER — FAMOTIDINE IN NACL 20-0.9 MG/50ML-% IV SOLN: 20.0000 mg | Freq: Once | INTRAVENOUS | Status: DC | PRN

## 2020-08-02 MED ORDER — DIPHENHYDRAMINE HCL 50 MG/ML IJ SOLN: 50.0000 mg | Freq: Once | INTRAMUSCULAR | Status: DC | PRN

## 2020-08-02 NOTE — Progress Notes (Signed)
  Diagnosis: COVID-19  Physician: Dr. Wright  Procedure: Covid Infusion Clinic Med: remdesivir infusion - Provided patient with remdesivir fact sheet for patients, parents and caregivers prior to infusion.  Complications: No immediate complications noted.  Discharge: Discharged home   Claudia [name] Ann 08/02/2020    

## 2020-08-02 NOTE — Discharge Instructions (Signed)
10 Things You Can Do to Manage Your COVID-19 Symptoms at Home If you have possible or confirmed COVID-19: 1. Stay home from work and school. And stay away from other public places. If you must go out, avoid using any kind of public transportation, ridesharing, or taxis. 2. Monitor your symptoms carefully. If your symptoms get worse, call your healthcare provider immediately. 3. Get rest and stay hydrated. 4. If you have a medical appointment, call the healthcare provider ahead of time and tell them that you have or may have COVID-19. 5. For medical emergencies, call 911 and notify the dispatch personnel that you have or may have COVID-19. 6. Cover your cough and sneezes with a tissue or use the inside of your elbow. 7. Wash your hands often with soap and water for at least 20 seconds or clean your hands with an alcohol-based hand sanitizer that contains at least 60% alcohol. 8. As much as possible, stay in a specific room and away from other people in your home. Also, you should use a separate bathroom, if available. If you need to be around other people in or outside of the home, wear a mask. 9. Avoid sharing personal items with other people in your household, like dishes, towels, and bedding. 10. Clean all surfaces that are touched often, like counters, tabletops, and doorknobs. Use household cleaning sprays or wipes according to the label instructions. cdc.gov/coronavirus 01/25/2019 This information is not intended to replace advice given to you by your health care provider. Make sure you discuss any questions you have with your health care provider. Document Revised: 06/29/2019 Document Reviewed: 06/29/2019 Elsevier Patient Education  2020 [employer]  If you have any questions or concerns after the infusion please call the Advanced Practice Provider on call at [phone]. This number is ONLY intended for your use regarding questions or concerns about the infusion post-treatment  side-effects.  Please do not provide this number to others for use. For return to work notes please contact your primary care provider.   If someone you know is interested in receiving treatment please have them call the COVID hotline at [phone].    

## 2020-08-02 NOTE — Progress Notes (Signed)
Patient reviewed Fact Sheet for Patients, Parents, and Caregivers for Emergency Use Authorization (EUA) of remdesivir for the Treatment of Coronavirus. Patient also reviewed and is agreeable to the estimated cost of treatment. Patient is agreeable to proceed.    

## 2020-08-03 ENCOUNTER — Ambulatory Visit (HOSPITAL_COMMUNITY)
Admission: RE | Admit: 2020-08-03 | Discharge: 2020-08-03 | Disposition: A | Discharge status: Home / Self Care | Payer: Medicaid Other | Source: Ambulatory Visit | Attending: Pulmonary Disease | Admitting: Pulmonary Disease

## 2020-08-03 DIAGNOSIS — U071 COVID-19: Secondary | ICD-10-CM | POA: Insufficient documentation

## 2020-08-03 MED ORDER — EPINEPHRINE 0.3 MG/0.3ML IJ SOAJ: 0.3000 mg | Freq: Once | INTRAMUSCULAR | Status: DC | PRN

## 2020-08-03 MED ORDER — ALBUTEROL SULFATE HFA 108 (90 BASE) MCG/ACT IN AERS: 2.0000 | INHALATION_SPRAY | Freq: Once | RESPIRATORY_TRACT | Status: DC | PRN

## 2020-08-03 MED ORDER — METHYLPREDNISOLONE SODIUM SUCC 125 MG IJ SOLR: 125.0000 mg | Freq: Once | INTRAMUSCULAR | Status: DC | PRN

## 2020-08-03 MED ORDER — SODIUM CHLORIDE 0.9 % IV SOLN
100.0000 mg | Freq: Once | INTRAVENOUS | Status: AC
  Administered 2020-08-03: 100 mg via INTRAVENOUS

## 2020-08-03 MED ORDER — SODIUM CHLORIDE 0.9 % IV SOLN: INTRAVENOUS | Status: DC | PRN

## 2020-08-03 MED ORDER — DIPHENHYDRAMINE HCL 50 MG/ML IJ SOLN: 50.0000 mg | Freq: Once | INTRAMUSCULAR | Status: DC | PRN

## 2020-08-03 MED ORDER — FAMOTIDINE IN NACL 20-0.9 MG/50ML-% IV SOLN: 20.0000 mg | Freq: Once | INTRAVENOUS | Status: DC | PRN

## 2020-08-03 MED ORDER — SODIUM CHLORIDE 0.9 % IV SOLN: 100.0000 mg | Freq: Once | INTRAVENOUS | Status: DC

## 2020-08-03 NOTE — Progress Notes (Signed)
  Diagnosis: COVID-19  Physician: Dr. Asencion Noble  Procedure: Covid Infusion Clinic Med: remdesivir infusion - Provided patient with remdesivir fact sheet for patients, parents and caregivers prior to infusion.  Complications: No immediate complications noted.  Discharge: Discharged home   Claudia [name] 08/03/2020

## 2020-08-03 NOTE — Discharge Instructions (Signed)
10 Things You Can Do to Manage Your COVID-19 Symptoms at Home If you have possible or confirmed COVID-19: 1. Stay home from work and school. And stay away from other public places. If you must go out, avoid using any kind of public transportation, ridesharing, or taxis. 2. Monitor your symptoms carefully. If your symptoms get worse, call your healthcare provider immediately. 3. Get rest and stay hydrated. 4. If you have a medical appointment, call the healthcare provider ahead of time and tell them that you have or may have COVID-19. 5. For medical emergencies, call 911 and notify the dispatch personnel that you have or may have COVID-19. 6. Cover your cough and sneezes with a tissue or use the inside of your elbow. 7. Wash your hands often with soap and water for at least 20 seconds or clean your hands with an alcohol-based hand sanitizer that contains at least 60% alcohol. 8. As much as possible, stay in a specific room and away from other people in your home. Also, you should use a separate bathroom, if available. If you need to be around other people in or outside of the home, wear a mask. 9. Avoid sharing personal items with other people in your household, like dishes, towels, and bedding. 10. Clean all surfaces that are touched often, like counters, tabletops, and doorknobs. Use household cleaning sprays or wipes according to the label instructions. cdc.gov/coronavirus 01/25/2019 This information is not intended to replace advice given to you by your health care provider. Make sure you discuss any questions you have with your health care provider. Document Revised: 06/29/2019 Document Reviewed: 06/29/2019 Elsevier Patient Education  2020 [employer]  If you have any questions or concerns after the infusion please call the Advanced Practice Provider on call at [phone]. This number is ONLY intended for your use regarding questions or concerns about the infusion post-treatment  side-effects.  Please do not provide this number to others for use. For return to work notes please contact your primary care provider.   If someone you know is interested in receiving treatment please have them call the COVID hotline at [phone].    

## 2020-08-04 ENCOUNTER — Emergency Department (HOSPITAL_COMMUNITY): Payer: Medicaid Other

## 2020-08-04 ENCOUNTER — Encounter (HOSPITAL_COMMUNITY): Payer: Self-pay | Admitting: *Deleted

## 2020-08-04 ENCOUNTER — Other Ambulatory Visit: Payer: Self-pay

## 2020-08-04 ENCOUNTER — Inpatient Hospital Stay (HOSPITAL_COMMUNITY): Payer: Medicaid Other

## 2020-08-04 ENCOUNTER — Inpatient Hospital Stay (HOSPITAL_COMMUNITY)
Admission: EM | Admit: 2020-08-04 | Discharge: 2020-08-12 | DRG: 177 | Disposition: A | Discharge status: Home Health | Payer: Medicaid Other | Attending: Internal Medicine | Admitting: Internal Medicine

## 2020-08-04 DIAGNOSIS — E66813 Obesity, class 3: Secondary | ICD-10-CM | POA: Diagnosis present

## 2020-08-04 DIAGNOSIS — I10 Essential (primary) hypertension: Secondary | ICD-10-CM | POA: Diagnosis present

## 2020-08-04 DIAGNOSIS — C7800 Secondary malignant neoplasm of unspecified lung: Secondary | ICD-10-CM | POA: Diagnosis present

## 2020-08-04 DIAGNOSIS — E8809 Other disorders of plasma-protein metabolism, not elsewhere classified: Secondary | ICD-10-CM | POA: Diagnosis present

## 2020-08-04 DIAGNOSIS — Z96662 Presence of left artificial ankle joint: Secondary | ICD-10-CM | POA: Diagnosis present

## 2020-08-04 DIAGNOSIS — R7989 Other specified abnormal findings of blood chemistry: Secondary | ICD-10-CM | POA: Diagnosis present

## 2020-08-04 DIAGNOSIS — C50919 Malignant neoplasm of unspecified site of unspecified female breast: Secondary | ICD-10-CM | POA: Diagnosis not present

## 2020-08-04 DIAGNOSIS — J9600 Acute respiratory failure, unspecified whether with hypoxia or hypercapnia: Secondary | ICD-10-CM

## 2020-08-04 DIAGNOSIS — E785 Hyperlipidemia, unspecified: Secondary | ICD-10-CM | POA: Diagnosis present

## 2020-08-04 DIAGNOSIS — Z853 Personal history of malignant neoplasm of breast: Secondary | ICD-10-CM

## 2020-08-04 DIAGNOSIS — Z6841 Body Mass Index (BMI) 40.0 and over, adult: Secondary | ICD-10-CM | POA: Diagnosis not present

## 2020-08-04 DIAGNOSIS — Z8249 Family history of ischemic heart disease and other diseases of the circulatory system: Secondary | ICD-10-CM

## 2020-08-04 DIAGNOSIS — Z87891 Personal history of nicotine dependence: Secondary | ICD-10-CM

## 2020-08-04 DIAGNOSIS — Z96652 Presence of left artificial knee joint: Secondary | ICD-10-CM | POA: Diagnosis present

## 2020-08-04 DIAGNOSIS — L03114 Cellulitis of left upper limb: Secondary | ICD-10-CM | POA: Diagnosis present

## 2020-08-04 DIAGNOSIS — R0602 Shortness of breath: Secondary | ICD-10-CM

## 2020-08-04 DIAGNOSIS — Z90710 Acquired absence of both cervix and uterus: Secondary | ICD-10-CM

## 2020-08-04 DIAGNOSIS — U071 COVID-19: Principal | ICD-10-CM | POA: Diagnosis present

## 2020-08-04 DIAGNOSIS — Z9012 Acquired absence of left breast and nipple: Secondary | ICD-10-CM | POA: Diagnosis not present

## 2020-08-04 DIAGNOSIS — F32A Depression, unspecified: Secondary | ICD-10-CM | POA: Diagnosis present

## 2020-08-04 DIAGNOSIS — Z79899 Other long term (current) drug therapy: Secondary | ICD-10-CM | POA: Diagnosis not present

## 2020-08-04 DIAGNOSIS — J9601 Acute respiratory failure with hypoxia: Secondary | ICD-10-CM | POA: Diagnosis present

## 2020-08-04 DIAGNOSIS — K219 Gastro-esophageal reflux disease without esophagitis: Secondary | ICD-10-CM | POA: Diagnosis present

## 2020-08-04 DIAGNOSIS — F419 Anxiety disorder, unspecified: Secondary | ICD-10-CM | POA: Diagnosis present

## 2020-08-04 DIAGNOSIS — E876 Hypokalemia: Secondary | ICD-10-CM | POA: Diagnosis present

## 2020-08-04 DIAGNOSIS — Z79811 Long term (current) use of aromatase inhibitors: Secondary | ICD-10-CM

## 2020-08-04 DIAGNOSIS — E7800 Pure hypercholesterolemia, unspecified: Secondary | ICD-10-CM | POA: Diagnosis present

## 2020-08-04 DIAGNOSIS — J1282 Pneumonia due to coronavirus disease 2019: Secondary | ICD-10-CM | POA: Diagnosis present

## 2020-08-04 DIAGNOSIS — Z9049 Acquired absence of other specified parts of digestive tract: Secondary | ICD-10-CM | POA: Diagnosis not present

## 2020-08-04 LAB — MAGNESIUM: Magnesium: 1.9 mg/dL (ref 1.7–2.4)

## 2020-08-04 LAB — COMPREHENSIVE METABOLIC PANEL
ALT: 35 U/L (ref 0–44)
AST: 24 U/L (ref 15–41)
Albumin: 3 g/dL — ABNORMAL LOW (ref 3.5–5.0)
Alkaline Phosphatase: 74 U/L (ref 38–126)
Anion gap: 11 (ref 5–15)
BUN: 11 mg/dL (ref 8–23)
CO2: 25 mmol/L (ref 22–32)
Calcium: 8.7 mg/dL — ABNORMAL LOW (ref 8.9–10.3)
Chloride: 104 mmol/L (ref 98–111)
Creatinine, Ser: 0.67 mg/dL (ref 0.44–1.00)
GFR, Estimated: >60 mL/min (ref >60)
Glucose, Bld: 102 mg/dL — ABNORMAL HIGH (ref 70–99)
Potassium: 2.7 mmol/L — CL (ref 3.5–5.1)
Sodium: 140 mmol/L (ref 135–145)
Total Bilirubin: 0.9 mg/dL (ref 0.3–1.2)
Total Protein: 6.8 g/dL (ref 6.5–8.1)

## 2020-08-04 LAB — PROCALCITONIN: Procalcitonin: <0.1 ng/mL

## 2020-08-04 LAB — I-STAT ARTERIAL BLOOD GAS, ED
Acid-Base Excess: 3 mmol/L — ABNORMAL HIGH (ref 0.0–2.0)
Bicarbonate: 26.3 mmol/L (ref 20.0–28.0)
Calcium, Ion: 1.19 mmol/L (ref 1.15–1.40)
HCT: 32 % — ABNORMAL LOW (ref 36.0–46.0)
Hemoglobin: 10.9 g/dL — ABNORMAL LOW (ref 12.0–15.0)
O2 Saturation: 98 %
Potassium: 3.3 mmol/L — ABNORMAL LOW (ref 3.5–5.1)
Sodium: 138 mmol/L (ref 135–145)
TCO2: 27 mmol/L (ref 22–32)
pCO2 arterial: 34.5 mmHg (ref 32.0–48.0)
pH, Arterial: 7.49 — ABNORMAL HIGH (ref 7.350–7.450)
pO2, Arterial: 92 mmHg (ref 83.0–108.0)

## 2020-08-04 LAB — CBC WITH DIFFERENTIAL/PLATELET
Abs Immature Granulocytes: 0.05 10*3/uL (ref 0.00–0.07)
Basophils Absolute: 0 10*3/uL (ref 0.0–0.1)
Basophils Relative: 0 %
Eosinophils Absolute: 0 10*3/uL (ref 0.0–0.5)
Eosinophils Relative: 0 %
HCT: 36 % (ref 36.0–46.0)
Hemoglobin: 11.9 g/dL — ABNORMAL LOW (ref 12.0–15.0)
Immature Granulocytes: 1 %
Lymphocytes Relative: 13 %
Lymphs Abs: 1.4 10*3/uL (ref 0.7–4.0)
MCH: 28.7 pg (ref 26.0–34.0)
MCHC: 33.1 g/dL (ref 30.0–36.0)
MCV: 87 fL (ref 80.0–100.0)
Monocytes Absolute: 0.5 10*3/uL (ref 0.1–1.0)
Monocytes Relative: 5 %
Neutro Abs: 8.4 10*3/uL — ABNORMAL HIGH (ref 1.7–7.7)
Neutrophils Relative %: 81 %
Platelets: 202 10*3/uL (ref 150–400)
RBC: 4.14 MIL/uL (ref 3.87–5.11)
RDW: 14.7 % (ref 11.5–15.5)
WBC: 10.3 10*3/uL (ref 4.0–10.5)
nRBC: 0 % (ref 0.0–0.2)

## 2020-08-04 LAB — C DIFFICILE QUICK SCREEN W PCR REFLEX
C Diff antigen: NEGATIVE
C Diff interpretation: NOT DETECTED
C Diff toxin: NEGATIVE

## 2020-08-04 LAB — C-REACTIVE PROTEIN: CRP: 6.7 mg/dL — ABNORMAL HIGH (ref <1.0)

## 2020-08-04 LAB — D-DIMER, QUANTITATIVE: D-Dimer, Quant: 2.38 ug/mL-FEU — ABNORMAL HIGH (ref 0.00–0.50)

## 2020-08-04 MED ORDER — SODIUM CHLORIDE 0.9 % IV SOLN
100.0000 mg | Freq: Every day | INTRAVENOUS | Status: AC
  Administered 2020-08-04 – 2020-08-05 (×2): 100 mg via INTRAVENOUS
  Filled 2020-08-04 (×2): qty 2.5

## 2020-08-04 MED ORDER — ASCORBIC ACID 500 MG PO TABS
500.0000 mg | ORAL_TABLET | Freq: Every day | ORAL | Status: DC
  Administered 2020-08-04 – 2020-08-12 (×9): 500 mg via ORAL
  Filled 2020-08-04 (×9): qty 1

## 2020-08-04 MED ORDER — ALBUTEROL SULFATE HFA 108 (90 BASE) MCG/ACT IN AERS
2.0000 | INHALATION_SPRAY | Freq: Four times a day (QID) | RESPIRATORY_TRACT | Status: DC
  Administered 2020-08-04 – 2020-08-11 (×27): 2 via RESPIRATORY_TRACT
  Filled 2020-08-04 (×3): qty 6.7

## 2020-08-04 MED ORDER — ONDANSETRON HCL 4 MG PO TABS
4.0000 mg | ORAL_TABLET | Freq: Four times a day (QID) | ORAL | Status: DC | PRN
  Administered 2020-08-04 (×2): 4 mg via ORAL
  Filled 2020-08-04 (×2): qty 1

## 2020-08-04 MED ORDER — ALPRAZOLAM 0.25 MG PO TABS
2.0000 mg | ORAL_TABLET | Freq: Four times a day (QID) | ORAL | Status: DC | PRN
  Administered 2020-08-04 – 2020-08-05 (×4): 2 mg via ORAL
  Filled 2020-08-04 (×4): qty 8

## 2020-08-04 MED ORDER — SODIUM CHLORIDE 0.9 % IV SOLN: 100.0000 mg | Freq: Every day | INTRAVENOUS | Status: DC

## 2020-08-04 MED ORDER — HYDROCODONE-ACETAMINOPHEN 5-325 MG PO TABS: 1.0000 | ORAL_TABLET | Freq: Every evening | ORAL | Status: DC | PRN

## 2020-08-04 MED ORDER — GABAPENTIN 100 MG PO CAPS: 200.0000 mg | ORAL_CAPSULE | Freq: Three times a day (TID) | ORAL | Status: DC | PRN

## 2020-08-04 MED ORDER — EZETIMIBE 10 MG PO TABS
10.0000 mg | ORAL_TABLET | Freq: Every day | ORAL | Status: DC
  Administered 2020-08-04 – 2020-08-12 (×9): 10 mg via ORAL
  Filled 2020-08-04 (×9): qty 1

## 2020-08-04 MED ORDER — FAMOTIDINE 20 MG PO TABS
20.0000 mg | ORAL_TABLET | Freq: Two times a day (BID) | ORAL | Status: DC
  Administered 2020-08-04 – 2020-08-12 (×17): 20 mg via ORAL
  Filled 2020-08-04 (×17): qty 1

## 2020-08-04 MED ORDER — ANASTROZOLE 1 MG PO TABS
1.0000 mg | ORAL_TABLET | Freq: Every day | ORAL | Status: DC
  Administered 2020-08-04 – 2020-08-12 (×9): 1 mg via ORAL
  Filled 2020-08-04 (×9): qty 1

## 2020-08-04 MED ORDER — DEXAMETHASONE SODIUM PHOSPHATE 10 MG/ML IJ SOLN
6.0000 mg | Freq: Once | INTRAMUSCULAR | Status: AC
  Administered 2020-08-04: 6 mg via INTRAVENOUS
  Filled 2020-08-04: qty 1

## 2020-08-04 MED ORDER — SODIUM CHLORIDE 0.9% FLUSH
3.0000 mL | Freq: Two times a day (BID) | INTRAVENOUS | Status: DC
  Administered 2020-08-04 – 2020-08-09 (×10): 3 mL via INTRAVENOUS
  Administered 2020-08-10: 10 mL via INTRAVENOUS
  Administered 2020-08-10 – 2020-08-12 (×5): 3 mL via INTRAVENOUS

## 2020-08-04 MED ORDER — ENOXAPARIN SODIUM 60 MG/0.6ML ~~LOC~~ SOLN
60.0000 mg | SUBCUTANEOUS | Status: DC
  Administered 2020-08-04: 60 mg via SUBCUTANEOUS
  Filled 2020-08-04 (×2): qty 0.6

## 2020-08-04 MED ORDER — LABETALOL HCL 5 MG/ML IV SOLN: 10.0000 mg | INTRAVENOUS | Status: DC | PRN

## 2020-08-04 MED ORDER — LORATADINE 10 MG PO TABS
10.0000 mg | ORAL_TABLET | Freq: Every day | ORAL | Status: DC
  Administered 2020-08-04 – 2020-08-12 (×9): 10 mg via ORAL
  Filled 2020-08-04 (×9): qty 1

## 2020-08-04 MED ORDER — POTASSIUM CHLORIDE 10 MEQ/100ML IV SOLN
10.0000 meq | INTRAVENOUS | Status: AC
  Filled 2020-08-04 (×2): qty 100

## 2020-08-04 MED ORDER — SERTRALINE HCL 50 MG PO TABS
250.0000 mg | ORAL_TABLET | Freq: Every day | ORAL | Status: DC
  Administered 2020-08-04 – 2020-08-12 (×9): 250 mg via ORAL
  Filled 2020-08-04 (×3): qty 1
  Filled 2020-08-04 (×6): qty 2

## 2020-08-04 MED ORDER — DEXAMETHASONE SODIUM PHOSPHATE 10 MG/ML IJ SOLN: 6.0000 mg | INTRAMUSCULAR | Status: DC

## 2020-08-04 MED ORDER — CHLORHEXIDINE GLUCONATE CLOTH 2 % EX PADS
6.0000 | MEDICATED_PAD | Freq: Every day | CUTANEOUS | Status: DC
  Administered 2020-08-07 – 2020-08-11 (×3): 6 via TOPICAL

## 2020-08-04 MED ORDER — ENSURE ENLIVE PO LIQD
237.0000 mL | Freq: Two times a day (BID) | ORAL | Status: DC
  Administered 2020-08-04 – 2020-08-11 (×9): 237 mL via ORAL
  Filled 2020-08-04 (×5): qty 237

## 2020-08-04 MED ORDER — SODIUM CHLORIDE 0.9% FLUSH
10.0000 mL | INTRAVENOUS | Status: DC | PRN
Start: 2020-08-04 — End: 2020-08-05

## 2020-08-04 MED ORDER — IOHEXOL 350 MG/ML SOLN
75.0000 mL | Freq: Once | INTRAVENOUS | Status: AC | PRN
  Administered 2020-08-04: 62 mL via INTRAVENOUS

## 2020-08-04 MED ORDER — LOPERAMIDE HCL 2 MG PO CAPS
2.0000 mg | ORAL_CAPSULE | ORAL | Status: DC | PRN
  Administered 2020-08-04 – 2020-08-07 (×2): 2 mg via ORAL
  Filled 2020-08-04 (×2): qty 1

## 2020-08-04 MED ORDER — ACETAMINOPHEN 325 MG PO TABS: 650.0000 mg | ORAL_TABLET | Freq: Four times a day (QID) | ORAL | Status: DC | PRN

## 2020-08-04 MED ORDER — SODIUM CHLORIDE 0.9 % IV SOLN
200.0000 mg | Freq: Once | INTRAVENOUS | Status: DC
  Filled 2020-08-04: qty 40

## 2020-08-04 MED ORDER — GUAIFENESIN-DM 100-10 MG/5ML PO SYRP
10.0000 mL | ORAL_SOLUTION | ORAL | Status: DC | PRN
  Administered 2020-08-06 – 2020-08-09 (×6): 10 mL via ORAL
  Filled 2020-08-04 (×8): qty 10

## 2020-08-04 MED ORDER — BARICITINIB 2 MG PO TABS
4.0000 mg | ORAL_TABLET | Freq: Every day | ORAL | Status: DC
  Administered 2020-08-04 – 2020-08-12 (×9): 4 mg via ORAL
  Filled 2020-08-04 (×9): qty 2

## 2020-08-04 MED ORDER — ONDANSETRON HCL 4 MG/2ML IJ SOLN
4.0000 mg | Freq: Four times a day (QID) | INTRAMUSCULAR | Status: DC | PRN
  Administered 2020-08-11: 4 mg via INTRAVENOUS
  Filled 2020-08-04 (×2): qty 2

## 2020-08-04 MED ORDER — POTASSIUM CHLORIDE CRYS ER 20 MEQ PO TBCR
40.0000 meq | EXTENDED_RELEASE_TABLET | Freq: Once | ORAL | Status: AC
  Administered 2020-08-04: 40 meq via ORAL
  Filled 2020-08-04: qty 2

## 2020-08-04 MED ORDER — PREDNISONE 20 MG PO TABS: 50.0000 mg | ORAL_TABLET | Freq: Every day | ORAL | Status: DC

## 2020-08-04 MED ORDER — HYDROCOD POLST-CPM POLST ER 10-8 MG/5ML PO SUER
5.0000 mL | Freq: Two times a day (BID) | ORAL | Status: DC | PRN
  Administered 2020-08-06 – 2020-08-11 (×5): 5 mL via ORAL
  Filled 2020-08-04 (×5): qty 5

## 2020-08-04 MED ORDER — ZINC SULFATE 220 (50 ZN) MG PO CAPS
220.0000 mg | ORAL_CAPSULE | Freq: Every day | ORAL | Status: DC
  Administered 2020-08-04 – 2020-08-12 (×9): 220 mg via ORAL
  Filled 2020-08-04 (×9): qty 1

## 2020-08-04 NOTE — H&P (Addendum)
History and Physical    Claudia [name] VHQ:[id number] DOB: [birth year] DOA: 08/04/2020  Referring MD/NP/PA: Jennette Kettle, MD PCP: Pcp, No  Patient coming from: Home via EMS  Chief Complaint: Shortness of breath  I have personally briefly reviewed patient's old medical records in [place]   HPI: Claudia [name] is a 65 y.o. female with medical history significant of hypertension, hyperlipidemia, stage IV breast cancer, anxiety, depression, GERD, and morbid obesity presents back to the hospital with worsening shortness of breath.  Just recently hospitalized at [facility] 1/5-1/6 for acute respiratory failure secondary to pneumonia due to COVID-19.  She received 1 dose of remdesivir along with steroids while in hospital.  She had been discharged home on 3 L of oxygen and was able to receive 2 outpatient infusions of remdesivir.  Her cough is unchanged and she was just very weak.  Reported associated symptoms of diarrhea and had been feeling more anxious.  At home she is checking her pulse ox and noted that her oxygen was dipping into the 70s.  Cellulitis of her left arm is better.  ED Course: Upon admission into the emergency department patient was seen to be afebrile, with pulse 92-1 20, respirations 17-44, blood pressures 91/76 -148/77, and O2 saturations as low as 80% with improvement on 10 L of oxygen to 93%.  Labs significant for hemoglobin 11.9, potassium 2.7, albumin 3, CRP 6.7, procalcitonin <0.1, and D-dimer 2.38.  Chest x-ray showed bilateral Covid pneumonia with slight improvement in upper lung zones.  Patient had been given 6 mg of Decadron IV, potassium 40 mEq p.o., and 30 mEq IV.  TRH called to admit.  Review of Systems  Constitutional: Positive for malaise/fatigue. Negative for fever.  HENT: Negative for ear pain and hearing loss.   Eyes: Negative for photophobia and pain.  Respiratory: Positive for cough and shortness of breath.   Cardiovascular: Negative for  chest pain and leg swelling.  Gastrointestinal: Positive for diarrhea and nausea. Negative for vomiting.  Genitourinary: Negative for dysuria and hematuria.  Musculoskeletal: Negative for falls.  Neurological: Positive for weakness. Negative for focal weakness and loss of consciousness.  Psychiatric/Behavioral: Negative for substance abuse. The patient is nervous/anxious.     Past Medical History:  Diagnosis Date  . Anxiety   . Breast cancer ([place])   . Cancer of right breast ([place])    mastectomy  . Depression   . GERD (gastroesophageal reflux disease)   . Hiatal hernia   . Hypercholesterolemia   . Hypertension   . Irritable bowel   . PONV (postoperative nausea and vomiting)     Past Surgical History:  Procedure Laterality Date  . ABDOMINAL HYSTERECTOMY    . BREAST SURGERY     lumpectomy  . CHOLECYSTECTOMY    . FOOT SURGERY     bilateral spur removal  . HARDWARE REMOVAL Left 05/29/2014   Procedure: REMOVAL OF SCREW AND K-WIRE;  Surgeon: Sanjuana Kava, MD;  Location: [facility];  Service: Orthopedics;  Laterality: Left;  . JOINT REPLACEMENT Left 2017   Ankle  . MASTECTOMY    . ORIF ANKLE FRACTURE Left 10/10/2013   Procedure: OPEN REDUCTION INTERNAL FIXATION (ORIF) ANKLE FRACTURE;  Surgeon: Sanjuana Kava, MD;  Location: [facility];  Service: Orthopedics;  Laterality: Left;  . ORIF ANKLE FRACTURE Left 05/29/2014   Procedure: OPEN REDUCTION INTERNAL FIXATION (ORIF) ANKLE FRACTURE AND PLACEMENT OF BONE GRAFT MATERIAL;  Surgeon: Sanjuana Kava, MD;  Location: [facility];  Service: Orthopedics;  Laterality: Left;  . TONSILLECTOMY    . TOTAL ABDOMINAL HYSTERECTOMY W/ BILATERAL SALPINGOOPHORECTOMY    . TOTAL KNEE ARTHROPLASTY Left 09/20/2017   Procedure: LEFT TOTAL KNEE ARTHROPLASTY;  Surgeon: Marybelle Killings, MD;  Location: [facility];  Service: Orthopedics;  Laterality: Left;  . TUBAL LIGATION       reports that she quit smoking about 2 years ago. Her smoking use included cigarettes. She has a 10.00  pack-year smoking history. She has never used smokeless tobacco. She reports that she does not drink alcohol and does not use drugs.  Allergies  Allergen Reactions  . Statins Other (See Comments)    Muscle pain    Family History  Problem Relation Age of Onset  . Liver disease Mother   . Hypertension Father   . Heart attack Sister     Prior to Admission medications   Medication Sig Start Date End Date Taking? Authorizing Provider  albuterol (VENTOLIN HFA) 108 (90 Base) MCG/ACT inhaler Inhale 2 puffs into the lungs 3 (three) times daily. 08/01/20  Yes Kathie Dike, MD  alprazolam Duanne Moron) 2 MG tablet Take 2 mg by mouth 4 (four) times daily.   Yes [provider]  amLODipine-benazepril (LOTREL) 10-20 MG capsule Take 1 capsule by mouth daily.   Yes [provider]  anastrozole (ARIMIDEX) 1 MG tablet Take 1 tablet by mouth daily. 07/17/20  Yes [provider]  cetirizine (ZYRTEC) 10 MG tablet Take 10 mg by mouth daily.   Yes [provider]  ezetimibe (ZETIA) 10 MG tablet Take 1 tablet (10 mg total) by mouth daily. 09/02/12  Yes Patrecia Pour, NP  gabapentin (NEURONTIN) 100 MG capsule Take 200 mg by mouth 3 (three) times daily as needed (burning/tingling). 05/07/20  Yes [provider]  guaiFENesin-dextromethorphan (ROBITUSSIN DM) 100-10 MG/5ML syrup Take 10 mLs by mouth every 4 (four) hours as needed for cough. 08/01/20  Yes Kathie Dike, MD  HYDROcodone-acetaminophen (NORCO) 5-325 MG tablet Take 1 tablet by mouth at bedtime as needed for moderate pain. 10/14/17  Yes Marybelle Killings, MD  predniSONE (DELTASONE) 50 MG tablet Take 1 tablet (50 mg total) by mouth daily. 08/04/20  Yes Kathie Dike, MD  sertraline (ZOLOFT) 100 MG tablet Take 250 mg by mouth daily.  09/02/12  Yes Patrecia Pour, NP    Physical Exam:  Constitutional: Morbidly obese female who appears acutely ill Vitals:   08/04/20 0415 08/04/20 0445 08/04/20 0530 08/04/20 0600  BP:  138/85 (!) 148/77 (!) 115/96 (!) 143/83  Pulse: (!) 101 98 96 (!) 120  Resp: (!) 28 (!) 39 (!) 43 (!) 26  SpO2: 93% 90% 90% 93%  Weight:      Height:       Eyes: PERRL, lids and conjunctivae normal ENMT: Mucous membranes are dry posterior pharynx clear of any exudate or lesions.  Neck: normal, supple, no masses, no thyromegaly Respiratory: Tachypneic with decreased overall aeration.  Patient currently on 10 L of oxygen, able to speak in short sentences. Cardiovascular: Tachycardia, no murmurs / rubs / gallops. No extremity edema. 2+ pedal pulses. No carotid bruits.  Abdomen: no tenderness, no masses palpated. No hepatosplenomegaly. Bowel sounds positive.  Musculoskeletal: no clubbing / cyanosis. No joint deformity upper and lower extremities. Good ROM, no contractures. Normal muscle tone.  Skin: Healed scab with mild erythema surrounding, but no increased warmth of the left forearm Neurologic: CN 2-12 grossly intact.  Strength 5/5 in all 4.  Psychiatric: Normal judgment and insight.  Alert and oriented x 3.  Anxious mood.     Labs on Admission: I have personally reviewed following labs and imaging studies  CBC: Recent Labs  Lab 07/31/20 0520 08/01/20 0727 08/04/20 0230  WBC 5.4 3.4* 10.3  NEUTROABS 4.3 2.6 8.4*  HGB 11.6* 11.1* 11.9*  HCT 37.0 36.7 36.0  MCV 91.4 92.4 87.0  PLT 151 155 [id number]   Basic Metabolic Panel: Recent Labs  Lab 07/31/20 0520 08/01/20 0727 08/04/20 0230  NA 135 139 140  K 3.6 4.8 2.7*  CL 106 109 104  CO2 22 22 25   GLUCOSE 104* 161* 102*  BUN 13 11 11   CREATININE 1.32* 0.62 0.67  CALCIUM 8.4* 8.7* 8.7*  MG 2.2  --   --    GFR: Estimated Creatinine Clearance: 88.9 mL/min (by C-G formula based on SCr of 0.67 mg/dL). Liver Function Tests: Recent Labs  Lab 07/31/20 0520 08/01/20 0727 08/04/20 0230  AST 91* 40 24  ALT 38 36 35  ALKPHOS 88 76 74  BILITOT 0.4 0.4 0.9  PROT 7.0 7.0 6.8  ALBUMIN 3.3* 3.2* 3.0*   No results for input(s):  LIPASE, AMYLASE in the last 168 hours. No results for input(s): AMMONIA in the last 168 hours. Coagulation Profile: No results for input(s): INR, PROTIME in the last 168 hours. Cardiac Enzymes: No results for input(s): CKTOTAL, CKMB, CKMBINDEX, TROPONINI in the last 168 hours. BNP (last 3 results) No results for input(s): PROBNP in the last 8760 hours. HbA1C: No results for input(s): HGBA1C in the last 72 hours. CBG: No results for input(s): GLUCAP in the last 168 hours. Lipid Profile: No results for input(s): CHOL, HDL, LDLCALC, TRIG, CHOLHDL, LDLDIRECT in the last 72 hours. Thyroid Function Tests: No results for input(s): TSH, T4TOTAL, FREET4, T3FREE, THYROIDAB in the last 72 hours. Anemia Panel: Recent Labs    08/01/20 0727  FERRITIN 319*   Urine analysis:    Component Value Date/Time   COLORURINE AMBER (A) 11/16/2019 0221   APPEARANCEUR HAZY (A) 11/16/2019 0221   LABSPEC 1.030 11/16/2019 0221   PHURINE 5.0 11/16/2019 0221   GLUCOSEU NEGATIVE 11/16/2019 0221   HGBUR NEGATIVE 11/16/2019 0221   BILIRUBINUR NEGATIVE 11/16/2019 0221   KETONESUR NEGATIVE 11/16/2019 0221   PROTEINUR NEGATIVE 11/16/2019 0221   UROBILINOGEN 0.2 05/24/2014 0830   NITRITE NEGATIVE 11/16/2019 0221   LEUKOCYTESUR TRACE (A) 11/16/2019 0221   Sepsis Labs: Recent Results (from the past 240 hour(s))  Blood Culture (routine x 2)     Status: None (Preliminary result)   Collection Time: 07/31/20  5:20 AM   Specimen: BLOOD  Result Value Ref Range Status   Specimen Description BLOOD RIGHT ANTECUBITAL  Final   Special Requests   Final    BOTTLES DRAWN AEROBIC AND ANAEROBIC Blood Culture adequate volume   Culture   Final    NO GROWTH 3 DAYS Performed at [facility], [address] [place], [place] [address]    Report Status PENDING  Incomplete  Blood Culture (routine x 2)     Status: None (Preliminary result)   Collection Time: 07/31/20  5:51 AM   Specimen: BLOOD  Result Value Ref Range Status    Specimen Description BLOOD LEFT HAND  Final   Special Requests   Final    BOTTLES DRAWN AEROBIC AND ANAEROBIC Blood Culture adequate volume   Culture   Final    NO GROWTH 3 DAYS Performed at [facility], [address] [place], [place] [address]  Report Status PENDING  Incomplete  Resp Panel by RT-PCR (Flu A&B, Covid) Nasopharyngeal Swab     Status: Abnormal   Collection Time: 07/31/20  6:34 AM   Specimen: Nasopharyngeal Swab; Nasopharyngeal(NP) swabs in vial transport medium  Result Value Ref Range Status   SARS Coronavirus 2 by RT PCR POSITIVE (A) NEGATIVE Final    Comment: RESULT CALLED TO, READ BACK BY AND VERIFIED WITH: DOSS,M@0938  by MATTHEWS,B 1.5.22 (NOTE) SARS-CoV-2 target nucleic acids are DETECTED.  The SARS-CoV-2 RNA is generally detectable in upper respiratory specimens during the acute phase of infection. Positive results are indicative of the presence of the identified virus, but do not rule out bacterial infection or co-infection with other pathogens not detected by the test. Clinical correlation with patient history and other diagnostic information is necessary to determine patient infection status. The expected result is Negative.  Fact Sheet for Patients: EntrepreneurPulse.com.au  Fact Sheet for Healthcare Providers: IncredibleEmployment.be  This test is not yet approved or cleared by the [place] FDA and  has been authorized for detection and/or diagnosis of SARS-CoV-2 by FDA under an Emergency Use Authorization (EUA).  This EUA will remain in effect (meaning this test can be  used) for the duration of  the COVID-19 declaration under Section 564(b)(1) of the Act, 21 U.S.C. section 360bbb-3(b)(1), unless the authorization is terminated or revoked sooner.     Influenza A by PCR NEGATIVE NEGATIVE Final   Influenza B by PCR NEGATIVE NEGATIVE Final    Comment: (NOTE) The Xpert Xpress SARS-CoV-2/FLU/RSV plus assay is  intended as an aid in the diagnosis of influenza from Nasopharyngeal swab specimens and should not be used as a sole basis for treatment. Nasal washings and aspirates are unacceptable for Xpert Xpress SARS-CoV-2/FLU/RSV testing.  Fact Sheet for Patients: EntrepreneurPulse.com.au  Fact Sheet for Healthcare Providers: IncredibleEmployment.be  This test is not yet approved or cleared by the [place] FDA and has been authorized for detection and/or diagnosis of SARS-CoV-2 by FDA under an Emergency Use Authorization (EUA). This EUA will remain in effect (meaning this test can be used) for the duration of the COVID-19 declaration under Section 564(b)(1) of the Act, 21 U.S.C. section 360bbb-3(b)(1), unless the authorization is terminated or revoked.  Performed at [facility], [address] [place], [place] [address]      Radiological Exams on Admission: DG Chest [facility] 1 View  Result Date: 08/04/2020 CLINICAL DATA:  Shortness of breath.  COVID positive. EXAM: PORTABLE CHEST 1 VIEW COMPARISON:  Radiographs and chest CTA 07/31/2020 FINDINGS: Left chest port remains in place. Bilateral COVID pneumonia with slight improvement in the upper lung zones from prior imaging. Mild unchanged elevation of right hemidiaphragm. No pneumomediastinum or pneumothorax. No significant pleural effusion. Surgical clips in the right axilla. IMPRESSION: Bilateral COVID pneumonia with slight improvement in the upper lung zones from exams 4 days ago. Electronically Signed   By: Keith Rake M.D.   On: 08/04/2020 01:10    EKG: Independently reviewed.  Sinus tachycardia at 105 bpm  Assessment/Plan Respiratory failure with hypoxia secondary to pneumonia due to COVID-19: Patient had just recently been hospitalized at [facility] 1/5-1/6 for symptoms related to pneumonia due to COVID-19.  It seems that she received at least 1 dose of remdesivir during her hospitalization  and 2 doses in the outpatient setting.  Chest x-ray showing slight improvement in upper lung zones.  However, O2 saturations dropped as low as 80% requiring now 10 L of oxygen to maintain O2  saturations.  D-dimer 2.38 which is just slightly lower than previous check.  CRP 6.7 trending up, but procalcitonin <0.1.  D-dimer which is slightly lower than previous. Patient has been given Decadron 6 mg IV. -Admit to a progressive bed -Continuous pulse oximetry with oxygen to maintain O2 saturation greater than 90% -Check ABG -Follow-up CT angiogram of the chest -Remdesivir per pharmacy day 4 of 5 -Prednisone 50 Daily -Start baricitinib -Antitussives as needed -Continue to check and monitor inflammatory markers daily  Hypokalemia: Acute.  Potassium noted to be as low as 2.7 on admission.  Patient was given potassium chloride 40 mEq p.o. and 30 mEq IV. -Check magnesium level -Continue to monitor and replace as needed  Cellulitis of the left arm: Patient had previously been treated for a cellulitis of the left forearm.  Does not appear to be infected at this time. -Continue to monitor   Essential hypertension: Blood pressures 91/76 ~148/77.  Home blood pressure medications include amlodipine-benazepril 10-20 mg daily -Did not restart home blood pressure medications due to some initial soft pressures -Labetalol IV as needed for systolic blood pressure greater than 180  Stage IV breast cancer: Patient examined treated for breast cancer 18 years ago.  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  She has a Port-A-Cath in the left upper chest wall -Continue anastrozole -Continue outpatient follow-up care at [facility]  Hypoalbuminemia: Acute.  Albumin on admission 3. -Check prealbumin -Ensure shakes in between meals  Anxiety/depression: Home regimen include Zoloft 250 mg daily and Xanax 2 mg 4 times daily. -Continue home regimen except for meds Xanax as needed and not  scheduled  Morbid obesity: BMI 42.91 kg/m  DVT prophylaxis: Lovenox  Code Status: Full Family Communication: Sister updated over the phone Disposition Plan: To be determined Consults called: none Admission status: Inpatient, require more than 2 midnight stay for acute respiratory failure with hypoxia with oxygen requirement  Norval Morton MD [facility]   If 7PM-7AM, please contact night-coverage   08/04/2020, 7:11 AM

## 2020-08-04 NOTE — Progress Notes (Signed)
65 yo F with increased O2 requirement and worsening respiratory status since being discharged after remdesivir and steroid treatment for COVID on 1/6.  Discharged on 3L, now requiring 10L in ED.  Pt also has metastatic breast cancer.  DDx includes: 1) worsening COVID-19 PNA 2) superimposed bacterial PNA 3) pulmonary embolism  CTA chest ordered at this time to try and help differentiate.

## 2020-08-04 NOTE — ED Notes (Addendum)
I updated pt's sister Claudia [name] and told pt is stable and getting admitted, but couldn't give further information due to HIPPA

## 2020-08-04 NOTE — ED Triage Notes (Signed)
The pt has had a positive covid test approx one week ago  She was just released  From [place] on nasal 02  The pt had a panic attack earlier tonight and her sat meter  Was showing in the 39s and she became nervous and upset  She has been treated for rt breast cancer  18 years ago  Now she has mets and has been going to [facility] for autoimmune drugs  She has a porta cath also

## 2020-08-04 NOTE — ED Notes (Signed)
Pt received breakfast tray. No needs from pt at this time. ?

## 2020-08-04 NOTE — ED Notes (Signed)
Pt states she had incontinent episode of urine and stool over night. I changed pt's bedding, pt washed herself up and I provided pt with clean gown.

## 2020-08-04 NOTE — ED Notes (Signed)
Patient transported to CT 

## 2020-08-04 NOTE — ED Provider Notes (Signed)
[facility] EMERGENCY DEPARTMENT Provider Note   CSN: [id number] Arrival date & time: 08/04/20  0028     History Chief Complaint - shortness of breath  Claudia [name] is a 65 y.o. female.  The history is provided by the patient and the EMS personnel.  Shortness of Breath Severity:  Severe Onset quality:  Sudden Timing:  Constant Progression:  Improving Chronicity:  New Relieved by:  Oxygen Worsened by:  Nothing Associated symptoms: abdominal pain and cough   Associated symptoms: no chest pain and no hemoptysis   Patient with history of breast cancer, depression, recent diagnosis of COVID-19 She presents with increasing shortness of breath.  Patient was just discharged from the hospital on January 6.  She was discharged on 3 L nasal cannula and received outpatient remdesivir treatment She had a treatment yesterday without any difficulty.  After going home and going to sleep she had increasing shortness of breath.  Her pulse ox at home with 3 L nasal cannula was around 77%.  She then began having a panic attack and 911 was called. She is now improving.  Denies chest pain.  She had recent abdominal discomfort that has resolved.  She also reports recent diarrhea     Past Medical History:  Diagnosis Date  . Anxiety   . Breast cancer ([place])   . Cancer of right breast ([place])    mastectomy  . Depression   . GERD (gastroesophageal reflux disease)   . Hiatal hernia   . Hypercholesterolemia   . Hypertension   . Irritable bowel   . PONV (postoperative nausea and vomiting)     Patient Active Problem List   Diagnosis Date Noted  . Acute respiratory failure with hypoxia ([place]) 07/31/2020  . Pneumonia due to COVID-19 virus 07/31/2020  . AKI (acute kidney injury) ([place]) 07/31/2020  . Obesity, Class III, BMI 40-49.9 (morbid obesity) ([place]) 07/31/2020  . Presence of left artificial knee joint 09/30/2017  . Arthritis of left knee 09/20/2017  . Closed fracture of medial  malleolus of left ankle with delayed healing 05/29/2014  . Closed trimalleolar fracture of left ankle 10/10/2013  . Thyroid nodule 10/23/2012  . Ataxia 10/22/2012  . Vertigo 10/22/2012  . History of breast cancer 10/22/2012  . Panic disorder 10/22/2012  . Depression 08/31/2012  . Anxiety 08/31/2012    Past Surgical History:  Procedure Laterality Date  . ABDOMINAL HYSTERECTOMY    . BREAST SURGERY     lumpectomy  . CHOLECYSTECTOMY    . FOOT SURGERY     bilateral spur removal  . HARDWARE REMOVAL Left 05/29/2014   Procedure: REMOVAL OF SCREW AND K-WIRE;  Surgeon: Sanjuana Kava, MD;  Location: [facility];  Service: Orthopedics;  Laterality: Left;  . JOINT REPLACEMENT Left 2017   Ankle  . MASTECTOMY    . ORIF ANKLE FRACTURE Left 10/10/2013   Procedure: OPEN REDUCTION INTERNAL FIXATION (ORIF) ANKLE FRACTURE;  Surgeon: Sanjuana Kava, MD;  Location: [facility];  Service: Orthopedics;  Laterality: Left;  . ORIF ANKLE FRACTURE Left 05/29/2014   Procedure: OPEN REDUCTION INTERNAL FIXATION (ORIF) ANKLE FRACTURE AND PLACEMENT OF BONE GRAFT MATERIAL;  Surgeon: Sanjuana Kava, MD;  Location: [facility];  Service: Orthopedics;  Laterality: Left;  . TONSILLECTOMY    . TOTAL ABDOMINAL HYSTERECTOMY W/ BILATERAL SALPINGOOPHORECTOMY    . TOTAL KNEE ARTHROPLASTY Left 09/20/2017   Procedure: LEFT TOTAL KNEE ARTHROPLASTY;  Surgeon: Marybelle Killings, MD;  Location: [place];  Service: Orthopedics;  Laterality: Left;  .  TUBAL LIGATION       OB History   No obstetric history on file.     Family History  Problem Relation Age of Onset  . Liver disease Mother   . Hypertension Father   . Heart attack Sister     Social History   Tobacco Use  . Smoking status: Former Smoker    Packs/day: 0.25    Years: 40.00    Pack years: 10.00    Types: Cigarettes    Quit date: 07/23/2018    Years since quitting: 2.0  . Smokeless tobacco: Never Used  Vaping Use  . Vaping Use: Former  Substance Use Topics  . Alcohol use: No  .  Drug use: No    Home Medications Prior to Admission medications   Medication Sig Start Date End Date Taking? Authorizing Provider  albuterol (VENTOLIN HFA) 108 (90 Base) MCG/ACT inhaler Inhale 2 puffs into the lungs 3 (three) times daily. 08/01/20   Kathie Dike, MD  alprazolam Duanne Moron) 2 MG tablet Take 2 mg by mouth 4 (four) times daily.    [provider]  amLODipine-benazepril (LOTREL) 10-20 MG capsule Take 1 capsule by mouth daily.    [provider]  anastrozole (ARIMIDEX) 1 MG tablet Take 1 tablet by mouth daily. 07/17/20   [provider]  cetirizine (ZYRTEC) 10 MG tablet Take 10 mg by mouth daily as needed for allergies.    [provider]  ezetimibe (ZETIA) 10 MG tablet Take 1 tablet (10 mg total) by mouth daily. 09/02/12   Patrecia Pour, NP  gabapentin (NEURONTIN) 100 MG capsule Take 200 mg by mouth 3 (three) times daily. 05/07/20   [provider]  guaiFENesin-dextromethorphan (ROBITUSSIN DM) 100-10 MG/5ML syrup Take 10 mLs by mouth every 4 (four) hours as needed for cough. 08/01/20   Kathie Dike, MD  HYDROcodone-acetaminophen (NORCO) 5-325 MG tablet Take 1 tablet by mouth at bedtime as needed for moderate pain. 10/14/17   Marybelle Killings, MD  predniSONE (DELTASONE) 50 MG tablet Take 1 tablet (50 mg total) by mouth daily. 08/04/20   Kathie Dike, MD  sertraline (ZOLOFT) 100 MG tablet Take 250 mg by mouth daily.  09/02/12   Patrecia Pour, NP    Allergies    Statins  Review of Systems   Review of Systems  Constitutional: Positive for fatigue.  Respiratory: Positive for cough and shortness of breath. Negative for hemoptysis.   Cardiovascular: Negative for chest pain.  Gastrointestinal: Positive for abdominal pain.  Psychiatric/Behavioral: The patient is nervous/anxious.   All other systems reviewed and are negative.   Physical Exam Updated Vital Signs BP 131/86   Pulse 92   Resp (!) 43   Ht 1.626 m (5\' 4" )   Wt 113.4 kg    SpO2 90%   BMI 42.91 kg/m   Physical Exam  CONSTITUTIONAL: Ill-appearing HEAD: Normocephalic/atraumatic EYES: EOMI ENMT: Mucous membranes moist NECK: supple no meningeal signs SPINE/BACK:entire spine nontender CV: S1/S2 noted, no murmurs/rubs/gallops noted LUNGS: Crackles bilaterally, on 4LNC - pulse ox 85% ABDOMEN: soft, nontender NEURO: Pt is awake/alert/appropriate, moves all extremitiesx4.  No facial droop.   EXTREMITIES: pulses normal/equal, full ROM, no calf tenderness or edema SKIN: warm, color normal PSYCH: no abnormalities of mood noted, alert and oriented to situation  ED Results / Procedures / Treatments   Labs (all labs ordered are listed, but only abnormal results are displayed) Labs Reviewed  CBC WITH DIFFERENTIAL/PLATELET - Abnormal; Notable for the following components:  Result Value   Hemoglobin 11.9 (*)    Neutro Abs 8.4 (*)    All other components within normal limits  COMPREHENSIVE METABOLIC PANEL - Abnormal; Notable for the following components:   Potassium 2.7 (*)    Glucose, Bld 102 (*)    Calcium 8.7 (*)    Albumin 3.0 (*)    All other components within normal limits  PROCALCITONIN  C-REACTIVE PROTEIN  D-DIMER, QUANTITATIVE (NOT AT [facility])    EKG EKG Interpretation  Date/Time:  Sunday August 04 2020 01:27:46 EST Ventricular Rate:  105 PR Interval:    QRS Duration: 85 QT Interval:  364 QTC Calculation: 482 R Axis:   90 Text Interpretation: Sinus tachycardia Borderline right axis deviation Borderline repolarization abnormality No significant change since last tracing Confirmed by Ripley Fraise [id number]) on 08/04/2020 1:29:40 AM   Radiology DG Chest Port 1 View  Result Date: 08/04/2020 CLINICAL DATA:  Shortness of breath.  COVID positive. EXAM: PORTABLE CHEST 1 VIEW COMPARISON:  Radiographs and chest CTA 07/31/2020 FINDINGS: Left chest port remains in place. Bilateral COVID pneumonia with slight improvement in the upper lung zones from  prior imaging. Mild unchanged elevation of right hemidiaphragm. No pneumomediastinum or pneumothorax. No significant pleural effusion. Surgical clips in the right axilla. IMPRESSION: Bilateral COVID pneumonia with slight improvement in the upper lung zones from exams 4 days ago. Electronically Signed   By: Keith Rake M.D.   On: 08/04/2020 01:10    Procedures .Critical Care Performed by: Ripley Fraise, MD Authorized by: Ripley Fraise, MD   Critical care provider statement:    Critical care time (minutes):  80   Critical care start time:  08/04/2020 2:30 AM   Critical care end time:  08/04/2020 3:50 AM   Critical care time was exclusive of:  Separately billable procedures and treating other patients   Critical care was necessary to treat or prevent imminent or life-threatening deterioration of the following conditions:  Respiratory failure   Critical care was time spent personally by me on the following activities:  Discussions with consultants, development of treatment plan with patient or surrogate, evaluation of patient's response to treatment, examination of patient, re-evaluation of patient's condition, pulse oximetry, ordering and review of radiographic studies, ordering and review of laboratory studies, ordering and performing treatments and interventions and review of old charts   I assumed direction of critical care for this patient from another provider in my specialty: no       Medications Ordered in ED Medications  dexamethasone (DECADRON) injection 6 mg (has no administration in time range)  dexamethasone (DECADRON) injection 6 mg (has no administration in time range)  potassium chloride SA (KLOR-CON) CR tablet 40 mEq (has no administration in time range)  potassium chloride 10 mEq in 100 mL IVPB (has no administration in time range)    ED Course  I have reviewed the triage vital signs and the nursing notes.  Pertinent labs & imaging results that were available during  my care of the patient were reviewed by me and considered in my medical decision making (see chart for details).    MDM Rules/Calculators/A&P                          3:58 AM Patient recently diagnosed with COVID-19 on January 5.  She was admitted, received remdesivir and was sent home with oxygen. She was doing well as an outpatient receiving her remdesivir infusions, when tonight she became abruptly  short of breath and increased oxygen requirement.  She had been on 3 L at home, but she is now requiring escalating levels of oxygen.  I checked on patient multiple times and had to keep increasing her O2 requirement.  She is currently on 10 L nasal cannula, and appears tachypneic. Due to increasing demand for oxygen, patient will be admitted to the hospital. 4:13 AM Patient found to have hypokalemia, supplementation has been ordered. Discussed the case with Dr. Alcario Drought with [facility] hospitalist. Plan will be for admission and she will get another dose of Decadron. She has already received remdesivir. Patient is high risk for PE due to history of cancer as well as COVID-19.  She had a negative CT chest earlier this month.  After discussion with hospitalist, plan will be to recheck D-dimer and other inflammatory markers and if those are worsening she will likely need repeat CT chest. Patient denied any chest pain or hemoptysis.  Claudia [name] was evaluated in Emergency Department on 08/04/2020 for the symptoms described in the history of present illness. She was evaluated in the context of the global COVID-19 pandemic, which necessitated consideration that the patient might be at risk for infection with the SARS-CoV-2 virus that causes COVID-19. Institutional protocols and algorithms that pertain to the evaluation of patients at risk for COVID-19 are in a state of rapid change based on information released by regulatory bodies including the CDC and federal and state organizations. These policies and  algorithms were followed during the patient's care in the ED.  Final Clinical Impression(s) / ED Diagnoses Final diagnoses:  Acute respiratory failure due to severe acute respiratory syndrome coronavirus 2 (SARS-CoV-2) infection ([facility])  Hypokalemia    Rx / DC Orders ED Discharge Orders    None       Ripley Fraise, MD 08/04/20 [phone]

## 2020-08-04 NOTE — ED Notes (Signed)
Levada Dy, sister, [phone] would like an update when available

## 2020-08-05 ENCOUNTER — Ambulatory Visit (HOSPITAL_COMMUNITY): Payer: Medicaid Other

## 2020-08-05 ENCOUNTER — Inpatient Hospital Stay (HOSPITAL_COMMUNITY): Payer: Medicaid Other

## 2020-08-05 ENCOUNTER — Encounter (HOSPITAL_COMMUNITY): Payer: Medicaid Other

## 2020-08-05 DIAGNOSIS — U071 COVID-19: Secondary | ICD-10-CM | POA: Diagnosis not present

## 2020-08-05 DIAGNOSIS — J9601 Acute respiratory failure with hypoxia: Secondary | ICD-10-CM | POA: Diagnosis not present

## 2020-08-05 LAB — CBC WITH DIFFERENTIAL/PLATELET
Abs Immature Granulocytes: 0.03 10*3/uL (ref 0.00–0.07)
Basophils Absolute: 0 10*3/uL (ref 0.0–0.1)
Basophils Relative: 0 %
Eosinophils Absolute: 0 10*3/uL (ref 0.0–0.5)
Eosinophils Relative: 0 %
HCT: 37.3 % (ref 36.0–46.0)
Hemoglobin: 11.4 g/dL — ABNORMAL LOW (ref 12.0–15.0)
Immature Granulocytes: 1 %
Lymphocytes Relative: 25 %
Lymphs Abs: 1.5 10*3/uL (ref 0.7–4.0)
MCH: 27.7 pg (ref 26.0–34.0)
MCHC: 30.6 g/dL (ref 30.0–36.0)
MCV: 90.8 fL (ref 80.0–100.0)
Monocytes Absolute: 0.3 10*3/uL (ref 0.1–1.0)
Monocytes Relative: 6 %
Neutro Abs: 4.1 10*3/uL (ref 1.7–7.7)
Neutrophils Relative %: 68 %
Platelets: 185 10*3/uL (ref 150–400)
RBC: 4.11 MIL/uL (ref 3.87–5.11)
RDW: 14.9 % (ref 11.5–15.5)
WBC: 6 10*3/uL (ref 4.0–10.5)
nRBC: 0 % (ref 0.0–0.2)

## 2020-08-05 LAB — COMPREHENSIVE METABOLIC PANEL
ALT: 24 U/L (ref 0–44)
AST: 16 U/L (ref 15–41)
Albumin: 2.7 g/dL — ABNORMAL LOW (ref 3.5–5.0)
Alkaline Phosphatase: 57 U/L (ref 38–126)
Anion gap: 11 (ref 5–15)
BUN: 12 mg/dL (ref 8–23)
CO2: 25 mmol/L (ref 22–32)
Calcium: 8.4 mg/dL — ABNORMAL LOW (ref 8.9–10.3)
Chloride: 105 mmol/L (ref 98–111)
Creatinine, Ser: 0.6 mg/dL (ref 0.44–1.00)
GFR, Estimated: >60 mL/min (ref >60)
Glucose, Bld: 92 mg/dL (ref 70–99)
Potassium: 2.8 mmol/L — ABNORMAL LOW (ref 3.5–5.1)
Sodium: 141 mmol/L (ref 135–145)
Total Bilirubin: 0.8 mg/dL (ref 0.3–1.2)
Total Protein: 6.2 g/dL — ABNORMAL LOW (ref 6.5–8.1)

## 2020-08-05 LAB — PHOSPHORUS: Phosphorus: 3.1 mg/dL (ref 2.5–4.6)

## 2020-08-05 LAB — MAGNESIUM: Magnesium: 2.1 mg/dL (ref 1.7–2.4)

## 2020-08-05 LAB — D-DIMER, QUANTITATIVE: D-Dimer, Quant: 2.2 ug/mL-FEU — ABNORMAL HIGH (ref 0.00–0.50)

## 2020-08-05 LAB — CULTURE, BLOOD (ROUTINE X 2)
Culture: NO GROWTH
Culture: NO GROWTH
Special Requests: ADEQUATE
Special Requests: ADEQUATE

## 2020-08-05 LAB — BRAIN NATRIURETIC PEPTIDE: B Natriuretic Peptide: 36.4 pg/mL (ref 0.0–100.0)

## 2020-08-05 LAB — FERRITIN: Ferritin: 289 ng/mL (ref 11–307)

## 2020-08-05 LAB — POTASSIUM: Potassium: 3.9 mmol/L (ref 3.5–5.1)

## 2020-08-05 LAB — C-REACTIVE PROTEIN: CRP: 10 mg/dL — ABNORMAL HIGH (ref <1.0)

## 2020-08-05 MED ORDER — METHYLPREDNISOLONE SODIUM SUCC 125 MG IJ SOLR
60.0000 mg | Freq: Two times a day (BID) | INTRAMUSCULAR | Status: DC
  Administered 2020-08-05 – 2020-08-11 (×14): 60 mg via INTRAVENOUS
  Filled 2020-08-05 (×14): qty 2

## 2020-08-05 MED ORDER — ALPRAZOLAM 0.5 MG PO TABS
2.0000 mg | ORAL_TABLET | Freq: Four times a day (QID) | ORAL | Status: DC | PRN
  Administered 2020-08-05 – 2020-08-12 (×22): 2 mg via ORAL
  Filled 2020-08-05 (×6): qty 4
  Filled 2020-08-05: qty 8
  Filled 2020-08-05 (×2): qty 4
  Filled 2020-08-05: qty 8
  Filled 2020-08-05 (×9): qty 4
  Filled 2020-08-05: qty 8
  Filled 2020-08-05: qty 4
  Filled 2020-08-05: qty 8

## 2020-08-05 MED ORDER — POTASSIUM CHLORIDE CRYS ER 20 MEQ PO TBCR
40.0000 meq | EXTENDED_RELEASE_TABLET | Freq: Four times a day (QID) | ORAL | Status: AC
  Administered 2020-08-05 (×2): 40 meq via ORAL
  Filled 2020-08-05 (×2): qty 2

## 2020-08-05 MED ORDER — LACTATED RINGERS IV SOLN: INTRAVENOUS | Status: DC

## 2020-08-05 MED ORDER — ALPRAZOLAM 0.25 MG PO TABS
1.0000 mg | ORAL_TABLET | Freq: Three times a day (TID) | ORAL | Status: DC | PRN
  Administered 2020-08-05: 1 mg via ORAL
  Filled 2020-08-05: qty 4

## 2020-08-05 MED ORDER — ENOXAPARIN SODIUM 60 MG/0.6ML ~~LOC~~ SOLN
60.0000 mg | Freq: Two times a day (BID) | SUBCUTANEOUS | Status: DC
  Administered 2020-08-05 – 2020-08-12 (×14): 60 mg via SUBCUTANEOUS
  Filled 2020-08-05 (×16): qty 0.6

## 2020-08-05 NOTE — ED Notes (Signed)
Lunch Tray Ordered [id number]. 

## 2020-08-05 NOTE — ED Notes (Signed)
SDU Breakfast Ordered 

## 2020-08-05 NOTE — ED Notes (Signed)
Updated sister Levada Dy, with pt okay, on pt status.

## 2020-08-05 NOTE — ED Notes (Signed)
Updated sister Levada Dy, with pt approval, on pt status. No other questions at this time.

## 2020-08-05 NOTE — ED Notes (Signed)
Dinner Trays Ordered @ [phone].

## 2020-08-05 NOTE — Progress Notes (Signed)
PROGRESS NOTE                                                                                                                                                                                                             Patient Demographics:    Claudia [name], is a 65 y.o. female, DOB - 07/31/1955, [id number]  Outpatient Primary MD for the patient is Pcp, No    LOS - 1  Admit date - 08/04/2020    Chief Complaint  Patient presents with  . Covid Positive       Brief Narrative (HPI from H&P)  Claudia [name] is a 65 y.o. female with medical history significant of hypertension, hyperlipidemia, stage IV breast cancer, anxiety, depression, GERD, and morbid obesity presents back to the hospital with worsening shortness of breath, he was recently admitted at [facility] for COVID-19 pneumonia and discharged home on a steroid taper.  She also had recent diagnosis of left arm cellulitis which has improved considerably.  In the ER she was diagnosed with acute hypoxic respiratory failure, CTA was negative and she was admitted for further treatment.   Subjective:    Claudia [name] today has, No headache, No chest pain, No abdominal pain - No Nausea, No new weakness tingling or numbness, +ve cough & SOB.   Assessment  & Plan :     1. Acute Hypoxic Resp. Failure due to Acute Covid 19 Viral Pneumonitis - she fortunately not vaccinated, initially diagnosed with COVID-19 pneumonia on 07/31/2020, finished remdesivir outpatient, was on oral steroid taper but looks like she has failed.  Continue IV steroids, CTA rules out PE.  Monitor closely with steroids on board and pulmonary toiletry.  Encouraged the patient to sit up in chair in the daytime use I-S and flutter valve for pulmonary toiletry and then prone in bed when at night.  Will advance activity and titrate down oxygen as possible.   SpO2: 90 % O2 Flow Rate (L/min): 10  L/min FiO2 (%): 100 %  Recent Labs  Lab 07/31/20 0520 07/31/20 0634 08/01/20 0727 08/04/20 0230 08/04/20 0405 08/04/20 0413 08/04/20 0829 08/05/20 0517  WBC 5.4  --  3.4* 10.3  --   --   --  6.0  HGB 11.6*  --  11.1* 11.9*  --   --  10.9* 11.4*  HCT  37.0  --  36.7 36.0  --   --  32.0* 37.3  PLT 151  --  155 202  --   --   --  185  CRP 6.3*  --  3.9*  --   --  6.7*  --  10.0*  DDIMER 2.52*  --  2.45*  --   --  2.38*  --  2.20*  PROCALCITON 0.10  --   --   --  <0.10  --   --   --   AST 91*  --  40 24  --   --   --  16  ALT 38  --  36 35  --   --   --  24  ALKPHOS 88  --  76 74  --   --   --  57  BILITOT 0.4  --  0.4 0.9  --   --   --  0.8  ALBUMIN 3.3*  --  3.2* 3.0*  --   --   --  2.7*  LATICACIDVEN 0.8  --   --   --   --   --   --   --   SARSCOV2NAA  --  POSITIVE*  --   --   --   --   --   --     2.  Recent left arm cellulitis.  Resolved.  3.  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  Supportive care outpatient follow-up with PCP and oncology.  4.  Hypokalemia.  Replace and monitor.   5.  Anxiety and depression.  On Zoloft and as needed benzodiazepine.  6.  Morbid obesity.  BMI of 42.  Outpatient follow-up with PCP.  7.  Hypertension.  Labetalol as needed for now.  8.  Elevated D-dimer.  Likely due to inflammation, CTA negative, check leg ultrasound.  Now on moderate dose Lovenox.       Condition - Extremely Guarded  Family Communication  :   Horris Latino [phone] on 08/05/2020 at 8:39 AM - voicemail not set up.  Code Status :  Full  Consults  :    Procedures  :    CTA - 1. No pulmonary emboli. 2. Worsened, more extensive distribution of bilateral patchy pulmonary infiltrates consistent with worsening viral pneumonia. No dense consolidation, lobar collapse or pleural effusion. 3. Aortic atherosclerosis. Aortic Atherosclerosis  PUD Prophylaxis :  Pepcid  Disposition Plan  :    Status is: Inpatient  Remains inpatient  appropriate because:IV treatments appropriate due to intensity of illness or inability to take PO   Dispo: The patient is from: Home              Anticipated d/c is to: Home              Anticipated d/c date is: > 3 days              Patient currently is not medically stable to d/c.   DVT Prophylaxis  :  Lovenox    Lab Results  Component Value Date   PLT 185 08/05/2020    Diet :  Diet Order            Diet regular Room service appropriate? Yes; Fluid consistency: Thin  Diet effective now                  Inpatient Medications  Scheduled Meds: . albuterol  2 puff Inhalation Q6H  .  anastrozole  1 mg Oral Daily  . vitamin C  500 mg Oral Daily  . baricitinib  4 mg Oral Daily  . Chlorhexidine Gluconate Cloth  6 each Topical Daily  . enoxaparin (LOVENOX) injection  60 mg Subcutaneous Q12H  . ezetimibe  10 mg Oral Daily  . famotidine  20 mg Oral BID  . feeding supplement  237 mL Oral BID BM  . loratadine  10 mg Oral Daily  . methylPREDNISolone (SOLU-MEDROL) injection  60 mg Intravenous Q12H  . potassium chloride  40 mEq Oral Q6H  . sertraline  250 mg Oral Daily  . sodium chloride flush  3 mL Intravenous Q12H  . zinc sulfate  220 mg Oral Daily   Continuous Infusions: . remdesivir 100 mg in NS 100 mL Stopped (08/04/20 1036)   PRN Meds:.acetaminophen, alprazolam, chlorpheniramine-HYDROcodone, gabapentin, guaiFENesin-dextromethorphan, HYDROcodone-acetaminophen, labetalol, loperamide, [DISCONTINUED] ondansetron **OR** ondansetron (ZOFRAN) IV  Antibiotics  :    Anti-infectives (From admission, onward)   Start     Dose/Rate Route Frequency Ordered Stop   08/05/20 1000  remdesivir 100 mg in sodium chloride 0.9 % 100 mL IVPB  Status:  Discontinued       "Followed by" Linked Group Details   100 mg 200 mL/hr over 30 Minutes Intravenous Daily 08/04/20 0740 08/04/20 0745   08/04/20 1000  remdesivir 100 mg in sodium chloride 0.9 % 100 mL IVPB        100 mg 200 mL/hr over 30  Minutes Intravenous Daily 08/04/20 0745 08/06/20 0959   08/04/20 0745  remdesivir 200 mg in sodium chloride 0.9% 250 mL IVPB  Status:  Discontinued       "Followed by" Linked Group Details   200 mg 580 mL/hr over 30 Minutes Intravenous Once 08/04/20 0740 08/04/20 0745       Time Spent in minutes  30   Lala Lund M.D on 08/05/2020 at 8:37 AM  To page go to [email or web address]   [facility] -  Office  [phone]     See all Orders from today for further details    Objective:   Vitals:   08/05/20 0200 08/05/20 0300 08/05/20 0400 08/05/20 0814  BP: 131/81 133/82 128/89 116/62  Pulse: 70 71 67 (!) 112  Resp: (!) 30 (!) 27 (!) 31 (!) 35  Temp:    98.1 F (36.7 C)  TempSrc:    Oral  SpO2: 95% 91% 92% 90%  Weight:      Height:        Wt Readings from Last 3 Encounters:  08/04/20 113.4 kg  07/31/20 113.4 kg  07/23/20 113.4 kg     Intake/Output Summary (Last 24 hours) at 08/05/2020 0837 Last data filed at 08/04/2020 1152 Gross per 24 hour  Intake 100 ml  Output 250 ml  Net -150 ml     Physical Exam  Awake Alert, No new F.N deficits, Normal affect [place].AT,PERRAL Supple Neck,No JVD, No cervical lymphadenopathy appriciated.  Symmetrical Chest wall movement, Good air movement bilaterally, CTAB RRR,No Gallops,Rubs or new Murmurs, No Parasternal Heave +ve B.Sounds, Abd Soft, No tenderness, No organomegaly appriciated, No rebound - guarding or rigidity. No Cyanosis, L arm mild redness    Data Review:    CBC Recent Labs  Lab 07/31/20 0520 08/01/20 0727 08/04/20 0230 08/04/20 0829 08/05/20 0517  WBC 5.4 3.4* 10.3  --  6.0  HGB 11.6* 11.1* 11.9* 10.9* 11.4*  HCT 37.0 36.7 36.0 32.0* 37.3  PLT 151 155 202  --  185  MCV 91.4 92.4 87.0  --  90.8  MCH 28.6 28.0 28.7  --  27.7  MCHC 31.4 30.2 33.1  --  30.6  RDW 15.3 15.1 14.7  --  14.9  LYMPHSABS 0.9 0.7 1.4  --  1.5  MONOABS 0.1 0.2 0.5  --  0.3  EOSABS 0.0 0.0 0.0  --  0.0  BASOSABS 0.0 0.0 0.0  --   0.0    Recent Labs  Lab 07/31/20 0520 08/01/20 0727 08/04/20 0230 08/04/20 0405 08/04/20 0413 08/04/20 0829 08/04/20 0830 08/05/20 0517  NA 135 139 140  --   --  138  --  141  K 3.6 4.8 2.7*  --   --  3.3*  --  2.8*  CL 106 109 104  --   --   --   --  105  CO2 22 22 25   --   --   --   --  25  GLUCOSE 104* 161* 102*  --   --   --   --  92  BUN 13 11 11   --   --   --   --  12  CREATININE 1.32* 0.62 0.67  --   --   --   --  0.60  CALCIUM 8.4* 8.7* 8.7*  --   --   --   --  8.4*  AST 91* 40 24  --   --   --   --  16  ALT 38 36 35  --   --   --   --  24  ALKPHOS 88 76 74  --   --   --   --  57  BILITOT 0.4 0.4 0.9  --   --   --   --  0.8  ALBUMIN 3.3* 3.2* 3.0*  --   --   --   --  2.7*  MG 2.2  --   --   --   --   --  1.9 2.1  CRP 6.3* 3.9*  --   --  6.7*  --   --  10.0*  DDIMER 2.52* 2.45*  --   --  2.38*  --   --  2.20*  PROCALCITON 0.10  --   --  <0.10  --   --   --   --   LATICACIDVEN 0.8  --   --   --   --   --   --   --     ------------------------------------------------------------------------------------------------------------------ No results for input(s): CHOL, HDL, LDLCALC, TRIG, CHOLHDL, LDLDIRECT in the last 72 hours.  Lab Results  Component Value Date   HGBA1C 5.4 10/22/2012   ------------------------------------------------------------------------------------------------------------------ No results for input(s): TSH, T4TOTAL, T3FREE, THYROIDAB in the last 72 hours.  Invalid input(s): FREET3  Cardiac Enzymes No results for input(s): CKMB, TROPONINI, MYOGLOBIN in the last 168 hours.  Invalid input(s): CK ------------------------------------------------------------------------------------------------------------------ No results found for: BNP  Micro Results Recent Results (from the past 240 hour(s))  Blood Culture (routine x 2)     Status: None   Collection Time: 07/31/20  5:20 AM   Specimen: BLOOD  Result Value Ref Range Status   Specimen  Description BLOOD RIGHT ANTECUBITAL  Final   Special Requests   Final    BOTTLES DRAWN AEROBIC AND ANAEROBIC Blood Culture adequate volume   Culture   Final    NO GROWTH 5 DAYS Performed at [facility], [address] [place], [place] [address]    Report  Status 08/05/2020 FINAL  Final  Blood Culture (routine x 2)     Status: None   Collection Time: 07/31/20  5:51 AM   Specimen: BLOOD  Result Value Ref Range Status   Specimen Description BLOOD LEFT HAND  Final   Special Requests   Final    BOTTLES DRAWN AEROBIC AND ANAEROBIC Blood Culture adequate volume   Culture   Final    NO GROWTH 5 DAYS Performed at [facility], [address] [place], [place] [address]    Report Status 08/05/2020 FINAL  Final  Resp Panel by RT-PCR (Flu A&B, Covid) Nasopharyngeal Swab     Status: Abnormal   Collection Time: 07/31/20  6:34 AM   Specimen: Nasopharyngeal Swab; Nasopharyngeal(NP) swabs in vial transport medium  Result Value Ref Range Status   SARS Coronavirus 2 by RT PCR POSITIVE (A) NEGATIVE Final    Comment: RESULT CALLED TO, READ BACK BY AND VERIFIED WITH: DOSS,M@0938  by MATTHEWS,B 1.5.22 (NOTE) SARS-CoV-2 target nucleic acids are DETECTED.  The SARS-CoV-2 RNA is generally detectable in upper respiratory specimens during the acute phase of infection. Positive results are indicative of the presence of the identified virus, but do not rule out bacterial infection or co-infection with other pathogens not detected by the test. Clinical correlation with patient history and other diagnostic information is necessary to determine patient infection status. The expected result is Negative.  Fact Sheet for Patients: BloggerCourse.comhttps://www.fda.gov/media/152166/download  Fact Sheet for Healthcare Providers: SeriousBroker.ithttps://www.fda.gov/media/152162/download  This test is not yet approved or cleared by the [place] FDA and  has been authorized for detection and/or diagnosis of SARS-CoV-2 by FDA under an  Emergency Use Authorization (EUA).  This EUA will remain in effect (meaning this test can be  used) for the duration of  the COVID-19 declaration under Section 564(b)(1) of the Act, 21 U.S.C. section 360bbb-3(b)(1), unless the authorization is terminated or revoked sooner.     Influenza A by PCR NEGATIVE NEGATIVE Final   Influenza B by PCR NEGATIVE NEGATIVE Final    Comment: (NOTE) The Xpert Xpress SARS-CoV-2/FLU/RSV plus assay is intended as an aid in the diagnosis of influenza from Nasopharyngeal swab specimens and should not be used as a sole basis for treatment. Nasal washings and aspirates are unacceptable for Xpert Xpress SARS-CoV-2/FLU/RSV testing.  Fact Sheet for Patients: BloggerCourse.comhttps://www.fda.gov/media/152166/download  Fact Sheet for Healthcare Providers: SeriousBroker.ithttps://www.fda.gov/media/152162/download  This test is not yet approved or cleared by the [place] FDA and has been authorized for detection and/or diagnosis of SARS-CoV-2 by FDA under an Emergency Use Authorization (EUA). This EUA will remain in effect (meaning this test can be used) for the duration of the COVID-19 declaration under Section 564(b)(1) of the Act, 21 U.S.C. section 360bbb-3(b)(1), unless the authorization is terminated or revoked.  Performed at [facility], [address] [place], [place] [address]   C Difficile Quick Screen w PCR reflex     Status: None   Collection Time: 08/04/20  5:49 PM   Specimen: STOOL  Result Value Ref Range Status   C Diff antigen NEGATIVE NEGATIVE Final   C Diff toxin NEGATIVE NEGATIVE Final   C Diff interpretation No C. difficile detected.  Final    Comment: Performed at [facility], [address] [place], [place] [address]    Radiology Reports CT ANGIO CHEST PE W OR WO CONTRAST  Result Date: 08/04/2020 CLINICAL DATA:  Coronavirus infection. Worsening respiratory status and oxygen requirement. Question emboli. EXAM: CT ANGIOGRAPHY CHEST WITH CONTRAST  TECHNIQUE: Multidetector CT imaging of the chest was performed using the standard protocol during bolus administration of intravenous contrast. Multiplanar CT image reconstructions and MIPs were obtained to evaluate the vascular anatomy. CONTRAST:  17mL OMNIPAQUE IOHEXOL 350 MG/ML SOLN COMPARISON:  07/31/2020 FINDINGS: Cardiovascular: Heart size is normal. There is aortic atherosclerosis without aneurysm or dissection. Pulmonary arterial opacification is good. There are no pulmonary emboli. Mediastinum/Nodes: No mediastinal or hilar mass or lymphadenopathy. Lungs/Pleura: Bilateral hazy patchy pulmonary infiltrates have worsened in extent since the study 4 days ago. No dense consolidation, lobar collapse or pleural effusion. Upper Abdomen: Negative Musculoskeletal: Mastectomy and reconstruction on the right. Review of the MIP images confirms the above findings. IMPRESSION: 1. No pulmonary emboli. 2. Worsened, more extensive distribution of bilateral patchy pulmonary infiltrates consistent with worsening viral pneumonia. No dense consolidation, lobar collapse or pleural effusion. 3. Aortic atherosclerosis. Aortic Atherosclerosis (ICD10-I70.0). Electronically Signed   By: Nelson Chimes M.D.   On: 08/04/2020 14:00   CT Angio Chest PE W/Cm &/Or Wo Cm  Result Date: 07/31/2020 CLINICAL DATA:  COVID positive. Breast cancer with prior plans to start chemotherapy today. EXAM: CT ANGIOGRAPHY CHEST WITH CONTRAST TECHNIQUE: Multidetector CT imaging of the chest was performed using the standard protocol during bolus administration of intravenous contrast. Multiplanar CT image reconstructions and MIPs were obtained to evaluate the vascular anatomy. CONTRAST:  176mL OMNIPAQUE IOHEXOL 350 MG/ML SOLN COMPARISON:  11/13/2015.  Outside head CT report 05/07/2020 FINDINGS: Cardiovascular: Cardiomegaly. No pericardial effusion. Aortic and coronary atherosclerotic calcifications. Satisfactory opacification of the pulmonary arteries to at  least the segmental level with no convincing pulmonary filling defect when allowing for intermittent streak and motion artifact, motion especially affecting the upper lobes. Mediastinum/Nodes: Negative for adenopathy or mass in the mediastinum. Negative esophagus. There has been right axillary dissection with no axillary adenopathy. 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 Lungs/Pleura: Patchy ground-glass opacity in the bilateral lungs consistent with COVID pneumonia. There is a degree of bronchomalacia with intermittent airway narrowing. Per outside head CT 05/07/2020 there is pulmonary nodularity which is not assessed in this setting. Upper Abdomen: Negative Musculoskeletal: Trabecular coarsening in the medial right clavicle which is stable from 2017. small sclerotic focus in the upper and posterior sternum, a sternal sclerotic foci was described on outside PET-CT. Review of the MIP images confirms the above findings. IMPRESSION: 1. COVID pattern pneumonia. 2. No evidence of pulmonary embolism. 3. Cardiomegaly. Electronically Signed   By: Monte Fantasia M.D.   On: 07/31/2020 07:22   DG Chest Port 1 View  Result Date: 08/04/2020 CLINICAL DATA:  Shortness of breath.  COVID positive. EXAM: PORTABLE CHEST 1 VIEW COMPARISON:  Radiographs and chest CTA 07/31/2020 FINDINGS: Left chest port remains in place. Bilateral COVID pneumonia with slight improvement in the upper lung zones from prior imaging. Mild unchanged elevation of right hemidiaphragm. No pneumomediastinum or pneumothorax. No significant pleural effusion. Surgical clips in the right axilla. IMPRESSION: Bilateral COVID pneumonia with slight improvement in the upper lung zones from exams 4 days ago. Electronically Signed   By: Keith Rake M.D.   On: 08/04/2020 01:10   DG Chest Port 1 View  Result Date: 07/31/2020 CLINICAL DATA:  COVID.  Hypoxia. EXAM: PORTABLE CHEST 1 VIEW COMPARISON:  09/13/2017. FINDINGS: Port-A-Cath noted with  tip over right atrium. Heart size normal. Lung volumes. Diffuse bilateral interstitial infiltrates are noted. Findings consistent pneumonitis. No pleural effusion or pneumothorax. Degenerative changes scoliosis thoracic spine. Degenerative changes both shoulders. No acute bony abnormality. Surgical  clips right chest. IMPRESSION: 1. Port-A-Cath noted with tip over right atrium. 2. Low lung volumes. Diffuse bilateral interstitial infiltrates. Findings consistent with pneumonitis. Electronically Signed   By: Marcello Moores  Register   On: 07/31/2020 05:19

## 2020-08-06 ENCOUNTER — Inpatient Hospital Stay (HOSPITAL_COMMUNITY): Payer: Medicaid Other

## 2020-08-06 DIAGNOSIS — R7989 Other specified abnormal findings of blood chemistry: Secondary | ICD-10-CM

## 2020-08-06 DIAGNOSIS — U071 COVID-19: Secondary | ICD-10-CM

## 2020-08-06 DIAGNOSIS — J9601 Acute respiratory failure with hypoxia: Secondary | ICD-10-CM | POA: Diagnosis not present

## 2020-08-06 LAB — CBC WITH DIFFERENTIAL/PLATELET
Abs Immature Granulocytes: 0.02 10*3/uL (ref 0.00–0.07)
Basophils Absolute: 0 10*3/uL (ref 0.0–0.1)
Basophils Relative: 0 %
Eosinophils Absolute: 0 10*3/uL (ref 0.0–0.5)
Eosinophils Relative: 0 %
HCT: 33.4 % — ABNORMAL LOW (ref 36.0–46.0)
Hemoglobin: 10.7 g/dL — ABNORMAL LOW (ref 12.0–15.0)
Immature Granulocytes: 0 %
Lymphocytes Relative: 9 %
Lymphs Abs: 0.5 10*3/uL — ABNORMAL LOW (ref 0.7–4.0)
MCH: 28.5 pg (ref 26.0–34.0)
MCHC: 32 g/dL (ref 30.0–36.0)
MCV: 89.1 fL (ref 80.0–100.0)
Monocytes Absolute: 0.2 10*3/uL (ref 0.1–1.0)
Monocytes Relative: 4 %
Neutro Abs: 5.2 10*3/uL (ref 1.7–7.7)
Neutrophils Relative %: 87 %
Platelets: 203 10*3/uL (ref 150–400)
RBC: 3.75 MIL/uL — ABNORMAL LOW (ref 3.87–5.11)
RDW: 14.8 % (ref 11.5–15.5)
WBC: 6 10*3/uL (ref 4.0–10.5)
nRBC: 0 % (ref 0.0–0.2)

## 2020-08-06 LAB — COMPREHENSIVE METABOLIC PANEL
ALT: 23 U/L (ref 0–44)
AST: 16 U/L (ref 15–41)
Albumin: 2.5 g/dL — ABNORMAL LOW (ref 3.5–5.0)
Alkaline Phosphatase: 64 U/L (ref 38–126)
Anion gap: 9 (ref 5–15)
BUN: 10 mg/dL (ref 8–23)
CO2: 25 mmol/L (ref 22–32)
Calcium: 8.5 mg/dL — ABNORMAL LOW (ref 8.9–10.3)
Chloride: 104 mmol/L (ref 98–111)
Creatinine, Ser: 0.53 mg/dL (ref 0.44–1.00)
GFR, Estimated: >60 mL/min (ref >60)
Glucose, Bld: 153 mg/dL — ABNORMAL HIGH (ref 70–99)
Potassium: 4.2 mmol/L (ref 3.5–5.1)
Sodium: 138 mmol/L (ref 135–145)
Total Bilirubin: 0.7 mg/dL (ref 0.3–1.2)
Total Protein: 6 g/dL — ABNORMAL LOW (ref 6.5–8.1)

## 2020-08-06 LAB — MAGNESIUM: Magnesium: 2.1 mg/dL (ref 1.7–2.4)

## 2020-08-06 LAB — BRAIN NATRIURETIC PEPTIDE: B Natriuretic Peptide: 58.9 pg/mL (ref 0.0–100.0)

## 2020-08-06 LAB — C-REACTIVE PROTEIN: CRP: 9.3 mg/dL — ABNORMAL HIGH (ref <1.0)

## 2020-08-06 LAB — D-DIMER, QUANTITATIVE: D-Dimer, Quant: 2.11 ug/mL-FEU — ABNORMAL HIGH (ref 0.00–0.50)

## 2020-08-06 LAB — PROCALCITONIN: Procalcitonin: <0.1 ng/mL

## 2020-08-06 MED ORDER — FUROSEMIDE 10 MG/ML IJ SOLN
40.0000 mg | Freq: Once | INTRAMUSCULAR | Status: AC
  Administered 2020-08-06: 40 mg via INTRAVENOUS
  Filled 2020-08-06: qty 4

## 2020-08-06 NOTE — ED Notes (Signed)
SDU Breakfast Ordered 

## 2020-08-06 NOTE — ED Notes (Signed)
Pt assisted back in bed. SpO2 maintained in low 90's. Pt tolerating well.

## 2020-08-06 NOTE — ED Notes (Signed)
  While moving pt from bed to chair and bedside commode pt O2 will drop and per Dr. Candiss Norse this is to be expected with this pt. Dr. Candiss Norse states before pt does any activity to titrate O2 up and wait about 5 mins to begin. Leave O2 flow at higher rate for about 20 mins after activity for pt to recover.  Dr Candiss Norse aware of pt heart rate in 130's during activity.

## 2020-08-06 NOTE — ED Notes (Signed)
ECHO at bedside.

## 2020-08-06 NOTE — Progress Notes (Addendum)
Patient trasfered from ED to 5W26 via stretcher @ 18:40; alert and oriented x 4; no complaints of pain; left chest port; skin with bruising BUE and cellulitis on LFA (healing); surgical scar on right breast. Patient on oxygen 10 L HFNC. Tele monitor placed per MD order. Orient patient to room and unit; gave patient care guide; instructed how to use the call bell and  fall risk precautions. Will continue to monitor the patient.

## 2020-08-06 NOTE — ED Notes (Signed)
Lunch Tray Ordered @ 1715. 

## 2020-08-06 NOTE — Progress Notes (Signed)
PROGRESS NOTE                                                                                                                                                                                                             Patient Demographics:    Claudia [name], is a 65 y.o. female, DOB - 13-Oct-1955, DDU:[id number]  Outpatient Primary MD for the patient is Pcp, No    LOS - 2  Admit date - 08/04/2020    Chief Complaint  Patient presents with  . Covid Positive       Brief Narrative (HPI from H&P)  Claudia [name] is a 65 y.o. female with medical history significant of hypertension, hyperlipidemia, stage IV breast cancer, anxiety, depression, GERD, and morbid obesity presents back to the hospital with worsening shortness of breath, he was recently admitted at [facility] for COVID-19 pneumonia and discharged home on a steroid taper.  She also had recent diagnosis of left arm cellulitis which has improved considerably.  In the ER she was diagnosed with acute hypoxic respiratory failure, CTA was negative and she was admitted for further treatment.   Subjective:   Patient in bed, denies any headache, no chest or abdominal pain, no focal weakness, shortness of breath has improved.   Assessment  & Plan :     1. Acute Hypoxic Resp. Failure due to Acute Covid 19 Viral Pneumonitis - she fortunately not vaccinated, initially diagnosed with COVID-19 pneumonia on 07/31/2020, finished remdesivir outpatient, was on oral steroid taper but looks like she has failed.  Continue IV steroids, CTA rules out PE.  She has developed mild Rales we will give a challenge of IV Lasix on 08/06/2020, advance activity, monitor closely.  Encouraged the patient to sit up in chair in the daytime use I-S and flutter valve for pulmonary toiletry and then prone in bed when at night.  Will advance activity and titrate down oxygen as possible.   SpO2: 91 % O2  Flow Rate (L/min): 10 L/min FiO2 (%): 100 %  Recent Labs  Lab 07/31/20 0520 07/31/20 0634 08/01/20 0727 08/04/20 0230 08/04/20 0405 08/04/20 0413 08/04/20 0829 08/05/20 0517 08/05/20 0800 08/06/20 0439  WBC 5.4  --  3.4* 10.3  --   --   --  6.0  --  6.0  HGB 11.6*  --  11.1*  11.9*  --   --  10.9* 11.4*  --  10.7*  HCT 37.0  --  36.7 36.0  --   --  32.0* 37.3  --  33.4*  PLT 151  --  155 202  --   --   --  185  --  203  CRP 6.3*  --  3.9*  --   --  6.7*  --  10.0*  --  9.3*  BNP  --   --   --   --   --   --   --   --  36.4 58.9  DDIMER 2.52*  --  2.45*  --   --  2.38*  --  2.20*  --  2.11*  PROCALCITON 0.10  --   --   --  <0.10  --   --   --   --  <0.10  AST 91*  --  40 24  --   --   --  16  --  16  ALT 38  --  36 35  --   --   --  24  --  23  ALKPHOS 88  --  76 74  --   --   --  57  --  64  BILITOT 0.4  --  0.4 0.9  --   --   --  0.8  --  0.7  ALBUMIN 3.3*  --  3.2* 3.0*  --   --   --  2.7*  --  2.5*  LATICACIDVEN 0.8  --   --   --   --   --   --   --   --   --   SARSCOV2NAA  --  POSITIVE*  --   --   --   --   --   --   --   --     2.  Recent left arm cellulitis.  Resolved.  3.  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  Supportive care outpatient follow-up with PCP and oncology.  4.  Hypokalemia.  Replaced and stable.   5.  Anxiety and depression.  On Zoloft and as needed benzodiazepine.  6.  Morbid obesity.  BMI of 42.  Outpatient follow-up with PCP.  7.  Hypertension.  Labetalol as needed for now.  8.  Elevated D-dimer.  Likely due to inflammation, CTA negative, check leg ultrasound.  Now on moderate dose Lovenox.       Condition - Extremely Guarded  Family Communication  :   Horris Latino [phone] on 08/05/2020 at 8:39 AM - voicemail not set up, 08/06/20 8.40 am - message left  Code Status :  Full  Consults  :    Procedures  :    Leg [place]  CTA - 1. No pulmonary emboli. 2. Worsened, more extensive distribution of bilateral  patchy pulmonary infiltrates consistent with worsening viral pneumonia. No dense consolidation, lobar collapse or pleural effusion. 3. Aortic atherosclerosis. Aortic Atherosclerosis  PUD Prophylaxis :  Pepcid  Disposition Plan  :    Status is: Inpatient  Remains inpatient appropriate because:IV treatments appropriate due to intensity of illness or inability to take PO   Dispo: The patient is from: Home              Anticipated d/c is to: Home              Anticipated d/c date is: > 3 days  Patient currently is not medically stable to d/c.   DVT Prophylaxis  :  Lovenox    Lab Results  Component Value Date   PLT 203 08/06/2020    Diet :  Diet Order            Diet regular Room service appropriate? Yes; Fluid consistency: Thin  Diet effective now                  Inpatient Medications  Scheduled Meds: . albuterol  2 puff Inhalation Q6H  . anastrozole  1 mg Oral Daily  . vitamin C  500 mg Oral Daily  . baricitinib  4 mg Oral Daily  . Chlorhexidine Gluconate Cloth  6 each Topical Daily  . enoxaparin (LOVENOX) injection  60 mg Subcutaneous Q12H  . ezetimibe  10 mg Oral Daily  . famotidine  20 mg Oral BID  . feeding supplement  237 mL Oral BID BM  . furosemide  40 mg Intravenous Once  . loratadine  10 mg Oral Daily  . methylPREDNISolone (SOLU-MEDROL) injection  60 mg Intravenous Q12H  . sertraline  250 mg Oral Daily  . sodium chloride flush  3 mL Intravenous Q12H  . zinc sulfate  220 mg Oral Daily   Continuous Infusions:  PRN Meds:.acetaminophen, alprazolam, chlorpheniramine-HYDROcodone, gabapentin, guaiFENesin-dextromethorphan, HYDROcodone-acetaminophen, labetalol, loperamide, [DISCONTINUED] ondansetron **OR** ondansetron (ZOFRAN) IV  Antibiotics  :    Anti-infectives (From admission, onward)   Start     Dose/Rate Route Frequency Ordered Stop   08/05/20 1000  remdesivir 100 mg in sodium chloride 0.9 % 100 mL IVPB  Status:  Discontinued        "Followed by" Linked Group Details   100 mg 200 mL/hr over 30 Minutes Intravenous Daily 08/04/20 0740 08/04/20 0745   08/04/20 1000  remdesivir 100 mg in sodium chloride 0.9 % 100 mL IVPB        100 mg 200 mL/hr over 30 Minutes Intravenous Daily 08/04/20 0745 08/05/20 1215   08/04/20 0745  remdesivir 200 mg in sodium chloride 0.9% 250 mL IVPB  Status:  Discontinued       "Followed by" Linked Group Details   200 mg 580 mL/hr over 30 Minutes Intravenous Once 08/04/20 0740 08/04/20 0745       Time Spent in minutes  30   Lala Lund M.D on 08/06/2020 at 8:37 AM  To page go to [email or web address]   [facility] -  Office  [phone]     See all Orders from today for further details    Objective:   Vitals:   08/06/20 0200 08/06/20 0300 08/06/20 0500 08/06/20 0801  BP: (!) 113/59 114/60 126/62 132/60  Pulse: 72 75 70 74  Resp: (!) 27 (!) 24 (!) 25 (!) 22  Temp:    98.1 F (36.7 C)  TempSrc:    Oral  SpO2: 95% 91% 91% 91%  Weight:      Height:        Wt Readings from Last 3 Encounters:  08/04/20 113.4 kg  07/31/20 113.4 kg  07/23/20 113.4 kg    No intake or output data in the 24 hours ending 08/06/20 0837   Physical Exam  Awake Alert, No new F.N deficits, Normal affect [place].AT,PERRAL Supple Neck,No JVD, No cervical lymphadenopathy appriciated.  Symmetrical Chest wall movement, Good air movement bilaterally, few rales RRR,No Gallops, Rubs or new Murmurs, No Parasternal Heave +ve B.Sounds, Abd Soft, No tenderness, No organomegaly appriciated, No rebound - guarding or rigidity.  No Cyanosis,  L arm mild redness     Data Review:    CBC Recent Labs  Lab 07/31/20 0520 08/01/20 0727 08/04/20 0230 08/04/20 0829 08/05/20 0517 08/06/20 0439  WBC 5.4 3.4* 10.3  --  6.0 6.0  HGB 11.6* 11.1* 11.9* 10.9* 11.4* 10.7*  HCT 37.0 36.7 36.0 32.0* 37.3 33.4*  PLT 151 155 202  --  185 203  MCV 91.4 92.4 87.0  --  90.8 89.1  MCH 28.6 28.0 28.7  --  27.7 28.5   MCHC 31.4 30.2 33.1  --  30.6 32.0  RDW 15.3 15.1 14.7  --  14.9 14.8  LYMPHSABS 0.9 0.7 1.4  --  1.5 0.5*  MONOABS 0.1 0.2 0.5  --  0.3 0.2  EOSABS 0.0 0.0 0.0  --  0.0 0.0  BASOSABS 0.0 0.0 0.0  --  0.0 0.0    Recent Labs  Lab 07/31/20 0520 08/01/20 0727 08/04/20 0230 08/04/20 0405 08/04/20 0413 08/04/20 0829 08/04/20 0830 08/05/20 0517 08/05/20 0800 08/05/20 1740 08/06/20 0439  NA 135 139 140  --   --  138  --  141  --   --  138  K 3.6 4.8 2.7*  --   --  3.3*  --  2.8*  --  3.9 4.2  CL 106 109 104  --   --   --   --  105  --   --  104  CO2 22 22 25   --   --   --   --  25  --   --  25  GLUCOSE 104* 161* 102*  --   --   --   --  92  --   --  153*  BUN 13 11 11   --   --   --   --  12  --   --  10  CREATININE 1.32* 0.62 0.67  --   --   --   --  0.60  --   --  0.53  CALCIUM 8.4* 8.7* 8.7*  --   --   --   --  8.4*  --   --  8.5*  AST 91* 40 24  --   --   --   --  16  --   --  16  ALT 38 36 35  --   --   --   --  24  --   --  23  ALKPHOS 88 76 74  --   --   --   --  57  --   --  64  BILITOT 0.4 0.4 0.9  --   --   --   --  0.8  --   --  0.7  ALBUMIN 3.3* 3.2* 3.0*  --   --   --   --  2.7*  --   --  2.5*  MG 2.2  --   --   --   --   --  1.9 2.1  --   --  2.1  CRP 6.3* 3.9*  --   --  6.7*  --   --  10.0*  --   --  9.3*  DDIMER 2.52* 2.45*  --   --  2.38*  --   --  2.20*  --   --  2.11*  PROCALCITON 0.10  --   --  <0.10  --   --   --   --   --   --  <  0.10  LATICACIDVEN 0.8  --   --   --   --   --   --   --   --   --   --   BNP  --   --   --   --   --   --   --   --  36.4  --  58.9    ------------------------------------------------------------------------------------------------------------------ No results for input(s): CHOL, HDL, LDLCALC, TRIG, CHOLHDL, LDLDIRECT in the last 72 hours.  Lab Results  Component Value Date   HGBA1C 5.4 10/22/2012   ------------------------------------------------------------------------------------------------------------------ No results  for input(s): TSH, T4TOTAL, T3FREE, THYROIDAB in the last 72 hours.  Invalid input(s): FREET3  Cardiac Enzymes No results for input(s): CKMB, TROPONINI, MYOGLOBIN in the last 168 hours.  Invalid input(s): CK ------------------------------------------------------------------------------------------------------------------    Component Value Date/Time   BNP 58.9 08/06/2020 0439    Micro Results Recent Results (from the past 240 hour(s))  Blood Culture (routine x 2)     Status: None   Collection Time: 07/31/20  5:20 AM   Specimen: BLOOD  Result Value Ref Range Status   Specimen Description BLOOD RIGHT ANTECUBITAL  Final   Special Requests   Final    BOTTLES DRAWN AEROBIC AND ANAEROBIC Blood Culture adequate volume   Culture   Final    NO GROWTH 5 DAYS Performed at [facility], [address] [place], [place] [address]    Report Status 08/05/2020 FINAL  Final  Blood Culture (routine x 2)     Status: None   Collection Time: 07/31/20  5:51 AM   Specimen: BLOOD  Result Value Ref Range Status   Specimen Description BLOOD LEFT HAND  Final   Special Requests   Final    BOTTLES DRAWN AEROBIC AND ANAEROBIC Blood Culture adequate volume   Culture   Final    NO GROWTH 5 DAYS Performed at [facility], [address], [place], [place] [address]    Report Status 08/05/2020 FINAL  Final  Resp Panel by RT-PCR (Flu A&B, Covid) Nasopharyngeal Swab     Status: Abnormal   Collection Time: 07/31/20  6:34 AM   Specimen: Nasopharyngeal Swab; Nasopharyngeal(NP) swabs in vial transport medium  Result Value Ref Range Status   SARS Coronavirus 2 by RT PCR POSITIVE (A) NEGATIVE Final    Comment: RESULT CALLED TO, READ BACK BY AND VERIFIED WITH: DOSS,M@0938  by MATTHEWS,B 1.5.22 (NOTE) SARS-CoV-2 target nucleic acids are DETECTED.  The SARS-CoV-2 RNA is generally detectable in upper respiratory specimens during the acute phase of infection. Positive results are indicative of the presence of the  identified virus, but do not rule out bacterial infection or co-infection with other pathogens not detected by the test. Clinical correlation with patient history and other diagnostic information is necessary to determine patient infection status. The expected result is Negative.  Fact Sheet for Patients: EntrepreneurPulse.com.au  Fact Sheet for Healthcare Providers: IncredibleEmployment.be  This test is not yet approved or cleared by the [place] FDA and  has been authorized for detection and/or diagnosis of SARS-CoV-2 by FDA under an Emergency Use Authorization (EUA).  This EUA will remain in effect (meaning this test can be  used) for the duration of  the COVID-19 declaration under Section 564(b)(1) of the Act, 21 U.S.C. section 360bbb-3(b)(1), unless the authorization is terminated or revoked sooner.     Influenza A by PCR NEGATIVE NEGATIVE Final   Influenza B by PCR NEGATIVE NEGATIVE Final    Comment: (NOTE) The Xpert  Xpress SARS-CoV-2/FLU/RSV plus assay is intended as an aid in the diagnosis of influenza from Nasopharyngeal swab specimens and should not be used as a sole basis for treatment. Nasal washings and aspirates are unacceptable for Xpert Xpress SARS-CoV-2/FLU/RSV testing.  Fact Sheet for Patients: EntrepreneurPulse.com.au  Fact Sheet for Healthcare Providers: IncredibleEmployment.be  This test is not yet approved or cleared by the [place] FDA and has been authorized for detection and/or diagnosis of SARS-CoV-2 by FDA under an Emergency Use Authorization (EUA). This EUA will remain in effect (meaning this test can be used) for the duration of the COVID-19 declaration under Section 564(b)(1) of the Act, 21 U.S.C. section 360bbb-3(b)(1), unless the authorization is terminated or revoked.  Performed at [facility], [address], [place], [place] [address]   C Difficile Quick  Screen w PCR reflex     Status: None   Collection Time: 08/04/20  5:49 PM   Specimen: STOOL  Result Value Ref Range Status   C Diff antigen NEGATIVE NEGATIVE Final   C Diff toxin NEGATIVE NEGATIVE Final   C Diff interpretation No C. difficile detected.  Final    Comment: Performed at [facility], [place] [address], [place], [place] [address]    Radiology Reports CT ANGIO CHEST PE W OR WO CONTRAST  Result Date: 08/04/2020 CLINICAL DATA:  Coronavirus infection. Worsening respiratory status and oxygen requirement. Question emboli. EXAM: CT ANGIOGRAPHY CHEST WITH CONTRAST TECHNIQUE: Multidetector CT imaging of the chest was performed using the standard protocol during bolus administration of intravenous contrast. Multiplanar CT image reconstructions and MIPs were obtained to evaluate the vascular anatomy. CONTRAST:  1mL OMNIPAQUE IOHEXOL 350 MG/ML SOLN COMPARISON:  07/31/2020 FINDINGS: Cardiovascular: Heart size is normal. There is aortic atherosclerosis without aneurysm or dissection. Pulmonary arterial opacification is good. There are no pulmonary emboli. Mediastinum/Nodes: No mediastinal or hilar mass or lymphadenopathy. Lungs/Pleura: Bilateral hazy patchy pulmonary infiltrates have worsened in extent since the study 4 days ago. No dense consolidation, lobar collapse or pleural effusion. Upper Abdomen: Negative Musculoskeletal: Mastectomy and reconstruction on the right. Review of the MIP images confirms the above findings. IMPRESSION: 1. No pulmonary emboli. 2. Worsened, more extensive distribution of bilateral patchy pulmonary infiltrates consistent with worsening viral pneumonia. No dense consolidation, lobar collapse or pleural effusion. 3. Aortic atherosclerosis. Aortic Atherosclerosis (ICD10-I70.0). Electronically Signed   By: Nelson Chimes M.D.   On: 08/04/2020 14:00   CT Angio Chest PE W/Cm &/Or Wo Cm  Result Date: 07/31/2020 CLINICAL DATA:  COVID positive. Breast cancer with prior plans to  start chemotherapy today. EXAM: CT ANGIOGRAPHY CHEST WITH CONTRAST TECHNIQUE: Multidetector CT imaging of the chest was performed using the standard protocol during bolus administration of intravenous contrast. Multiplanar CT image reconstructions and MIPs were obtained to evaluate the vascular anatomy. CONTRAST:  116mL OMNIPAQUE IOHEXOL 350 MG/ML SOLN COMPARISON:  11/13/2015.  Outside head CT report 05/07/2020 FINDINGS: Cardiovascular: Cardiomegaly. No pericardial effusion. Aortic and coronary atherosclerotic calcifications. Satisfactory opacification of the pulmonary arteries to at least the segmental level with no convincing pulmonary filling defect when allowing for intermittent streak and motion artifact, motion especially affecting the upper lobes. Mediastinum/Nodes: Negative for adenopathy or mass in the mediastinum. Negative esophagus. There has been right axillary dissection with no axillary adenopathy. 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 Lungs/Pleura: Patchy ground-glass opacity in the bilateral lungs consistent with COVID pneumonia. There is a degree of bronchomalacia with intermittent airway narrowing. Per outside head CT 05/07/2020 there is pulmonary nodularity which  is not assessed in this setting. Upper Abdomen: Negative Musculoskeletal: Trabecular coarsening in the medial right clavicle which is stable from 2017. small sclerotic focus in the upper and posterior sternum, a sternal sclerotic foci was described on outside PET-CT. Review of the MIP images confirms the above findings. IMPRESSION: 1. COVID pattern pneumonia. 2. No evidence of pulmonary embolism. 3. Cardiomegaly. Electronically Signed   By: Monte Fantasia M.D.   On: 07/31/2020 07:22   DG Chest Port 1 View  Result Date: 08/05/2020 CLINICAL DATA:  Shortness of breath and cough in a patient with COVID-19. EXAM: PORTABLE CHEST 1 VIEW COMPARISON:  CT chest and single view of the chest 08/04/2020 and 07/31/2020.  FINDINGS: Left greater than right airspace disease persists appears worse than on the most recent comparison. No pneumothorax. Heart size is upper normal. Port-A-Cath is in place. IMPRESSION: Worsened bilateral airspace disease consistent with pneumonia. Electronically Signed   By: Inge Rise M.D.   On: 08/05/2020 09:01   DG Chest Port 1 View  Result Date: 08/04/2020 CLINICAL DATA:  Shortness of breath.  COVID positive. EXAM: PORTABLE CHEST 1 VIEW COMPARISON:  Radiographs and chest CTA 07/31/2020 FINDINGS: Left chest port remains in place. Bilateral COVID pneumonia with slight improvement in the upper lung zones from prior imaging. Mild unchanged elevation of right hemidiaphragm. No pneumomediastinum or pneumothorax. No significant pleural effusion. Surgical clips in the right axilla. IMPRESSION: Bilateral COVID pneumonia with slight improvement in the upper lung zones from exams 4 days ago. Electronically Signed   By: Keith Rake M.D.   On: 08/04/2020 01:10   DG Chest Port 1 View  Result Date: 07/31/2020 CLINICAL DATA:  COVID.  Hypoxia. EXAM: PORTABLE CHEST 1 VIEW COMPARISON:  09/13/2017. FINDINGS: Port-A-Cath noted with tip over right atrium. Heart size normal. Lung volumes. Diffuse bilateral interstitial infiltrates are noted. Findings consistent pneumonitis. No pleural effusion or pneumothorax. Degenerative changes scoliosis thoracic spine. Degenerative changes both shoulders. No acute bony abnormality. Surgical clips right chest. IMPRESSION: 1. Port-A-Cath noted with tip over right atrium. 2. Low lung volumes. Diffuse bilateral interstitial infiltrates. Findings consistent with pneumonitis. Electronically Signed   By: Marcello Moores  Register   On: 07/31/2020 05:19

## 2020-08-06 NOTE — ED Notes (Signed)
Lunch Tray Ordered @ [id number].

## 2020-08-06 NOTE — ED Notes (Signed)
Refilled sterile water canister for HFNC. Pt instructed on correct breathing techniques to aide in SpO2 levels 90 and above. Pt verbalized understanding. Pt is in no respiratory distressed.

## 2020-08-07 DIAGNOSIS — J9601 Acute respiratory failure with hypoxia: Secondary | ICD-10-CM | POA: Diagnosis not present

## 2020-08-07 DIAGNOSIS — U071 COVID-19: Secondary | ICD-10-CM | POA: Diagnosis not present

## 2020-08-07 LAB — CBC WITH DIFFERENTIAL/PLATELET
Abs Immature Granulocytes: 0.05 10*3/uL (ref 0.00–0.07)
Basophils Absolute: 0 10*3/uL (ref 0.0–0.1)
Basophils Relative: 0 %
Eosinophils Absolute: 0 10*3/uL (ref 0.0–0.5)
Eosinophils Relative: 0 %
HCT: 35.3 % — ABNORMAL LOW (ref 36.0–46.0)
Hemoglobin: 11.3 g/dL — ABNORMAL LOW (ref 12.0–15.0)
Immature Granulocytes: 1 %
Lymphocytes Relative: 5 %
Lymphs Abs: 0.5 10*3/uL — ABNORMAL LOW (ref 0.7–4.0)
MCH: 28 pg (ref 26.0–34.0)
MCHC: 32 g/dL (ref 30.0–36.0)
MCV: 87.4 fL (ref 80.0–100.0)
Monocytes Absolute: 0.5 10*3/uL (ref 0.1–1.0)
Monocytes Relative: 4 %
Neutro Abs: 9.4 10*3/uL — ABNORMAL HIGH (ref 1.7–7.7)
Neutrophils Relative %: 90 %
Platelets: 255 10*3/uL (ref 150–400)
RBC: 4.04 MIL/uL (ref 3.87–5.11)
RDW: 14.8 % (ref 11.5–15.5)
WBC: 10.4 10*3/uL (ref 4.0–10.5)
nRBC: 0 % (ref 0.0–0.2)

## 2020-08-07 LAB — COMPREHENSIVE METABOLIC PANEL
ALT: 22 U/L (ref 0–44)
AST: 14 U/L — ABNORMAL LOW (ref 15–41)
Albumin: 2.8 g/dL — ABNORMAL LOW (ref 3.5–5.0)
Alkaline Phosphatase: 67 U/L (ref 38–126)
Anion gap: 12 (ref 5–15)
BUN: 17 mg/dL (ref 8–23)
CO2: 27 mmol/L (ref 22–32)
Calcium: 9 mg/dL (ref 8.9–10.3)
Chloride: 97 mmol/L — ABNORMAL LOW (ref 98–111)
Creatinine, Ser: 0.65 mg/dL (ref 0.44–1.00)
GFR, Estimated: >60 mL/min (ref >60)
Glucose, Bld: 155 mg/dL — ABNORMAL HIGH (ref 70–99)
Potassium: 4.2 mmol/L (ref 3.5–5.1)
Sodium: 136 mmol/L (ref 135–145)
Total Bilirubin: 0.9 mg/dL (ref 0.3–1.2)
Total Protein: 6.9 g/dL (ref 6.5–8.1)

## 2020-08-07 LAB — D-DIMER, QUANTITATIVE: D-Dimer, Quant: 2.49 ug/mL-FEU — ABNORMAL HIGH (ref 0.00–0.50)

## 2020-08-07 LAB — MRSA PCR SCREENING: MRSA by PCR: NEGATIVE

## 2020-08-07 LAB — PROCALCITONIN: Procalcitonin: <0.1 ng/mL

## 2020-08-07 LAB — MAGNESIUM: Magnesium: 2.1 mg/dL (ref 1.7–2.4)

## 2020-08-07 LAB — C-REACTIVE PROTEIN: CRP: 5.5 mg/dL — ABNORMAL HIGH (ref <1.0)

## 2020-08-07 LAB — BRAIN NATRIURETIC PEPTIDE: B Natriuretic Peptide: 51 pg/mL (ref 0.0–100.0)

## 2020-08-07 NOTE — Progress Notes (Signed)
PROGRESS NOTE                                                                                                                                                                                                             Patient Demographics:    Claudia [name], is a 65 y.o. female, DOB - 01-05-1956, [id number]  Outpatient Primary MD for the patient is Pcp, No    LOS - 3  Admit date - 08/04/2020    Chief Complaint  Patient presents with  . Covid Positive       Brief Narrative (HPI from H&P)  Claudia [name] is a 65 y.o. female with medical history significant of hypertension, hyperlipidemia, stage IV breast cancer, anxiety, depression, GERD, and morbid obesity presents back to the hospital with worsening shortness of breath, he was recently admitted at [facility] for COVID-19 pneumonia and discharged home on a steroid taper.  She also had recent diagnosis of left arm cellulitis which has improved considerably.  In the ER she was diagnosed with acute hypoxic respiratory failure, CTA was negative and she was admitted for further treatment.   Subjective:   Patient in bed, appears comfortable, denies any headache, no fever, no chest pain or pressure, much improved shortness of breath , no abdominal pain. No focal weakness.   Assessment  & Plan :     1. Acute Hypoxic Resp. Failure due to Acute Covid 19 Viral Pneumonitis - she fortunately not vaccinated, initially diagnosed with COVID-19 pneumonia on 07/31/2020, finished remdesivir outpatient, was on oral steroid taper but looks like she has failed.  Continue IV steroids, CTA rules out PE.  Ongoing treatment she has shown good improvement, she is down from 14 L to 7 L of oxygen this morning, will continue to monitor closely.  Encouraged the patient to sit up in chair in the daytime use I-S and flutter valve for pulmonary toiletry and then prone in bed when at night.  Will  advance activity and titrate down oxygen as possible.  SpO2: 96 % O2 Flow Rate (L/min): 7 L/min FiO2 (%): 100 %  Recent Labs  Lab 08/01/20 0727 08/04/20 0230 08/04/20 0405 08/04/20 0413 08/04/20 0829 08/05/20 0517 08/05/20 0800 08/06/20 0439 08/07/20 0122  WBC 3.4* 10.3  --   --   --  6.0  --  6.0  10.4  HGB 11.1* 11.9*  --   --  10.9* 11.4*  --  10.7* 11.3*  HCT 36.7 36.0  --   --  32.0* 37.3  --  33.4* 35.3*  PLT 155 202  --   --   --  185  --  203 255  CRP 3.9*  --   --  6.7*  --  10.0*  --  9.3* 5.5*  BNP  --   --   --   --   --   --  36.4 58.9 51.0  DDIMER 2.45*  --   --  2.38*  --  2.20*  --  2.11* 2.49*  PROCALCITON  --   --  <0.10  --   --   --   --  <0.10 <0.10  AST 40 24  --   --   --  16  --  16 14*  ALT 36 35  --   --   --  24  --  23 22  ALKPHOS 76 74  --   --   --  57  --  64 67  BILITOT 0.4 0.9  --   --   --  0.8  --  0.7 0.9  ALBUMIN 3.2* 3.0*  --   --   --  2.7*  --  2.5* 2.8*    2.  Recent left arm cellulitis.  Resolved.  3.  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  Supportive care outpatient follow-up with PCP and oncology.  4.  Hypokalemia.  Replaced and stable.   5.  Anxiety and depression.  On Zoloft and as needed benzodiazepine.  6.  Morbid obesity.  BMI of 42.  Outpatient follow-up with PCP.  7.  Hypertension.  Labetalol as needed for now.  8.  Elevated D-dimer.  Likely due to inflammation, CTA negative, negative leg ultrasound.  Now on moderate dose Lovenox.       Condition - Extremely Guarded  Family Communication  :   Horris Latino [phone] on 08/05/2020    Code Status :  Full  Consults  :    Procedures  :    Leg [place] -no DVT  CTA - 1. No pulmonary emboli. 2. Worsened, more extensive distribution of bilateral patchy pulmonary infiltrates consistent with worsening viral pneumonia. No dense consolidation, lobar collapse or pleural effusion. 3. Aortic atherosclerosis. Aortic Atherosclerosis  PUD  Prophylaxis :  Pepcid  Disposition Plan  :    Status is: Inpatient  Remains inpatient appropriate because:IV treatments appropriate due to intensity of illness or inability to take PO   Dispo: The patient is from: Home              Anticipated d/c is to: Home              Anticipated d/c date is: > 3 days              Patient currently is not medically stable to d/c.   DVT Prophylaxis  :  Lovenox    Lab Results  Component Value Date   PLT 255 08/07/2020    Diet :  Diet Order            Diet regular Room service appropriate? Yes; Fluid consistency: Thin  Diet effective now                  Inpatient Medications  Scheduled Meds: . albuterol  2 puff  Inhalation Q6H  . anastrozole  1 mg Oral Daily  . vitamin C  500 mg Oral Daily  . baricitinib  4 mg Oral Daily  . Chlorhexidine Gluconate Cloth  6 each Topical Daily  . enoxaparin (LOVENOX) injection  60 mg Subcutaneous Q12H  . ezetimibe  10 mg Oral Daily  . famotidine  20 mg Oral BID  . feeding supplement  237 mL Oral BID BM  . loratadine  10 mg Oral Daily  . methylPREDNISolone (SOLU-MEDROL) injection  60 mg Intravenous Q12H  . sertraline  250 mg Oral Daily  . sodium chloride flush  3 mL Intravenous Q12H  . zinc sulfate  220 mg Oral Daily   Continuous Infusions:  PRN Meds:.acetaminophen, alprazolam, chlorpheniramine-HYDROcodone, gabapentin, guaiFENesin-dextromethorphan, HYDROcodone-acetaminophen, labetalol, loperamide, [DISCONTINUED] ondansetron **OR** ondansetron (ZOFRAN) IV  Antibiotics  :    Anti-infectives (From admission, onward)   Start     Dose/Rate Route Frequency Ordered Stop   08/05/20 1000  remdesivir 100 mg in sodium chloride 0.9 % 100 mL IVPB  Status:  Discontinued       "Followed by" Linked Group Details   100 mg 200 mL/hr over 30 Minutes Intravenous Daily 08/04/20 0740 08/04/20 0745   08/04/20 1000  remdesivir 100 mg in sodium chloride 0.9 % 100 mL IVPB        100 mg 200 mL/hr over 30 Minutes  Intravenous Daily 08/04/20 0745 08/05/20 1215   08/04/20 0745  remdesivir 200 mg in sodium chloride 0.9% 250 mL IVPB  Status:  Discontinued       "Followed by" Linked Group Details   200 mg 580 mL/hr over 30 Minutes Intravenous Once 08/04/20 0740 08/04/20 0745       Time Spent in minutes  30   Lala Lund M.D on 08/07/2020 at 10:08 AM  To page go to [email or web address]   [facility] -  Office  [phone]     See all Orders from today for further details    Objective:   Vitals:   08/06/20 2037 08/07/20 0000 08/07/20 0437 08/07/20 0802  BP: 137/75 (!) 156/87 (!) 142/77 (!) 153/83  Pulse: 97 80  79  Resp: 18 20 20 20   Temp: 97.8 F (36.6 C) 97.8 F (36.6 C) 98 F (36.7 C) 98.1 F (36.7 C)  TempSrc: Oral Oral Oral Oral  SpO2: 96% 96% 92% 96%  Weight:      Height:        Wt Readings from Last 3 Encounters:  08/06/20 112.8 kg  07/31/20 113.4 kg  07/23/20 113.4 kg     Intake/Output Summary (Last 24 hours) at 08/07/2020 1008 Last data filed at 08/07/2020 0905 Gross per 24 hour  Intake 600 ml  Output 400 ml  Net 200 ml     Physical Exam  Awake Alert, No new F.N deficits, Normal affect [place].AT,PERRAL Supple Neck,No JVD, No cervical lymphadenopathy appriciated.  Symmetrical Chest wall movement, Good air movement bilaterally, CTAB RRR,No Gallops, Rubs or new Murmurs, No Parasternal Heave +ve B.Sounds, Abd Soft, No tenderness, No organomegaly appriciated, No rebound - guarding or rigidity. No Cyanosis, minimal redness in the left arm.    Data Review:    CBC Recent Labs  Lab 08/01/20 0727 08/04/20 0230 08/04/20 0829 08/05/20 0517 08/06/20 0439 08/07/20 0122  WBC 3.4* 10.3  --  6.0 6.0 10.4  HGB 11.1* 11.9* 10.9* 11.4* 10.7* 11.3*  HCT 36.7 36.0 32.0* 37.3 33.4* 35.3*  PLT 155 202  --  185 203 255  MCV 92.4 87.0  --  90.8 89.1 87.4  MCH 28.0 28.7  --  27.7 28.5 28.0  MCHC 30.2 33.1  --  30.6 32.0 32.0  RDW 15.1 14.7  --  14.9 14.8 14.8   LYMPHSABS 0.7 1.4  --  1.5 0.5* 0.5*  MONOABS 0.2 0.5  --  0.3 0.2 0.5  EOSABS 0.0 0.0  --  0.0 0.0 0.0  BASOSABS 0.0 0.0  --  0.0 0.0 0.0    Recent Labs  Lab 08/01/20 0727 08/04/20 0230 08/04/20 0405 08/04/20 0413 08/04/20 0829 08/04/20 0830 08/05/20 0517 08/05/20 0800 08/05/20 1740 08/06/20 0439 08/07/20 0122  NA 139 140  --   --  138  --  141  --   --  138 136  K 4.8 2.7*  --   --  3.3*  --  2.8*  --  3.9 4.2 4.2  CL 109 104  --   --   --   --  105  --   --  104 97*  CO2 22 25  --   --   --   --  25  --   --  25 27  GLUCOSE 161* 102*  --   --   --   --  92  --   --  153* 155*  BUN 11 11  --   --   --   --  12  --   --  10 17  CREATININE 0.62 0.67  --   --   --   --  0.60  --   --  0.53 0.65  CALCIUM 8.7* 8.7*  --   --   --   --  8.4*  --   --  8.5* 9.0  AST 40 24  --   --   --   --  16  --   --  16 14*  ALT 36 35  --   --   --   --  24  --   --  23 22  ALKPHOS 76 74  --   --   --   --  57  --   --  64 67  BILITOT 0.4 0.9  --   --   --   --  0.8  --   --  0.7 0.9  ALBUMIN 3.2* 3.0*  --   --   --   --  2.7*  --   --  2.5* 2.8*  MG  --   --   --   --   --  1.9 2.1  --   --  2.1 2.1  CRP 3.9*  --   --  6.7*  --   --  10.0*  --   --  9.3* 5.5*  DDIMER 2.45*  --   --  2.38*  --   --  2.20*  --   --  2.11* 2.49*  PROCALCITON  --   --  <0.10  --   --   --   --   --   --  <0.10 <0.10  BNP  --   --   --   --   --   --   --  36.4  --  58.9 51.0    ------------------------------------------------------------------------------------------------------------------ No results for input(s): CHOL, HDL, LDLCALC, TRIG, CHOLHDL, LDLDIRECT in the last 72 hours.  Lab Results  Component Value Date   HGBA1C 5.4 10/22/2012   ------------------------------------------------------------------------------------------------------------------ No results for  input(s): TSH, T4TOTAL, T3FREE, THYROIDAB in the last 72 hours.  Invalid input(s): FREET3  Cardiac Enzymes No results for input(s):  CKMB, TROPONINI, MYOGLOBIN in the last 168 hours.  Invalid input(s): CK ------------------------------------------------------------------------------------------------------------------    Component Value Date/Time   BNP 51.0 08/07/2020 0122    Micro Results Recent Results (from the past 240 hour(s))  Blood Culture (routine x 2)     Status: None   Collection Time: 07/31/20  5:20 AM   Specimen: BLOOD  Result Value Ref Range Status   Specimen Description BLOOD RIGHT ANTECUBITAL  Final   Special Requests   Final    BOTTLES DRAWN AEROBIC AND ANAEROBIC Blood Culture adequate volume   Culture   Final    NO GROWTH 5 DAYS Performed at [facility], [address], [place], [place] [address]    Report Status 08/05/2020 FINAL  Final  Blood Culture (routine x 2)     Status: None   Collection Time: 07/31/20  5:51 AM   Specimen: BLOOD  Result Value Ref Range Status   Specimen Description BLOOD LEFT HAND  Final   Special Requests   Final    BOTTLES DRAWN AEROBIC AND ANAEROBIC Blood Culture adequate volume   Culture   Final    NO GROWTH 5 DAYS Performed at [facility], [address] [place], [place] [address]    Report Status 08/05/2020 FINAL  Final  Resp Panel by RT-PCR (Flu A&B, Covid) Nasopharyngeal Swab     Status: Abnormal   Collection Time: 07/31/20  6:34 AM   Specimen: Nasopharyngeal Swab; Nasopharyngeal(NP) swabs in vial transport medium  Result Value Ref Range Status   SARS Coronavirus 2 by RT PCR POSITIVE (A) NEGATIVE Final    Comment: RESULT CALLED TO, READ BACK BY AND VERIFIED WITH: DOSS,M@0938  by MATTHEWS,B 1.5.22 (NOTE) SARS-CoV-2 target nucleic acids are DETECTED.  The SARS-CoV-2 RNA is generally detectable in upper respiratory specimens during the acute phase of infection. Positive results are indicative of the presence of the identified virus, but do not rule out bacterial infection or co-infection with other pathogens not detected by the test. Clinical  correlation with patient history and other diagnostic information is necessary to determine patient infection status. The expected result is Negative.  Fact Sheet for Patients: EntrepreneurPulse.com.au  Fact Sheet for Healthcare Providers: IncredibleEmployment.be  This test is not yet approved or cleared by the [place] FDA and  has been authorized for detection and/or diagnosis of SARS-CoV-2 by FDA under an Emergency Use Authorization (EUA).  This EUA will remain in effect (meaning this test can be  used) for the duration of  the COVID-19 declaration under Section 564(b)(1) of the Act, 21 U.S.C. section 360bbb-3(b)(1), unless the authorization is terminated or revoked sooner.     Influenza A by PCR NEGATIVE NEGATIVE Final   Influenza B by PCR NEGATIVE NEGATIVE Final    Comment: (NOTE) The Xpert Xpress SARS-CoV-2/FLU/RSV plus assay is intended as an aid in the diagnosis of influenza from Nasopharyngeal swab specimens and should not be used as a sole basis for treatment. Nasal washings and aspirates are unacceptable for Xpert Xpress SARS-CoV-2/FLU/RSV testing.  Fact Sheet for Patients: EntrepreneurPulse.com.au  Fact Sheet for Healthcare Providers: IncredibleEmployment.be  This test is not yet approved or cleared by the [place] FDA and has been authorized for detection and/or diagnosis of SARS-CoV-2 by FDA under an Emergency Use Authorization (EUA). This EUA will remain in effect (meaning this test can be used) for the duration of the  COVID-19 declaration under Section 564(b)(1) of the Act, 21 U.S.C. section 360bbb-3(b)(1), unless the authorization is terminated or revoked.  Performed at [facility], [address], [place], [place] [address]   C Difficile Quick Screen w PCR reflex     Status: None   Collection Time: 08/04/20  5:49 PM   Specimen: STOOL  Result Value Ref Range Status   C Diff  antigen NEGATIVE NEGATIVE Final   C Diff toxin NEGATIVE NEGATIVE Final   C Diff interpretation No C. difficile detected.  Final    Comment: Performed at [facility], [place] [address], [place], [place] [address]  MRSA PCR Screening     Status: None   Collection Time: 08/07/20  2:07 AM  Result Value Ref Range Status   MRSA by PCR NEGATIVE NEGATIVE Final    Comment:        The GeneXpert MRSA Assay (FDA approved for NASAL specimens only), is one component of a comprehensive MRSA colonization surveillance program. It is not intended to diagnose MRSA infection nor to guide or monitor treatment for MRSA infections. Performed at [facility], [place] [address], [place], [place] [address]     Radiology Reports CT ANGIO CHEST PE W OR WO CONTRAST  Result Date: 08/04/2020 CLINICAL DATA:  Coronavirus infection. Worsening respiratory status and oxygen requirement. Question emboli. EXAM: CT ANGIOGRAPHY CHEST WITH CONTRAST TECHNIQUE: Multidetector CT imaging of the chest was performed using the standard protocol during bolus administration of intravenous contrast. Multiplanar CT image reconstructions and MIPs were obtained to evaluate the vascular anatomy. CONTRAST:  57mL OMNIPAQUE IOHEXOL 350 MG/ML SOLN COMPARISON:  07/31/2020 FINDINGS: Cardiovascular: Heart size is normal. There is aortic atherosclerosis without aneurysm or dissection. Pulmonary arterial opacification is good. There are no pulmonary emboli. Mediastinum/Nodes: No mediastinal or hilar mass or lymphadenopathy. Lungs/Pleura: Bilateral hazy patchy pulmonary infiltrates have worsened in extent since the study 4 days ago. No dense consolidation, lobar collapse or pleural effusion. Upper Abdomen: Negative Musculoskeletal: Mastectomy and reconstruction on the right. Review of the MIP images confirms the above findings. IMPRESSION: 1. No pulmonary emboli. 2. Worsened, more extensive distribution of bilateral patchy pulmonary infiltrates  consistent with worsening viral pneumonia. No dense consolidation, lobar collapse or pleural effusion. 3. Aortic atherosclerosis. Aortic Atherosclerosis (ICD10-I70.0). Electronically Signed   By: Nelson Chimes M.D.   On: 08/04/2020 14:00   CT Angio Chest PE W/Cm &/Or Wo Cm  Result Date: 07/31/2020 CLINICAL DATA:  COVID positive. Breast cancer with prior plans to start chemotherapy today. EXAM: CT ANGIOGRAPHY CHEST WITH CONTRAST TECHNIQUE: Multidetector CT imaging of the chest was performed using the standard protocol during bolus administration of intravenous contrast. Multiplanar CT image reconstructions and MIPs were obtained to evaluate the vascular anatomy. CONTRAST:  171mL OMNIPAQUE IOHEXOL 350 MG/ML SOLN COMPARISON:  11/13/2015.  Outside head CT report 05/07/2020 FINDINGS: Cardiovascular: Cardiomegaly. No pericardial effusion. Aortic and coronary atherosclerotic calcifications. Satisfactory opacification of the pulmonary arteries to at least the segmental level with no convincing pulmonary filling defect when allowing for intermittent streak and motion artifact, motion especially affecting the upper lobes. Mediastinum/Nodes: Negative for adenopathy or mass in the mediastinum. Negative esophagus. There has been right axillary dissection with no axillary adenopathy. 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 Lungs/Pleura: Patchy ground-glass opacity in the bilateral lungs consistent with COVID pneumonia. There is a degree of bronchomalacia with intermittent airway narrowing. Per outside head CT 05/07/2020 there is pulmonary nodularity which is not assessed in this setting. Upper Abdomen:  Negative Musculoskeletal: Trabecular coarsening in the medial right clavicle which is stable from 2017. small sclerotic focus in the upper and posterior sternum, a sternal sclerotic foci was described on outside PET-CT. Review of the MIP images confirms the above findings. IMPRESSION: 1. COVID pattern pneumonia.  2. No evidence of pulmonary embolism. 3. Cardiomegaly. Electronically Signed   By: Monte Fantasia M.D.   On: 07/31/2020 07:22   DG Chest Port 1 View  Result Date: 08/05/2020 CLINICAL DATA:  Shortness of breath and cough in a patient with COVID-19. EXAM: PORTABLE CHEST 1 VIEW COMPARISON:  CT chest and single view of the chest 08/04/2020 and 07/31/2020. FINDINGS: Left greater than right airspace disease persists appears worse than on the most recent comparison. No pneumothorax. Heart size is upper normal. Port-A-Cath is in place. IMPRESSION: Worsened bilateral airspace disease consistent with pneumonia. Electronically Signed   By: Inge Rise M.D.   On: 08/05/2020 09:01   DG Chest Port 1 View  Result Date: 08/04/2020 CLINICAL DATA:  Shortness of breath.  COVID positive. EXAM: PORTABLE CHEST 1 VIEW COMPARISON:  Radiographs and chest CTA 07/31/2020 FINDINGS: Left chest port remains in place. Bilateral COVID pneumonia with slight improvement in the upper lung zones from prior imaging. Mild unchanged elevation of right hemidiaphragm. No pneumomediastinum or pneumothorax. No significant pleural effusion. Surgical clips in the right axilla. IMPRESSION: Bilateral COVID pneumonia with slight improvement in the upper lung zones from exams 4 days ago. Electronically Signed   By: Keith Rake M.D.   On: 08/04/2020 01:10   DG Chest Port 1 View  Result Date: 07/31/2020 CLINICAL DATA:  COVID.  Hypoxia. EXAM: PORTABLE CHEST 1 VIEW COMPARISON:  09/13/2017. FINDINGS: Port-A-Cath noted with tip over right atrium. Heart size normal. Lung volumes. Diffuse bilateral interstitial infiltrates are noted. Findings consistent pneumonitis. No pleural effusion or pneumothorax. Degenerative changes scoliosis thoracic spine. Degenerative changes both shoulders. No acute bony abnormality. Surgical clips right chest. IMPRESSION: 1. Port-A-Cath noted with tip over right atrium. 2. Low lung volumes. Diffuse bilateral interstitial  infiltrates. Findings consistent with pneumonitis. Electronically Signed   By: Marcello Moores  Register   On: 07/31/2020 05:19   VAS [place] LOWER EXTREMITY VENOUS (DVT)  Result Date: 08/06/2020  Lower Venous DVT Study Other Indications: Covid+ "rapidly rising" d-dimer. Comparison Study: No prior venous study. Performing Technologist: Rogelia Rohrer  Examination Guidelines: A complete evaluation includes B-mode imaging, spectral Doppler, color Doppler, and power Doppler as needed of all accessible portions of each vessel. Bilateral testing is considered an integral part of a complete examination. Limited examinations for reoccurring indications may be performed as noted. The reflux portion of the exam is performed with the patient in reverse Trendelenburg.  +---------+---------------+---------+-----------+----------+--------------+ RIGHT    CompressibilityPhasicitySpontaneityPropertiesThrombus Aging +---------+---------------+---------+-----------+----------+--------------+ CFV      Full           Yes      Yes                                 +---------+---------------+---------+-----------+----------+--------------+ SFJ      Full                                                        +---------+---------------+---------+-----------+----------+--------------+ FV Prox  Full  Yes      Yes                                 +---------+---------------+---------+-----------+----------+--------------+ FV Mid   Full           Yes      Yes                                 +---------+---------------+---------+-----------+----------+--------------+ FV DistalFull           Yes      Yes                                 +---------+---------------+---------+-----------+----------+--------------+ PFV      Full                                                        +---------+---------------+---------+-----------+----------+--------------+ POP      Full           Yes      Yes                                  +---------+---------------+---------+-----------+----------+--------------+ PTV      Full                                                        +---------+---------------+---------+-----------+----------+--------------+ PERO     Full                                                        +---------+---------------+---------+-----------+----------+--------------+   +---------+---------------+---------+-----------+----------+--------------+ LEFT     CompressibilityPhasicitySpontaneityPropertiesThrombus Aging +---------+---------------+---------+-----------+----------+--------------+ CFV      Full           Yes      Yes                                 +---------+---------------+---------+-----------+----------+--------------+ SFJ      Full                                                        +---------+---------------+---------+-----------+----------+--------------+ FV Prox  Full           Yes      Yes                                 +---------+---------------+---------+-----------+----------+--------------+ FV Mid   Full           Yes      Yes                                 +---------+---------------+---------+-----------+----------+--------------+  FV DistalFull           Yes      Yes                                 +---------+---------------+---------+-----------+----------+--------------+ PFV      Full                                                        +---------+---------------+---------+-----------+----------+--------------+ POP      Full           Yes      Yes                                 +---------+---------------+---------+-----------+----------+--------------+ PTV      Full                                                        +---------+---------------+---------+-----------+----------+--------------+ PERO     Full                                                         +---------+---------------+---------+-----------+----------+--------------+     Summary: BILATERAL: - No evidence of deep vein thrombosis seen in the lower extremities, bilaterally. - RIGHT: - No cystic structure found in the popliteal fossa.  LEFT: - No cystic structure found in the popliteal fossa.  *See table(s) above for measurements and observations. Electronically signed by Deitra Mayo MD on 08/06/2020 at 6:26:48 PM.    Final

## 2020-08-08 DIAGNOSIS — J9601 Acute respiratory failure with hypoxia: Secondary | ICD-10-CM | POA: Diagnosis not present

## 2020-08-08 DIAGNOSIS — U071 COVID-19: Secondary | ICD-10-CM | POA: Diagnosis not present

## 2020-08-08 LAB — COMPREHENSIVE METABOLIC PANEL
ALT: 21 U/L (ref 0–44)
AST: 16 U/L (ref 15–41)
Albumin: 2.6 g/dL — ABNORMAL LOW (ref 3.5–5.0)
Alkaline Phosphatase: 61 U/L (ref 38–126)
Anion gap: 10 (ref 5–15)
BUN: 18 mg/dL (ref 8–23)
CO2: 29 mmol/L (ref 22–32)
Calcium: 8.6 mg/dL — ABNORMAL LOW (ref 8.9–10.3)
Chloride: 97 mmol/L — ABNORMAL LOW (ref 98–111)
Creatinine, Ser: 0.84 mg/dL (ref 0.44–1.00)
GFR, Estimated: >60 mL/min (ref >60)
Glucose, Bld: 194 mg/dL — ABNORMAL HIGH (ref 70–99)
Potassium: 4.3 mmol/L (ref 3.5–5.1)
Sodium: 136 mmol/L (ref 135–145)
Total Bilirubin: 0.7 mg/dL (ref 0.3–1.2)
Total Protein: 6.4 g/dL — ABNORMAL LOW (ref 6.5–8.1)

## 2020-08-08 LAB — BRAIN NATRIURETIC PEPTIDE: B Natriuretic Peptide: 40 pg/mL (ref 0.0–100.0)

## 2020-08-08 LAB — CBC WITH DIFFERENTIAL/PLATELET
Abs Immature Granulocytes: 0.06 10*3/uL (ref 0.00–0.07)
Basophils Absolute: 0 10*3/uL (ref 0.0–0.1)
Basophils Relative: 0 %
Eosinophils Absolute: 0 10*3/uL (ref 0.0–0.5)
Eosinophils Relative: 0 %
HCT: 33.4 % — ABNORMAL LOW (ref 36.0–46.0)
Hemoglobin: 11 g/dL — ABNORMAL LOW (ref 12.0–15.0)
Immature Granulocytes: 1 %
Lymphocytes Relative: 6 %
Lymphs Abs: 0.7 10*3/uL (ref 0.7–4.0)
MCH: 29 pg (ref 26.0–34.0)
MCHC: 32.9 g/dL (ref 30.0–36.0)
MCV: 88.1 fL (ref 80.0–100.0)
Monocytes Absolute: 0.4 10*3/uL (ref 0.1–1.0)
Monocytes Relative: 4 %
Neutro Abs: 10.7 10*3/uL — ABNORMAL HIGH (ref 1.7–7.7)
Neutrophils Relative %: 89 %
Platelets: 235 10*3/uL (ref 150–400)
RBC: 3.79 MIL/uL — ABNORMAL LOW (ref 3.87–5.11)
RDW: 15 % (ref 11.5–15.5)
WBC: 11.8 10*3/uL — ABNORMAL HIGH (ref 4.0–10.5)
nRBC: 0 % (ref 0.0–0.2)

## 2020-08-08 LAB — PROCALCITONIN: Procalcitonin: <0.1 ng/mL

## 2020-08-08 LAB — C-REACTIVE PROTEIN: CRP: 2 mg/dL — ABNORMAL HIGH (ref <1.0)

## 2020-08-08 LAB — D-DIMER, QUANTITATIVE: D-Dimer, Quant: 2.15 ug/mL-FEU — ABNORMAL HIGH (ref 0.00–0.50)

## 2020-08-08 LAB — MAGNESIUM: Magnesium: 2.2 mg/dL (ref 1.7–2.4)

## 2020-08-08 MED ORDER — ALUM & MAG HYDROXIDE-SIMETH 200-200-20 MG/5ML PO SUSP
30.0000 mL | ORAL | Status: DC | PRN
  Administered 2020-08-08: 30 mL via ORAL
  Filled 2020-08-08: qty 30

## 2020-08-08 NOTE — Progress Notes (Signed)
PROGRESS NOTE                                                                                                                                                                                                             Patient Demographics:    Claudia [name], is a 65 y.o. female, DOB - Dec 09, 1955, [id number]  Outpatient Primary MD for the patient is Pcp, No    LOS - 4  Admit date - 08/04/2020    Chief Complaint  Patient presents with  . Covid Positive       Brief Narrative (HPI from H&P)  Claudia [name] is a 65 y.o. female with medical history significant of hypertension, hyperlipidemia, stage IV breast cancer, anxiety, depression, GERD, and morbid obesity presents back to the hospital with worsening shortness of breath, he was recently admitted at [facility] for COVID-19 pneumonia and discharged home on a steroid taper.  She also had recent diagnosis of left arm cellulitis which has improved considerably.  In the ER she was diagnosed with acute hypoxic respiratory failure, CTA was negative and she was admitted for further treatment.   Subjective:   Patient in bed, appears comfortable, denies any headache, no fever, no chest pain or pressure, improved shortness of breath , no abdominal pain. No focal weakness.   Assessment  & Plan :     1. Acute Hypoxic Resp. Failure due to Acute Covid 19 Viral Pneumonitis - she fortunately not vaccinated, initially diagnosed with COVID-19 pneumonia on 07/31/2020, finished remdesivir outpatient, was on oral steroid taper but looks like she has failed.  Continue IV steroids, CTA rules out PE.  Ongoing treatment she has shown good improvement, she is down from 14 L to 6-7 L of oxygen this morning, will continue to monitor closely.  Encouraged the patient to sit up in chair in the daytime use I-S and flutter valve for pulmonary toiletry and then prone in bed when at night.  Will advance  activity and titrate down oxygen as possible.  SpO2: 90 % O2 Flow Rate (L/min): 7 L/min FiO2 (%): 100 %  Recent Labs  Lab 08/04/20 0230 08/04/20 0405 08/04/20 0413 08/04/20 0829 08/05/20 0517 08/05/20 0800 08/06/20 0439 08/07/20 0122 08/08/20 0220  WBC 10.3  --   --   --  6.0  --  6.0 10.4 11.8*  HGB 11.9*  --   --  10.9* 11.4*  --  10.7* 11.3* 11.0*  HCT 36.0  --   --  32.0* 37.3  --  33.4* 35.3* 33.4*  PLT 202  --   --   --  185  --  203 255 235  CRP  --   --  6.7*  --  10.0*  --  9.3* 5.5* 2.0*  BNP  --   --   --   --   --  36.4 58.9 51.0 40.0  DDIMER  --   --  2.38*  --  2.20*  --  2.11* 2.49* 2.15*  PROCALCITON  --  <0.10  --   --   --   --  <0.10 <0.10 <0.10  AST 24  --   --   --  16  --  16 14* 16  ALT 35  --   --   --  24  --  23 22 21   ALKPHOS 74  --   --   --  57  --  64 67 61  BILITOT 0.9  --   --   --  0.8  --  0.7 0.9 0.7  ALBUMIN 3.0*  --   --   --  2.7*  --  2.5* 2.8* 2.6*    2.  Recent left arm cellulitis.  Resolved.  3.  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  Supportive care outpatient follow-up with PCP and oncology.  4.  Hypokalemia.  Replaced and stable.   5.  Anxiety and depression.  On Zoloft and as needed benzodiazepine.  6.  Morbid obesity.  BMI of 42.  Outpatient follow-up with PCP.  7.  Hypertension.  Labetalol as needed for now.  8.  Elevated D-dimer.  Likely due to inflammation, CTA negative, negative leg ultrasound.  Now on moderate dose Lovenox.       Condition - Extremely Guarded  Family Communication  :   Horris Latino [phone] on 08/05/2020, 08/08/2020  Code Status :  Full  Consults  :    Procedures  :    Leg [place] - no DVT  CTA - 1. No pulmonary emboli. 2. Worsened, more extensive distribution of bilateral patchy pulmonary infiltrates consistent with worsening viral pneumonia. No dense consolidation, lobar collapse or pleural effusion. 3. Aortic atherosclerosis. Aortic Atherosclerosis  PUD  Prophylaxis :  Pepcid  Disposition Plan  :    Status is: Inpatient  Remains inpatient appropriate because:IV treatments appropriate due to intensity of illness or inability to take PO   Dispo: The patient is from: Home              Anticipated d/c is to: Home              Anticipated d/c date is: > 3 days              Patient currently is not medically stable to d/c.   DVT Prophylaxis  :  Lovenox    Lab Results  Component Value Date   PLT 235 08/08/2020    Diet :  Diet Order            Diet regular Room service appropriate? Yes; Fluid consistency: Thin  Diet effective now                  Inpatient Medications  Scheduled Meds: . albuterol  2 puff Inhalation Q6H  . anastrozole  1 mg Oral Daily  . vitamin C  500 mg Oral Daily  . baricitinib  4 mg Oral Daily  . Chlorhexidine Gluconate Cloth  6 each Topical Daily  . enoxaparin (LOVENOX) injection  60 mg Subcutaneous Q12H  . ezetimibe  10 mg Oral Daily  . famotidine  20 mg Oral BID  . feeding supplement  237 mL Oral BID BM  . loratadine  10 mg Oral Daily  . methylPREDNISolone (SOLU-MEDROL) injection  60 mg Intravenous Q12H  . sertraline  250 mg Oral Daily  . sodium chloride flush  3 mL Intravenous Q12H  . zinc sulfate  220 mg Oral Daily   Continuous Infusions:  PRN Meds:.acetaminophen, alprazolam, chlorpheniramine-HYDROcodone, gabapentin, guaiFENesin-dextromethorphan, HYDROcodone-acetaminophen, labetalol, loperamide, [DISCONTINUED] ondansetron **OR** ondansetron (ZOFRAN) IV  Antibiotics  :    Anti-infectives (From admission, onward)   Start     Dose/Rate Route Frequency Ordered Stop   08/05/20 1000  remdesivir 100 mg in sodium chloride 0.9 % 100 mL IVPB  Status:  Discontinued       "Followed by" Linked Group Details   100 mg 200 mL/hr over 30 Minutes Intravenous Daily 08/04/20 0740 08/04/20 0745   08/04/20 1000  remdesivir 100 mg in sodium chloride 0.9 % 100 mL IVPB        100 mg 200 mL/hr over 30 Minutes  Intravenous Daily 08/04/20 0745 08/05/20 1215   08/04/20 0745  remdesivir 200 mg in sodium chloride 0.9% 250 mL IVPB  Status:  Discontinued       "Followed by" Linked Group Details   200 mg 580 mL/hr over 30 Minutes Intravenous Once 08/04/20 0740 08/04/20 0745       Time Spent in minutes  30   Lala Lund M.D on 08/08/2020 at 9:43 AM  To page go to [email or web address]   [facility] -  Office  [phone]     See all Orders from today for further details    Objective:   Vitals:   08/07/20 2007 08/07/20 2341 08/08/20 0403 08/08/20 0735  BP: (!) 160/76 (!) 134/101 (!) 141/77 130/82  Pulse: 100 73 92 88  Resp: 15 20 19 20   Temp: 98.7 F (37.1 C) 98.9 F (37.2 C) 98.7 F (37.1 C) 98.1 F (36.7 C)  TempSrc: Axillary Axillary Axillary Axillary  SpO2: 90% 90% 92% 90%  Weight:      Height:        Wt Readings from Last 3 Encounters:  08/06/20 112.8 kg  07/31/20 113.4 kg  07/23/20 113.4 kg     Intake/Output Summary (Last 24 hours) at 08/08/2020 0943 Last data filed at 08/08/2020 0900 Gross per 24 hour  Intake 180 ml  Output 675 ml  Net -495 ml     Physical Exam  Awake Alert, No new F.N deficits, Normal affect [place].AT,PERRAL Supple Neck,No JVD, No cervical lymphadenopathy appriciated.  Symmetrical Chest wall movement, Good air movement bilaterally, CTAB RRR,No Gallops, Rubs or new Murmurs, No Parasternal Heave +ve B.Sounds, Abd Soft, No tenderness, No organomegaly appriciated, No rebound - guarding or rigidity. No Cyanosis,  minimal redness in the left arm.    Data Review:    CBC Recent Labs  Lab 08/04/20 0230 08/04/20 0829 08/05/20 0517 08/06/20 0439 08/07/20 0122 08/08/20 0220  WBC 10.3  --  6.0 6.0 10.4 11.8*  HGB 11.9* 10.9* 11.4* 10.7* 11.3* 11.0*  HCT 36.0 32.0* 37.3 33.4* 35.3* 33.4*  PLT 202  --  185 203 255 235  MCV 87.0  --  90.8 89.1 87.4 88.1  MCH 28.7  --  27.7 28.5 28.0 29.0  MCHC 33.1  --  30.6 32.0 32.0 32.9  RDW 14.7  --  14.9  14.8 14.8 15.0  LYMPHSABS 1.4  --  1.5 0.5* 0.5* 0.7  MONOABS 0.5  --  0.3 0.2 0.5 0.4  EOSABS 0.0  --  0.0 0.0 0.0 0.0  BASOSABS 0.0  --  0.0 0.0 0.0 0.0    Recent Labs  Lab 08/04/20 0230 08/04/20 0405 08/04/20 0413 08/04/20 0829 08/04/20 0830 08/05/20 0517 08/05/20 0800 08/05/20 1740 08/06/20 0439 08/07/20 0122 08/08/20 0220  NA 140  --   --  138  --  141  --   --  138 136 136  K 2.7*  --   --  3.3*  --  2.8*  --  3.9 4.2 4.2 4.3  CL 104  --   --   --   --  105  --   --  104 97* 97*  CO2 25  --   --   --   --  25  --   --  25 27 29   GLUCOSE 102*  --   --   --   --  92  --   --  153* 155* 194*  BUN 11  --   --   --   --  12  --   --  10 17 18   CREATININE 0.67  --   --   --   --  0.60  --   --  0.53 0.65 0.84  CALCIUM 8.7*  --   --   --   --  8.4*  --   --  8.5* 9.0 8.6*  AST 24  --   --   --   --  16  --   --  16 14* 16  ALT 35  --   --   --   --  24  --   --  23 22 21   ALKPHOS 74  --   --   --   --  57  --   --  64 67 61  BILITOT 0.9  --   --   --   --  0.8  --   --  0.7 0.9 0.7  ALBUMIN 3.0*  --   --   --   --  2.7*  --   --  2.5* 2.8* 2.6*  MG  --   --   --   --  1.9 2.1  --   --  2.1 2.1 2.2  CRP  --   --  6.7*  --   --  10.0*  --   --  9.3* 5.5* 2.0*  DDIMER  --   --  2.38*  --   --  2.20*  --   --  2.11* 2.49* 2.15*  PROCALCITON  --  <0.10  --   --   --   --   --   --  <0.10 <0.10 <0.10  BNP  --   --   --   --   --   --  36.4  --  58.9 51.0 40.0    ------------------------------------------------------------------------------------------------------------------ No results for input(s): CHOL, HDL, LDLCALC, TRIG, CHOLHDL, LDLDIRECT in the last 72 hours.  Lab Results  Component Value Date   HGBA1C 5.4 10/22/2012   ------------------------------------------------------------------------------------------------------------------ No results for input(s): TSH, T4TOTAL, T3FREE, THYROIDAB in the last 72 hours.  Invalid input(s): FREET3  Cardiac Enzymes No results  for input(s): CKMB, TROPONINI, MYOGLOBIN in the last 168 hours.  Invalid input(s): CK ------------------------------------------------------------------------------------------------------------------    Component Value Date/Time   BNP 40.0 08/08/2020 0220    Micro Results Recent Results (from the past 240 hour(s))  Blood Culture (routine x 2)     Status: None   Collection Time: 07/31/20  5:20 AM   Specimen: BLOOD  Result Value Ref Range Status   Specimen Description BLOOD RIGHT ANTECUBITAL  Final   Special Requests   Final    BOTTLES DRAWN AEROBIC AND ANAEROBIC Blood Culture adequate volume   Culture   Final    NO GROWTH 5 DAYS Performed at [facility], [address] [place], [place] [address]    Report Status 08/05/2020 FINAL  Final  Blood Culture (routine x 2)     Status: None   Collection Time: 07/31/20  5:51 AM   Specimen: BLOOD  Result Value Ref Range Status   Specimen Description BLOOD LEFT HAND  Final   Special Requests   Final    BOTTLES DRAWN AEROBIC AND ANAEROBIC Blood Culture adequate volume   Culture   Final    NO GROWTH 5 DAYS Performed at [facility], [address], [place], [place] [address]    Report Status 08/05/2020 FINAL  Final  Resp Panel by RT-PCR (Flu A&B, Covid) Nasopharyngeal Swab     Status: Abnormal   Collection Time: 07/31/20  6:34 AM   Specimen: Nasopharyngeal Swab; Nasopharyngeal(NP) swabs in vial transport medium  Result Value Ref Range Status   SARS Coronavirus 2 by RT PCR POSITIVE (A) NEGATIVE Final    Comment: RESULT CALLED TO, READ BACK BY AND VERIFIED WITH: DOSS,M@0938  by MATTHEWS,B 1.5.22 (NOTE) SARS-CoV-2 target nucleic acids are DETECTED.  The SARS-CoV-2 RNA is generally detectable in upper respiratory specimens during the acute phase of infection. Positive results are indicative of the presence of the identified virus, but do not rule out bacterial infection or co-infection with other pathogens not detected by the test.  Clinical correlation with patient history and other diagnostic information is necessary to determine patient infection status. The expected result is Negative.  Fact Sheet for Patients: EntrepreneurPulse.com.au  Fact Sheet for Healthcare Providers: IncredibleEmployment.be  This test is not yet approved or cleared by the [place] FDA and  has been authorized for detection and/or diagnosis of SARS-CoV-2 by FDA under an Emergency Use Authorization (EUA).  This EUA will remain in effect (meaning this test can be  used) for the duration of  the COVID-19 declaration under Section 564(b)(1) of the Act, 21 U.S.C. section 360bbb-3(b)(1), unless the authorization is terminated or revoked sooner.     Influenza A by PCR NEGATIVE NEGATIVE Final   Influenza B by PCR NEGATIVE NEGATIVE Final    Comment: (NOTE) The Xpert Xpress SARS-CoV-2/FLU/RSV plus assay is intended as an aid in the diagnosis of influenza from Nasopharyngeal swab specimens and should not be used as a sole basis for treatment. Nasal washings and aspirates are unacceptable for Xpert Xpress SARS-CoV-2/FLU/RSV testing.  Fact Sheet for Patients: EntrepreneurPulse.com.au  Fact Sheet for Healthcare Providers: IncredibleEmployment.be  This test is not yet approved or cleared by the [place] FDA and has been authorized for detection and/or diagnosis of SARS-CoV-2 by FDA under an Emergency Use Authorization (EUA). This EUA will remain in effect (meaning this test can be used) for the duration of the COVID-19 declaration under Section 564(b)(1) of the Act, 21 U.S.C. section  360bbb-3(b)(1), unless the authorization is terminated or revoked.  Performed at [facility], [address], [place], [place] [address]   C Difficile Quick Screen w PCR reflex     Status: None   Collection Time: 08/04/20  5:49 PM   Specimen: STOOL  Result Value Ref Range Status    C Diff antigen NEGATIVE NEGATIVE Final   C Diff toxin NEGATIVE NEGATIVE Final   C Diff interpretation No C. difficile detected.  Final    Comment: Performed at [facility], [place] [address] [place], [place] [address]  MRSA PCR Screening     Status: None   Collection Time: 08/07/20  2:07 AM  Result Value Ref Range Status   MRSA by PCR NEGATIVE NEGATIVE Final    Comment:        The GeneXpert MRSA Assay (FDA approved for NASAL specimens only), is one component of a comprehensive MRSA colonization surveillance program. It is not intended to diagnose MRSA infection nor to guide or monitor treatment for MRSA infections. Performed at [facility], [place] [address] [place], [place] [address]     Radiology Reports CT ANGIO CHEST PE W OR WO CONTRAST  Result Date: 08/04/2020 CLINICAL DATA:  Coronavirus infection. Worsening respiratory status and oxygen requirement. Question emboli. EXAM: CT ANGIOGRAPHY CHEST WITH CONTRAST TECHNIQUE: Multidetector CT imaging of the chest was performed using the standard protocol during bolus administration of intravenous contrast. Multiplanar CT image reconstructions and MIPs were obtained to evaluate the vascular anatomy. CONTRAST:  56mL OMNIPAQUE IOHEXOL 350 MG/ML SOLN COMPARISON:  07/31/2020 FINDINGS: Cardiovascular: Heart size is normal. There is aortic atherosclerosis without aneurysm or dissection. Pulmonary arterial opacification is good. There are no pulmonary emboli. Mediastinum/Nodes: No mediastinal or hilar mass or lymphadenopathy. Lungs/Pleura: Bilateral hazy patchy pulmonary infiltrates have worsened in extent since the study 4 days ago. No dense consolidation, lobar collapse or pleural effusion. Upper Abdomen: Negative Musculoskeletal: Mastectomy and reconstruction on the right. Review of the MIP images confirms the above findings. IMPRESSION: 1. No pulmonary emboli. 2. Worsened, more extensive distribution of bilateral patchy pulmonary  infiltrates consistent with worsening viral pneumonia. No dense consolidation, lobar collapse or pleural effusion. 3. Aortic atherosclerosis. Aortic Atherosclerosis (ICD10-I70.0). Electronically Signed   By: Nelson Chimes M.D.   On: 08/04/2020 14:00   CT Angio Chest PE W/Cm &/Or Wo Cm  Result Date: 07/31/2020 CLINICAL DATA:  COVID positive. Breast cancer with prior plans to start chemotherapy today. EXAM: CT ANGIOGRAPHY CHEST WITH CONTRAST TECHNIQUE: Multidetector CT imaging of the chest was performed using the standard protocol during bolus administration of intravenous contrast. Multiplanar CT image reconstructions and MIPs were obtained to evaluate the vascular anatomy. CONTRAST:  156mL OMNIPAQUE IOHEXOL 350 MG/ML SOLN COMPARISON:  11/13/2015.  Outside head CT report 05/07/2020 FINDINGS: Cardiovascular: Cardiomegaly. No pericardial effusion. Aortic and coronary atherosclerotic calcifications. Satisfactory opacification of the pulmonary arteries to at least the segmental level with no convincing pulmonary filling defect when allowing for intermittent streak and motion artifact, motion especially affecting the upper lobes. Mediastinum/Nodes: Negative for adenopathy or mass in the mediastinum. Negative esophagus. There has been right axillary dissection with no axillary adenopathy. 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 Lungs/Pleura: Patchy ground-glass opacity in the bilateral lungs consistent with COVID pneumonia. There is a degree of bronchomalacia with intermittent airway narrowing. Per outside head CT 05/07/2020 there is pulmonary nodularity which is not assessed in this setting. Upper Abdomen: Negative Musculoskeletal: Trabecular coarsening in the medial right clavicle which is  stable from 2017. small sclerotic focus in the upper and posterior sternum, a sternal sclerotic foci was described on outside PET-CT. Review of the MIP images confirms the above findings. IMPRESSION: 1. COVID pattern  pneumonia. 2. No evidence of pulmonary embolism. 3. Cardiomegaly. Electronically Signed   By: Monte Fantasia M.D.   On: 07/31/2020 07:22   DG Chest Port 1 View  Result Date: 08/05/2020 CLINICAL DATA:  Shortness of breath and cough in a patient with COVID-19. EXAM: PORTABLE CHEST 1 VIEW COMPARISON:  CT chest and single view of the chest 08/04/2020 and 07/31/2020. FINDINGS: Left greater than right airspace disease persists appears worse than on the most recent comparison. No pneumothorax. Heart size is upper normal. Port-A-Cath is in place. IMPRESSION: Worsened bilateral airspace disease consistent with pneumonia. Electronically Signed   By: Inge Rise M.D.   On: 08/05/2020 09:01   DG Chest Port 1 View  Result Date: 08/04/2020 CLINICAL DATA:  Shortness of breath.  COVID positive. EXAM: PORTABLE CHEST 1 VIEW COMPARISON:  Radiographs and chest CTA 07/31/2020 FINDINGS: Left chest port remains in place. Bilateral COVID pneumonia with slight improvement in the upper lung zones from prior imaging. Mild unchanged elevation of right hemidiaphragm. No pneumomediastinum or pneumothorax. No significant pleural effusion. Surgical clips in the right axilla. IMPRESSION: Bilateral COVID pneumonia with slight improvement in the upper lung zones from exams 4 days ago. Electronically Signed   By: Keith Rake M.D.   On: 08/04/2020 01:10   DG Chest Port 1 View  Result Date: 07/31/2020 CLINICAL DATA:  COVID.  Hypoxia. EXAM: PORTABLE CHEST 1 VIEW COMPARISON:  09/13/2017. FINDINGS: Port-A-Cath noted with tip over right atrium. Heart size normal. Lung volumes. Diffuse bilateral interstitial infiltrates are noted. Findings consistent pneumonitis. No pleural effusion or pneumothorax. Degenerative changes scoliosis thoracic spine. Degenerative changes both shoulders. No acute bony abnormality. Surgical clips right chest. IMPRESSION: 1. Port-A-Cath noted with tip over right atrium. 2. Low lung volumes. Diffuse bilateral  interstitial infiltrates. Findings consistent with pneumonitis. Electronically Signed   By: Marcello Moores  Register   On: 07/31/2020 05:19   VAS [place] LOWER EXTREMITY VENOUS (DVT)  Result Date: 08/06/2020  Lower Venous DVT Study Other Indications: Covid+ "rapidly rising" d-dimer. Comparison Study: No prior venous study. Performing Technologist: Rogelia Rohrer  Examination Guidelines: A complete evaluation includes B-mode imaging, spectral Doppler, color Doppler, and power Doppler as needed of all accessible portions of each vessel. Bilateral testing is considered an integral part of a complete examination. Limited examinations for reoccurring indications may be performed as noted. The reflux portion of the exam is performed with the patient in reverse Trendelenburg.  +---------+---------------+---------+-----------+----------+--------------+ RIGHT    CompressibilityPhasicitySpontaneityPropertiesThrombus Aging +---------+---------------+---------+-----------+----------+--------------+ CFV      Full           Yes      Yes                                 +---------+---------------+---------+-----------+----------+--------------+ SFJ      Full                                                        +---------+---------------+---------+-----------+----------+--------------+ FV Prox  Full           Yes      Yes                                 +---------+---------------+---------+-----------+----------+--------------+  FV Mid   Full           Yes      Yes                                 +---------+---------------+---------+-----------+----------+--------------+ FV DistalFull           Yes      Yes                                 +---------+---------------+---------+-----------+----------+--------------+ PFV      Full                                                        +---------+---------------+---------+-----------+----------+--------------+ POP      Full           Yes      Yes                                  +---------+---------------+---------+-----------+----------+--------------+ PTV      Full                                                        +---------+---------------+---------+-----------+----------+--------------+ PERO     Full                                                        +---------+---------------+---------+-----------+----------+--------------+   +---------+---------------+---------+-----------+----------+--------------+ LEFT     CompressibilityPhasicitySpontaneityPropertiesThrombus Aging +---------+---------------+---------+-----------+----------+--------------+ CFV      Full           Yes      Yes                                 +---------+---------------+---------+-----------+----------+--------------+ SFJ      Full                                                        +---------+---------------+---------+-----------+----------+--------------+ FV Prox  Full           Yes      Yes                                 +---------+---------------+---------+-----------+----------+--------------+ FV Mid   Full           Yes      Yes                                 +---------+---------------+---------+-----------+----------+--------------+ FV DistalFull  Yes      Yes                                 +---------+---------------+---------+-----------+----------+--------------+ PFV      Full                                                        +---------+---------------+---------+-----------+----------+--------------+ POP      Full           Yes      Yes                                 +---------+---------------+---------+-----------+----------+--------------+ PTV      Full                                                        +---------+---------------+---------+-----------+----------+--------------+ PERO     Full                                                         +---------+---------------+---------+-----------+----------+--------------+     Summary: BILATERAL: - No evidence of deep vein thrombosis seen in the lower extremities, bilaterally. - RIGHT: - No cystic structure found in the popliteal fossa.  LEFT: - No cystic structure found in the popliteal fossa.  *See table(s) above for measurements and observations. Electronically signed by Deitra Mayo MD on 08/06/2020 at 6:26:48 PM.    Final

## 2020-08-08 NOTE — Plan of Care (Signed)
  Problem: Education: Goal: Knowledge of General Education information will improve Description: Including pain rating scale, medication(s)/side effects and non-pharmacologic comfort measures Outcome: Progressing   Problem: Health Behavior/Discharge Planning: Goal: Ability to manage health-related needs will improve Outcome: Progressing   Problem: Clinical Measurements: Goal: Ability to maintain clinical measurements within normal limits will improve Outcome: Progressing Goal: Will remain free from infection Outcome: Progressing Goal: Diagnostic test results will improve Outcome: Progressing Goal: Respiratory complications will improve Outcome: Progressing Goal: Cardiovascular complication will be avoided Outcome: Progressing   Problem: Activity: Goal: Risk for activity intolerance will decrease Outcome: Progressing   Problem: Nutrition: Goal: Adequate nutrition will be maintained Outcome: Progressing   Problem: Coping: Goal: Level of anxiety will decrease Outcome: Progressing   Problem: Education: Goal: Knowledge of risk factors and measures for prevention of condition will improve Outcome: Progressing   Problem: Coping: Goal: Psychosocial and spiritual needs will be supported Outcome: Progressing   Problem: Respiratory: Goal: Will maintain a patent airway Outcome: Progressing Goal: Complications related to the disease process, condition or treatment will be avoided or minimized Outcome: Progressing   

## 2020-08-09 DIAGNOSIS — U071 COVID-19: Secondary | ICD-10-CM | POA: Diagnosis not present

## 2020-08-09 DIAGNOSIS — J9601 Acute respiratory failure with hypoxia: Secondary | ICD-10-CM | POA: Diagnosis not present

## 2020-08-09 LAB — COMPREHENSIVE METABOLIC PANEL
ALT: 29 U/L (ref 0–44)
AST: 20 U/L (ref 15–41)
Albumin: 2.6 g/dL — ABNORMAL LOW (ref 3.5–5.0)
Alkaline Phosphatase: 64 U/L (ref 38–126)
Anion gap: 9 (ref 5–15)
BUN: 17 mg/dL (ref 8–23)
CO2: 27 mmol/L (ref 22–32)
Calcium: 8.5 mg/dL — ABNORMAL LOW (ref 8.9–10.3)
Chloride: 99 mmol/L (ref 98–111)
Creatinine, Ser: 0.68 mg/dL (ref 0.44–1.00)
GFR, Estimated: >60 mL/min (ref >60)
Glucose, Bld: 243 mg/dL — ABNORMAL HIGH (ref 70–99)
Potassium: 4.2 mmol/L (ref 3.5–5.1)
Sodium: 135 mmol/L (ref 135–145)
Total Bilirubin: 0.8 mg/dL (ref 0.3–1.2)
Total Protein: 6.1 g/dL — ABNORMAL LOW (ref 6.5–8.1)

## 2020-08-09 LAB — CBC WITH DIFFERENTIAL/PLATELET
Abs Immature Granulocytes: 0.15 10*3/uL — ABNORMAL HIGH (ref 0.00–0.07)
Basophils Absolute: 0 10*3/uL (ref 0.0–0.1)
Basophils Relative: 0 %
Eosinophils Absolute: 0 10*3/uL (ref 0.0–0.5)
Eosinophils Relative: 0 %
HCT: 34.4 % — ABNORMAL LOW (ref 36.0–46.0)
Hemoglobin: 11.1 g/dL — ABNORMAL LOW (ref 12.0–15.0)
Immature Granulocytes: 2 %
Lymphocytes Relative: 7 %
Lymphs Abs: 0.7 10*3/uL (ref 0.7–4.0)
MCH: 28.7 pg (ref 26.0–34.0)
MCHC: 32.3 g/dL (ref 30.0–36.0)
MCV: 88.9 fL (ref 80.0–100.0)
Monocytes Absolute: 0.6 10*3/uL (ref 0.1–1.0)
Monocytes Relative: 6 %
Neutro Abs: 7.9 10*3/uL — ABNORMAL HIGH (ref 1.7–7.7)
Neutrophils Relative %: 85 %
Platelets: 239 10*3/uL (ref 150–400)
RBC: 3.87 MIL/uL (ref 3.87–5.11)
RDW: 14.7 % (ref 11.5–15.5)
WBC: 9.3 10*3/uL (ref 4.0–10.5)
nRBC: 0 % (ref 0.0–0.2)

## 2020-08-09 LAB — BRAIN NATRIURETIC PEPTIDE: B Natriuretic Peptide: 30.8 pg/mL (ref 0.0–100.0)

## 2020-08-09 LAB — D-DIMER, QUANTITATIVE: D-Dimer, Quant: 2.21 ug/mL-FEU — ABNORMAL HIGH (ref 0.00–0.50)

## 2020-08-09 LAB — C-REACTIVE PROTEIN: CRP: 1.1 mg/dL — ABNORMAL HIGH (ref <1.0)

## 2020-08-09 LAB — PROCALCITONIN: Procalcitonin: <0.1 ng/mL

## 2020-08-09 MED ORDER — INSULIN ASPART 100 UNIT/ML ~~LOC~~ SOLN
0.0000 [IU] | Freq: Three times a day (TID) | SUBCUTANEOUS | Status: DC
  Administered 2020-08-11: 3 [IU] via SUBCUTANEOUS

## 2020-08-09 MED ORDER — PANTOPRAZOLE SODIUM 40 MG PO TBEC: 40.0000 mg | DELAYED_RELEASE_TABLET | Freq: Every day | ORAL | Status: DC

## 2020-08-09 NOTE — Plan of Care (Signed)
  Problem: Education: Goal: Knowledge of General Education information will improve Description: Including pain rating scale, medication(s)/side effects and non-pharmacologic comfort measures Outcome: Progressing   Problem: Health Behavior/Discharge Planning: Goal: Ability to manage health-related needs will improve Outcome: Progressing   Problem: Clinical Measurements: Goal: Ability to maintain clinical measurements within normal limits will improve Outcome: Progressing Goal: Will remain free from infection Outcome: Progressing Goal: Diagnostic test results will improve Outcome: Progressing Goal: Respiratory complications will improve Outcome: Progressing Goal: Cardiovascular complication will be avoided Outcome: Progressing   Problem: Activity: Goal: Risk for activity intolerance will decrease Outcome: Progressing   Problem: Nutrition: Goal: Adequate nutrition will be maintained Outcome: Progressing   Problem: Coping: Goal: Level of anxiety will decrease Outcome: Progressing   Problem: Education: Goal: Knowledge of risk factors and measures for prevention of condition will improve Outcome: Progressing   Problem: Coping: Goal: Psychosocial and spiritual needs will be supported Outcome: Progressing   Problem: Respiratory: Goal: Will maintain a patent airway Outcome: Progressing Goal: Complications related to the disease process, condition or treatment will be avoided or minimized Outcome: Progressing   

## 2020-08-09 NOTE — Progress Notes (Signed)
PROGRESS NOTE                                                                                                                                                                                                             Patient Demographics:    Claudia [name], is a 65 y.o. female, DOB - November 23, 1955, [id number]  Outpatient Primary MD for the patient is Pcp, No    LOS - 5  Admit date - 08/04/2020    Chief Complaint  Patient presents with  . Covid Positive       Brief Narrative (HPI from H&P)  Claudia [name] is a 65 y.o. female with medical history significant of hypertension, hyperlipidemia, stage IV breast cancer, anxiety, depression, GERD, and morbid obesity presents back to the hospital with worsening shortness of breath, he was recently admitted at [facility] for COVID-19 pneumonia and discharged home on a steroid taper.  She also had recent diagnosis of left arm cellulitis which has improved considerably.  In the ER she was diagnosed with acute hypoxic respiratory failure, CTA was negative and she was admitted for further treatment.   Subjective:   Patient in bed, appears comfortable, denies any headache, no fever, no chest pain or pressure, reports dyspnea, mainly with activity, reports cough has improved as well .   Assessment  & Plan :    Acute Hypoxic Resp. Failure due to Acute Covid 19 Viral Pneumonitis  - she fortunately not vaccinated, initially diagnosed with COVID-19 pneumonia on 07/31/2020, finished remdesivir outpatient, was on oral steroid taper but looks like she has failed.  Continue IV steroids, continue with baricitinib, CTA rules out PE.  Ongoing treatment she has shown good improvement, 6 L this morning, gust with staff, will try to ambulate in the room .  Encouraged the patient to sit up in chair in the daytime use I-S and flutter valve for pulmonary toiletry and then prone in bed when at night.   Will advance activity and titrate down oxygen as possible.  SpO2: 90 % O2 Flow Rate (L/min): 6 L/min FiO2 (%): 100 %  Recent Labs  Lab 08/04/20 0405 08/04/20 0413 08/05/20 0517 08/05/20 0800 08/06/20 0439 08/07/20 0122 08/08/20 0220 08/09/20 0200  WBC  --   --  6.0  --  6.0 10.4 11.8* 9.3  HGB  --    < >  11.4*  --  10.7* 11.3* 11.0* 11.1*  HCT  --    < > 37.3  --  33.4* 35.3* 33.4* 34.4*  PLT  --   --  185  --  203 255 235 239  CRP  --    < > 10.0*  --  9.3* 5.5* 2.0* 1.1*  BNP  --   --   --  36.4 58.9 51.0 40.0 30.8  DDIMER  --    < > 2.20*  --  2.11* 2.49* 2.15* 2.21*  PROCALCITON <0.10  --   --   --  <0.10 <0.10 <0.10 <0.10  AST  --   --  16  --  16 14* 16 20  ALT  --   --  24  --  23 22 21 29   ALKPHOS  --   --  57  --  64 67 61 64  BILITOT  --   --  0.8  --  0.7 0.9 0.7 0.8  ALBUMIN  --   --  2.7*  --  2.5* 2.8* 2.6* 2.6*   < > = values in this interval not displayed.    2.  Recent left arm cellulitis.  Resolved.  3.  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  Supportive care outpatient follow-up with PCP and oncology.  4.  Hypokalemia.  Replaced and stable.   5.  Anxiety and depression.  On Zoloft and as needed benzodiazepine.  6.  Morbid obesity.  BMI of 42.  Outpatient follow-up with PCP.  7.  Hypertension.  Labetalol as needed for now.  8.  Elevated D-dimer.  Likely due to inflammation, CTA negative, negative leg ultrasound.  Now on moderate dose Lovenox.       Condition - Extremely Guarded  Family Communication  :   Horris Latino [phone] on 08/05/2020, 08/08/2020  Code Status :  Full  Consults  :    Procedures  :    Leg [place] - no DVT  CTA - 1. No pulmonary emboli. 2. Worsened, more extensive distribution of bilateral patchy pulmonary infiltrates consistent with worsening viral pneumonia. No dense consolidation, lobar collapse or pleural effusion. 3. Aortic atherosclerosis. Aortic Atherosclerosis  PUD Prophylaxis :   Pepcid  Disposition Plan  :    Status is: Inpatient  Remains inpatient appropriate because:IV treatments appropriate due to intensity of illness or inability to take PO   Dispo: The patient is from: Home              Anticipated d/c is to: Home              Anticipated d/c date is: > 3 days              Patient currently is not medically stable to d/c.   DVT Prophylaxis  :  Lovenox    Lab Results  Component Value Date   PLT 239 08/09/2020    Diet :  Diet Order            Diet regular Room service appropriate? Yes; Fluid consistency: Thin  Diet effective now                  Inpatient Medications  Scheduled Meds: . albuterol  2 puff Inhalation Q6H  . anastrozole  1 mg Oral Daily  . vitamin C  500 mg Oral Daily  . baricitinib  4 mg Oral Daily  . Chlorhexidine Gluconate Cloth  6 each  Topical Daily  . enoxaparin (LOVENOX) injection  60 mg Subcutaneous Q12H  . ezetimibe  10 mg Oral Daily  . famotidine  20 mg Oral BID  . feeding supplement  237 mL Oral BID BM  . loratadine  10 mg Oral Daily  . methylPREDNISolone (SOLU-MEDROL) injection  60 mg Intravenous Q12H  . sertraline  250 mg Oral Daily  . sodium chloride flush  3 mL Intravenous Q12H  . zinc sulfate  220 mg Oral Daily   Continuous Infusions:  PRN Meds:.acetaminophen, alprazolam, alum & mag hydroxide-simeth, chlorpheniramine-HYDROcodone, gabapentin, guaiFENesin-dextromethorphan, HYDROcodone-acetaminophen, labetalol, loperamide, [DISCONTINUED] ondansetron **OR** ondansetron (ZOFRAN) IV  Antibiotics  :    Anti-infectives (From admission, onward)   Start     Dose/Rate Route Frequency Ordered Stop   08/05/20 1000  remdesivir 100 mg in sodium chloride 0.9 % 100 mL IVPB  Status:  Discontinued       "Followed by" Linked Group Details   100 mg 200 mL/hr over 30 Minutes Intravenous Daily 08/04/20 0740 08/04/20 0745   08/04/20 1000  remdesivir 100 mg in sodium chloride 0.9 % 100 mL IVPB        100 mg 200 mL/hr over  30 Minutes Intravenous Daily 08/04/20 0745 08/05/20 1215   08/04/20 0745  remdesivir 200 mg in sodium chloride 0.9% 250 mL IVPB  Status:  Discontinued       "Followed by" Linked Group Details   200 mg 580 mL/hr over 30 Minutes Intravenous Once 08/04/20 0740 08/04/20 0745        Claudia [name] M.D on 08/09/2020 at 2:11 PM  To page go to [email or web address]   [facility] -  Office  [phone]     See all Orders from today for further details    Objective:   Vitals:   08/09/20 0200 08/09/20 0448 08/09/20 0751 08/09/20 1305  BP:  (!) 147/76 (!) 151/78 128/69  Pulse:  74 70 83  Resp:  20 (!) 22 20  Temp:  98.3 F (36.8 C) 98.2 F (36.8 C) 98 F (36.7 C)  TempSrc:  Axillary Oral Oral  SpO2: 92% 90%    Weight:      Height:        Wt Readings from Last 3 Encounters:  08/06/20 112.8 kg  07/31/20 113.4 kg  07/23/20 113.4 kg     Intake/Output Summary (Last 24 hours) at 08/09/2020 1411 Last data filed at 08/09/2020 0953 Gross per 24 hour  Intake 480 ml  Output 1600 ml  Net -1120 ml     Physical Exam  Awake Alert, Oriented X 3, No new F.N deficits, Normal affect Symmetrical Chest wall movement, Good air movement bilaterally, CTAB RRR,No Gallops,Rubs or new Murmurs, No Parasternal Heave +ve B.Sounds, Abd Soft, No tenderness, No rebound - guarding or rigidity. No Cyanosis, Clubbing or edema, No new Rash or bruise       Data Review:    CBC Recent Labs  Lab 08/05/20 0517 08/06/20 0439 08/07/20 0122 08/08/20 0220 08/09/20 0200  WBC 6.0 6.0 10.4 11.8* 9.3  HGB 11.4* 10.7* 11.3* 11.0* 11.1*  HCT 37.3 33.4* 35.3* 33.4* 34.4*  PLT 185 203 255 235 239  MCV 90.8 89.1 87.4 88.1 88.9  MCH 27.7 28.5 28.0 29.0 28.7  MCHC 30.6 32.0 32.0 32.9 32.3  RDW 14.9 14.8 14.8 15.0 14.7  LYMPHSABS 1.5 0.5* 0.5* 0.7 0.7  MONOABS 0.3 0.2 0.5 0.4 0.6  EOSABS 0.0 0.0 0.0 0.0 0.0  BASOSABS 0.0 0.0 0.0 0.0 0.0  Recent Labs  Lab 08/04/20 0405 08/04/20 0413  08/04/20 0830 08/05/20 0517 08/05/20 0800 08/05/20 1740 08/06/20 0439 08/07/20 0122 08/08/20 0220 08/09/20 0200  NA  --    < >  --  141  --   --  138 136 136 135  K  --    < >  --  2.8*  --  3.9 4.2 4.2 4.3 4.2  CL  --   --   --  105  --   --  104 97* 97* 99  CO2  --   --   --  25  --   --  25 27 29 27   GLUCOSE  --   --   --  92  --   --  153* 155* 194* 243*  BUN  --   --   --  12  --   --  10 17 18 17   CREATININE  --   --   --  0.60  --   --  0.53 0.65 0.84 0.68  CALCIUM  --   --   --  8.4*  --   --  8.5* 9.0 8.6* 8.5*  AST  --   --   --  16  --   --  16 14* 16 20  ALT  --   --   --  24  --   --  23 22 21 29   ALKPHOS  --   --   --  57  --   --  64 67 61 64  BILITOT  --   --   --  0.8  --   --  0.7 0.9 0.7 0.8  ALBUMIN  --   --   --  2.7*  --   --  2.5* 2.8* 2.6* 2.6*  MG  --   --  1.9 2.1  --   --  2.1 2.1 2.2  --   CRP  --    < >  --  10.0*  --   --  9.3* 5.5* 2.0* 1.1*  DDIMER  --    < >  --  2.20*  --   --  2.11* 2.49* 2.15* 2.21*  PROCALCITON <0.10  --   --   --   --   --  <0.10 <0.10 <0.10 <0.10  BNP  --   --   --   --  36.4  --  58.9 51.0 40.0 30.8   < > = values in this interval not displayed.    ------------------------------------------------------------------------------------------------------------------ No results for input(s): CHOL, HDL, LDLCALC, TRIG, CHOLHDL, LDLDIRECT in the last 72 hours.  Lab Results  Component Value Date   HGBA1C 5.4 10/22/2012   ------------------------------------------------------------------------------------------------------------------ No results for input(s): TSH, T4TOTAL, T3FREE, THYROIDAB in the last 72 hours.  Invalid input(s): FREET3  Cardiac Enzymes No results for input(s): CKMB, TROPONINI, MYOGLOBIN in the last 168 hours.  Invalid input(s): CK ------------------------------------------------------------------------------------------------------------------    Component Value Date/Time   BNP 30.8 08/09/2020 0200     Micro Results Recent Results (from the past 240 hour(s))  Blood Culture (routine x 2)     Status: None   Collection Time: 07/31/20  5:20 AM   Specimen: BLOOD  Result Value Ref Range Status   Specimen Description BLOOD RIGHT ANTECUBITAL  Final   Special Requests   Final    BOTTLES DRAWN AEROBIC AND ANAEROBIC Blood Culture adequate volume   Culture   Final    NO GROWTH 5  DAYS Performed at [facility], [address] [place], [place] [address]    Report Status 08/05/2020 FINAL  Final  Blood Culture (routine x 2)     Status: None   Collection Time: 07/31/20  5:51 AM   Specimen: BLOOD  Result Value Ref Range Status   Specimen Description BLOOD LEFT HAND  Final   Special Requests   Final    BOTTLES DRAWN AEROBIC AND ANAEROBIC Blood Culture adequate volume   Culture   Final    NO GROWTH 5 DAYS Performed at [facility], [address], [place], [place] [address]    Report Status 08/05/2020 FINAL  Final  Resp Panel by RT-PCR (Flu A&B, Covid) Nasopharyngeal Swab     Status: Abnormal   Collection Time: 07/31/20  6:34 AM   Specimen: Nasopharyngeal Swab; Nasopharyngeal(NP) swabs in vial transport medium  Result Value Ref Range Status   SARS Coronavirus 2 by RT PCR POSITIVE (A) NEGATIVE Final    Comment: RESULT CALLED TO, READ BACK BY AND VERIFIED WITH: DOSS,M@0938  by MATTHEWS,B 1.5.22 (NOTE) SARS-CoV-2 target nucleic acids are DETECTED.  The SARS-CoV-2 RNA is generally detectable in upper respiratory specimens during the acute phase of infection. Positive results are indicative of the presence of the identified virus, but do not rule out bacterial infection or co-infection with other pathogens not detected by the test. Clinical correlation with patient history and other diagnostic information is necessary to determine patient infection status. The expected result is Negative.  Fact Sheet for Patients: EntrepreneurPulse.com.au  Fact Sheet for Healthcare  Providers: IncredibleEmployment.be  This test is not yet approved or cleared by the [place] FDA and  has been authorized for detection and/or diagnosis of SARS-CoV-2 by FDA under an Emergency Use Authorization (EUA).  This EUA will remain in effect (meaning this test can be  used) for the duration of  the COVID-19 declaration under Section 564(b)(1) of the Act, 21 U.S.C. section 360bbb-3(b)(1), unless the authorization is terminated or revoked sooner.     Influenza A by PCR NEGATIVE NEGATIVE Final   Influenza B by PCR NEGATIVE NEGATIVE Final    Comment: (NOTE) The Xpert Xpress SARS-CoV-2/FLU/RSV plus assay is intended as an aid in the diagnosis of influenza from Nasopharyngeal swab specimens and should not be used as a sole basis for treatment. Nasal washings and aspirates are unacceptable for Xpert Xpress SARS-CoV-2/FLU/RSV testing.  Fact Sheet for Patients: EntrepreneurPulse.com.au  Fact Sheet for Healthcare Providers: IncredibleEmployment.be  This test is not yet approved or cleared by the [place] FDA and has been authorized for detection and/or diagnosis of SARS-CoV-2 by FDA under an Emergency Use Authorization (EUA). This EUA will remain in effect (meaning this test can be used) for the duration of the COVID-19 declaration under Section 564(b)(1) of the Act, 21 U.S.C. section 360bbb-3(b)(1), unless the authorization is terminated or revoked.  Performed at [facility], [address], [place], [place] [address]   C Difficile Quick Screen w PCR reflex     Status: None   Collection Time: 08/04/20  5:49 PM   Specimen: STOOL  Result Value Ref Range Status   C Diff antigen NEGATIVE NEGATIVE Final   C Diff toxin NEGATIVE NEGATIVE Final   C Diff interpretation No C. difficile detected.  Final    Comment: Performed at [facility], [place] [address], [place],  [address]  MRSA PCR Screening     Status:  None   Collection Time: 08/07/20  2:07 AM  Result  Value Ref Range Status   MRSA by PCR NEGATIVE NEGATIVE Final    Comment:        The GeneXpert MRSA Assay (FDA approved for NASAL specimens only), is one component of a comprehensive MRSA colonization surveillance program. It is not intended to diagnose MRSA infection nor to guide or monitor treatment for MRSA infections. Performed at [facility], [place] [address], [place], [place] [address]     Radiology Reports CT ANGIO CHEST PE W OR WO CONTRAST  Result Date: 08/04/2020 CLINICAL DATA:  Coronavirus infection. Worsening respiratory status and oxygen requirement. Question emboli. EXAM: CT ANGIOGRAPHY CHEST WITH CONTRAST TECHNIQUE: Multidetector CT imaging of the chest was performed using the standard protocol during bolus administration of intravenous contrast. Multiplanar CT image reconstructions and MIPs were obtained to evaluate the vascular anatomy. CONTRAST:  28mL OMNIPAQUE IOHEXOL 350 MG/ML SOLN COMPARISON:  07/31/2020 FINDINGS: Cardiovascular: Heart size is normal. There is aortic atherosclerosis without aneurysm or dissection. Pulmonary arterial opacification is good. There are no pulmonary emboli. Mediastinum/Nodes: No mediastinal or hilar mass or lymphadenopathy. Lungs/Pleura: Bilateral hazy patchy pulmonary infiltrates have worsened in extent since the study 4 days ago. No dense consolidation, lobar collapse or pleural effusion. Upper Abdomen: Negative Musculoskeletal: Mastectomy and reconstruction on the right. Review of the MIP images confirms the above findings. IMPRESSION: 1. No pulmonary emboli. 2. Worsened, more extensive distribution of bilateral patchy pulmonary infiltrates consistent with worsening viral pneumonia. No dense consolidation, lobar collapse or pleural effusion. 3. Aortic atherosclerosis. Aortic Atherosclerosis (ICD10-I70.0). Electronically Signed   By: Nelson Chimes M.D.   On: 08/04/2020 14:00   CT Angio Chest  PE W/Cm &/Or Wo Cm  Result Date: 07/31/2020 CLINICAL DATA:  COVID positive. Breast cancer with prior plans to start chemotherapy today. EXAM: CT ANGIOGRAPHY CHEST WITH CONTRAST TECHNIQUE: Multidetector CT imaging of the chest was performed using the standard protocol during bolus administration of intravenous contrast. Multiplanar CT image reconstructions and MIPs were obtained to evaluate the vascular anatomy. CONTRAST:  114mL OMNIPAQUE IOHEXOL 350 MG/ML SOLN COMPARISON:  11/13/2015.  Outside head CT report 05/07/2020 FINDINGS: Cardiovascular: Cardiomegaly. No pericardial effusion. Aortic and coronary atherosclerotic calcifications. Satisfactory opacification of the pulmonary arteries to at least the segmental level with no convincing pulmonary filling defect when allowing for intermittent streak and motion artifact, motion especially affecting the upper lobes. Mediastinum/Nodes: Negative for adenopathy or mass in the mediastinum. Negative esophagus. There has been right axillary dissection with no axillary adenopathy. 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 Lungs/Pleura: Patchy ground-glass opacity in the bilateral lungs consistent with COVID pneumonia. There is a degree of bronchomalacia with intermittent airway narrowing. Per outside head CT 05/07/2020 there is pulmonary nodularity which is not assessed in this setting. Upper Abdomen: Negative Musculoskeletal: Trabecular coarsening in the medial right clavicle which is stable from 2017. small sclerotic focus in the upper and posterior sternum, a sternal sclerotic foci was described on outside PET-CT. Review of the MIP images confirms the above findings. IMPRESSION: 1. COVID pattern pneumonia. 2. No evidence of pulmonary embolism. 3. Cardiomegaly. Electronically Signed   By: Monte Fantasia M.D.   On: 07/31/2020 07:22   DG Chest Port 1 View  Result Date: 08/05/2020 CLINICAL DATA:  Shortness of breath and cough in a patient with COVID-19.  EXAM: PORTABLE CHEST 1 VIEW COMPARISON:  CT chest and single view of the chest 08/04/2020 and 07/31/2020. FINDINGS: Left greater than right airspace disease persists appears worse than on the most recent comparison. No pneumothorax.  Heart size is upper normal. Port-A-Cath is in place. IMPRESSION: Worsened bilateral airspace disease consistent with pneumonia. Electronically Signed   By: Inge Rise M.D.   On: 08/05/2020 09:01   DG Chest Port 1 View  Result Date: 08/04/2020 CLINICAL DATA:  Shortness of breath.  COVID positive. EXAM: PORTABLE CHEST 1 VIEW COMPARISON:  Radiographs and chest CTA 07/31/2020 FINDINGS: Left chest port remains in place. Bilateral COVID pneumonia with slight improvement in the upper lung zones from prior imaging. Mild unchanged elevation of right hemidiaphragm. No pneumomediastinum or pneumothorax. No significant pleural effusion. Surgical clips in the right axilla. IMPRESSION: Bilateral COVID pneumonia with slight improvement in the upper lung zones from exams 4 days ago. Electronically Signed   By: Keith Rake M.D.   On: 08/04/2020 01:10   DG Chest Port 1 View  Result Date: 07/31/2020 CLINICAL DATA:  COVID.  Hypoxia. EXAM: PORTABLE CHEST 1 VIEW COMPARISON:  09/13/2017. FINDINGS: Port-A-Cath noted with tip over right atrium. Heart size normal. Lung volumes. Diffuse bilateral interstitial infiltrates are noted. Findings consistent pneumonitis. No pleural effusion or pneumothorax. Degenerative changes scoliosis thoracic spine. Degenerative changes both shoulders. No acute bony abnormality. Surgical clips right chest. IMPRESSION: 1. Port-A-Cath noted with tip over right atrium. 2. Low lung volumes. Diffuse bilateral interstitial infiltrates. Findings consistent with pneumonitis. Electronically Signed   By: Marcello Moores  Register   On: 07/31/2020 05:19   VAS [place] LOWER EXTREMITY VENOUS (DVT)  Result Date: 08/06/2020  Lower Venous DVT Study Other Indications: Covid+ "rapidly rising"  d-dimer. Comparison Study: No prior venous study. Performing Technologist: Rogelia Rohrer  Examination Guidelines: A complete evaluation includes B-mode imaging, spectral Doppler, color Doppler, and power Doppler as needed of all accessible portions of each vessel. Bilateral testing is considered an integral part of a complete examination. Limited examinations for reoccurring indications may be performed as noted. The reflux portion of the exam is performed with the patient in reverse Trendelenburg.  +---------+---------------+---------+-----------+----------+--------------+ RIGHT    CompressibilityPhasicitySpontaneityPropertiesThrombus Aging +---------+---------------+---------+-----------+----------+--------------+ CFV      Full           Yes      Yes                                 +---------+---------------+---------+-----------+----------+--------------+ SFJ      Full                                                        +---------+---------------+---------+-----------+----------+--------------+ FV Prox  Full           Yes      Yes                                 +---------+---------------+---------+-----------+----------+--------------+ FV Mid   Full           Yes      Yes                                 +---------+---------------+---------+-----------+----------+--------------+ FV DistalFull           Yes      Yes                                 +---------+---------------+---------+-----------+----------+--------------+  PFV      Full                                                        +---------+---------------+---------+-----------+----------+--------------+ POP      Full           Yes      Yes                                 +---------+---------------+---------+-----------+----------+--------------+ PTV      Full                                                        +---------+---------------+---------+-----------+----------+--------------+  PERO     Full                                                        +---------+---------------+---------+-----------+----------+--------------+   +---------+---------------+---------+-----------+----------+--------------+ LEFT     CompressibilityPhasicitySpontaneityPropertiesThrombus Aging +---------+---------------+---------+-----------+----------+--------------+ CFV      Full           Yes      Yes                                 +---------+---------------+---------+-----------+----------+--------------+ SFJ      Full                                                        +---------+---------------+---------+-----------+----------+--------------+ FV Prox  Full           Yes      Yes                                 +---------+---------------+---------+-----------+----------+--------------+ FV Mid   Full           Yes      Yes                                 +---------+---------------+---------+-----------+----------+--------------+ FV DistalFull           Yes      Yes                                 +---------+---------------+---------+-----------+----------+--------------+ PFV      Full                                                        +---------+---------------+---------+-----------+----------+--------------+  POP      Full           Yes      Yes                                 +---------+---------------+---------+-----------+----------+--------------+ PTV      Full                                                        +---------+---------------+---------+-----------+----------+--------------+ PERO     Full                                                        +---------+---------------+---------+-----------+----------+--------------+     Summary: BILATERAL: - No evidence of deep vein thrombosis seen in the lower extremities, bilaterally. - RIGHT: - No cystic structure found in the popliteal fossa.  LEFT: - No cystic structure  found in the popliteal fossa.  *See table(s) above for measurements and observations. Electronically signed by Deitra Mayo MD on 08/06/2020 at 6:26:48 PM.    Final

## 2020-08-10 DIAGNOSIS — J9601 Acute respiratory failure with hypoxia: Secondary | ICD-10-CM | POA: Diagnosis not present

## 2020-08-10 DIAGNOSIS — U071 COVID-19: Secondary | ICD-10-CM | POA: Diagnosis not present

## 2020-08-10 DIAGNOSIS — J1282 Pneumonia due to coronavirus disease 2019: Secondary | ICD-10-CM | POA: Diagnosis not present

## 2020-08-10 LAB — COMPREHENSIVE METABOLIC PANEL
ALT: 32 U/L (ref 0–44)
AST: 22 U/L (ref 15–41)
Albumin: 2.8 g/dL — ABNORMAL LOW (ref 3.5–5.0)
Alkaline Phosphatase: 64 U/L (ref 38–126)
Anion gap: 10 (ref 5–15)
BUN: 17 mg/dL (ref 8–23)
CO2: 26 mmol/L (ref 22–32)
Calcium: 8.7 mg/dL — ABNORMAL LOW (ref 8.9–10.3)
Chloride: 102 mmol/L (ref 98–111)
Creatinine, Ser: 0.71 mg/dL (ref 0.44–1.00)
GFR, Estimated: >60 mL/min (ref >60)
Glucose, Bld: 111 mg/dL — ABNORMAL HIGH (ref 70–99)
Potassium: 5.1 mmol/L (ref 3.5–5.1)
Sodium: 138 mmol/L (ref 135–145)
Total Bilirubin: 0.6 mg/dL (ref 0.3–1.2)
Total Protein: 6.3 g/dL — ABNORMAL LOW (ref 6.5–8.1)

## 2020-08-10 LAB — HEMOGLOBIN A1C
Hgb A1c MFr Bld: 5.6 % (ref 4.8–5.6)
Mean Plasma Glucose: 114.02 mg/dL

## 2020-08-10 LAB — GLUCOSE, CAPILLARY
Glucose-Capillary: 83 mg/dL (ref 70–99)
Glucose-Capillary: 177 mg/dL — ABNORMAL HIGH (ref 70–99)
Glucose-Capillary: 208 mg/dL — ABNORMAL HIGH (ref 70–99)

## 2020-08-10 LAB — CBC
HCT: 38.7 % (ref 36.0–46.0)
Hemoglobin: 11.7 g/dL — ABNORMAL LOW (ref 12.0–15.0)
MCH: 27.4 pg (ref 26.0–34.0)
MCHC: 30.2 g/dL (ref 30.0–36.0)
MCV: 90.6 fL (ref 80.0–100.0)
Platelets: 250 10*3/uL (ref 150–400)
RBC: 4.27 MIL/uL (ref 3.87–5.11)
RDW: 15 % (ref 11.5–15.5)
WBC: 12.2 10*3/uL — ABNORMAL HIGH (ref 4.0–10.5)
nRBC: 0 % (ref 0.0–0.2)

## 2020-08-10 LAB — D-DIMER, QUANTITATIVE: D-Dimer, Quant: 2.21 ug/mL-FEU — ABNORMAL HIGH (ref 0.00–0.50)

## 2020-08-10 MED ORDER — SODIUM ZIRCONIUM CYCLOSILICATE 5 G PO PACK
5.0000 g | PACK | Freq: Once | ORAL | Status: AC
  Administered 2020-08-10: 5 g via ORAL
  Filled 2020-08-10 (×2): qty 1

## 2020-08-10 NOTE — Plan of Care (Signed)
  Problem: Education: Goal: Knowledge of General Education information will improve Description: Including pain rating scale, medication(s)/side effects and non-pharmacologic comfort measures Outcome: Progressing   Problem: Health Behavior/Discharge Planning: Goal: Ability to manage health-related needs will improve Outcome: Progressing   Problem: Clinical Measurements: Goal: Ability to maintain clinical measurements within normal limits will improve Outcome: Progressing Goal: Will remain free from infection Outcome: Progressing Goal: Diagnostic test results will improve Outcome: Progressing Goal: Respiratory complications will improve Outcome: Progressing Goal: Cardiovascular complication will be avoided Outcome: Progressing   Problem: Activity: Goal: Risk for activity intolerance will decrease Outcome: Progressing   Problem: Nutrition: Goal: Adequate nutrition will be maintained Outcome: Progressing   Problem: Coping: Goal: Level of anxiety will decrease Outcome: Progressing   Problem: Education: Goal: Knowledge of risk factors and measures for prevention of condition will improve Outcome: Progressing   Problem: Coping: Goal: Psychosocial and spiritual needs will be supported Outcome: Progressing   Problem: Respiratory: Goal: Will maintain a patent airway Outcome: Progressing Goal: Complications related to the disease process, condition or treatment will be avoided or minimized Outcome: Progressing

## 2020-08-10 NOTE — Progress Notes (Signed)
PROGRESS NOTE                                                                                                                                                                                                             Patient Demographics:    Claudia [name], is a 65 y.o. female, DOB - [birth year], [id number]  Outpatient Primary MD for the patient is Pcp, No    LOS - 6  Admit date - 08/04/2020    Chief Complaint  Patient presents with  . Covid Positive       Brief Narrative (HPI from H&P)  Claudia [name] is a 65 y.o. female with medical history significant of hypertension, hyperlipidemia, stage IV breast cancer, anxiety, depression, GERD, and morbid obesity presents back to the hospital with worsening shortness of breath, he was recently admitted at [facility] for COVID-19 pneumonia and discharged home on a steroid taper.  She also had recent diagnosis of left arm cellulitis which has improved considerably.  In the ER she was diagnosed with acute hypoxic respiratory failure, CTA was negative and she was admitted for further treatment.   Subjective:   Patient in bed, appears comfortable, denies any headache, no fever, no chest pain or pressure, reports dyspnea, mainly with activity, reports cough has improved as well .   Assessment  & Plan :   1.  Acute Hypoxic Resp. Failure due to Acute Covid 19 Viral Pneumonitis  - she fortunately not vaccinated, initially diagnosed with COVID-19 pneumonia on 07/31/2020, finished remdesivir outpatient, was on oral steroid taper but looks like she has failed.  Continue with IV steroids, continue with her baricitinib, oxygen requirement appears to be improving, this morning she is on 3 L nasal cannula , - CTA rules out PE.    Encouraged the patient to sit up in chair in the daytime use I-S and flutter valve for pulmonary toiletry and then prone in bed when at night.  Will advance  activity and titrate down oxygen as possible.  SpO2: 90 % O2 Flow Rate (L/min): 2.5 L/min FiO2 (%): 100 %  Recent Labs  Lab 08/04/20 0405 08/04/20 0413 08/05/20 0517 08/05/20 0800 08/06/20 0439 08/07/20 0122 08/08/20 0220 08/09/20 0200 08/10/20 0623  WBC  --   --  6.0  --  6.0 10.4 11.8* 9.3 12.2*  HGB  --    < >  11.4*  --  10.7* 11.3* 11.0* 11.1* 11.7*  HCT  --    < > 37.3  --  33.4* 35.3* 33.4* 34.4* 38.7  PLT  --   --  185  --  203 255 235 239 250  CRP  --    < > 10.0*  --  9.3* 5.5* 2.0* 1.1*  --   BNP  --   --   --  36.4 58.9 51.0 40.0 30.8  --   DDIMER  --    < > 2.20*  --  2.11* 2.49* 2.15* 2.21* 2.21*  PROCALCITON <0.10  --   --   --  <0.10 <0.10 <0.10 <0.10  --   AST  --   --  16  --  16 14* 16 20 22   ALT  --   --  24  --  23 22 21 29  32  ALKPHOS  --   --  57  --  64 67 61 64 64  BILITOT  --   --  0.8  --  0.7 0.9 0.7 0.8 0.6  ALBUMIN  --   --  2.7*  --  2.5* 2.8* 2.6* 2.6* 2.8*   < > = values in this interval not displayed.    2.  Recent left arm cellulitis.  Resolved.  3.  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  Supportive care outpatient follow-up with PCP and oncology.  4.  Hypokalemia.  Replaced and stable.   5.  Anxiety and depression.  On Zoloft and as needed benzodiazepine.  6.  Morbid obesity.  BMI of 42.  Outpatient follow-up with PCP.  7.  Hypertension.  Labetalol as needed for now.  8.  Elevated D-dimer.  Likely due to inflammation, CTA negative, negative leg ultrasound.  Now on moderate dose Lovenox.   Potassium is 5.1, will give 1 dose of Lokelma      Condition - Extremely Guarded  Family Communication  :   Discussed with patient in detail, she is updated and answered her questions  Code Status :  Full  Consults  :    Procedures  :    Leg [place] - no DVT  CTA - 1. No pulmonary emboli. 2. Worsened, more extensive distribution of bilateral patchy pulmonary infiltrates consistent with worsening viral  pneumonia. No dense consolidation, lobar collapse or pleural effusion. 3. Aortic atherosclerosis. Aortic Atherosclerosis  PUD Prophylaxis :  Pepcid  Disposition Plan  :    Status is: Inpatient  Remains inpatient appropriate because:IV treatments appropriate due to intensity of illness or inability to take PO   Dispo: The patient is from: Home              Anticipated d/c is to: Home              Anticipated d/c date is: 1 day              Patient currently is not medically stable to d/c.   DVT Prophylaxis  :  Lovenox    Lab Results  Component Value Date   PLT 250 08/10/2020    Diet :  Diet Order            Diet regular Room service appropriate? Yes; Fluid consistency: Thin  Diet effective now                  Inpatient Medications  Scheduled Meds: . albuterol  2 puff Inhalation Q6H  .  anastrozole  1 mg Oral Daily  . vitamin C  500 mg Oral Daily  . baricitinib  4 mg Oral Daily  . Chlorhexidine Gluconate Cloth  6 each Topical Daily  . enoxaparin (LOVENOX) injection  60 mg Subcutaneous Q12H  . ezetimibe  10 mg Oral Daily  . famotidine  20 mg Oral BID  . feeding supplement  237 mL Oral BID BM  . insulin aspart  0-15 Units Subcutaneous TID WC  . loratadine  10 mg Oral Daily  . methylPREDNISolone (SOLU-MEDROL) injection  60 mg Intravenous Q12H  . sertraline  250 mg Oral Daily  . sodium chloride flush  3 mL Intravenous Q12H  . sodium zirconium cyclosilicate  5 g Oral Once  . zinc sulfate  220 mg Oral Daily   Continuous Infusions:  PRN Meds:.acetaminophen, alprazolam, alum & mag hydroxide-simeth, chlorpheniramine-HYDROcodone, gabapentin, guaiFENesin-dextromethorphan, HYDROcodone-acetaminophen, labetalol, loperamide, [DISCONTINUED] ondansetron **OR** ondansetron (ZOFRAN) IV  Antibiotics  :    Anti-infectives (From admission, onward)   Start     Dose/Rate Route Frequency Ordered Stop   08/05/20 1000  remdesivir 100 mg in sodium chloride 0.9 % 100 mL IVPB  Status:   Discontinued       "Followed by" Linked Group Details   100 mg 200 mL/hr over 30 Minutes Intravenous Daily 08/04/20 0740 08/04/20 0745   08/04/20 1000  remdesivir 100 mg in sodium chloride 0.9 % 100 mL IVPB        100 mg 200 mL/hr over 30 Minutes Intravenous Daily 08/04/20 0745 08/05/20 1215   08/04/20 0745  remdesivir 200 mg in sodium chloride 0.9% 250 mL IVPB  Status:  Discontinued       "Followed by" Linked Group Details   200 mg 580 mL/hr over 30 Minutes Intravenous Once 08/04/20 0740 08/04/20 0745        Deklyn Gibbon M.D on 08/10/2020 at 12:59 PM  To page go to [email or web address]   [facility] -  Office  [phone]     See all Orders from today for further details    Objective:   Vitals:   08/10/20 0000 08/10/20 0400 08/10/20 0815 08/10/20 1203  BP: 134/77 139/79 (!) 149/89 131/70  Pulse: 79 78 68 79  Resp: (!) 22 (!) 25 17 20   Temp: 98 F (36.7 C) 97.6 F (36.4 C) 97.9 F (36.6 C) (!) 97.4 F (36.3 C)  TempSrc: Oral Axillary Oral Oral  SpO2: 93% 93% 92% 90%  Weight:  115 kg    Height:        Wt Readings from Last 3 Encounters:  08/10/20 115 kg  07/31/20 113.4 kg  07/23/20 113.4 kg     Intake/Output Summary (Last 24 hours) at 08/10/2020 1259 Last data filed at 08/10/2020 1119 Gross per 24 hour  Intake 370 ml  Output -  Net 370 ml     Physical Exam  Awake Alert, Oriented X 3, No new F.N deficits, Normal affect Symmetrical Chest wall movement, Good air movement bilaterally, CTAB RRR,No Gallops,Rubs or new Murmurs, No Parasternal Heave +ve B.Sounds, Abd Soft, No tenderness, No rebound - guarding or rigidity. No Cyanosis, Clubbing or edema, No new Rash or bruise       Data Review:    CBC Recent Labs  Lab 08/05/20 0517 08/06/20 0439 08/07/20 0122 08/08/20 0220 08/09/20 0200 08/10/20 0623  WBC 6.0 6.0 10.4 11.8* 9.3 12.2*  HGB 11.4* 10.7* 11.3* 11.0* 11.1* 11.7*  HCT 37.3 33.4* 35.3* 33.4* 34.4* 38.7  PLT 185 203  255 235 239  250  MCV 90.8 89.1 87.4 88.1 88.9 90.6  MCH 27.7 28.5 28.0 29.0 28.7 27.4  MCHC 30.6 32.0 32.0 32.9 32.3 30.2  RDW 14.9 14.8 14.8 15.0 14.7 15.0  LYMPHSABS 1.5 0.5* 0.5* 0.7 0.7  --   MONOABS 0.3 0.2 0.5 0.4 0.6  --   EOSABS 0.0 0.0 0.0 0.0 0.0  --   BASOSABS 0.0 0.0 0.0 0.0 0.0  --     Recent Labs  Lab 08/04/20 0405 08/04/20 0413 08/04/20 0830 08/05/20 0517 08/05/20 0800 08/05/20 1740 08/06/20 0439 08/07/20 0122 08/08/20 0220 08/09/20 0200 08/10/20 0623  NA  --    < >  --  141  --   --  138 136 136 135 138  K  --    < >  --  2.8*  --    < > 4.2 4.2 4.3 4.2 5.1  CL  --   --   --  105  --   --  104 97* 97* 99 102  CO2  --   --   --  25  --   --  25 27 29 27 26   GLUCOSE  --   --   --  92  --   --  153* 155* 194* 243* 111*  BUN  --   --   --  12  --   --  10 17 18 17 17   CREATININE  --   --   --  0.60  --   --  0.53 0.65 0.84 0.68 0.71  CALCIUM  --   --   --  8.4*  --   --  8.5* 9.0 8.6* 8.5* 8.7*  AST  --   --   --  16  --   --  16 14* 16 20 22   ALT  --   --   --  24  --   --  23 22 21 29  32  ALKPHOS  --   --   --  57  --   --  64 67 61 64 64  BILITOT  --   --   --  0.8  --   --  0.7 0.9 0.7 0.8 0.6  ALBUMIN  --   --   --  2.7*  --   --  2.5* 2.8* 2.6* 2.6* 2.8*  MG  --   --  1.9 2.1  --   --  2.1 2.1 2.2  --   --   CRP  --    < >  --  10.0*  --   --  9.3* 5.5* 2.0* 1.1*  --   DDIMER  --    < >  --  2.20*  --   --  2.11* 2.49* 2.15* 2.21* 2.21*  PROCALCITON <0.10  --   --   --   --   --  <0.10 <0.10 <0.10 <0.10  --   HGBA1C  --   --   --   --   --   --   --   --   --   --  5.6  BNP  --   --   --   --  36.4  --  58.9 51.0 40.0 30.8  --    < > = values in this interval not displayed.    ------------------------------------------------------------------------------------------------------------------ No results for input(s): CHOL, HDL, LDLCALC, TRIG, CHOLHDL, LDLDIRECT in the last 72 hours.  Lab Results  Component Value Date   HGBA1C 5.6 08/10/2020    ------------------------------------------------------------------------------------------------------------------ No results for input(s): TSH, T4TOTAL, T3FREE, THYROIDAB in the last 72 hours.  Invalid input(s): FREET3  Cardiac Enzymes No results for input(s): CKMB, TROPONINI, MYOGLOBIN in the last 168 hours.  Invalid input(s): CK ------------------------------------------------------------------------------------------------------------------    Component Value Date/Time   BNP 30.8 08/09/2020 0200    Micro Results Recent Results (from the past 240 hour(s))  C Difficile Quick Screen w PCR reflex     Status: None   Collection Time: 08/04/20  5:49 PM   Specimen: STOOL  Result Value Ref Range Status   C Diff antigen NEGATIVE NEGATIVE Final   C Diff toxin NEGATIVE NEGATIVE Final   C Diff interpretation No C. difficile detected.  Final    Comment: Performed at [facility], [place] [address], [place], [place] [address]  MRSA PCR Screening     Status: None   Collection Time: 08/07/20  2:07 AM  Result Value Ref Range Status   MRSA by PCR NEGATIVE NEGATIVE Final    Comment:        The GeneXpert MRSA Assay (FDA approved for NASAL specimens only), is one component of a comprehensive MRSA colonization surveillance program. It is not intended to diagnose MRSA infection nor to guide or monitor treatment for MRSA infections. Performed at [facility], [place] [address], [place], [place] [address]     Radiology Reports CT ANGIO CHEST PE W OR WO CONTRAST  Result Date: 08/04/2020 CLINICAL DATA:  Coronavirus infection. Worsening respiratory status and oxygen requirement. Question emboli. EXAM: CT ANGIOGRAPHY CHEST WITH CONTRAST TECHNIQUE: Multidetector CT imaging of the chest was performed using the standard protocol during bolus administration of intravenous contrast. Multiplanar CT image reconstructions and MIPs were obtained to evaluate the vascular anatomy. CONTRAST:  28mL  OMNIPAQUE IOHEXOL 350 MG/ML SOLN COMPARISON:  07/31/2020 FINDINGS: Cardiovascular: Heart size is normal. There is aortic atherosclerosis without aneurysm or dissection. Pulmonary arterial opacification is good. There are no pulmonary emboli. Mediastinum/Nodes: No mediastinal or hilar mass or lymphadenopathy. Lungs/Pleura: Bilateral hazy patchy pulmonary infiltrates have worsened in extent since the study 4 days ago. No dense consolidation, lobar collapse or pleural effusion. Upper Abdomen: Negative Musculoskeletal: Mastectomy and reconstruction on the right. Review of the MIP images confirms the above findings. IMPRESSION: 1. No pulmonary emboli. 2. Worsened, more extensive distribution of bilateral patchy pulmonary infiltrates consistent with worsening viral pneumonia. No dense consolidation, lobar collapse or pleural effusion. 3. Aortic atherosclerosis. Aortic Atherosclerosis (ICD10-I70.0). Electronically Signed   By: Nelson Chimes M.D.   On: 08/04/2020 14:00   CT Angio Chest PE W/Cm &/Or Wo Cm  Result Date: 07/31/2020 CLINICAL DATA:  COVID positive. Breast cancer with prior plans to start chemotherapy today. EXAM: CT ANGIOGRAPHY CHEST WITH CONTRAST TECHNIQUE: Multidetector CT imaging of the chest was performed using the standard protocol during bolus administration of intravenous contrast. Multiplanar CT image reconstructions and MIPs were obtained to evaluate the vascular anatomy. CONTRAST:  117mL OMNIPAQUE IOHEXOL 350 MG/ML SOLN COMPARISON:  11/13/2015.  Outside head CT report 05/07/2020 FINDINGS: Cardiovascular: Cardiomegaly. No pericardial effusion. Aortic and coronary atherosclerotic calcifications. Satisfactory opacification of the pulmonary arteries to at least the segmental level with no convincing pulmonary filling defect when allowing for intermittent streak and motion artifact, motion especially affecting the upper lobes. Mediastinum/Nodes: Negative for adenopathy or mass in the mediastinum. Negative  esophagus. There has been right axillary dissection with no axillary adenopathy. 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 Lungs/Pleura: Patchy ground-glass opacity in the bilateral lungs consistent with COVID pneumonia. There is a degree of bronchomalacia with intermittent airway narrowing. Per outside head CT 05/07/2020 there is pulmonary nodularity which is not assessed in this setting. Upper Abdomen: Negative Musculoskeletal: Trabecular coarsening in the medial right clavicle which is stable from 2017. small sclerotic focus in the upper and posterior sternum, a sternal sclerotic foci was described on outside PET-CT. Review of the MIP images confirms the above findings. IMPRESSION: 1. COVID pattern pneumonia. 2. No evidence of pulmonary embolism. 3. Cardiomegaly. Electronically Signed   By: Monte Fantasia M.D.   On: 07/31/2020 07:22   DG Chest Port 1 View  Result Date: 08/05/2020 CLINICAL DATA:  Shortness of breath and cough in a patient with COVID-19. EXAM: PORTABLE CHEST 1 VIEW COMPARISON:  CT chest and single view of the chest 08/04/2020 and 07/31/2020. FINDINGS: Left greater than right airspace disease persists appears worse than on the most recent comparison. No pneumothorax. Heart size is upper normal. Port-A-Cath is in place. IMPRESSION: Worsened bilateral airspace disease consistent with pneumonia. Electronically Signed   By: Inge Rise M.D.   On: 08/05/2020 09:01   DG Chest Port 1 View  Result Date: 08/04/2020 CLINICAL DATA:  Shortness of breath.  COVID positive. EXAM: PORTABLE CHEST 1 VIEW COMPARISON:  Radiographs and chest CTA 07/31/2020 FINDINGS: Left chest port remains in place. Bilateral COVID pneumonia with slight improvement in the upper lung zones from prior imaging. Mild unchanged elevation of right hemidiaphragm. No pneumomediastinum or pneumothorax. No significant pleural effusion. Surgical clips in the right axilla. IMPRESSION: Bilateral COVID pneumonia with  slight improvement in the upper lung zones from exams 4 days ago. Electronically Signed   By: Keith Rake M.D.   On: 08/04/2020 01:10   DG Chest Port 1 View  Result Date: 07/31/2020 CLINICAL DATA:  COVID.  Hypoxia. EXAM: PORTABLE CHEST 1 VIEW COMPARISON:  09/13/2017. FINDINGS: Port-A-Cath noted with tip over right atrium. Heart size normal. Lung volumes. Diffuse bilateral interstitial infiltrates are noted. Findings consistent pneumonitis. No pleural effusion or pneumothorax. Degenerative changes scoliosis thoracic spine. Degenerative changes both shoulders. No acute bony abnormality. Surgical clips right chest. IMPRESSION: 1. Port-A-Cath noted with tip over right atrium. 2. Low lung volumes. Diffuse bilateral interstitial infiltrates. Findings consistent with pneumonitis. Electronically Signed   By: Marcello Moores  Register   On: 07/31/2020 05:19   VAS [place] LOWER EXTREMITY VENOUS (DVT)  Result Date: 08/06/2020  Lower Venous DVT Study Other Indications: Covid+ "rapidly rising" d-dimer. Comparison Study: No prior venous study. Performing Technologist: Rogelia Rohrer  Examination Guidelines: A complete evaluation includes B-mode imaging, spectral Doppler, color Doppler, and power Doppler as needed of all accessible portions of each vessel. Bilateral testing is considered an integral part of a complete examination. Limited examinations for reoccurring indications may be performed as noted. The reflux portion of the exam is performed with the patient in reverse Trendelenburg.  +---------+---------------+---------+-----------+----------+--------------+ RIGHT    CompressibilityPhasicitySpontaneityPropertiesThrombus Aging +---------+---------------+---------+-----------+----------+--------------+ CFV      Full           Yes      Yes                                 +---------+---------------+---------+-----------+----------+--------------+ SFJ      Full                                                         +---------+---------------+---------+-----------+----------+--------------+  FV Prox  Full           Yes      Yes                                 +---------+---------------+---------+-----------+----------+--------------+ FV Mid   Full           Yes      Yes                                 +---------+---------------+---------+-----------+----------+--------------+ FV DistalFull           Yes      Yes                                 +---------+---------------+---------+-----------+----------+--------------+ PFV      Full                                                        +---------+---------------+---------+-----------+----------+--------------+ POP      Full           Yes      Yes                                 +---------+---------------+---------+-----------+----------+--------------+ PTV      Full                                                        +---------+---------------+---------+-----------+----------+--------------+ PERO     Full                                                        +---------+---------------+---------+-----------+----------+--------------+   +---------+---------------+---------+-----------+----------+--------------+ LEFT     CompressibilityPhasicitySpontaneityPropertiesThrombus Aging +---------+---------------+---------+-----------+----------+--------------+ CFV      Full           Yes      Yes                                 +---------+---------------+---------+-----------+----------+--------------+ SFJ      Full                                                        +---------+---------------+---------+-----------+----------+--------------+ FV Prox  Full           Yes      Yes                                 +---------+---------------+---------+-----------+----------+--------------+ FV Mid   Full  Yes      Yes                                  +---------+---------------+---------+-----------+----------+--------------+ FV DistalFull           Yes      Yes                                 +---------+---------------+---------+-----------+----------+--------------+ PFV      Full                                                        +---------+---------------+---------+-----------+----------+--------------+ POP      Full           Yes      Yes                                 +---------+---------------+---------+-----------+----------+--------------+ PTV      Full                                                        +---------+---------------+---------+-----------+----------+--------------+ PERO     Full                                                        +---------+---------------+---------+-----------+----------+--------------+     Summary: BILATERAL: - No evidence of deep vein thrombosis seen in the lower extremities, bilaterally. - RIGHT: - No cystic structure found in the popliteal fossa.  LEFT: - No cystic structure found in the popliteal fossa.  *See table(s) above for measurements and observations. Electronically signed by Deitra Mayo MD on 08/06/2020 at 6:26:48 PM.    Final

## 2020-08-10 NOTE — Progress Notes (Addendum)
Attempted to walk with patient around the room, after standing for about two minutes and taking a couple steps, patient said her legs were starting to feel weak. Per patient, PT will walk with her tomorrow.   Neuro assessment intact.

## 2020-08-10 NOTE — Evaluation (Addendum)
Physical Therapy Evaluation Patient Details Name: Claudia [name] MRN: [id number] DOB: [birth year] Today's Date: 08/10/2020   History of Present Illness  Claudia [name] is a 65 y.o. female with medical history significant of hypertension, hyperlipidemia, stage IV breast cancer, anxiety, depression, GERD, and morbid obesity presents back to the hospital with worsening shortness of breath, she was recently admitted at [facility] for COVID-19 pneumonia and discharged home on a steroid taper.  She also had recent diagnosis of left arm cellulitis which has improved considerably.  In the ER she was diagnosed with acute hypoxic respiratory failure, CTA was negative and she was admitted for further treatment.  Clinical Impression  Pt admitted with above diagnosis. Pt was able to ambulate with RW with min guard assist with good steady gait and no LOB.  Pt has RW at home she can use. She did desaturate to 86% on 3L with activity but rebounded to >90% within 5 min of rest.  Educated in performing incentive spirometer 10 x hour. Will follow acutely and progress pt as able. Sister is arranging caregivers so pt can go home. Pt currently with functional limitations due to the deficits listed below (see PT Problem List). Pt will benefit from skilled PT to increase their independence and safety with mobility to allow discharge to the venue listed below.      Follow Up Recommendations Home health PT;Supervision/Assistance - 24 hour    Equipment Recommendations   (?home O2)    Recommendations for Other Services       Precautions / Restrictions Precautions Precautions: Fall Restrictions Weight Bearing Restrictions: No      Mobility  Bed Mobility Overal bed mobility: Independent                  Transfers Overall transfer level: Needs assistance Equipment used: Rolling walker (2 wheeled) Transfers: Sit to/from [facility] Sit to Stand: Supervision Stand pivot  transfers: Supervision       General transfer comment: Pt was able to stand without assist but did need cues for hand placement.  Pt initially transferred to 3N1 as she stated she needed to have BM.  Pt had BM and did need assist to be cleaned.  Ambulation/Gait Ambulation/Gait assistance: Min guard Gait Distance (Feet): 20 Feet Assistive device: Rolling walker (2 wheeled) Gait Pattern/deviations: Step-through pattern;Decreased stride length;Trunk flexed;Wide base of support   Gait velocity interpretation: <1.31 ft/sec, indicative of household ambulator General Gait Details: Pt was able to ambulate forward and backward within reach of O2 line.  Pt fatigued after 20 feet and stayed up in recliner. Safe with RW.  Stairs            Wheelchair Mobility    Modified Rankin (Stroke Patients Only)       Balance Overall balance assessment: Needs assistance Sitting-balance support: No upper extremity supported;Feet supported Sitting balance-Leahy Scale: Fair     Standing balance support: Bilateral upper extremity supported;During functional activity Standing balance-Leahy Scale: Poor Standing balance comment: relies on UE support for balance.                             Pertinent Vitals/Pain Pain Assessment: No/denies pain    Home Living Family/patient expects to be discharged to:: Private residence Living Arrangements: Alone (sister arranging caregivers for pt per pt) Available Help at Discharge: Family;Available PRN/intermittently Type of Home: House Home Access: Stairs to enter Entrance Stairs-Rails: Right;Left Entrance Stairs-Number of Steps:  4 Home Layout: One level Home Equipment: Bedside commode;Walker - 2 wheels      Prior Function Level of Independence: Independent         Comments: retired     Engineer, [employer]   Dominant Hand: Right    Extremity/Trunk Assessment   Upper Extremity Assessment Upper Extremity Assessment: Defer to OT  evaluation    Lower Extremity Assessment Lower Extremity Assessment: Generalized weakness    Cervical / Trunk Assessment Cervical / Trunk Assessment: Normal  Communication   Communication: No difficulties  Cognition Arousal/Alertness: Awake/alert Behavior During Therapy: WFL for tasks assessed/performed Overall Cognitive Status: Within Functional Limits for tasks assessed                                        General Comments General comments (skin integrity, edema, etc.): 78 bpm, 23, 149/89, 3LO2 86% post transfer and to 90% after 5 min in chair    Exercises General Exercises - Lower Extremity Ankle Circles/Pumps: AROM;Both;10 reps;Seated Long Arc Quad: AROM;Both;10 reps;Seated   Assessment/Plan    PT Assessment Patient needs continued PT services  PT Problem List Decreased balance;Decreased mobility;Decreased activity tolerance;Decreased knowledge of use of DME;Decreased safety awareness;Decreased knowledge of precautions;Cardiopulmonary status limiting activity       PT Treatment Interventions DME instruction;Gait training;Stair training;Functional mobility training;Therapeutic activities;Therapeutic exercise;Balance training;Patient/family education    PT Goals (Current goals can be found in the Care Plan section)  Acute Rehab PT Goals Patient Stated Goal: to go home PT Goal Formulation: With patient Time For Goal Achievement: 08/24/20 Potential to Achieve Goals: Good    Frequency Min 3X/week   Barriers to discharge        Co-evaluation               AM-PAC PT "6 Clicks" Mobility  Outcome Measure Help needed turning from your back to your side while in a flat bed without using bedrails?: None Help needed moving from lying on your back to sitting on the side of a flat bed without using bedrails?: None Help needed moving to and from a bed to a chair (including a wheelchair)?: A Little Help needed standing up from a chair using your arms  (e.g., wheelchair or bedside chair)?: A Little Help needed to walk in hospital room?: A Little Help needed climbing 3-5 steps with a railing? : A Little 6 Click Score: 20    End of Session Equipment Utilized During Treatment: Gait belt;Oxygen Activity Tolerance: Patient limited by fatigue Patient left: in chair;with call bell/phone within reach;with chair alarm set Nurse Communication: Mobility status PT Visit Diagnosis: Muscle weakness (generalized) (M62.81)    Time: 9735-3299 PT Time Calculation (min) (ACUTE ONLY): 28 min   Charges:   PT Evaluation $PT Eval Moderate Complexity: 1 Mod PT Treatments $Gait Training: 8-22 mins        Rameen Gohlke W,PT Acute Rehabilitation Services Pager:  [phone]  Office:  [place] 08/10/2020, 11:02 AM

## 2020-08-11 DIAGNOSIS — J1282 Pneumonia due to coronavirus disease 2019: Secondary | ICD-10-CM | POA: Diagnosis not present

## 2020-08-11 DIAGNOSIS — U071 COVID-19: Secondary | ICD-10-CM | POA: Diagnosis not present

## 2020-08-11 DIAGNOSIS — J9601 Acute respiratory failure with hypoxia: Secondary | ICD-10-CM | POA: Diagnosis not present

## 2020-08-11 LAB — COMPREHENSIVE METABOLIC PANEL
ALT: 35 U/L (ref 0–44)
AST: 20 U/L (ref 15–41)
Albumin: 2.8 g/dL — ABNORMAL LOW (ref 3.5–5.0)
Alkaline Phosphatase: 74 U/L (ref 38–126)
Anion gap: 10 (ref 5–15)
BUN: 16 mg/dL (ref 8–23)
CO2: 26 mmol/L (ref 22–32)
Calcium: 8.7 mg/dL — ABNORMAL LOW (ref 8.9–10.3)
Chloride: 100 mmol/L (ref 98–111)
Creatinine, Ser: 0.72 mg/dL (ref 0.44–1.00)
GFR, Estimated: >60 mL/min (ref >60)
Glucose, Bld: 243 mg/dL — ABNORMAL HIGH (ref 70–99)
Potassium: 4.2 mmol/L (ref 3.5–5.1)
Sodium: 136 mmol/L (ref 135–145)
Total Bilirubin: 0.5 mg/dL (ref 0.3–1.2)
Total Protein: 6.6 g/dL (ref 6.5–8.1)

## 2020-08-11 LAB — CBC
HCT: 37.1 % (ref 36.0–46.0)
Hemoglobin: 11.4 g/dL — ABNORMAL LOW (ref 12.0–15.0)
MCH: 27.6 pg (ref 26.0–34.0)
MCHC: 30.7 g/dL (ref 30.0–36.0)
MCV: 89.8 fL (ref 80.0–100.0)
Platelets: 257 10*3/uL (ref 150–400)
RBC: 4.13 MIL/uL (ref 3.87–5.11)
RDW: 15.1 % (ref 11.5–15.5)
WBC: 14.9 10*3/uL — ABNORMAL HIGH (ref 4.0–10.5)
nRBC: 0 % (ref 0.0–0.2)

## 2020-08-11 LAB — GLUCOSE, CAPILLARY
Glucose-Capillary: 155 mg/dL — ABNORMAL HIGH (ref 70–99)
Glucose-Capillary: 119 mg/dL — ABNORMAL HIGH (ref 70–99)
Glucose-Capillary: 143 mg/dL — ABNORMAL HIGH (ref 70–99)
Glucose-Capillary: 166 mg/dL — ABNORMAL HIGH (ref 70–99)

## 2020-08-11 LAB — D-DIMER, QUANTITATIVE: D-Dimer, Quant: 2.1 ug/mL-FEU — ABNORMAL HIGH (ref 0.00–0.50)

## 2020-08-11 MED ORDER — ALBUTEROL SULFATE HFA 108 (90 BASE) MCG/ACT IN AERS: 2.0000 | INHALATION_SPRAY | Freq: Four times a day (QID) | RESPIRATORY_TRACT | Status: DC | PRN

## 2020-08-11 MED ORDER — ALBUTEROL SULFATE HFA 108 (90 BASE) MCG/ACT IN AERS
2.0000 | INHALATION_SPRAY | Freq: Three times a day (TID) | RESPIRATORY_TRACT | Status: DC
  Administered 2020-08-11 – 2020-08-12 (×4): 2 via RESPIRATORY_TRACT

## 2020-08-11 NOTE — Progress Notes (Signed)
PROGRESS NOTE                                                                                                                                                                                                             Patient Demographics:    Claudia [name], is a 65 y.o. female, DOB - [birth year], [id number]  Outpatient Primary MD for the patient is Pcp, No    LOS - 7  Admit date - 08/04/2020    Chief Complaint  Patient presents with  . Covid Positive       Brief Narrative (HPI from H&P)  Claudia [name] is a 65 y.o. female with medical history significant of hypertension, hyperlipidemia, stage IV breast cancer, anxiety, depression, GERD, and morbid obesity presents back to the hospital with worsening shortness of breath, he was recently admitted at [facility] for COVID-19 pneumonia and discharged home on a steroid taper.  She also had recent diagnosis of left arm cellulitis which has improved considerably.  In the ER she was diagnosed with acute hypoxic respiratory failure, CTA was negative and she was admitted for further treatment.   Subjective:   Patient in bed, appears comfortable, denies any headache, no fever, no chest pain or pressure, reports dyspnea, mainly with activity, reports cough has improved as well .   Assessment  & Plan :   1.  Acute Hypoxic Resp. Failure due to Acute Covid 19 Viral Pneumonitis  - she fortunately not vaccinated, initially diagnosed with COVID-19 pneumonia on 07/31/2020, finished remdesivir outpatient, was on oral steroid taper but looks like she has failed.  Continue with IV steroids, continue with her baricitinib, oxygen requirement appears to be improving, this morning she is on 2 L at rest. . - CTA rules out PE.    Encouraged the patient to sit up in chair in the daytime use I-S and flutter valve for pulmonary toiletry and then prone in bed when at night.  Will advance activity  and titrate down oxygen as possible.  SpO2: 90 % O2 Flow Rate (L/min): 2 L/min FiO2 (%): 100 %  Recent Labs  Lab 08/05/20 0517 08/05/20 0800 08/06/20 0439 08/07/20 0122 08/08/20 0220 08/09/20 0200 08/10/20 0623 08/11/20 0306  WBC 6.0  --  6.0 10.4 11.8* 9.3 12.2* 14.9*  HGB 11.4*  --  10.7* 11.3* 11.0* 11.1*  11.7* 11.4*  HCT 37.3  --  33.4* 35.3* 33.4* 34.4* 38.7 37.1  PLT 185  --  203 255 235 239 250 257  CRP 10.0*  --  9.3* 5.5* 2.0* 1.1*  --   --   BNP  --  36.4 58.9 51.0 40.0 30.8  --   --   DDIMER 2.20*  --  2.11* 2.49* 2.15* 2.21* 2.21* 2.10*  PROCALCITON  --   --  <0.10 <0.10 <0.10 <0.10  --   --   AST 16  --  16 14* 16 20 22 20   ALT 24  --  23 22 21 29  32 35  ALKPHOS 57  --  64 67 61 64 64 74  BILITOT 0.8  --  0.7 0.9 0.7 0.8 0.6 0.5  ALBUMIN 2.7*  --  2.5* 2.8* 2.6* 2.6* 2.8* 2.8*    2.  Recent left arm cellulitis.  Resolved.  3.  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  Supportive care outpatient follow-up with PCP and oncology.  4.  Hypokalemia.  Replaced and stable.   5.  Anxiety and depression.  On Zoloft and as needed benzodiazepine.  6.  Morbid obesity.  BMI of 42.  Outpatient follow-up with PCP.  7.  Hypertension.  Labetalol as needed for now.  8.  Elevated D-dimer.  Likely due to inflammation, CTA negative, negative leg ultrasound.  Now on moderate dose Lovenox.  D-dimer trending down which is reassuring.   Potassium is 5.1, will give 1 dose of Lokelma      Condition - Extremely Guarded  Family Communication  :   Discussed with patient in detail, she is updated and answered her questions  Code Status :  Full  Consults  :    Procedures  :    Leg [place] - no DVT  CTA - 1. No pulmonary emboli. 2. Worsened, more extensive distribution of bilateral patchy pulmonary infiltrates consistent with worsening viral pneumonia. No dense consolidation, lobar collapse or pleural effusion. 3. Aortic atherosclerosis. Aortic  Atherosclerosis  PUD Prophylaxis :  Pepcid  Disposition Plan  :    Status is: Inpatient  Remains inpatient appropriate because:IV treatments appropriate due to intensity of illness or inability to take PO   Dispo: The patient is from: Home              Anticipated d/c is to: Home              Anticipated d/c date is: 1 day              Patient currently is not medically stable to d/c.   DVT Prophylaxis  :  Lovenox    Lab Results  Component Value Date   PLT 257 08/11/2020    Diet :  Diet Order            Diet regular Room service appropriate? Yes; Fluid consistency: Thin  Diet effective now                  Inpatient Medications  Scheduled Meds: . albuterol  2 puff Inhalation TID  . anastrozole  1 mg Oral Daily  . vitamin C  500 mg Oral Daily  . baricitinib  4 mg Oral Daily  . Chlorhexidine Gluconate Cloth  6 each Topical Daily  . enoxaparin (LOVENOX) injection  60 mg Subcutaneous Q12H  . ezetimibe  10 mg Oral Daily  . famotidine  20 mg Oral BID  .  feeding supplement  237 mL Oral BID BM  . insulin aspart  0-15 Units Subcutaneous TID WC  . loratadine  10 mg Oral Daily  . methylPREDNISolone (SOLU-MEDROL) injection  60 mg Intravenous Q12H  . sertraline  250 mg Oral Daily  . sodium chloride flush  3 mL Intravenous Q12H  . zinc sulfate  220 mg Oral Daily   Continuous Infusions:  PRN Meds:.acetaminophen, albuterol, alprazolam, alum & mag hydroxide-simeth, chlorpheniramine-HYDROcodone, gabapentin, guaiFENesin-dextromethorphan, HYDROcodone-acetaminophen, labetalol, loperamide, [DISCONTINUED] ondansetron **OR** ondansetron (ZOFRAN) IV  Antibiotics  :    Anti-infectives (From admission, onward)   Start     Dose/Rate Route Frequency Ordered Stop   08/05/20 1000  remdesivir 100 mg in sodium chloride 0.9 % 100 mL IVPB  Status:  Discontinued       "Followed by" Linked Group Details   100 mg 200 mL/hr over 30 Minutes Intravenous Daily 08/04/20 0740 08/04/20 0745    08/04/20 1000  remdesivir 100 mg in sodium chloride 0.9 % 100 mL IVPB        100 mg 200 mL/hr over 30 Minutes Intravenous Daily 08/04/20 0745 08/05/20 1215   08/04/20 0745  remdesivir 200 mg in sodium chloride 0.9% 250 mL IVPB  Status:  Discontinued       "Followed by" Linked Group Details   200 mg 580 mL/hr over 30 Minutes Intravenous Once 08/04/20 0740 08/04/20 0745        Dawood Elgergawy M.D on 08/11/2020 at 3:24 PM  To page go to [email or web address]   [facility] -  Office  [phone]     See all Orders from today for further details    Objective:   Vitals:   08/11/20 0548 08/11/20 0739 08/11/20 0825 08/11/20 1203  BP: (!) 159/69 (!) 149/76 140/79 (!) 156/80  Pulse: 77 68 70 87  Resp: (!) 22 (!) 28 (!) 25 (!) 23  Temp: 97.6 F (36.4 C) 97.7 F (36.5 C) 97.6 F (36.4 C) 97.7 F (36.5 C)  TempSrc: Oral Oral Oral Oral  SpO2: 91% 91% 91% 90%  Weight:      Height:        Wt Readings from Last 3 Encounters:  08/10/20 115 kg  07/31/20 113.4 kg  07/23/20 113.4 kg     Intake/Output Summary (Last 24 hours) at 08/11/2020 1524 Last data filed at 08/11/2020 0300 Gross per 24 hour  Intake -  Output 300 ml  Net -300 ml     Physical Exam  Awake Alert, Oriented X 3, No new F.N deficits, Normal affect Symmetrical Chest wall movement, Good air movement bilaterally, CTAB RRR,No Gallops,Rubs or new Murmurs, No Parasternal Heave +ve B.Sounds, Abd Soft, No tenderness, No rebound - guarding or rigidity. No Cyanosis, Clubbing or edema, No new Rash or bruise       Data Review:    CBC Recent Labs  Lab 08/05/20 0517 08/06/20 0439 08/07/20 0122 08/08/20 0220 08/09/20 0200 08/10/20 0623 08/11/20 0306  WBC 6.0 6.0 10.4 11.8* 9.3 12.2* 14.9*  HGB 11.4* 10.7* 11.3* 11.0* 11.1* 11.7* 11.4*  HCT 37.3 33.4* 35.3* 33.4* 34.4* 38.7 37.1  PLT 185 203 255 235 239 250 257  MCV 90.8 89.1 87.4 88.1 88.9 90.6 89.8  MCH 27.7 28.5 28.0 29.0 28.7 27.4 27.6  MCHC 30.6 32.0  32.0 32.9 32.3 30.2 30.7  RDW 14.9 14.8 14.8 15.0 14.7 15.0 15.1  LYMPHSABS 1.5 0.5* 0.5* 0.7 0.7  --   --   MONOABS 0.3 0.2 0.5 0.4 0.6  --   --  EOSABS 0.0 0.0 0.0 0.0 0.0  --   --   BASOSABS 0.0 0.0 0.0 0.0 0.0  --   --     Recent Labs  Lab 08/05/20 0517 08/05/20 0800 08/05/20 1740 08/06/20 0439 08/07/20 0122 08/08/20 0220 08/09/20 0200 08/10/20 0623 08/11/20 0306  NA 141  --   --  138 136 136 135 138 136  K 2.8*  --    < > 4.2 4.2 4.3 4.2 5.1 4.2  CL 105  --   --  104 97* 97* 99 102 100  CO2 25  --   --  25 27 29 27 26 26   GLUCOSE 92  --   --  153* 155* 194* 243* 111* 243*  BUN 12  --   --  10 17 18 17 17 16   CREATININE 0.60  --   --  0.53 0.65 0.84 0.68 0.71 0.72  CALCIUM 8.4*  --   --  8.5* 9.0 8.6* 8.5* 8.7* 8.7*  AST 16  --   --  16 14* 16 20 22 20   ALT 24  --   --  23 22 21 29  32 35  ALKPHOS 57  --   --  64 67 61 64 64 74  BILITOT 0.8  --   --  0.7 0.9 0.7 0.8 0.6 0.5  ALBUMIN 2.7*  --   --  2.5* 2.8* 2.6* 2.6* 2.8* 2.8*  MG 2.1  --   --  2.1 2.1 2.2  --   --   --   CRP 10.0*  --   --  9.3* 5.5* 2.0* 1.1*  --   --   DDIMER 2.20*  --   --  2.11* 2.49* 2.15* 2.21* 2.21* 2.10*  PROCALCITON  --   --   --  <0.10 <0.10 <0.10 <0.10  --   --   HGBA1C  --   --   --   --   --   --   --  5.6  --   BNP  --  36.4  --  58.9 51.0 40.0 30.8  --   --    < > = values in this interval not displayed.    ------------------------------------------------------------------------------------------------------------------ No results for input(s): CHOL, HDL, LDLCALC, TRIG, CHOLHDL, LDLDIRECT in the last 72 hours.  Lab Results  Component Value Date   HGBA1C 5.6 08/10/2020   ------------------------------------------------------------------------------------------------------------------ No results for input(s): TSH, T4TOTAL, T3FREE, THYROIDAB in the last 72 hours.  Invalid input(s): FREET3  Cardiac Enzymes No results for input(s): CKMB, TROPONINI, MYOGLOBIN in the last 168  hours.  Invalid input(s): CK ------------------------------------------------------------------------------------------------------------------    Component Value Date/Time   BNP 30.8 08/09/2020 0200    Micro Results Recent Results (from the past 240 hour(s))  C Difficile Quick Screen w PCR reflex     Status: None   Collection Time: 08/04/20  5:49 PM   Specimen: STOOL  Result Value Ref Range Status   C Diff antigen NEGATIVE NEGATIVE Final   C Diff toxin NEGATIVE NEGATIVE Final   C Diff interpretation No C. difficile detected.  Final    Comment: Performed at [facility], [place] [address] [place], [place] [address]  MRSA PCR Screening     Status: None   Collection Time: 08/07/20  2:07 AM  Result Value Ref Range Status   MRSA by PCR NEGATIVE NEGATIVE Final    Comment:        The GeneXpert MRSA Assay (FDA approved  for NASAL specimens only), is one component of a comprehensive MRSA colonization surveillance program. It is not intended to diagnose MRSA infection nor to guide or monitor treatment for MRSA infections. Performed at [facility], [place] [address], [place],  [address]     Radiology Reports CT ANGIO CHEST PE W OR WO CONTRAST  Result Date: 08/04/2020 CLINICAL DATA:  Coronavirus infection. Worsening respiratory status and oxygen requirement. Question emboli. EXAM: CT ANGIOGRAPHY CHEST WITH CONTRAST TECHNIQUE: Multidetector CT imaging of the chest was performed using the standard protocol during bolus administration of intravenous contrast. Multiplanar CT image reconstructions and MIPs were obtained to evaluate the vascular anatomy. CONTRAST:  43mL OMNIPAQUE IOHEXOL 350 MG/ML SOLN COMPARISON:  07/31/2020 FINDINGS: Cardiovascular: Heart size is normal. There is aortic atherosclerosis without aneurysm or dissection. Pulmonary arterial opacification is good. There are no pulmonary emboli. Mediastinum/Nodes: No mediastinal or hilar mass or lymphadenopathy.  Lungs/Pleura: Bilateral hazy patchy pulmonary infiltrates have worsened in extent since the study 4 days ago. No dense consolidation, lobar collapse or pleural effusion. Upper Abdomen: Negative Musculoskeletal: Mastectomy and reconstruction on the right. Review of the MIP images confirms the above findings. IMPRESSION: 1. No pulmonary emboli. 2. Worsened, more extensive distribution of bilateral patchy pulmonary infiltrates consistent with worsening viral pneumonia. No dense consolidation, lobar collapse or pleural effusion. 3. Aortic atherosclerosis. Aortic Atherosclerosis (ICD10-I70.0). Electronically Signed   By: Nelson Chimes M.D.   On: 08/04/2020 14:00   CT Angio Chest PE W/Cm &/Or Wo Cm  Result Date: 07/31/2020 CLINICAL DATA:  COVID positive. Breast cancer with prior plans to start chemotherapy today. EXAM: CT ANGIOGRAPHY CHEST WITH CONTRAST TECHNIQUE: Multidetector CT imaging of the chest was performed using the standard protocol during bolus administration of intravenous contrast. Multiplanar CT image reconstructions and MIPs were obtained to evaluate the vascular anatomy. CONTRAST:  162mL OMNIPAQUE IOHEXOL 350 MG/ML SOLN COMPARISON:  11/13/2015.  Outside head CT report 05/07/2020 FINDINGS: Cardiovascular: Cardiomegaly. No pericardial effusion. Aortic and coronary atherosclerotic calcifications. Satisfactory opacification of the pulmonary arteries to at least the segmental level with no convincing pulmonary filling defect when allowing for intermittent streak and motion artifact, motion especially affecting the upper lobes. Mediastinum/Nodes: Negative for adenopathy or mass in the mediastinum. Negative esophagus. There has been right axillary dissection with no axillary adenopathy. 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 Lungs/Pleura: Patchy ground-glass opacity in the bilateral lungs consistent with COVID pneumonia. There is a degree of bronchomalacia with intermittent airway narrowing.  Per outside head CT 05/07/2020 there is pulmonary nodularity which is not assessed in this setting. Upper Abdomen: Negative Musculoskeletal: Trabecular coarsening in the medial right clavicle which is stable from 2017. small sclerotic focus in the upper and posterior sternum, a sternal sclerotic foci was described on outside PET-CT. Review of the MIP images confirms the above findings. IMPRESSION: 1. COVID pattern pneumonia. 2. No evidence of pulmonary embolism. 3. Cardiomegaly. Electronically Signed   By: Monte Fantasia M.D.   On: 07/31/2020 07:22   DG Chest Port 1 View  Result Date: 08/05/2020 CLINICAL DATA:  Shortness of breath and cough in a patient with COVID-19. EXAM: PORTABLE CHEST 1 VIEW COMPARISON:  CT chest and single view of the chest 08/04/2020 and 07/31/2020. FINDINGS: Left greater than right airspace disease persists appears worse than on the most recent comparison. No pneumothorax. Heart size is upper normal. Port-A-Cath is in place. IMPRESSION: Worsened bilateral airspace disease consistent with pneumonia. Electronically Signed   By: Inge Rise M.D.   On:  08/05/2020 09:01   DG Chest Port 1 View  Result Date: 08/04/2020 CLINICAL DATA:  Shortness of breath.  COVID positive. EXAM: PORTABLE CHEST 1 VIEW COMPARISON:  Radiographs and chest CTA 07/31/2020 FINDINGS: Left chest port remains in place. Bilateral COVID pneumonia with slight improvement in the upper lung zones from prior imaging. Mild unchanged elevation of right hemidiaphragm. No pneumomediastinum or pneumothorax. No significant pleural effusion. Surgical clips in the right axilla. IMPRESSION: Bilateral COVID pneumonia with slight improvement in the upper lung zones from exams 4 days ago. Electronically Signed   By: Keith Rake M.D.   On: 08/04/2020 01:10   DG Chest Port 1 View  Result Date: 07/31/2020 CLINICAL DATA:  COVID.  Hypoxia. EXAM: PORTABLE CHEST 1 VIEW COMPARISON:  09/13/2017. FINDINGS: Port-A-Cath noted with tip  over right atrium. Heart size normal. Lung volumes. Diffuse bilateral interstitial infiltrates are noted. Findings consistent pneumonitis. No pleural effusion or pneumothorax. Degenerative changes scoliosis thoracic spine. Degenerative changes both shoulders. No acute bony abnormality. Surgical clips right chest. IMPRESSION: 1. Port-A-Cath noted with tip over right atrium. 2. Low lung volumes. Diffuse bilateral interstitial infiltrates. Findings consistent with pneumonitis. Electronically Signed   By: Marcello Moores  Register   On: 07/31/2020 05:19   VAS [place] LOWER EXTREMITY VENOUS (DVT)  Result Date: 08/06/2020  Lower Venous DVT Study Other Indications: Covid+ "rapidly rising" d-dimer. Comparison Study: No prior venous study. Performing Technologist: Rogelia Rohrer  Examination Guidelines: A complete evaluation includes B-mode imaging, spectral Doppler, color Doppler, and power Doppler as needed of all accessible portions of each vessel. Bilateral testing is considered an integral part of a complete examination. Limited examinations for reoccurring indications may be performed as noted. The reflux portion of the exam is performed with the patient in reverse Trendelenburg.  +---------+---------------+---------+-----------+----------+--------------+ RIGHT    CompressibilityPhasicitySpontaneityPropertiesThrombus Aging +---------+---------------+---------+-----------+----------+--------------+ CFV      Full           Yes      Yes                                 +---------+---------------+---------+-----------+----------+--------------+ SFJ      Full                                                        +---------+---------------+---------+-----------+----------+--------------+ FV Prox  Full           Yes      Yes                                 +---------+---------------+---------+-----------+----------+--------------+ FV Mid   Full           Yes      Yes                                  +---------+---------------+---------+-----------+----------+--------------+ FV DistalFull           Yes      Yes                                 +---------+---------------+---------+-----------+----------+--------------+ PFV  Full                                                        +---------+---------------+---------+-----------+----------+--------------+ POP      Full           Yes      Yes                                 +---------+---------------+---------+-----------+----------+--------------+ PTV      Full                                                        +---------+---------------+---------+-----------+----------+--------------+ PERO     Full                                                        +---------+---------------+---------+-----------+----------+--------------+   +---------+---------------+---------+-----------+----------+--------------+ LEFT     CompressibilityPhasicitySpontaneityPropertiesThrombus Aging +---------+---------------+---------+-----------+----------+--------------+ CFV      Full           Yes      Yes                                 +---------+---------------+---------+-----------+----------+--------------+ SFJ      Full                                                        +---------+---------------+---------+-----------+----------+--------------+ FV Prox  Full           Yes      Yes                                 +---------+---------------+---------+-----------+----------+--------------+ FV Mid   Full           Yes      Yes                                 +---------+---------------+---------+-----------+----------+--------------+ FV DistalFull           Yes      Yes                                 +---------+---------------+---------+-----------+----------+--------------+ PFV      Full                                                         +---------+---------------+---------+-----------+----------+--------------+ POP  Full           Yes      Yes                                 +---------+---------------+---------+-----------+----------+--------------+ PTV      Full                                                        +---------+---------------+---------+-----------+----------+--------------+ PERO     Full                                                        +---------+---------------+---------+-----------+----------+--------------+     Summary: BILATERAL: - No evidence of deep vein thrombosis seen in the lower extremities, bilaterally. - RIGHT: - No cystic structure found in the popliteal fossa.  LEFT: - No cystic structure found in the popliteal fossa.  *See table(s) above for measurements and observations. Electronically signed by Deitra Mayo MD on 08/06/2020 at 6:26:48 PM.    Final

## 2020-08-11 NOTE — Plan of Care (Signed)
  Problem: Education: Goal: Knowledge of General Education information will improve Description: Including pain rating scale, medication(s)/side effects and non-pharmacologic comfort measures Outcome: Progressing   Problem: Health Behavior/Discharge Planning: Goal: Ability to manage health-related needs will improve Outcome: Progressing   Problem: Clinical Measurements: Goal: Ability to maintain clinical measurements within normal limits will improve Outcome: Progressing Goal: Will remain free from infection Outcome: Progressing Goal: Diagnostic test results will improve Outcome: Progressing Goal: Respiratory complications will improve Outcome: Progressing Goal: Cardiovascular complication will be avoided Outcome: Progressing   Problem: Activity: Goal: Risk for activity intolerance will decrease Outcome: Progressing   Problem: Nutrition: Goal: Adequate nutrition will be maintained Outcome: Progressing   Problem: Coping: Goal: Level of anxiety will decrease Outcome: Progressing   Problem: Education: Goal: Knowledge of risk factors and measures for prevention of condition will improve Outcome: Progressing   Problem: Coping: Goal: Psychosocial and spiritual needs will be supported Outcome: Progressing   Problem: Respiratory: Goal: Will maintain a patent airway Outcome: Progressing Goal: Complications related to the disease process, condition or treatment will be avoided or minimized Outcome: Progressing   

## 2020-08-11 NOTE — Evaluation (Signed)
Occupational Therapy Evaluation Patient Details Name: Claudia [name] MRN: [id number] DOB: [birth year] Today's Date: 08/11/2020    History of Present Illness Claudia [name] is a 65 y.o. female with medical history significant of hypertension, hyperlipidemia, stage IV breast cancer, anxiety, depression, GERD, and morbid obesity presents back to the hospital with worsening shortness of breath, she was recently admitted at [facility] for COVID-19 pneumonia and discharged home on a steroid taper.  She also had recent diagnosis of left arm cellulitis which has improved considerably.  In the ER she was diagnosed with acute hypoxic respiratory failure, CTA was negative and she was admitted for further treatment.   Clinical Impression   This 65 y/o female presents with the above. PTA pt reports independent with mobility tasks, requiring intermittent assist for LB ADL such as donning socks. Pt very pleasant and motivated to participate with therapy. Today pt performing x3 bouts of short distance mobility in room using RW at supervision level, completing toileting ADL with supervision and requiring modA for LB ADL. Pt on 2L O2 during session with SpO2 >/=87%, HR up to the 110s with activity. Pt reports she typically lives alone but has multiple family members who can check in PRN. She will benefit from continued acute OT services and currently recommend follow up [facility] services after discharge to maximize her overall safety and independence with ADL and mobility.     Follow Up Recommendations  Home health OT;Supervision/Assistance - 24 hour (close to 24hr initially)    Equipment Recommendations  None recommended by OT (pt's DME needs are met)           Precautions / Restrictions Precautions Precautions: Fall Restrictions Weight Bearing Restrictions: No      Mobility Bed Mobility               General bed mobility comments: OOB in recliner upon arrival    Transfers Overall  transfer level: Needs assistance Equipment used: Rolling walker (2 wheeled) Transfers: Sit to/from [facility] Sit to Stand: Supervision Stand pivot transfers: Supervision            Balance Overall balance assessment: Needs assistance Sitting-balance support: No upper extremity supported;Feet supported Sitting balance-Leahy Scale: Fair     Standing balance support: Bilateral upper extremity supported;During functional activity Standing balance-Leahy Scale: Fair Standing balance comment: static standing for pericare after toileting, stability improves with UE support                           ADL either performed or assessed with clinical judgement   ADL Overall ADL's : Needs assistance/impaired Eating/Feeding: Modified independent;Sitting   Grooming: Set up;Sitting   Upper Body Bathing: Modified independent;Sitting   Lower Body Bathing: Moderate assistance;Sit to/from stand   Upper Body Dressing : Modified independent;Sitting   Lower Body Dressing: Moderate assistance;Sit to/from stand Lower Body Dressing Details (indicate cue type and reason): supervision standing balance, assist to don socks today, reports typically requires assist for this Toilet Transfer: Supervision/safety;Stand-pivot;BSC   Toileting- Clothing Manipulation and Hygiene: Supervision/safety;Sit to/from stand Toileting - Clothing Manipulation Details (indicate cue type and reason): pt performing anterior pericare in standing     Functional mobility during ADLs: Supervision/safety;Min guard;Rolling walker                           Pertinent Vitals/Pain Pain Assessment: No/denies pain     Hand Dominance Right  Extremity/Trunk Assessment Upper Extremity Assessment Upper Extremity Assessment: Generalized weakness   Lower Extremity Assessment Lower Extremity Assessment: Defer to PT evaluation   Cervical / Trunk Assessment Cervical / Trunk Assessment:  Normal   Communication Communication Communication: No difficulties   Cognition Arousal/Alertness: Awake/alert Behavior During Therapy: WFL for tasks assessed/performed Overall Cognitive Status: Within Functional Limits for tasks assessed                                     General Comments       Exercises     Shoulder Instructions      Home Living Family/patient expects to be discharged to:: Private residence Living Arrangements: Alone (sister arranging caregivers for pt per pt) Available Help at Discharge: Family;Available PRN/intermittently Type of Home: House Home Access: Stairs to enter [employer] of Steps: 4 Entrance Stairs-Rails: Right;Left Home Layout: One level     Bathroom Shower/Tub: Teacher, early years/pre: Standard     Home Equipment: Bedside commode;Walker - 2 wheels;Shower seat - built in          Prior Functioning/Environment Level of Independence: Needs assistance  Gait / Transfers Assistance Needed: independent ADL's / Homemaking Assistance Needed: assist for LB ADL such as socks   Comments: retired        OT Problem List: Decreased activity tolerance;Decreased strength;Obesity;Cardiopulmonary status limiting activity;Decreased knowledge of use of DME or AE      OT Treatment/Interventions: Self-care/ADL training;Therapeutic exercise;Energy conservation;DME and/or AE instruction;Therapeutic activities;Patient/family education;Balance training    OT Goals(Current goals can be found in the care plan section) Acute Rehab OT Goals Patient Stated Goal: to go home OT Goal Formulation: With patient Time For Goal Achievement: 08/25/20 Potential to Achieve Goals: Good  OT Frequency: Min 2X/week   Barriers to D/C:            Co-evaluation              AM-PAC OT "6 Clicks" Daily Activity     Outcome Measure Help from another person eating meals?: None Help from another person taking care of personal  grooming?: A Little Help from another person toileting, which includes using toliet, bedpan, or urinal?: A Little Help from another person bathing (including washing, rinsing, drying)?: A Little Help from another person to put on and taking off regular upper body clothing?: None Help from another person to put on and taking off regular lower body clothing?: A Lot 6 Click Score: 19   End of Session Equipment Utilized During Treatment: Rolling walker;Oxygen Nurse Communication: Mobility status  Activity Tolerance: Patient tolerated treatment well Patient left: in chair;with call bell/phone within reach  OT Visit Diagnosis: Muscle weakness (generalized) (M62.81);Other (comment) (decreased activity tolerance)                Time: 3716-9678 OT Time Calculation (min): 22 min Charges:  OT General Charges $OT Visit: 1 Visit OT Evaluation $OT Eval Moderate Complexity: [place], OT Acute Rehabilitation Services Pager [phone] Office [phone]   Raymondo Band 08/11/2020, 2:27 PM

## 2020-08-12 DIAGNOSIS — Z853 Personal history of malignant neoplasm of breast: Secondary | ICD-10-CM

## 2020-08-12 DIAGNOSIS — J9601 Acute respiratory failure with hypoxia: Secondary | ICD-10-CM | POA: Diagnosis not present

## 2020-08-12 DIAGNOSIS — J1282 Pneumonia due to coronavirus disease 2019: Secondary | ICD-10-CM | POA: Diagnosis not present

## 2020-08-12 DIAGNOSIS — U071 COVID-19: Secondary | ICD-10-CM | POA: Diagnosis not present

## 2020-08-12 LAB — COMPREHENSIVE METABOLIC PANEL
ALT: 35 U/L (ref 0–44)
AST: 17 U/L (ref 15–41)
Albumin: 2.8 g/dL — ABNORMAL LOW (ref 3.5–5.0)
Alkaline Phosphatase: 72 U/L (ref 38–126)
Anion gap: 9 (ref 5–15)
BUN: 22 mg/dL (ref 8–23)
CO2: 26 mmol/L (ref 22–32)
Calcium: 8.9 mg/dL (ref 8.9–10.3)
Chloride: 98 mmol/L (ref 98–111)
Creatinine, Ser: 0.72 mg/dL (ref 0.44–1.00)
GFR, Estimated: >60 mL/min (ref >60)
Glucose, Bld: 168 mg/dL — ABNORMAL HIGH (ref 70–99)
Potassium: 4.6 mmol/L (ref 3.5–5.1)
Sodium: 133 mmol/L — ABNORMAL LOW (ref 135–145)
Total Bilirubin: 0.7 mg/dL (ref 0.3–1.2)
Total Protein: 6.3 g/dL — ABNORMAL LOW (ref 6.5–8.1)

## 2020-08-12 LAB — CBC
HCT: 36.1 % (ref 36.0–46.0)
Hemoglobin: 11.8 g/dL — ABNORMAL LOW (ref 12.0–15.0)
MCH: 29.2 pg (ref 26.0–34.0)
MCHC: 32.7 g/dL (ref 30.0–36.0)
MCV: 89.4 fL (ref 80.0–100.0)
Platelets: 277 10*3/uL (ref 150–400)
RBC: 4.04 MIL/uL (ref 3.87–5.11)
RDW: 15.2 % (ref 11.5–15.5)
WBC: 16.4 10*3/uL — ABNORMAL HIGH (ref 4.0–10.5)
nRBC: 0 % (ref 0.0–0.2)

## 2020-08-12 LAB — GLUCOSE, CAPILLARY
Glucose-Capillary: 100 mg/dL — ABNORMAL HIGH (ref 70–99)
Glucose-Capillary: 67 mg/dL — ABNORMAL LOW (ref 70–99)

## 2020-08-12 LAB — D-DIMER, QUANTITATIVE: D-Dimer, Quant: 2.24 ug/mL-FEU — ABNORMAL HIGH (ref 0.00–0.50)

## 2020-08-12 MED ORDER — METHYLPREDNISOLONE SODIUM SUCC 40 MG IJ SOLR
40.0000 mg | Freq: Two times a day (BID) | INTRAMUSCULAR | Status: DC
  Filled 2020-08-12: qty 1

## 2020-08-12 MED ORDER — AMLODIPINE BESY-BENAZEPRIL HCL 10-20 MG PO CAPS: 1.0000 | ORAL_CAPSULE | Freq: Every day | ORAL | Status: DC

## 2020-08-12 MED ORDER — HEPARIN SOD (PORK) LOCK FLUSH 100 UNIT/ML IV SOLN
500.0000 [IU] | INTRAVENOUS | Status: AC | PRN
  Administered 2020-08-12: 500 [IU]
  Filled 2020-08-12: qty 5

## 2020-08-12 MED ORDER — DEXAMETHASONE 6 MG PO TABS: 6.0000 mg | ORAL_TABLET | Freq: Every day | ORAL | 0 refills | Status: AC

## 2020-08-12 NOTE — TOC Initial Note (Addendum)
Transition of Care [facility]) - Initial/Assessment Note    Patient Details  Name: Claudia [name] MRN: [id number] Date of Birth: 06-15-1956  Transition of Care [facility]) CM/SW Contact:    Bethena Roys, RN Phone Number: 08/12/2020, 2:28 PM  Clinical Narrative: Patient presented for shortness of breath- COVID positive. Patient has oxygen at home via [employer]. Patient states she has durable medical equipment rolling walker and bedside commode. Patient discussed that she has a concentrator at home and will need portable tanks. Case Manager relayed information to [employer]. Case Manager discussed home health with the patient. Patient has no preference with agencies. Case Manager called [facility] is not a service area. Case Manager then called [facility]-agency-will be able to service for Registered Nurse, Physical Therapy, and Occupational Therapy. Start of care to begin within 24-48 hours post transition home. Patient states granddaughter will be able to assist with 24/7 supervision in the home. Patient states she will not have transportation home- [facility] arranged and has been called at 1400. Plan for ETA within the hour. Patient states the granddaughter will be home to receive her. Patient is aware to schedule a hospital follow up appointment. No further needs from Case Manager at this time.                  Expected Discharge Plan: [place] Barriers to Discharge: No Barriers Identified   Patient Goals and CMS Choice Patient states their goals for this hospitalization and ongoing recovery are:: tp return home with [facility].      Expected Discharge Plan and Services Expected Discharge Plan: [place] In-house Referral: NA Discharge Planning Services: CM Consult Post Acute Care Choice: Home Health,Durable Medical Equipment Living arrangements for the past 2 months: Single Family Home Expected Discharge Date: 08/12/20               DME  Arranged: Oxygen DME Agency: [employer] Date DME Agency Contacted: 08/12/20 Time DME Agency Contacted: 31 Representative spoke with at DME Agency: Freda Munro HH Arranged: RN,Disease Management,PT,OT [place] Agency: [place] ([place]) Date [place]: 08/12/20 Time HH Agency Contacted: 1300 Representative spoke with at [place]: [place]  Prior Living Arrangements/Services Living arrangements for the past 2 months: [place] with:: Relatives (Pt is stating that her granddaughter will be able to assist with supervision.) Patient language and need for interpreter reviewed:: Yes Do you feel safe going back to the place where you live?: Yes      Need for Family Participation in Patient Care: Yes (Comment) Care giver support system in place?: Yes (comment) Current home services: DME (Pt has oxygen via [employer] concentrator at home. Pt states she has a RW and BSC) Criminal Activity/Legal Involvement Pertinent to Current Situation/Hospitalization: No - Comment as needed  Activities of Daily Living Home Assistive Devices/Equipment: None ADL Screening (condition at time of admission) Patient's cognitive ability adequate to safely complete daily activities?: Yes Is the patient deaf or have difficulty hearing?: No Does the patient have difficulty seeing, even when wearing glasses/contacts?: No Does the patient have difficulty concentrating, remembering, or making decisions?: No Patient able to express need for assistance with ADLs?: Yes Does the patient have difficulty dressing or bathing?: No Independently performs ADLs?: Yes (appropriate for developmental age) Does the patient have difficulty walking or climbing stairs?: No Weakness of Legs: Both Weakness of Arms/Hands: None  Permission Sought/Granted Permission sought to share information with : Family Estate agent  Permission granted to share info w AGENCY:  [employer], [facility]        Emotional Assessment Appearance:: Appears stated age Attitude/Demeanor/Rapport: Engaged Affect (typically observed): Accepting,Appropriate Orientation: : Oriented to Self,Oriented to Place,Oriented to  Time,Oriented to Situation Alcohol / Substance Use: Not Applicable Psych Involvement: No (comment)  Admission diagnosis:  Hypokalemia [E87.6] SOB (shortness of breath) [R06.02] Acute respiratory failure with hypoxia (HCC) [J96.01] Acute respiratory failure due to severe acute respiratory syndrome coronavirus 2 (SARS-CoV-2) infection (HCC) [U07.1, J96.00] Acute hypoxemic respiratory failure due to COVID-19 ([place]) [U07.1, J96.01] Patient Active Problem List   Diagnosis Date Noted  . Acute hypoxemic respiratory failure due to COVID-19 ([place]) 08/04/2020  . Hypokalemia 08/04/2020  . Hypoalbuminemia 08/04/2020  . Acute respiratory failure with hypoxia ([place]) 07/31/2020  . Pneumonia due to COVID-19 virus 07/31/2020  . AKI (acute kidney injury) ([place]) 07/31/2020  . Obesity, Class III, BMI 40-49.9 (morbid obesity) ([place]) 07/31/2020  . Presence of left artificial knee joint 09/30/2017  . Arthritis of left knee 09/20/2017  . Closed fracture of medial malleolus of left ankle with delayed healing 05/29/2014  . Closed trimalleolar fracture of left ankle 10/10/2013  . Thyroid nodule 10/23/2012  . Ataxia 10/22/2012  . Vertigo 10/22/2012  . History of breast cancer 10/22/2012  . Panic disorder 10/22/2012  . Depression 08/31/2012  . Anxiety 08/31/2012   PCP:  Pcp, No Pharmacy:   [facility], [place] [address] Phone: [phone] Fax: [phone]   Readmission Risk Interventions No flowsheet data found.

## 2020-08-12 NOTE — Discharge Summary (Addendum)
Claudia [name], is a 65 y.o. female  DOB July 30, 1955  MRN [id number].  Admission date:  08/04/2020  Admitting Physician  Norval Morton, MD  Discharge Date:  08/12/2020   Primary MD  Pcp, No  Recommendations for primary care physician for things to follow:  -Please check CBC, CMP during next visit   Admission Diagnosis  Hypokalemia [E87.6] SOB (shortness of breath) [R06.02] Acute respiratory failure with hypoxia (HCC) [J96.01] Acute respiratory failure due to severe acute respiratory syndrome coronavirus 2 (SARS-CoV-2) infection (HCC) [U07.1, J96.00] Acute hypoxemic respiratory failure due to COVID-19 ([place]) [U07.1, J96.01]   Discharge Diagnosis  Hypokalemia [E87.6] SOB (shortness of breath) [R06.02] Acute respiratory failure with hypoxia (HCC) [J96.01] Acute respiratory failure due to severe acute respiratory syndrome coronavirus 2 (SARS-CoV-2) infection (HCC) [U07.1, J96.00] Acute hypoxemic respiratory failure due to COVID-19 ([place]) [U07.1, J96.01]    Principal Problem:   Acute hypoxemic respiratory failure due to COVID-19 [facility]) Active Problems:   Depression   Anxiety   History of breast cancer   Pneumonia due to COVID-19 virus   Obesity, Class III, BMI 40-49.9 (morbid obesity) ([place])   Hypokalemia   Hypoalbuminemia      Past Medical History:  Diagnosis Date  . Anxiety   . Breast cancer ([place])   . Cancer of right breast ([place])    mastectomy  . Depression   . GERD (gastroesophageal reflux disease)   . Hiatal hernia   . Hypercholesterolemia   . Hypertension   . Irritable bowel   . PONV (postoperative nausea and vomiting)     Past Surgical History:  Procedure Laterality Date  . ABDOMINAL HYSTERECTOMY    . BREAST SURGERY     lumpectomy  . CHOLECYSTECTOMY    . FOOT SURGERY     bilateral spur removal  . HARDWARE REMOVAL Left 05/29/2014   Procedure: REMOVAL OF SCREW AND K-WIRE;   Surgeon: Sanjuana Kava, MD;  Location: [facility];  Service: Orthopedics;  Laterality: Left;  . JOINT REPLACEMENT Left 2017   Ankle  . MASTECTOMY    . ORIF ANKLE FRACTURE Left 10/10/2013   Procedure: OPEN REDUCTION INTERNAL FIXATION (ORIF) ANKLE FRACTURE;  Surgeon: Sanjuana Kava, MD;  Location: [facility];  Service: Orthopedics;  Laterality: Left;  . ORIF ANKLE FRACTURE Left 05/29/2014   Procedure: OPEN REDUCTION INTERNAL FIXATION (ORIF) ANKLE FRACTURE AND PLACEMENT OF BONE GRAFT MATERIAL;  Surgeon: Sanjuana Kava, MD;  Location: [facility];  Service: Orthopedics;  Laterality: Left;  . TONSILLECTOMY    . TOTAL ABDOMINAL HYSTERECTOMY W/ BILATERAL SALPINGOOPHORECTOMY    . TOTAL KNEE ARTHROPLASTY Left 09/20/2017   Procedure: LEFT TOTAL KNEE ARTHROPLASTY;  Surgeon: Marybelle Killings, MD;  Location: [place];  Service: Orthopedics;  Laterality: Left;  . TUBAL LIGATION         History of present illness and  Hospital Course:     Kindly see H&P for history of present illness and admission details, please review complete Labs, Consult reports and Test reports for all details in brief  HPI  from the history and physical done on the day of admission 08/04/2020   HPI: ANISTYNN [name] is a 65 y.o. female with medical history significant of hypertension, hyperlipidemia, stage IV breast cancer, anxiety, depression, GERD, and morbid obesity presents back to the hospital with worsening shortness of breath.  Just recently hospitalized at [facility] 1/5-1/6 for acute respiratory failure secondary to pneumonia due to COVID-19.  She received 1 dose of remdesivir along with steroids while in hospital.  She had been discharged home on 3 L of oxygen and was able to receive 2 outpatient infusions of remdesivir.  Her cough is unchanged and she was just very weak.  Reported associated symptoms of diarrhea and had been feeling more anxious.  At home she is checking her pulse ox and noted that her oxygen was dipping into the 70s.   Cellulitis of her left arm is better.  ED Course: Upon admission into the emergency department patient was seen to be afebrile, with pulse 92-1 20, respirations 17-44, blood pressures 91/76 -148/77, and O2 saturations as low as 80% with improvement on 10 L of oxygen to 93%.  Labs significant for hemoglobin 11.9, potassium 2.7, albumin 3, CRP 6.7, procalcitonin <0.1, and D-dimer 2.38.  Chest x-ray showed bilateral Covid pneumonia with slight improvement in upper lung zones.  Patient had been given 6 mg of Decadron IV, potassium 40 mEq p.o., and 30 mEq IV.  TRH called to admit.  Hospital Course   Acute Hypoxic Resp. Failure due to Acute Covid 19 Viral Pneumonitis  - she fortunately not vaccinated, initially diagnosed with COVID-19 pneumonia on 07/31/2020, finished remdesivir outpatient, was on oral steroid taper but looks like she has failed.  She was treated with IV steroids, baricitinib during hospital stay, improved oxygen requirement, she is currently requiring 2 L oxygen via nasal cannula, which will be arranged on discharge. - CTA rules out PE.   -Encouraged to take her incentive spirometry and flutter valve and keep using it at home  Recent left arm cellulitis.  Resolved.  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  Supportive care outpatient follow-up with PCP and oncology.  Hypokalemia.  Replaced and stable.   Anxiety and depression.  On Zoloft and as needed benzodiazepine.   Morbid obesity.  BMI of 42.  Outpatient follow-up with PCP.  Hypertension.    Lotrel has been held during hospital stay, this can be resumed in 3 days as an outpatient as blood pressure started to increase.  Elevated D-dimer.  Likely due to inflammation, CTA chest negative for PE, negative lower extremity ultrasound, no indication for anticoagulation on discharge.  Discharge Condition:  stable   Follow UP      Discharge Instructions  and  Discharge Medications     Discharge Instructions    Diet - low sodium heart healthy   Complete by: As directed    Discharge instructions   Complete by: As directed    Follow with Primary MD Pcp  Get CBC, CMP,  checked  by Primary MD next visit.    Activity: As tolerated with Full fall precautions use walker/cane & assistance as needed   Disposition Home   Diet: Heart Healthy  For Heart failure patients - Check your Weight same time everyday, if you gain over 2 pounds, or you develop in leg swelling, experience more shortness of breath or chest pain, call your Primary MD immediately. Follow Cardiac Low Salt Diet and 1.5 lit/day  fluid restriction.   On your next visit with your primary care physician please Get Medicines reviewed and adjusted.   Please request your Prim.MD to go over all Hospital Tests and Procedure/Radiological results at the follow up, please get all Hospital records sent to your Prim MD by signing hospital release before you go home.   If you experience worsening of your admission symptoms, develop shortness of breath, life threatening emergency, suicidal or homicidal thoughts you must seek medical attention immediately by calling 911 or calling your MD immediately  if symptoms less severe.  You Must read complete instructions/literature along with all the possible adverse reactions/side effects for all the Medicines you take and that have been prescribed to you. Take any new Medicines after you have completely understood and accpet all the possible adverse reactions/side effects.   Do not drive, operating heavy machinery, perform activities at heights, swimming or participation in water activities or provide baby sitting services if your were admitted for syncope or siezures until you have seen by Primary MD or a Neurologist and advised to do so again.  Do not drive when taking Pain medications.    Do not take more than prescribed Pain, Sleep and Anxiety Medications  Special  Instructions: If you have smoked or chewed Tobacco  in the last 2 yrs please stop smoking, stop any regular Alcohol  and or any Recreational drug use.  Wear Seat belts while driving.   Please note  You were cared for by a hospitalist during your hospital stay. If you have any questions about your discharge medications or the care you received while you were in the hospital after you are discharged, you can call the unit and asked to speak with the hospitalist on call if the hospitalist that took care of you is not available. Once you are discharged, your primary care physician will handle any further medical issues. Please note that NO REFILLS for any discharge medications will be authorized once you are discharged, as it is imperative that you return to your primary care physician (or establish a relationship with a primary care physician if you do not have one) for your aftercare needs so that they can reassess your need for medications and monitor your lab values.   Increase activity slowly   Complete by: As directed      Allergies as of 08/12/2020      Reactions   Statins Other (See Comments)   Muscle pain      Medication List    STOP taking these medications   predniSONE 50 MG tablet Commonly known as: DELTASONE     TAKE these medications   albuterol 108 (90 Base) MCG/ACT inhaler Commonly known as: VENTOLIN HFA Inhale 2 puffs into the lungs 3 (three) times daily.   alprazolam 2 MG tablet Commonly known as: XANAX Take 2 mg by mouth 4 (four) times daily.   amLODipine-benazepril 10-20 MG capsule Commonly known as: LOTREL Take 1 capsule by mouth daily. Start in 3 days Start taking on: August 15, 2020 What changed:   additional instructions  These instructions start on August 15, 2020. If you are unsure what to do until then, ask your doctor or other care provider.   anastrozole 1 MG tablet Commonly known as: ARIMIDEX Take 1 tablet by mouth daily.   cetirizine 10 MG  tablet Commonly known as: ZYRTEC Take 10 mg by mouth daily.   dexamethasone 6 MG tablet Commonly known as: DECADRON Take 1 tablet (6 mg total)  by mouth daily for 2 days. Start taking on: August 13, 2020   ezetimibe 10 MG tablet Commonly known as: ZETIA Take 1 tablet (10 mg total) by mouth daily.   gabapentin 100 MG capsule Commonly known as: NEURONTIN Take 200 mg by mouth 3 (three) times daily as needed (burning/tingling).   guaiFENesin-dextromethorphan 100-10 MG/5ML syrup Commonly known as: ROBITUSSIN DM Take 10 mLs by mouth every 4 (four) hours as needed for cough.   HYDROcodone-acetaminophen 5-325 MG tablet Commonly known as: Norco Take 1 tablet by mouth at bedtime as needed for moderate pain.   sertraline 100 MG tablet Commonly known as: ZOLOFT Take 250 mg by mouth daily.            Durable Medical Equipment  (From admission, onward)         Start     Ordered   08/12/20 1222  For home use only DME Bedside commode  Once       Question:  Patient needs a bedside commode to treat with the following condition  Answer:  Acute respiratory disease due to COVID-19 virus   08/12/20 1221   08/12/20 1215  For home use only DME Bedside commode  Once       Question:  Patient needs a bedside commode to treat with the following condition  Answer:  General weakness   08/12/20 1214   08/12/20 1211  DME Oxygen  Once       Question Answer Comment  Length of Need 6 Months   Mode or (Route) Nasal cannula   Liters per Minute 2   Frequency Continuous (stationary and portable oxygen unit needed)   Oxygen conserving device Yes   Oxygen delivery system Gas      08/12/20 1210   08/10/20 1303  For home use only DME oxygen  Once       Question Answer Comment  Length of Need 6 Months   Mode or (Route) Nasal cannula   Liters per Minute 3   Oxygen delivery system Gas      08/10/20 1302            Diet and Activity recommendation: See Discharge Instructions  above   Consults obtained -  none   Major procedures and Radiology Reports - PLEASE review detailed and final reports for all details, in brief -     CT ANGIO CHEST PE W OR WO CONTRAST  Result Date: 08/04/2020 CLINICAL DATA:  Coronavirus infection. Worsening respiratory status and oxygen requirement. Question emboli. EXAM: CT ANGIOGRAPHY CHEST WITH CONTRAST TECHNIQUE: Multidetector CT imaging of the chest was performed using the standard protocol during bolus administration of intravenous contrast. Multiplanar CT image reconstructions and MIPs were obtained to evaluate the vascular anatomy. CONTRAST:  52mL OMNIPAQUE IOHEXOL 350 MG/ML SOLN COMPARISON:  07/31/2020 FINDINGS: Cardiovascular: Heart size is normal. There is aortic atherosclerosis without aneurysm or dissection. Pulmonary arterial opacification is good. There are no pulmonary emboli. Mediastinum/Nodes: No mediastinal or hilar mass or lymphadenopathy. Lungs/Pleura: Bilateral hazy patchy pulmonary infiltrates have worsened in extent since the study 4 days ago. No dense consolidation, lobar collapse or pleural effusion. Upper Abdomen: Negative Musculoskeletal: Mastectomy and reconstruction on the right. Review of the MIP images confirms the above findings. IMPRESSION: 1. No pulmonary emboli. 2. Worsened, more extensive distribution of bilateral patchy pulmonary infiltrates consistent with worsening viral pneumonia. No dense consolidation, lobar collapse or pleural effusion. 3. Aortic atherosclerosis. Aortic Atherosclerosis (ICD10-I70.0). Electronically Signed   By: Jan Fireman.D.  On: 08/04/2020 14:00   CT Angio Chest PE W/Cm &/Or Wo Cm  Result Date: 07/31/2020 CLINICAL DATA:  COVID positive. Breast cancer with prior plans to start chemotherapy today. EXAM: CT ANGIOGRAPHY CHEST WITH CONTRAST TECHNIQUE: Multidetector CT imaging of the chest was performed using the standard protocol during bolus administration of intravenous contrast.  Multiplanar CT image reconstructions and MIPs were obtained to evaluate the vascular anatomy. CONTRAST:  170mL OMNIPAQUE IOHEXOL 350 MG/ML SOLN COMPARISON:  11/13/2015.  Outside head CT report 05/07/2020 FINDINGS: Cardiovascular: Cardiomegaly. No pericardial effusion. Aortic and coronary atherosclerotic calcifications. Satisfactory opacification of the pulmonary arteries to at least the segmental level with no convincing pulmonary filling defect when allowing for intermittent streak and motion artifact, motion especially affecting the upper lobes. Mediastinum/Nodes: Negative for adenopathy or mass in the mediastinum. Negative esophagus. There has been right axillary dissection with no axillary adenopathy. 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 Lungs/Pleura: Patchy ground-glass opacity in the bilateral lungs consistent with COVID pneumonia. There is a degree of bronchomalacia with intermittent airway narrowing. Per outside head CT 05/07/2020 there is pulmonary nodularity which is not assessed in this setting. Upper Abdomen: Negative Musculoskeletal: Trabecular coarsening in the medial right clavicle which is stable from 2017. small sclerotic focus in the upper and posterior sternum, a sternal sclerotic foci was described on outside PET-CT. Review of the MIP images confirms the above findings. IMPRESSION: 1. COVID pattern pneumonia. 2. No evidence of pulmonary embolism. 3. Cardiomegaly. Electronically Signed   By: Monte Fantasia M.D.   On: 07/31/2020 07:22   DG Chest Port 1 View  Result Date: 08/05/2020 CLINICAL DATA:  Shortness of breath and cough in a patient with COVID-19. EXAM: PORTABLE CHEST 1 VIEW COMPARISON:  CT chest and single view of the chest 08/04/2020 and 07/31/2020. FINDINGS: Left greater than right airspace disease persists appears worse than on the most recent comparison. No pneumothorax. Heart size is upper normal. Port-A-Cath is in place. IMPRESSION: Worsened bilateral airspace  disease consistent with pneumonia. Electronically Signed   By: Inge Rise M.D.   On: 08/05/2020 09:01   DG Chest Port 1 View  Result Date: 08/04/2020 CLINICAL DATA:  Shortness of breath.  COVID positive. EXAM: PORTABLE CHEST 1 VIEW COMPARISON:  Radiographs and chest CTA 07/31/2020 FINDINGS: Left chest port remains in place. Bilateral COVID pneumonia with slight improvement in the upper lung zones from prior imaging. Mild unchanged elevation of right hemidiaphragm. No pneumomediastinum or pneumothorax. No significant pleural effusion. Surgical clips in the right axilla. IMPRESSION: Bilateral COVID pneumonia with slight improvement in the upper lung zones from exams 4 days ago. Electronically Signed   By: Keith Rake M.D.   On: 08/04/2020 01:10   DG Chest Port 1 View  Result Date: 07/31/2020 CLINICAL DATA:  COVID.  Hypoxia. EXAM: PORTABLE CHEST 1 VIEW COMPARISON:  09/13/2017. FINDINGS: Port-A-Cath noted with tip over right atrium. Heart size normal. Lung volumes. Diffuse bilateral interstitial infiltrates are noted. Findings consistent pneumonitis. No pleural effusion or pneumothorax. Degenerative changes scoliosis thoracic spine. Degenerative changes both shoulders. No acute bony abnormality. Surgical clips right chest. IMPRESSION: 1. Port-A-Cath noted with tip over right atrium. 2. Low lung volumes. Diffuse bilateral interstitial infiltrates. Findings consistent with pneumonitis. Electronically Signed   By: Marcello Moores  Register   On: 07/31/2020 05:19   VAS [place] LOWER EXTREMITY VENOUS (DVT)  Result Date: 08/06/2020  Lower Venous DVT Study Other Indications: Covid+ "rapidly rising" d-dimer. Comparison Study: No prior venous study. Performing Technologist: Rogelia Rohrer  Examination  Guidelines: A complete evaluation includes B-mode imaging, spectral Doppler, color Doppler, and power Doppler as needed of all accessible portions of each vessel. Bilateral testing is considered an integral part of a complete  examination. Limited examinations for reoccurring indications may be performed as noted. The reflux portion of the exam is performed with the patient in reverse Trendelenburg.  +---------+---------------+---------+-----------+----------+--------------+ RIGHT    CompressibilityPhasicitySpontaneityPropertiesThrombus Aging +---------+---------------+---------+-----------+----------+--------------+ CFV      Full           Yes      Yes                                 +---------+---------------+---------+-----------+----------+--------------+ SFJ      Full                                                        +---------+---------------+---------+-----------+----------+--------------+ FV Prox  Full           Yes      Yes                                 +---------+---------------+---------+-----------+----------+--------------+ FV Mid   Full           Yes      Yes                                 +---------+---------------+---------+-----------+----------+--------------+ FV DistalFull           Yes      Yes                                 +---------+---------------+---------+-----------+----------+--------------+ PFV      Full                                                        +---------+---------------+---------+-----------+----------+--------------+ POP      Full           Yes      Yes                                 +---------+---------------+---------+-----------+----------+--------------+ PTV      Full                                                        +---------+---------------+---------+-----------+----------+--------------+ PERO     Full                                                        +---------+---------------+---------+-----------+----------+--------------+   +---------+---------------+---------+-----------+----------+--------------+ LEFT  CompressibilityPhasicitySpontaneityPropertiesThrombus Aging  +---------+---------------+---------+-----------+----------+--------------+ CFV      Full           Yes      Yes                                 +---------+---------------+---------+-----------+----------+--------------+ SFJ      Full                                                        +---------+---------------+---------+-----------+----------+--------------+ FV Prox  Full           Yes      Yes                                 +---------+---------------+---------+-----------+----------+--------------+ FV Mid   Full           Yes      Yes                                 +---------+---------------+---------+-----------+----------+--------------+ FV DistalFull           Yes      Yes                                 +---------+---------------+---------+-----------+----------+--------------+ PFV      Full                                                        +---------+---------------+---------+-----------+----------+--------------+ POP      Full           Yes      Yes                                 +---------+---------------+---------+-----------+----------+--------------+ PTV      Full                                                        +---------+---------------+---------+-----------+----------+--------------+ PERO     Full                                                        +---------+---------------+---------+-----------+----------+--------------+     Summary: BILATERAL: - No evidence of deep vein thrombosis seen in the lower extremities, bilaterally. - RIGHT: - No cystic structure found in the popliteal fossa.  LEFT: - No cystic structure found in the popliteal fossa.  *See table(s) above for measurements and observations. Electronically signed by Deitra Mayo MD on 08/06/2020 at 6:26:48 PM.    Final     Micro Results  Recent Results (from the past 240 hour(s))  C Difficile Quick Screen w PCR reflex     Status: None    Collection Time: 08/04/20  5:49 PM   Specimen: STOOL  Result Value Ref Range Status   C Diff antigen NEGATIVE NEGATIVE Final   C Diff toxin NEGATIVE NEGATIVE Final   C Diff interpretation No C. difficile detected.  Final    Comment: Performed at [facility], [place] [address] [place], [place] [address]  MRSA PCR Screening     Status: None   Collection Time: 08/07/20  2:07 AM  Result Value Ref Range Status   MRSA by PCR NEGATIVE NEGATIVE Final    Comment:        The GeneXpert MRSA Assay (FDA approved for NASAL specimens only), is one component of a comprehensive MRSA colonization surveillance program. It is not intended to diagnose MRSA infection nor to guide or monitor treatment for MRSA infections. Performed at [facility], [place] [address], [place], [place] [address]        Today   Subjective:   Racquel Arkin today has no headache,no chest or abdominal pain, she does report generalized weakness and fatigue, cough has improved, dyspnea has significantly improved as well .  Objective:   Blood pressure 115/77, pulse 80, temperature 98.3 F (36.8 C), temperature source Oral, resp. rate 20, height 5\' 4"  (1.626 m), weight 115 kg, SpO2 (!) 89 %.  No intake or output data in the 24 hours ending 08/12/20 1302  Exam Awake Alert, Oriented x 3, No new F.N deficits, Normal affect Symmetrical Chest wall movement, Good air movement bilaterally, CTAB RRR,No Gallops,Rubs or new Murmurs, No Parasternal Heave +ve B.Sounds, Abd Soft, Non tender No Cyanosis, Clubbing or edema, No new Rash or bruise  Data Review   CBC w Diff:  Lab Results  Component Value Date   WBC 16.4 (H) 08/12/2020   HGB 11.8 (L) 08/12/2020   HCT 36.1 08/12/2020   PLT 277 08/12/2020   LYMPHOPCT 7 08/09/2020   BANDSPCT 0 10/22/2012   MONOPCT 6 08/09/2020   EOSPCT 0 08/09/2020   BASOPCT 0 08/09/2020    CMP:  Lab Results  Component Value Date   NA 133 (L) 08/12/2020   K 4.6 08/12/2020   CL  98 08/12/2020   CO2 26 08/12/2020   BUN 22 08/12/2020   CREATININE 0.72 08/12/2020   PROT 6.3 (L) 08/12/2020   ALBUMIN 2.8 (L) 08/12/2020   BILITOT 0.7 08/12/2020   ALKPHOS 72 08/12/2020   AST 17 08/12/2020   ALT 35 08/12/2020  .   Total Time in preparing paper work, data evaluation and todays exam - 6 minutes  Phillips Climes M.D on 08/12/2020 at 1:02 PM  [facility]   Office  [phone]

## 2020-08-12 NOTE — Progress Notes (Signed)
CSW faxed referral to [facility] as requested by patient's sister for follow up once home.   Claudia Dikes LCSW

## 2020-08-12 NOTE — Discharge Instructions (Signed)
Person Under Monitoring Name: Claudia [name]  Location: [place] [address]   Infection Prevention Recommendations for Individuals Confirmed to have, or Being Evaluated for, 2019 Novel Coronavirus (COVID-19) Infection Who Receive Care at Home  Individuals who are confirmed to have, or are being evaluated for, COVID-19 should follow the prevention steps below until a healthcare provider or local or state health department says they can return to normal activities.  Stay home except to get medical care You should restrict activities outside your home, except for getting medical care. Do not go to work, school, or public areas, and do not use public transportation or taxis.  Call ahead before visiting your doctor Before your medical appointment, call the healthcare provider and tell them that you have, or are being evaluated for, COVID-19 infection. This will help the healthcare provider's office take steps to keep other people from getting infected. Ask your healthcare provider to call the local or state health department.  Monitor your symptoms Seek prompt medical attention if your illness is worsening (e.g., difficulty breathing). Before going to your medical appointment, call the healthcare provider and tell them that you have, or are being evaluated for, COVID-19 infection. Ask your healthcare provider to call the local or state health department.  Wear a facemask You should wear a facemask that covers your nose and mouth when you are in the same room with other people and when you visit a healthcare provider. People who live with or visit you should also wear a facemask while they are in the same room with you.  Separate yourself from other people in your home As much as possible, you should stay in a different room from other people in your home. Also, you should use a separate bathroom, if available.  Avoid sharing household items You should not share dishes,  drinking glasses, cups, eating utensils, towels, bedding, or other items with other people in your home. After using these items, you should wash them thoroughly with soap and water.  Cover your coughs and sneezes Cover your mouth and nose with a tissue when you cough or sneeze, or you can cough or sneeze into your sleeve. Throw used tissues in a lined trash can, and immediately wash your hands with soap and water for at least 20 seconds or use an alcohol-based hand rub.  Wash your [facility] your hands often and thoroughly with soap and water for at least 20 seconds. You can use an alcohol-based hand sanitizer if soap and water are not available and if your hands are not visibly dirty. Avoid touching your eyes, nose, and mouth with unwashed hands.   Prevention Steps for Caregivers and Household Members of Individuals Confirmed to have, or Being Evaluated for, COVID-19 Infection Being Cared for in the Home  If you live with, or provide care at home for, a person confirmed to have, or being evaluated for, COVID-19 infection please follow these guidelines to prevent infection:  Follow healthcare provider's instructions Make sure that you understand and can help the patient follow any healthcare provider instructions for all care.  Provide for the patient's basic needs You should help the patient with basic needs in the home and provide support for getting groceries, prescriptions, and other personal needs.  Monitor the patient's symptoms If they are getting sicker, call his or her medical provider and tell them that the patient has, or is being evaluated for, COVID-19 infection. This will help the healthcare provider's office  take steps to keep other people from getting infected. Ask the healthcare provider to call the local or state health department.  Limit the number of people who have contact with the patient  If possible, have only one caregiver for the patient.  Other  household members should stay in another home or place of residence. If this is not possible, they should stay  in another room, or be separated from the patient as much as possible. Use a separate bathroom, if available.  Restrict visitors who do not have an essential need to be in the home.  Keep older adults, very young children, and other sick people away from the patient Keep older adults, very young children, and those who have compromised immune systems or chronic health conditions away from the patient. This includes people with chronic heart, lung, or kidney conditions, diabetes, and cancer.  Ensure good ventilation Make sure that shared spaces in the home have good air flow, such as from an air conditioner or an opened window, weather permitting.  Wash your hands often  Wash your hands often and thoroughly with soap and water for at least 20 seconds. You can use an alcohol based hand sanitizer if soap and water are not available and if your hands are not visibly dirty.  Avoid touching your eyes, nose, and mouth with unwashed hands.  Use disposable paper towels to dry your hands. If not available, use dedicated cloth towels and replace them when they become wet.  Wear a facemask and gloves  Wear a disposable facemask at all times in the room and gloves when you touch or have contact with the patient's blood, body fluids, and/or secretions or excretions, such as sweat, saliva, sputum, nasal mucus, vomit, urine, or feces.  Ensure the mask fits over your nose and mouth tightly, and do not touch it during use.  Throw out disposable facemasks and gloves after using them. Do not reuse.  Wash your hands immediately after removing your facemask and gloves.  If your personal clothing becomes contaminated, carefully remove clothing and launder. Wash your hands after handling contaminated clothing.  Place all used disposable facemasks, gloves, and other waste in a lined container before  disposing them with other household waste.  Remove gloves and wash your hands immediately after handling these items.  Do not share dishes, glasses, or other household items with the patient  Avoid sharing household items. You should not share dishes, drinking glasses, cups, eating utensils, towels, bedding, or other items with a patient who is confirmed to have, or being evaluated for, COVID-19 infection.  After the person uses these items, you should wash them thoroughly with soap and water.  Wash laundry thoroughly  Immediately remove and wash clothes or bedding that have blood, body fluids, and/or secretions or excretions, such as sweat, saliva, sputum, nasal mucus, vomit, urine, or feces, on them.  Wear gloves when handling laundry from the patient.  Read and follow directions on labels of laundry or clothing items and detergent. In general, wash and dry with the warmest temperatures recommended on the label.  Clean all areas the individual has used often  Clean all touchable surfaces, such as counters, tabletops, doorknobs, bathroom fixtures, toilets, phones, keyboards, tablets, and bedside tables, every day. Also, clean any surfaces that may have blood, body fluids, and/or secretions or excretions on them.  Wear gloves when cleaning surfaces the patient has come in contact with.  Use a diluted bleach solution (e.g., dilute bleach with 1 part  bleach and 10 parts water) or a household disinfectant with a label that says EPA-registered for coronaviruses. To make a bleach solution at home, add 1 tablespoon of bleach to 1 quart (4 cups) of water. For a larger supply, add  cup of bleach to 1 gallon (16 cups) of water.  Read labels of cleaning products and follow recommendations provided on product labels. Labels contain instructions for safe and effective use of the cleaning product including precautions you should take when applying the product, such as wearing gloves or eye protection  and making sure you have good ventilation during use of the product.  Remove gloves and wash hands immediately after cleaning.  Monitor yourself for signs and symptoms of illness Caregivers and household members are considered close contacts, should monitor their health, and will be asked to limit movement outside of the home to the extent possible. Follow the monitoring steps for close contacts listed on the symptom monitoring form.   ? If you have additional questions, contact your local health department or call the epidemiologist on call at [phone] (available 24/7). ? This guidance is subject to change. For the most up-to-date guidance from [facility], please refer to their website: [email or web address]

## 2020-08-12 NOTE — Progress Notes (Signed)
SATURATION QUALIFICATIONS: (This note is used to comply with regulatory documentation for home oxygen)  Patient Saturations on Room Air at Rest = 85%   Please briefly explain why patient needs home oxygen:  Patient requires 2L of oxygen to maintain oxygen saturation of 91-92% at rest.

## 2020-09-04 NOTE — Addendum Note (Signed)
Encounter addended by: Paul Dykes, RN on: 09/04/2020 6:20 PM  Actions taken: Charge Capture section accepted

## 2020-09-04 NOTE — Addendum Note (Signed)
Encounter addended by: Paul Dykes, RN on: 09/04/2020 6:27 PM  Actions taken: Charge Capture section accepted

## 2021-07-10 ENCOUNTER — Emergency Department (HOSPITAL_COMMUNITY)
Admission: EM | Admit: 2021-07-10 | Discharge: 2021-07-10 | Disposition: A | Discharge status: Home / Self Care | Payer: Medicare Other | Attending: Emergency Medicine | Admitting: Emergency Medicine

## 2021-07-10 ENCOUNTER — Emergency Department (HOSPITAL_COMMUNITY): Payer: Medicare Other

## 2021-07-10 ENCOUNTER — Encounter (HOSPITAL_COMMUNITY): Payer: Self-pay

## 2021-07-10 DIAGNOSIS — Z79899 Other long term (current) drug therapy: Secondary | ICD-10-CM | POA: Diagnosis not present

## 2021-07-10 DIAGNOSIS — Z96662 Presence of left artificial ankle joint: Secondary | ICD-10-CM | POA: Diagnosis not present

## 2021-07-10 DIAGNOSIS — U071 COVID-19: Secondary | ICD-10-CM | POA: Insufficient documentation

## 2021-07-10 DIAGNOSIS — I10 Essential (primary) hypertension: Secondary | ICD-10-CM | POA: Diagnosis not present

## 2021-07-10 DIAGNOSIS — Z87891 Personal history of nicotine dependence: Secondary | ICD-10-CM | POA: Diagnosis not present

## 2021-07-10 DIAGNOSIS — Z8616 Personal history of COVID-19: Secondary | ICD-10-CM | POA: Diagnosis not present

## 2021-07-10 DIAGNOSIS — Z853 Personal history of malignant neoplasm of breast: Secondary | ICD-10-CM | POA: Insufficient documentation

## 2021-07-10 DIAGNOSIS — Z96652 Presence of left artificial knee joint: Secondary | ICD-10-CM | POA: Insufficient documentation

## 2021-07-10 DIAGNOSIS — R059 Cough, unspecified: Secondary | ICD-10-CM | POA: Diagnosis present

## 2021-07-10 LAB — COMPREHENSIVE METABOLIC PANEL
ALT: 25 U/L (ref 0–44)
AST: 25 U/L (ref 15–41)
Albumin: 3.5 g/dL (ref 3.5–5.0)
Alkaline Phosphatase: 94 U/L (ref 38–126)
Anion gap: 8 (ref 5–15)
BUN: 10 mg/dL (ref 8–23)
CO2: 26 mmol/L (ref 22–32)
Calcium: 9.2 mg/dL (ref 8.9–10.3)
Chloride: 104 mmol/L (ref 98–111)
Creatinine, Ser: 1.07 mg/dL — ABNORMAL HIGH (ref 0.44–1.00)
GFR, Estimated: 58 mL/min — ABNORMAL LOW (ref >60)
Glucose, Bld: 100 mg/dL — ABNORMAL HIGH (ref 70–99)
Potassium: 3.9 mmol/L (ref 3.5–5.1)
Sodium: 138 mmol/L (ref 135–145)
Total Bilirubin: 0.5 mg/dL (ref 0.3–1.2)
Total Protein: 7.4 g/dL (ref 6.5–8.1)

## 2021-07-10 LAB — CBC WITH DIFFERENTIAL/PLATELET
Abs Immature Granulocytes: 0.03 10*3/uL (ref 0.00–0.07)
Basophils Absolute: 0.1 10*3/uL (ref 0.0–0.1)
Basophils Relative: 1 %
Eosinophils Absolute: 0.2 10*3/uL (ref 0.0–0.5)
Eosinophils Relative: 2 %
HCT: 42.8 % (ref 36.0–46.0)
Hemoglobin: 13 g/dL (ref 12.0–15.0)
Immature Granulocytes: 0 %
Lymphocytes Relative: 25 %
Lymphs Abs: 2 10*3/uL (ref 0.7–4.0)
MCH: 27.3 pg (ref 26.0–34.0)
MCHC: 30.4 g/dL (ref 30.0–36.0)
MCV: 89.7 fL (ref 80.0–100.0)
Monocytes Absolute: 0.7 10*3/uL (ref 0.1–1.0)
Monocytes Relative: 8 %
Neutro Abs: 5.1 10*3/uL (ref 1.7–7.7)
Neutrophils Relative %: 64 %
Platelets: 215 10*3/uL (ref 150–400)
RBC: 4.77 MIL/uL (ref 3.87–5.11)
RDW: 14.4 % (ref 11.5–15.5)
WBC: 8 10*3/uL (ref 4.0–10.5)
nRBC: 0 % (ref 0.0–0.2)

## 2021-07-10 LAB — RESP PANEL BY RT-PCR (FLU A&B, COVID) ARPGX2
Influenza A by PCR: NEGATIVE
Influenza B by PCR: NEGATIVE
SARS Coronavirus 2 by RT PCR: POSITIVE — AB

## 2021-07-10 MED ORDER — SODIUM CHLORIDE 0.9 % IV BOLUS: 1000.0000 mL | Freq: Once | INTRAVENOUS | Status: DC

## 2021-07-10 MED ORDER — NIRMATRELVIR/RITONAVIR (PAXLOVID)TABLET
2.0000 | ORAL_TABLET | Freq: Two times a day (BID) | ORAL | 0 refills | Status: AC
Start: 2021-07-10 — End: 2021-07-15

## 2021-07-10 MED ORDER — ONDANSETRON HCL 4 MG PO TABS: 4.0000 mg | ORAL_TABLET | Freq: Four times a day (QID) | ORAL | 0 refills | Status: DC

## 2021-07-10 MED ORDER — NIRMATRELVIR/RITONAVIR (PAXLOVID)TABLET: 2.0000 | ORAL_TABLET | Freq: Two times a day (BID) | ORAL | 0 refills | Status: DC

## 2021-07-10 NOTE — ED Provider Notes (Signed)
Emergency Medicine Provider Triage Evaluation Note  Claudia [name] , a 65 y.o. female  was evaluated in triage.  Pt complains of shortness of breath.  She states that she had to positive COVID POC test at home.  Her symptoms started 2 days ago.  She did not get any COVID vaccines. She is currently a cancer patient with metastatic breast cancer, she states she called her oncologist at [facility] and they recommended that she come here.  She denies any fevers.  She states she is currently undergoing immunotherapy not chemotherapy.  She states her oxygen is usually in the lower 90s.   Review of Systems  Positive:  Negative: See above  Physical Exam  BP (!) 133/95 (BP Location: Left Arm)    Pulse 94    Temp 99 F (37.2 C) (Oral)    Resp 19    Ht 5\' 4"  (1.626 m)    Wt 111.6 kg    SpO2 98%    BMI 42.23 kg/m  Gen:   Awake, no distress   Resp:  Normal effort  MSK:   Moves extremities without difficulty  Other:  Occasional cough  Medical Decision Making  Medically screening exam initiated at 7:52 PM.  Appropriate orders placed.  HAZLEY DEZEEUW was informed that the remainder of the evaluation will be completed by another provider, this initial triage assessment does not replace that evaluation, and the importance of remaining in the ED until their evaluation is complete.  CING Timblin was evaluated in Emergency Department on 07/10/2021 for the symptoms described in the history of present illness. She was evaluated in the context of the global COVID-19 pandemic, which necessitated consideration that the patient might be at risk for infection with the SARS-CoV-2 virus that causes COVID-19. Institutional protocols and algorithms that pertain to the evaluation of patients at risk for COVID-19 are in a state of rapid change based on information released by regulatory bodies including the CDC and federal and state organizations. These policies and algorithms were followed during the patient's care in the  ED.  Note: Portions of this report may have been transcribed using voice recognition software. Every effort was made to ensure accuracy; however, inadvertent computerized transcription errors may be present    Ollen Gross 07/10/21 1953    Wyvonnia Dusky, MD 07/10/21 [id number]

## 2021-07-10 NOTE — ED Provider Notes (Signed)
[facility] EMERGENCY DEPARTMENT Provider Note   CSN: [id number] Arrival date & time: 07/10/21  1913     History Chief Complaint  Patient presents with   Shortness of Breath    Claudia [name] is a 65 y.o. female.  The history is provided by the patient and medical records.  Cough Cough characteristics:  Non-productive Sputum characteristics:  Nondescript Severity:  Moderate Onset quality:  Gradual Duration:  2 days Timing:  Constant Progression:  Worsening Chronicity:  New Smoker: no   Context: upper respiratory infection   Relieved by:  Nothing Worsened by:  Nothing Ineffective treatments:  None tried Associated symptoms: chills, rhinorrhea and sinus congestion   Associated symptoms: no chest pain, no ear pain, no fever, no rash, no shortness of breath and no sore throat       Past Medical History:  Diagnosis Date   Anxiety    Breast cancer ([place])    Cancer of right breast ([place])    mastectomy   Depression    GERD (gastroesophageal reflux disease)    Hiatal hernia    Hypercholesterolemia    Hypertension    Irritable bowel    PONV (postoperative nausea and vomiting)     Patient Active Problem List   Diagnosis Date Noted   Acute hypoxemic respiratory failure due to COVID-19 ([place]) 08/04/2020   Hypokalemia 08/04/2020   Hypoalbuminemia 08/04/2020   Acute respiratory failure with hypoxia ([place]) 07/31/2020   Pneumonia due to COVID-19 virus 07/31/2020   AKI (acute kidney injury) ([place]) 07/31/2020   Obesity, Class III, BMI 40-49.9 (morbid obesity) ([place]) 07/31/2020   Presence of left artificial knee joint 09/30/2017   Arthritis of left knee 09/20/2017   Closed fracture of medial malleolus of left ankle with delayed healing 05/29/2014   Closed trimalleolar fracture of left ankle 10/10/2013   Thyroid nodule 10/23/2012   Ataxia 10/22/2012   Vertigo 10/22/2012   History of breast cancer 10/22/2012   Panic disorder 10/22/2012   Depression  08/31/2012   Anxiety 08/31/2012    Past Surgical History:  Procedure Laterality Date   ABDOMINAL HYSTERECTOMY     BREAST SURGERY     lumpectomy   CHOLECYSTECTOMY     FOOT SURGERY     bilateral spur removal   HARDWARE REMOVAL Left 05/29/2014   Procedure: REMOVAL OF SCREW AND K-WIRE;  Surgeon: Sanjuana Kava, MD;  Location: [facility];  Service: Orthopedics;  Laterality: Left;   JOINT REPLACEMENT Left 2017   Ankle   MASTECTOMY     ORIF ANKLE FRACTURE Left 10/10/2013   Procedure: OPEN REDUCTION INTERNAL FIXATION (ORIF) ANKLE FRACTURE;  Surgeon: Sanjuana Kava, MD;  Location: [facility];  Service: Orthopedics;  Laterality: Left;   ORIF ANKLE FRACTURE Left 05/29/2014   Procedure: OPEN REDUCTION INTERNAL FIXATION (ORIF) ANKLE FRACTURE AND PLACEMENT OF BONE GRAFT MATERIAL;  Surgeon: Sanjuana Kava, MD;  Location: [facility];  Service: Orthopedics;  Laterality: Left;   TONSILLECTOMY     TOTAL ABDOMINAL HYSTERECTOMY W/ BILATERAL SALPINGOOPHORECTOMY     TOTAL KNEE ARTHROPLASTY Left 09/20/2017   Procedure: LEFT TOTAL KNEE ARTHROPLASTY;  Surgeon: Marybelle Killings, MD;  Location: [facility];  Service: Orthopedics;  Laterality: Left;   TUBAL LIGATION       OB History   No obstetric history on file.     Family History  Problem Relation Age of Onset   Liver disease Mother    Hypertension Father    Heart attack Sister     Social  History   Tobacco Use   Smoking status: Former    Packs/day: 0.25    Years: 40.00    Pack years: 10.00    Types: Cigarettes    Quit date: 07/23/2018    Years since quitting: 2.9   Smokeless tobacco: Never  Vaping Use   Vaping Use: Former  Substance Use Topics   Alcohol use: No   Drug use: No    Home Medications Prior to Admission medications   Medication Sig Start Date End Date Taking? Authorizing Provider  albuterol (VENTOLIN HFA) 108 (90 Base) MCG/ACT inhaler Inhale 2 puffs into the lungs 3 (three) times daily. 08/01/20   Kathie Dike, MD  alprazolam Duanne Moron) 2 MG tablet  Take 2 mg by mouth 4 (four) times daily.    [provider]  amLODipine-benazepril (LOTREL) 10-20 MG capsule Take 1 capsule by mouth daily. Start in 3 days 08/15/20   Elgergawy, Silver Huguenin, MD  anastrozole (ARIMIDEX) 1 MG tablet Take 1 tablet by mouth daily. 07/17/20   [provider]  cetirizine (ZYRTEC) 10 MG tablet Take 10 mg by mouth daily.    [provider]  ezetimibe (ZETIA) 10 MG tablet Take 1 tablet (10 mg total) by mouth daily. 09/02/12   Patrecia Pour, NP  gabapentin (NEURONTIN) 100 MG capsule Take 200 mg by mouth 3 (three) times daily as needed (burning/tingling). 05/07/20   [provider]  guaiFENesin-dextromethorphan (ROBITUSSIN DM) 100-10 MG/5ML syrup Take 10 mLs by mouth every 4 (four) hours as needed for cough. 08/01/20   Kathie Dike, MD  HYDROcodone-acetaminophen (NORCO) 5-325 MG tablet Take 1 tablet by mouth at bedtime as needed for moderate pain. 10/14/17   Marybelle Killings, MD  nirmatrelvir/ritonavir EUA (PAXLOVID) 20 x 150 MG & 10 x 100MG  TABS Take 2 tablets by mouth 2 (two) times daily for 5 days. Patient GFR is 58. Take 150 mg nirmatrelvir (one 150 mg tablet) with 100 mg ritonavir (one 100 mg tablet), with both tablets taken together twice daily for 5 days. 07/10/21 07/15/21  Idamae Lusher, MD  sertraline (ZOLOFT) 100 MG tablet Take 250 mg by mouth daily.  09/02/12   Patrecia Pour, NP    Allergies    Statins  Review of Systems   Review of Systems  Constitutional:  Positive for chills and fatigue. Negative for fever.  HENT:  Positive for congestion and rhinorrhea. Negative for ear pain and sore throat.   Eyes:  Negative for pain and visual disturbance.  Respiratory:  Positive for cough. Negative for shortness of breath.   Cardiovascular:  Negative for chest pain and palpitations.  Gastrointestinal:  Positive for nausea. Negative for abdominal pain and vomiting.  Genitourinary:  Negative for dysuria and hematuria.  Musculoskeletal:   Negative for arthralgias and back pain.  Skin:  Negative for color change and rash.  Neurological:  Negative for seizures and syncope.  All other systems reviewed and are negative.  Physical Exam Updated Vital Signs BP 119/72 (BP Location: Right Arm)    Pulse 83    Temp 98.9 F (37.2 C) (Oral)    Resp 20    Ht 5\' 4"  (1.626 m)    Wt 111.6 kg    SpO2 94%    BMI 42.23 kg/m   Physical Exam Vitals and nursing note reviewed.  Constitutional:      General: She is not in acute distress.    Appearance: Normal appearance. She is well-developed.  HENT:     Head: Normocephalic  and atraumatic.     Right Ear: External ear normal.     Left Ear: External ear normal.     Nose: Nose normal. No congestion or rhinorrhea.     Mouth/Throat:     Mouth: Mucous membranes are moist.  Eyes:     Extraocular Movements: Extraocular movements intact.     Conjunctiva/sclera: Conjunctivae normal.     Pupils: Pupils are equal, round, and reactive to light.  Cardiovascular:     Rate and Rhythm: Normal rate and regular rhythm.     Pulses: Normal pulses.     Heart sounds: No murmur heard. Pulmonary:     Effort: Pulmonary effort is normal. No respiratory distress.     Breath sounds: Normal breath sounds. No wheezing, rhonchi or rales.  Abdominal:     General: Abdomen is flat. Bowel sounds are normal.     Palpations: Abdomen is soft.     Tenderness: There is no abdominal tenderness. There is no guarding or rebound.  Musculoskeletal:        General: No swelling, tenderness or deformity.     Cervical back: Normal range of motion and neck supple. No rigidity.  Skin:    General: Skin is warm and dry.     Capillary Refill: Capillary refill takes less than 2 seconds.  Neurological:     General: No focal deficit present.     Mental Status: She is alert and oriented to person, place, and time.  Psychiatric:        Mood and Affect: Mood normal.    ED Results / Procedures / Treatments   Labs (all labs ordered  are listed, but only abnormal results are displayed) Labs Reviewed  RESP PANEL BY RT-PCR (FLU A&B, COVID) ARPGX2 - Abnormal; Notable for the following components:      Result Value   SARS Coronavirus 2 by RT PCR POSITIVE (*)    All other components within normal limits  COMPREHENSIVE METABOLIC PANEL - Abnormal; Notable for the following components:   Glucose, Bld 100 (*)    Creatinine, Ser 1.07 (*)    GFR, Estimated 58 (*)    All other components within normal limits  CBC WITH DIFFERENTIAL/PLATELET    EKG EKG Interpretation  Date/Time:  Thursday July 10 2021 19:44:46 EST Ventricular Rate:  89 PR Interval:  158 QRS Duration: 82 QT Interval:  376 QTC Calculation: 457 R Axis:   -20 Text Interpretation: Normal sinus rhythm Nonspecific T wave abnormality Abnormal ECG No significant change since last tracing Confirmed by Calvert Cantor [id number]) on 07/10/2021 9:38:37 PM  Radiology DG Chest 2 View  Result Date: 07/10/2021 CLINICAL DATA:  Short of breath, COVID positive, history of metastatic breast cancer EXAM: CHEST - 2 VIEW COMPARISON:  08/05/2020 FINDINGS: Frontal and lateral views of the chest demonstrate a stable chest wall port. Cardiac silhouette is unremarkable. No acute airspace disease, effusion, or pneumothorax. Resolution of the bibasilar consolidation seen previously, with likely resulting scarring. No acute bony abnormalities. IMPRESSION: 1. No acute intrathoracic process. 2. Likely postinflammatory scarring at the lung bases. Electronically Signed   By: Randa Ngo M.D.   On: 07/10/2021 20:46    Procedures Procedures   Medications Ordered in ED Medications  sodium chloride 0.9 % bolus 1,000 mL (has no administration in time range)    ED Course  I have reviewed the triage vital signs and the nursing notes.  Pertinent labs & imaging results that were available during my care of the patient were  reviewed by me and considered in my medical decision making (see  chart for details).    MDM Rules/Calculators/A&P                          64 y/o female w/ PMHx sig for breast cancer presenting w/ cough, congestion, runny nose. Afebrile, vitally stable here. COVID positive. No signs of respiratory distress. No o2 requirement. Patient able to stand and ambulate w/o desaturations. EKG showed sinus rhythm w/ nonspecific T wave changes.  Chest x-ray showed no focal infiltrates.  No pneumothorax.  She is not having any chest pain.  Low concern for ACS or PE.  Patient has no indications for admission at this time.  Appropriate for discharge home with symptomatic management.  Patient is eligible for oral treatment Paxlovid.  Medications reviewed.  No significant drug interactions noted.  Encouraged avoidance of Xanax while taking the oral treatment.  Patient to follow-up with PCP for reassessment.  Strict return ED precautions provided.   Final Clinical Impression(s) / ED Diagnoses Final diagnoses:  COVID    Rx / DC Orders ED Discharge Orders          Ordered    nirmatrelvir/ritonavir EUA (PAXLOVID) 20 x 150 MG & 10 x 100MG  TABS  2 times daily,   Status:  Discontinued        07/10/21 2250    nirmatrelvir/ritonavir EUA (PAXLOVID) 20 x 150 MG & 10 x 100MG  TABS  2 times daily        07/10/21 2250             Idamae Lusher, MD 07/10/21 2257    Truddie Hidden, MD 07/10/21 [phone]

## 2021-07-10 NOTE — ED Triage Notes (Signed)
Pt arrives POV for eval of SOB and Covid +. Pt is currently a CA pt w/ metastatic breast CA w/ mets to lung. Admitted in January for Covid for 11 days. Reports her oncologist advised she present here for treatment. Dyspnea on exertion per pt.

## 2021-07-10 NOTE — ED Notes (Signed)
Pt ambulated with pulse ox well. O2 sat stayed within normal limits. Pt has no complaints.

## 2021-12-07 DIAGNOSIS — Z853 Personal history of malignant neoplasm of breast: Secondary | ICD-10-CM | POA: Diagnosis not present

## 2021-12-07 DIAGNOSIS — I10 Essential (primary) hypertension: Secondary | ICD-10-CM | POA: Insufficient documentation

## 2021-12-07 DIAGNOSIS — Z79899 Other long term (current) drug therapy: Secondary | ICD-10-CM | POA: Diagnosis not present

## 2021-12-07 DIAGNOSIS — R531 Weakness: Secondary | ICD-10-CM | POA: Diagnosis present

## 2021-12-08 ENCOUNTER — Emergency Department (HOSPITAL_COMMUNITY)
Admission: EM | Admit: 2021-12-08 | Discharge: 2021-12-08 | Disposition: A | Discharge status: Home / Self Care | Payer: Medicare (Managed Care) | Attending: Emergency Medicine | Admitting: Emergency Medicine

## 2021-12-08 ENCOUNTER — Encounter (HOSPITAL_COMMUNITY): Payer: Self-pay

## 2021-12-08 ENCOUNTER — Emergency Department (HOSPITAL_COMMUNITY): Payer: Medicare (Managed Care)

## 2021-12-08 DIAGNOSIS — I10 Essential (primary) hypertension: Secondary | ICD-10-CM

## 2021-12-08 DIAGNOSIS — R531 Weakness: Secondary | ICD-10-CM

## 2021-12-08 LAB — BASIC METABOLIC PANEL
Anion gap: 5 (ref 5–15)
BUN: 16 mg/dL (ref 8–23)
CO2: 25 mmol/L (ref 22–32)
Calcium: 8.9 mg/dL (ref 8.9–10.3)
Chloride: 108 mmol/L (ref 98–111)
Creatinine, Ser: 0.67 mg/dL (ref 0.44–1.00)
GFR, Estimated: >60 mL/min (ref >60)
Glucose, Bld: 93 mg/dL (ref 70–99)
Potassium: 4 mmol/L (ref 3.5–5.1)
Sodium: 138 mmol/L (ref 135–145)

## 2021-12-08 LAB — CBC WITH DIFFERENTIAL/PLATELET
Abs Immature Granulocytes: 0.04 10*3/uL (ref 0.00–0.07)
Basophils Absolute: 0 10*3/uL (ref 0.0–0.1)
Basophils Relative: 0 %
Eosinophils Absolute: 0.2 10*3/uL (ref 0.0–0.5)
Eosinophils Relative: 2 %
HCT: 37.9 % (ref 36.0–46.0)
Hemoglobin: 11.8 g/dL — ABNORMAL LOW (ref 12.0–15.0)
Immature Granulocytes: 0 %
Lymphocytes Relative: 34 %
Lymphs Abs: 3.1 10*3/uL (ref 0.7–4.0)
MCH: 27.9 pg (ref 26.0–34.0)
MCHC: 31.1 g/dL (ref 30.0–36.0)
MCV: 89.6 fL (ref 80.0–100.0)
Monocytes Absolute: 0.7 10*3/uL (ref 0.1–1.0)
Monocytes Relative: 7 %
Neutro Abs: 5.2 10*3/uL (ref 1.7–7.7)
Neutrophils Relative %: 57 %
Platelets: 220 10*3/uL (ref 150–400)
RBC: 4.23 MIL/uL (ref 3.87–5.11)
RDW: 14.6 % (ref 11.5–15.5)
WBC: 9.2 10*3/uL (ref 4.0–10.5)
nRBC: 0 % (ref 0.0–0.2)

## 2021-12-08 LAB — TROPONIN I (HIGH SENSITIVITY): Troponin I (High Sensitivity): 4 ng/L (ref <18)

## 2021-12-08 NOTE — ED Provider Notes (Signed)
?[facility] ?Provider Note ? ? ?CSN: [id number] ?Arrival date & time: 12/07/21  2339 ? ?  ? ?History ? ?Chief Complaint  ?Patient presents with  ? Weakness  ? Hypertension  ? ? ?Claudia [name] is a 66 y.o. female. ? ?The history is provided by the patient.  ?Weakness ?Severity:  Moderate ?Onset quality:  Gradual ?Chronicity:  New ?Relieved by:  Nothing ?Associated symptoms: no abdominal pain, no chest pain, no difficulty walking, no fever, no headaches, no loss of consciousness, no shortness of breath, no stroke symptoms and no vomiting   ?Hypertension ?Pertinent negatives include no chest pain, no abdominal pain, no headaches and no shortness of breath.  ?Patient with history of breast cancer, hypertension presents with fatigue.  She reports over the past 2 days she has had generalized weakness and fatigue.  No focal weakness.  No slurred speech. ?She reports she has been checking her blood pressure and it has been elevated into the 170s which is unusual.  She reports med compliance.  She reports that she is on cardiotoxic chemotherapy and keeps a close check on her blood pressure.  She consulted her [facility] cardiologist who recommended an additional Coreg dose if her blood pressure was elevated ?She reports she can ambulate at her baseline ?Past Medical History:  ?Diagnosis Date  ? Anxiety   ? Breast cancer ([place])   ? Cancer of right breast ([place])   ? mastectomy  ? Depression   ? GERD (gastroesophageal reflux disease)   ? Hiatal hernia   ? Hypercholesterolemia   ? Hypertension   ? Irritable bowel   ? PONV (postoperative nausea and vomiting)   ? ? ?Home Medications ?Prior to Admission medications   ?Medication Sig Start Date End Date Taking? Authorizing Provider  ?albuterol (VENTOLIN HFA) 108 (90 Base) MCG/ACT inhaler Inhale 2 puffs into the lungs 3 (three) times daily. 08/01/20   Kathie Dike, MD  ?alprazolam Duanne Moron) 2 MG tablet Take 2 mg by mouth 4 (four) times daily.    [provider]   ?amLODipine-benazepril (LOTREL) 10-20 MG capsule Take 1 capsule by mouth daily. Start in 3 days 08/15/20   Elgergawy, Silver Huguenin, MD  ?anastrozole (ARIMIDEX) 1 MG tablet Take 1 tablet by mouth daily. 07/17/20   [provider]  ?cetirizine (ZYRTEC) 10 MG tablet Take 10 mg by mouth daily.    [provider]  ?ezetimibe (ZETIA) 10 MG tablet Take 1 tablet (10 mg total) by mouth daily. 09/02/12   Patrecia Pour, NP  ?gabapentin (NEURONTIN) 100 MG capsule Take 200 mg by mouth 3 (three) times daily as needed (burning/tingling). 05/07/20   [provider]  ?guaiFENesin-dextromethorphan (ROBITUSSIN DM) 100-10 MG/5ML syrup Take 10 mLs by mouth every 4 (four) hours as needed for cough. 08/01/20   Kathie Dike, MD  ?HYDROcodone-acetaminophen (NORCO) 5-325 MG tablet Take 1 tablet by mouth at bedtime as needed for moderate pain. 10/14/17   Marybelle Killings, MD  ?ondansetron (ZOFRAN) 4 MG tablet Take 1 tablet (4 mg total) by mouth every 6 (six) hours. 07/10/21   Idamae Lusher, MD  ?sertraline (ZOLOFT) 100 MG tablet Take 250 mg by mouth daily.  09/02/12   Patrecia Pour, NP  ?   ? ?Allergies    ?Statins   ? ?Review of Systems   ?Review of Systems  ?Constitutional:  Positive for fatigue. Negative for fever.  ?Respiratory:  Negative for shortness of breath.   ?Cardiovascular:  Negative for chest pain.  ?Gastrointestinal:  Negative for abdominal pain and vomiting.  ?Neurological:  Positive for weakness. Negative for loss of consciousness, syncope, speech difficulty and headaches.  ? ?Physical Exam ?Updated Vital Signs ?BP 121/76   Pulse 69   Temp 97.6 ?F (36.4 ?C) (Oral)   Resp (!) 26   Ht 1.626 m ('5\' 4"'$ )   Wt 108.9 kg   SpO2 95%   BMI 41.20 kg/m?  ?Physical Exam ?CONSTITUTIONAL: Chronically ill-appearing, no acute distress ?HEAD: Normocephalic/atraumatic ?EYES: EOMI/PERRL ?ENMT: Mucous membranes moist ?NECK: supple no meningeal signs ?CV: S1/S2 noted, no murmurs/rubs/gallops noted ?LUNGS: Lungs are  clear to auscultation bilaterally, no apparent distress ?ABDOMEN: soft, nontender, no rebound or guarding ?GU:no cva tenderness ?NEURO:Awake/alert, face symmetric, no arm or leg drift is noted ?Equal 5/5 strength with shoulder abduction, elbow flex/extension, wrist flex/extension in upper extremities and equal hand grips bilaterally ?Equal 5/5 strength with hip flexion,knee flex/extension, foot dorsi/plantar flexion ?Cranial nerves 3/4/5/6/02/01/09/11/12 tested and intact ?No past pointing ?Sensation to light touch intact in all extremities ?EXTREMITIES: pulses normal, full ROM ?SKIN: warm, color normal ?PSYCH: no abnormalities of mood noted ? ?ED Results / Procedures / Treatments   ?Labs ?(all labs ordered are listed, but only abnormal results are displayed) ?Labs Reviewed  ?CBC WITH DIFFERENTIAL/PLATELET - Abnormal; Notable for the following components:  ?    Result Value  ? Hemoglobin 11.8 (*)   ? All other components within normal limits  ?BASIC METABOLIC PANEL  ?TROPONIN I (HIGH SENSITIVITY)  ? ? ?EKG ?EKG Interpretation ? ?Date/Time:  Monday Dec 08 2021 00:31:31 EDT ?Ventricular Rate:  68 ?PR Interval:  186 ?QRS Duration: 87 ?QT Interval:  414 ?QTC Calculation: 441 ?R Axis:   60 ?Text Interpretation: Sinus rhythm Low voltage, precordial leads No significant change since last tracing Confirmed by Ripley Fraise [phone]) on 12/08/2021 12:59:32 AM ? ?Radiology ?DG Chest Port 1 View ? ?Result Date: 12/08/2021 ?CLINICAL DATA:  Weakness EXAM: PORTABLE CHEST 1 VIEW COMPARISON:  07/10/2021 FINDINGS: Eventration of the right hemidiaphragm. Associated right lower lobe atelectasis. Left lung is clear. No pleural effusion or pneumothorax. The heart is normal in size. Left chest power port terminating at the cavoatrial junction. Thoracic aortic atherosclerosis. Postsurgical changes along the right lateral chest wall/breast. IMPRESSION: No evidence of acute cardiopulmonary disease. Electronically Signed   By: Julian Hy  M.D.   On: 12/08/2021 01:04   ? ?Procedures ?Procedures  ? ? ?Medications Ordered in ED ?Medications - No data to display ? ?ED Course/ Medical Decision Making/ A&P ?  ?                        ?Medical Decision Making ?Problems Addressed: ?Primary hypertension: chronic illness or injury with exacerbation, progression, or side effects of treatment ?Weakness: acute illness or injury with systemic symptoms ? ?Amount and/or Complexity of Data Reviewed ?Independent Historian: caregiver ?   Details: Grandchild ?External Data Reviewed: labs, radiology and ECG. ?Labs: ordered. Decision-making details documented in ED Course. ?Radiology: ordered and independent interpretation performed. Decision-making details documented in ED Course. ?ECG/medicine tests: ordered and independent interpretation performed. Decision-making details documented in ED Course. ? ?Risk ?Prescription drug management. ? ? ?Patient presented with generalized fatigue and elevated blood pressure.  Differential includes CVA, acute coronary syndrome, hypertensive emergency. ?Patient is overall well-appearing.  I checked blood pressure in both arms and they are reassuring.  No focal weakness to suggest stroke. ?Overall work-up in the emergency room has been unremarkable. ?Patient is safe for discharge home.  She will follow-up with her cardiologist ? ? ? ? ? ? ? ?Final Clinical Impression(s) / ED Diagnoses ?Final diagnoses:  ?Primary hypertension  ?Weakness  ? ? ?Rx / DC Orders ?ED Discharge Orders   ? ? None  ? ?  ? ? ?  ?Ripley Fraise, MD ?12/08/21 0304 ? ?

## 2021-12-08 NOTE — ED Triage Notes (Signed)
Pt came in POV with c/o worsening weakness over the last two days. Pt states that she has checked her B/P and it has been high at home with systolic 595Z. Her doctor told her to double her carvedilol. Current B/P is 125/76. HR 70s. Pt A+O times four. She takes infusions for breast cancer that are cardiotoxic ?

## 2021-12-08 NOTE — Discharge Instructions (Signed)

## 2022-02-16 ENCOUNTER — Emergency Department (HOSPITAL_COMMUNITY): Payer: Medicare (Managed Care)

## 2022-02-16 ENCOUNTER — Emergency Department (HOSPITAL_COMMUNITY)
Admission: EM | Admit: 2022-02-16 | Discharge: 2022-02-16 | Disposition: A | Discharge status: Home / Self Care | Payer: Medicare (Managed Care) | Attending: Emergency Medicine | Admitting: Emergency Medicine

## 2022-02-16 ENCOUNTER — Other Ambulatory Visit: Payer: Self-pay

## 2022-02-16 ENCOUNTER — Encounter (HOSPITAL_COMMUNITY): Payer: Self-pay

## 2022-02-16 DIAGNOSIS — I10 Essential (primary) hypertension: Secondary | ICD-10-CM | POA: Diagnosis not present

## 2022-02-16 DIAGNOSIS — Z853 Personal history of malignant neoplasm of breast: Secondary | ICD-10-CM | POA: Insufficient documentation

## 2022-02-16 DIAGNOSIS — Z96652 Presence of left artificial knee joint: Secondary | ICD-10-CM | POA: Insufficient documentation

## 2022-02-16 DIAGNOSIS — Z87891 Personal history of nicotine dependence: Secondary | ICD-10-CM | POA: Insufficient documentation

## 2022-02-16 DIAGNOSIS — R059 Cough, unspecified: Secondary | ICD-10-CM | POA: Diagnosis present

## 2022-02-16 MED ORDER — AMOXICILLIN 250 MG PO CAPS
500.0000 mg | ORAL_CAPSULE | Freq: Once | ORAL | Status: AC
  Administered 2022-02-16: 500 mg via ORAL
  Filled 2022-02-16: qty 2

## 2022-02-16 MED ORDER — AZITHROMYCIN 250 MG PO TABS: 250.0000 mg | ORAL_TABLET | Freq: Every day | ORAL | 0 refills | Status: AC

## 2022-02-16 MED ORDER — AZITHROMYCIN 250 MG PO TABS
500.0000 mg | ORAL_TABLET | Freq: Once | ORAL | Status: AC
  Administered 2022-02-16: 500 mg via ORAL
  Filled 2022-02-16: qty 2

## 2022-02-16 MED ORDER — AMOXICILLIN 500 MG PO CAPS: 500.0000 mg | ORAL_CAPSULE | Freq: Three times a day (TID) | ORAL | 0 refills | Status: AC

## 2022-02-16 MED ORDER — ALBUTEROL SULFATE HFA 108 (90 BASE) MCG/ACT IN AERS
2.0000 | INHALATION_SPRAY | Freq: Once | RESPIRATORY_TRACT | Status: AC
  Filled 2022-02-16: qty 6.7

## 2022-02-16 MED ORDER — ALBUTEROL SULFATE HFA 108 (90 BASE) MCG/ACT IN AERS
INHALATION_SPRAY | RESPIRATORY_TRACT | Status: AC
  Administered 2022-02-16: 2 via RESPIRATORY_TRACT
  Filled 2022-02-16: qty 6.7

## 2022-02-16 NOTE — ED Triage Notes (Signed)
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 Has been coughing up white colored mucus and her oxygen has been falling into the 80%'s for the last two days, her physician told her to come and be checked out

## 2022-02-16 NOTE — Discharge Instructions (Signed)
You were evaluated in the Emergency Department and after careful evaluation, we did not find any emergent condition requiring admission or further testing in the hospital.  Your exam/testing today is overall reassuring.  Your x-ray appeared normal.  We are treating you for a possible occult pneumonia that was not seen on the x-ray.  Take the azithromycin and the amoxicillin antibiotics as directed.  Follow-up closely with your regular doctors.  Please return to the Emergency Department if you experience any worsening of your condition.   Thank you for allowing [place] to be a part of your care.

## 2022-02-16 NOTE — ED Provider Notes (Signed)
[facility] Emergency Department Provider Note MRN:  [id number]  Arrival date & time: 02/16/22     Chief Complaint   Cough   History of Present Illness   Claudia [name] is a 66 y.o. year-old female with a history of breast cancer presenting to the ED with chief complaint of cough.  Cough for the past 2 days, sometimes the oxygen number goes down into the high 80s when she is having a coughing spell at home.  Multiple family members with the same cough recently.  Chronic shortness of breath/dyspnea on exertion that is largely unchanged.  No chest pain.  No leg pain or swelling.  Review of Systems  A thorough review of systems was obtained and all systems are negative except as noted in the HPI and PMH.   Patient's Health History    Past Medical History:  Diagnosis Date   Anxiety    Breast cancer ([place])    Cancer of right breast ([place])    mastectomy   Depression    GERD (gastroesophageal reflux disease)    Hiatal hernia    Hypercholesterolemia    Hypertension    Irritable bowel    PONV (postoperative nausea and vomiting)     Past Surgical History:  Procedure Laterality Date   ABDOMINAL HYSTERECTOMY     BREAST SURGERY     lumpectomy   CHOLECYSTECTOMY     FOOT SURGERY     bilateral spur removal   HARDWARE REMOVAL Left 05/29/2014   Procedure: REMOVAL OF SCREW AND K-WIRE;  Surgeon: Sanjuana Kava, MD;  Location: [facility];  Service: Orthopedics;  Laterality: Left;   JOINT REPLACEMENT Left 2017   Ankle   MASTECTOMY     ORIF ANKLE FRACTURE Left 10/10/2013   Procedure: OPEN REDUCTION INTERNAL FIXATION (ORIF) ANKLE FRACTURE;  Surgeon: Sanjuana Kava, MD;  Location: [facility];  Service: Orthopedics;  Laterality: Left;   ORIF ANKLE FRACTURE Left 05/29/2014   Procedure: OPEN REDUCTION INTERNAL FIXATION (ORIF) ANKLE FRACTURE AND PLACEMENT OF BONE GRAFT MATERIAL;  Surgeon: Sanjuana Kava, MD;  Location: [facility];  Service: Orthopedics;  Laterality: Left;   TONSILLECTOMY      TOTAL ABDOMINAL HYSTERECTOMY W/ BILATERAL SALPINGOOPHORECTOMY     TOTAL KNEE ARTHROPLASTY Left 09/20/2017   Procedure: LEFT TOTAL KNEE ARTHROPLASTY;  Surgeon: Marybelle Killings, MD;  Location: [facility];  Service: Orthopedics;  Laterality: Left;   TUBAL LIGATION      Family History  Problem Relation Age of Onset   Liver disease Mother    Hypertension Father    Heart attack Sister     Social History   Socioeconomic History   Marital status: Divorced    Spouse name: Not on file   Number of children: Not on file   Years of education: Not on file   Highest education level: Not on file  Occupational History   Not on file  Tobacco Use   Smoking status: Former    Packs/day: 0.25    Years: 40.00    Total pack years: 10.00    Types: Cigarettes    Quit date: 07/23/2018    Years since quitting: 3.5   Smokeless tobacco: Never  Vaping Use   Vaping Use: Former  Substance and Sexual Activity   Alcohol use: No   Drug use: No   Sexual activity: Never    Birth control/protection: None  Other Topics Concern   Not on file  Social History Narrative   Not on file  Social Determinants of Health   Financial Resource Strain: Not on file  Food Insecurity: Not on file  Transportation Needs: Not on file  Physical Activity: Not on file  Stress: Not on file  Social Connections: Not on file  Intimate Partner Violence: Not on file     Physical Exam   Vitals:   02/16/22 0107  BP: 95/60  Pulse: 76  Resp: 18  Temp: 98.2 F (36.8 C)  SpO2: 91%    CONSTITUTIONAL: Well-appearing, NAD NEURO/PSYCH:  Alert and oriented x 3, no focal deficits EYES:  eyes equal and reactive ENT/NECK:  no LAD, no JVD CARDIO: Regular rate, well-perfused, normal S1 and S2 PULM:  CTAB no wheezing or rhonchi, crackles noted left lung base GI/GU:  non-distended, non-tender MSK/SPINE:  No gross deformities, no edema SKIN:  no rash, atraumatic   *Additional and/or pertinent findings included in MDM  below  Diagnostic and Interventional Summary    EKG Interpretation  Date/Time:    Ventricular Rate:    PR Interval:    QRS Duration:   QT Interval:    QTC Calculation:   R Axis:     Text Interpretation:         Labs Reviewed - No data to display  DG Chest 2 View  Final Result      Medications  azithromycin (ZITHROMAX) tablet 500 mg (has no administration in time range)  amoxicillin (AMOXIL) capsule 500 mg (has no administration in time range)  albuterol (VENTOLIN HFA) 108 (90 Base) MCG/ACT inhaler 2 puff (2 puffs Inhalation Given 02/16/22 0158)     Procedures  /  Critical Care Procedures  ED Course and Medical Decision Making  Initial Impression and Ddx Question viral illness versus pneumonia.  Overall doubt PE or pneumothorax or other emergent process.  Awaiting EKG and chest x-ray.  Patient with a bit of a soft blood pressure, 95/60 but well perfused, oxygen saturation about 91%, she explains that she rarely gets over 94%.  Past medical/surgical history that increases complexity of ED encounter: History of breast cancer  Interpretation of Diagnostics I personally reviewed the EKG and my interpretation is as follows: Sinus rhythm  Chest x-ray normal.  Patient Reassessment and Ultimate Disposition/Management     Patient continues to sit comfortably, reassuring vital signs, no hypoxia.  Question occult pneumonia given the crackles heard on exam and her somewhat decreased oxygen saturation.  Covering with antibiotics, restrict return precautions.  Patient management required discussion with the following services or consulting groups:  None  Complexity of Problems Addressed Acute illness or injury that poses threat of life of bodily function  Additional Data Reviewed and Analyzed Further history obtained from: Further history from spouse/family member  Additional Factors Impacting ED Encounter Risk Prescriptions  Barth Kirks. [place] Small, [place] [email or web address]  Final Clinical Impressions(s) / ED Diagnoses     ICD-10-CM   1. Cough, unspecified type  R05.9       ED Discharge Orders          Ordered    azithromycin (ZITHROMAX) 250 MG tablet  Daily        02/16/22 0207    amoxicillin (AMOXIL) 500 MG capsule  3 times daily        02/16/22 0207             Discharge Instructions Discussed with and Provided to Patient:     Discharge Instructions      You were evaluated  in the Emergency Department and after careful evaluation, we did not find any emergent condition requiring admission or further testing in the hospital.  Your exam/testing today is overall reassuring.  Your x-ray appeared normal.  We are treating you for a possible occult pneumonia that was not seen on the x-ray.  Take the azithromycin and the amoxicillin antibiotics as directed.  Follow-up closely with your regular doctors.  Please return to the Emergency Department if you experience any worsening of your condition.   Thank you for allowing [place] to be a part of your care.       Maudie Flakes, MD 02/16/22 [phone]

## 2023-07-21 IMAGING — DX DG CHEST 2V
2 series · 2 of 2 positions shown · non-contrast
Comparison: 08/05/2020

CLINICAL DATA: Short of breath, COVID positive, history of
metastatic breast cancer

EXAM:
CHEST - 2 VIEW

[chest pa]
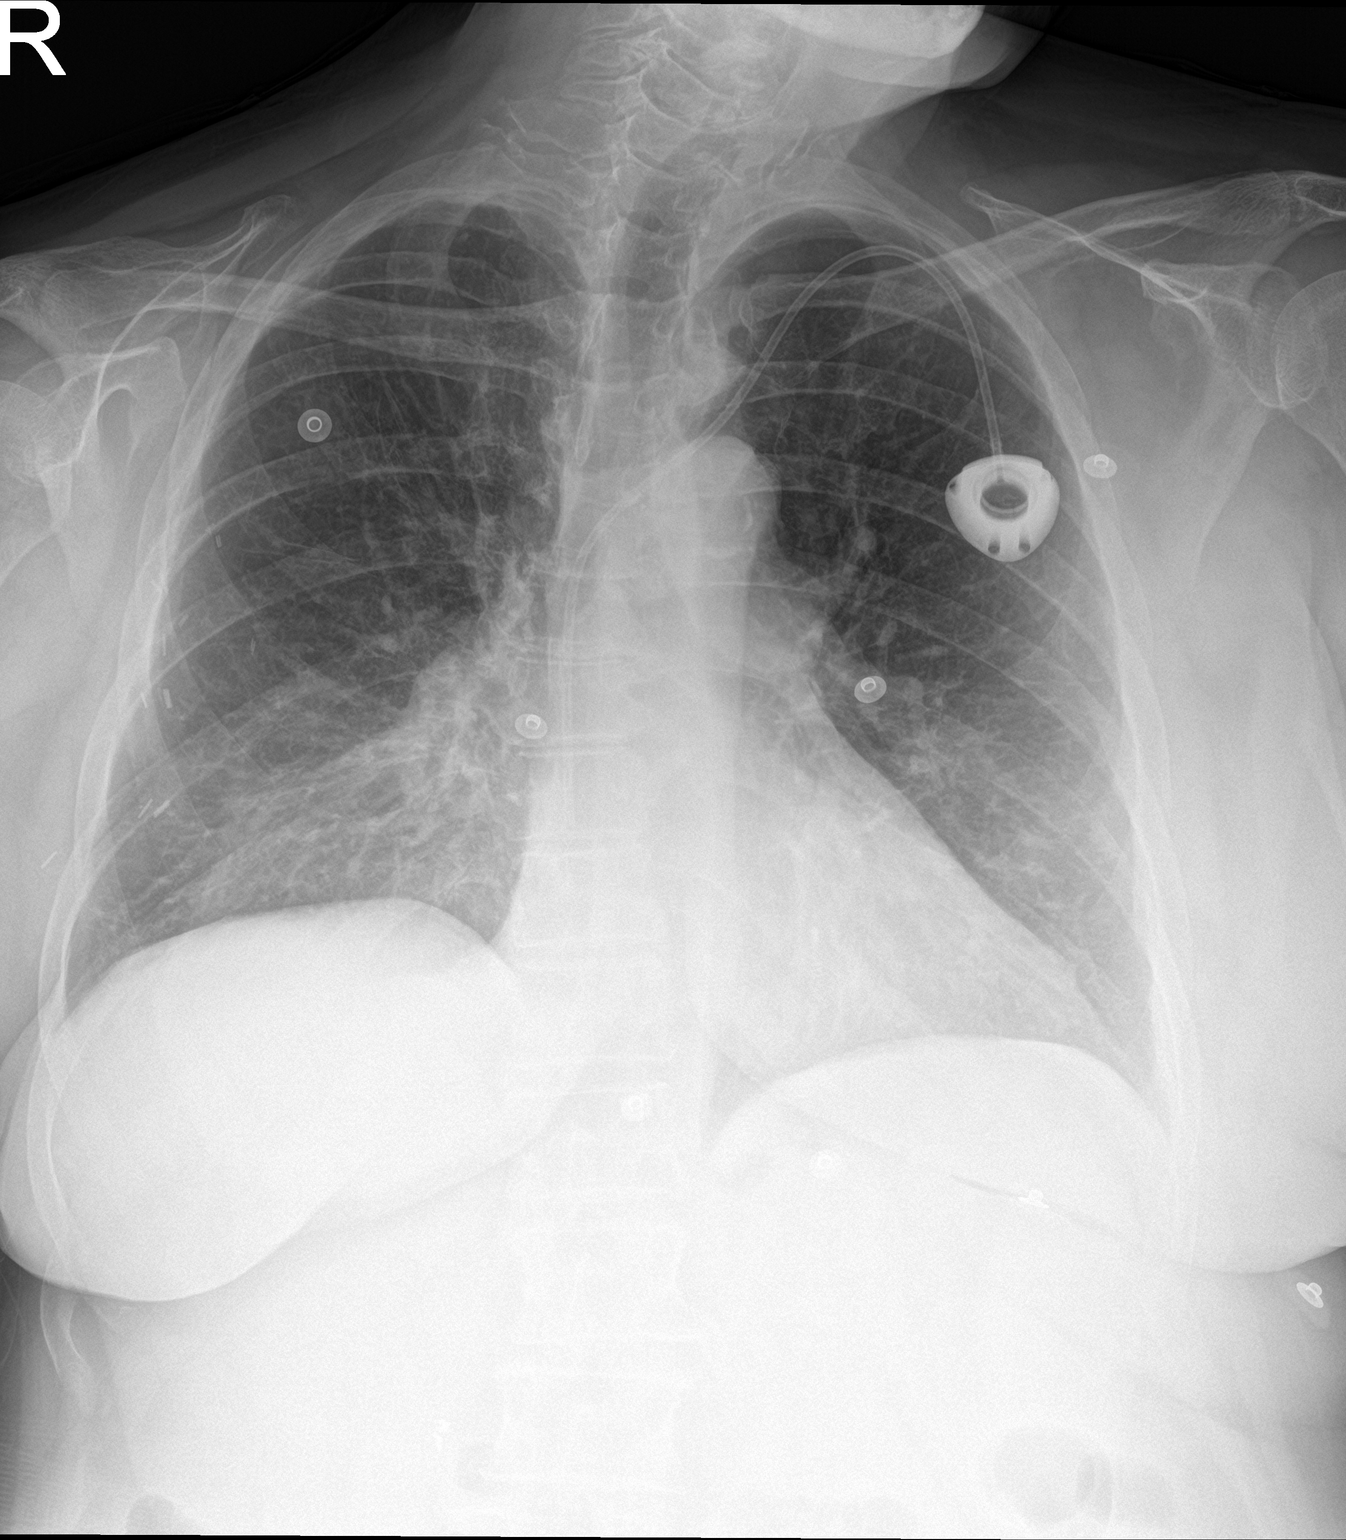

[chest lat]
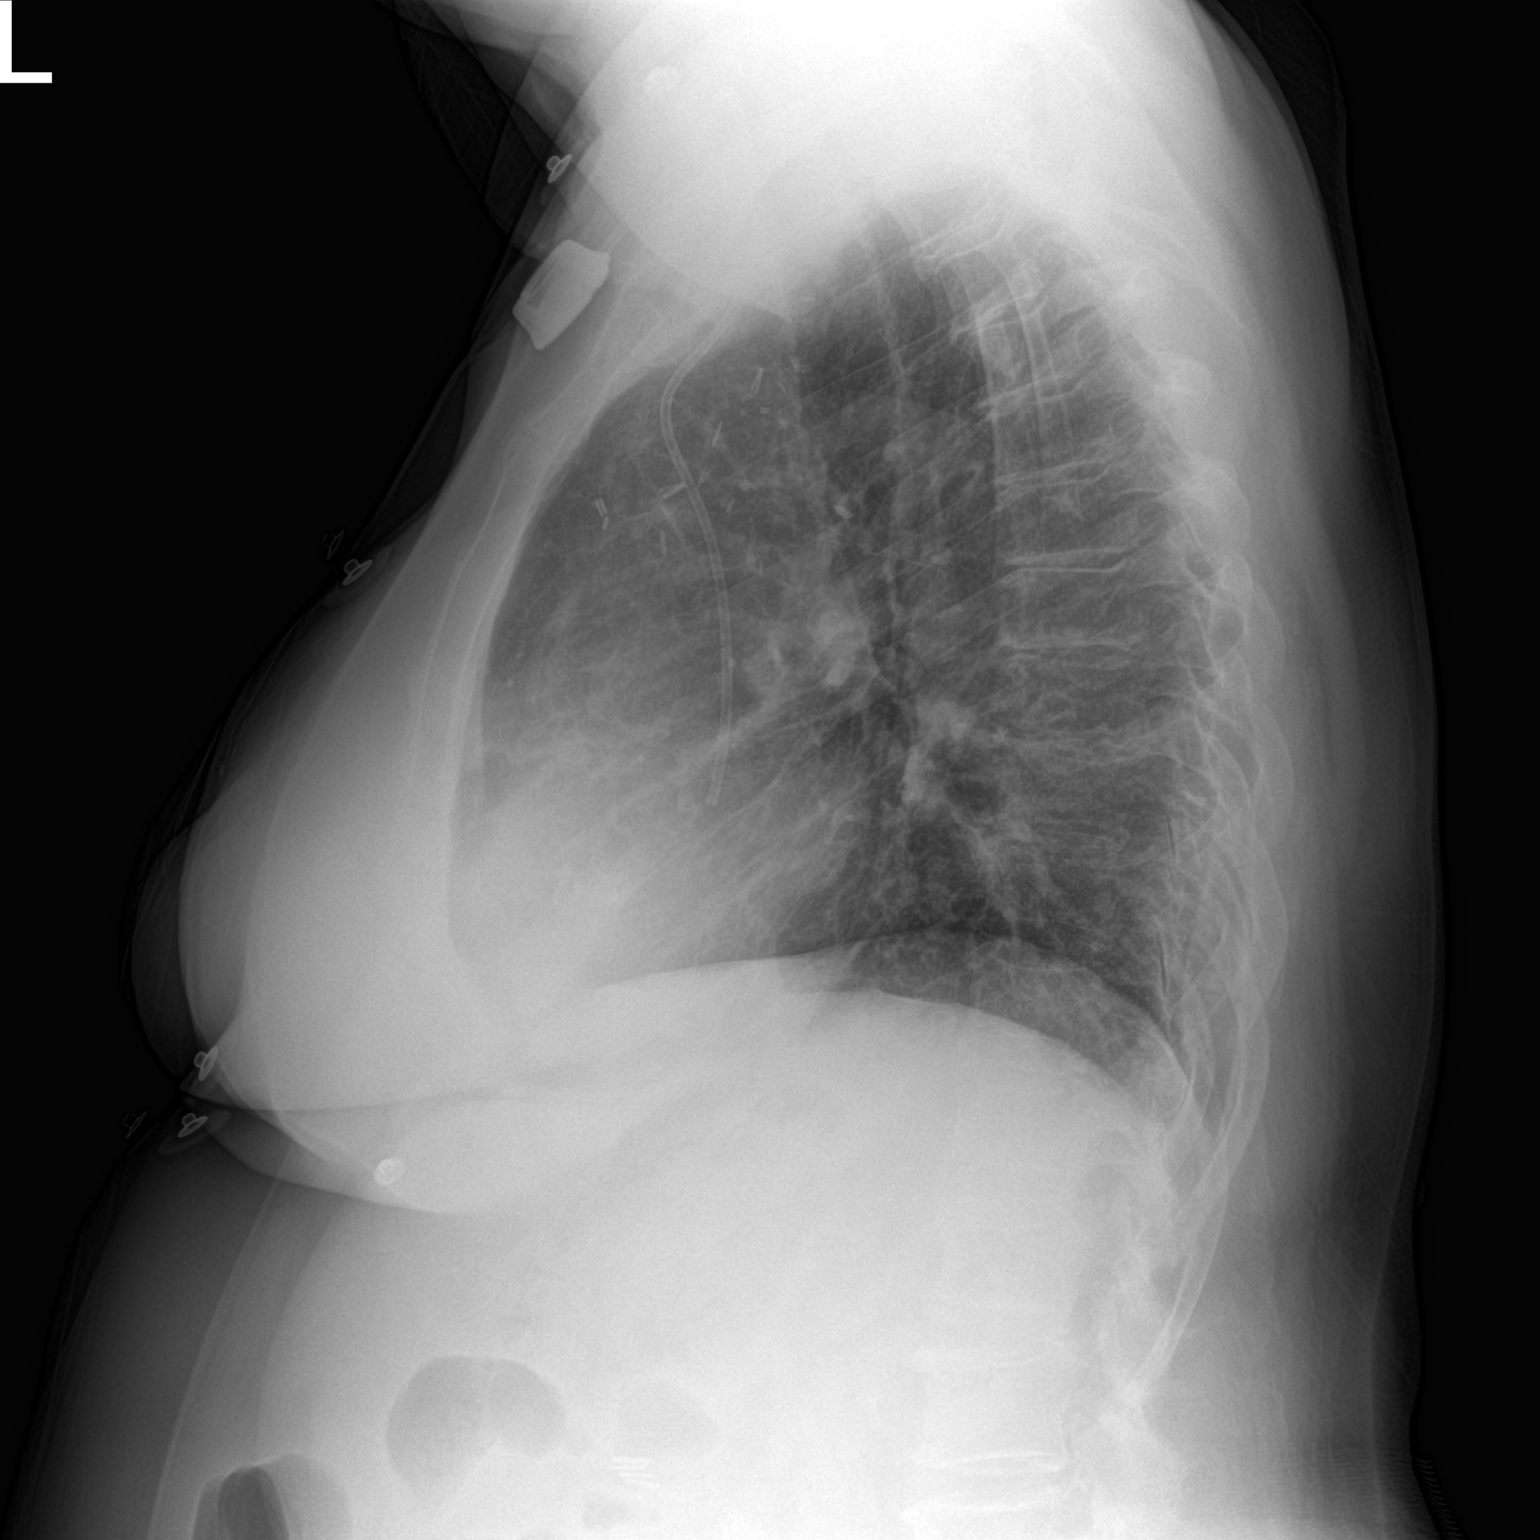

[2 of 2 positions shown; findings below may reference images not displayed]

FINDINGS: Frontal and lateral views of the chest demonstrate a stable chest
wall port. Cardiac silhouette is unremarkable. No acute airspace
disease, effusion, or pneumothorax. Resolution of the bibasilar
consolidation seen previously, with likely resulting scarring. No
acute bony abnormalities.
IMPRESSION: 1. No acute intrathoracic process.
2. Likely postinflammatory scarring at the lung bases.

## 2023-08-28 ENCOUNTER — Encounter (HOSPITAL_COMMUNITY): Payer: Self-pay

## 2023-08-28 ENCOUNTER — Emergency Department (HOSPITAL_COMMUNITY)
Admission: EM | Admit: 2023-08-28 | Discharge: 2023-08-29 | Disposition: A | Discharge status: Home / Self Care | Payer: 59 | Attending: Emergency Medicine | Admitting: Emergency Medicine

## 2023-08-28 ENCOUNTER — Emergency Department (HOSPITAL_COMMUNITY): Payer: 59

## 2023-08-28 ENCOUNTER — Other Ambulatory Visit: Payer: Self-pay

## 2023-08-28 DIAGNOSIS — Z20822 Contact with and (suspected) exposure to covid-19: Secondary | ICD-10-CM | POA: Insufficient documentation

## 2023-08-28 DIAGNOSIS — D84821 Immunodeficiency due to drugs: Secondary | ICD-10-CM | POA: Insufficient documentation

## 2023-08-28 DIAGNOSIS — Z853 Personal history of malignant neoplasm of breast: Secondary | ICD-10-CM | POA: Diagnosis not present

## 2023-08-28 DIAGNOSIS — Z720 Tobacco use: Secondary | ICD-10-CM | POA: Diagnosis not present

## 2023-08-28 DIAGNOSIS — R059 Cough, unspecified: Secondary | ICD-10-CM | POA: Diagnosis present

## 2023-08-28 DIAGNOSIS — I5030 Unspecified diastolic (congestive) heart failure: Secondary | ICD-10-CM | POA: Diagnosis not present

## 2023-08-28 DIAGNOSIS — Z7960 Long term (current) use of unspecified immunomodulators and immunosuppressants: Secondary | ICD-10-CM | POA: Insufficient documentation

## 2023-08-28 DIAGNOSIS — J189 Pneumonia, unspecified organism: Secondary | ICD-10-CM | POA: Insufficient documentation

## 2023-08-28 DIAGNOSIS — J1100 Influenza due to unidentified influenza virus with unspecified type of pneumonia: Secondary | ICD-10-CM | POA: Insufficient documentation

## 2023-08-28 LAB — CBC WITH DIFFERENTIAL/PLATELET
Abs Immature Granulocytes: 0.03 10*3/uL (ref 0.00–0.07)
Basophils Absolute: 0 10*3/uL (ref 0.0–0.1)
Basophils Relative: 0 %
Eosinophils Absolute: 0 10*3/uL (ref 0.0–0.5)
Eosinophils Relative: 0 %
HCT: 36 % (ref 36.0–46.0)
Hemoglobin: 11 g/dL — ABNORMAL LOW (ref 12.0–15.0)
Immature Granulocytes: 0 %
Lymphocytes Relative: 11 %
Lymphs Abs: 0.7 10*3/uL (ref 0.7–4.0)
MCH: 27.4 pg (ref 26.0–34.0)
MCHC: 30.6 g/dL (ref 30.0–36.0)
MCV: 89.8 fL (ref 80.0–100.0)
Monocytes Absolute: 0.5 10*3/uL (ref 0.1–1.0)
Monocytes Relative: 7 %
Neutro Abs: 5.5 10*3/uL (ref 1.7–7.7)
Neutrophils Relative %: 82 %
Platelets: 158 10*3/uL (ref 150–400)
RBC: 4.01 MIL/uL (ref 3.87–5.11)
RDW: 15.4 % (ref 11.5–15.5)
WBC: 6.7 10*3/uL (ref 4.0–10.5)
nRBC: 0 % (ref 0.0–0.2)

## 2023-08-28 LAB — RESP PANEL BY RT-PCR (RSV, FLU A&B, COVID)  RVPGX2
Influenza A by PCR: POSITIVE — AB
Influenza B by PCR: NEGATIVE
Resp Syncytial Virus by PCR: NEGATIVE
SARS Coronavirus 2 by RT PCR: NEGATIVE

## 2023-08-28 LAB — COMPREHENSIVE METABOLIC PANEL
ALT: 19 U/L (ref 0–44)
AST: 21 U/L (ref 15–41)
Albumin: 3.3 g/dL — ABNORMAL LOW (ref 3.5–5.0)
Alkaline Phosphatase: 67 U/L (ref 38–126)
Anion gap: 8 (ref 5–15)
BUN: 15 mg/dL (ref 8–23)
CO2: 24 mmol/L (ref 22–32)
Calcium: 8.5 mg/dL — ABNORMAL LOW (ref 8.9–10.3)
Chloride: 104 mmol/L (ref 98–111)
Creatinine, Ser: 0.96 mg/dL (ref 0.44–1.00)
GFR, Estimated: >60 mL/min (ref >60)
Glucose, Bld: 107 mg/dL — ABNORMAL HIGH (ref 70–99)
Potassium: 4 mmol/L (ref 3.5–5.1)
Sodium: 136 mmol/L (ref 135–145)
Total Bilirubin: 0.5 mg/dL (ref 0.0–1.2)
Total Protein: 7.2 g/dL (ref 6.5–8.1)

## 2023-08-28 MED ORDER — AZITHROMYCIN 250 MG PO TABS: 250.0000 mg | ORAL_TABLET | Freq: Every day | ORAL | 0 refills | Status: DC

## 2023-08-28 MED ORDER — IPRATROPIUM BROMIDE 0.02 % IN SOLN
RESPIRATORY_TRACT | Status: AC
  Administered 2023-08-28: 1 mg
  Filled 2023-08-28: qty 5

## 2023-08-28 MED ORDER — AMOXICILLIN-POT CLAVULANATE 875-125 MG PO TABS
1.0000 | ORAL_TABLET | Freq: Once | ORAL | Status: AC
  Administered 2023-08-28: 1 via ORAL
  Filled 2023-08-28: qty 1

## 2023-08-28 MED ORDER — ALBUTEROL SULFATE HFA 108 (90 BASE) MCG/ACT IN AERS
2.0000 | INHALATION_SPRAY | Freq: Once | RESPIRATORY_TRACT | Status: AC
  Administered 2023-08-28: 2 via RESPIRATORY_TRACT
  Filled 2023-08-28: qty 6.7

## 2023-08-28 MED ORDER — ALBUTEROL SULFATE (2.5 MG/3ML) 0.083% IN NEBU
10.0000 mg | INHALATION_SOLUTION | RESPIRATORY_TRACT | Status: AC
Start: 2023-08-28 — End: 2023-08-28
  Administered 2023-08-28: 10 mg via RESPIRATORY_TRACT
  Filled 2023-08-28 (×2): qty 12

## 2023-08-28 MED ORDER — AMOXICILLIN-POT CLAVULANATE 875-125 MG PO TABS: 1.0000 | ORAL_TABLET | Freq: Two times a day (BID) | ORAL | 0 refills | Status: DC

## 2023-08-28 MED ORDER — METHYLPREDNISOLONE SODIUM SUCC 125 MG IJ SOLR
125.0000 mg | Freq: Once | INTRAMUSCULAR | Status: AC
  Administered 2023-08-28: 125 mg via INTRAVENOUS
  Filled 2023-08-28: qty 2

## 2023-08-28 NOTE — Discharge Instructions (Signed)
Your x-ray showed that you did have a start of a small pneumonia, I want you to start taking the following medications to help with your symptoms  Albuterol is an inhaled medication which can help you to breathe better, you should take 2 puffs every 4 hours as needed, this may cause your heart to feel like it is racing, this should be a temporary side effect..  This is what we gave you to take home with you  Please take Augmentin, 1 tablet twice a day for the next 7 days, this is used to treat infections, it treats a variety of infections including animal bites, bacterial infections, and some gastrointestinal infections.   It is related to amoxicillin so do not take this medication if you are allergic to amoxicillin or penicillin.  effects of medications such as antibiotics include diarrhea which may occur as well as potentially inactivating birth control so if you are using a birth control pill please use an alternative form of birth control for the next 2 weeks.  There is occasions where this antibiotic does not work so if you are not improving within 48 hours you will need to be reevaluated immediately by your doctor or in the emergency department if your symptoms are worsening  Zithromax is an antibiotic that is taken daily for 5 days, take 2 pills on the first day then 1 pill a day for the next 4 days.  Thank you for allowing [place] to treat you in the emergency department today.  After reviewing your examination and potential testing that was done it appears that you are safe to go home.  I would like for you to follow-up with your doctor within the next several days, have them obtain your records and follow-up with them to review all potential tests and results from your visit.  If you should develop severe or worsening symptoms return to the emergency department immediately

## 2023-08-28 NOTE — ED Provider Notes (Signed)
[place] EMERGENCY DEPARTMENT AT [facility] ([place] Provider Note   CSN: [id number] Arrival date & time: 08/28/23  2120     History  Chief Complaint  Patient presents with   Cough    Vomiting, congestion    Claudia [name] is a 68 y.o. female.   Cough  This patient is a 68 year old female, she has a history of breast cancer with metastatic disease, she is currently on immunosuppressive therapy followed closely by oncology, she has been having serial echocardiograms over the years while on these chemotherapeutic medications and his had a preserved ejection fraction according to her report.  Over the last week after being exposed to some grandchildren who were sick with coughs and colds she is started develop some shortness of breath and cough.  She has some headache, occasional vomiting, she has not felt febrile, but has had a maximum temperature of 101.  No dysuria, no diarrhea.    Home Medications Prior to Admission medications   Medication Sig Start Date End Date Taking? Authorizing Provider  amoxicillin-clavulanate (AUGMENTIN) 875-125 MG tablet Take 1 tablet by mouth every 12 (twelve) hours. 08/28/23  Yes Eber Hong, MD  azithromycin (ZITHROMAX Z-PAK) 250 MG tablet Take 1 tablet (250 mg total) by mouth daily. 500mg  PO day 1, then 250mg  PO days 205 08/28/23  Yes Eber Hong, MD  albuterol (VENTOLIN HFA) 108 (90 Base) MCG/ACT inhaler Inhale 2 puffs into the lungs 3 (three) times daily. 08/01/20   Erick Blinks, MD  alprazolam Prudy Feeler) 2 MG tablet Take 2 mg by mouth 4 (four) times daily.    [provider]  amLODipine-benazepril (LOTREL) 10-20 MG capsule Take 1 capsule by mouth daily. Start in 3 days 08/15/20   Elgergawy, Leana Roe, MD  anastrozole (ARIMIDEX) 1 MG tablet Take 1 tablet by mouth daily. 07/17/20   [provider]  cetirizine (ZYRTEC) 10 MG tablet Take 10 mg by mouth daily.    [provider]  ezetimibe (ZETIA) 10 MG tablet Take 1 tablet  (10 mg total) by mouth daily. 09/02/12   Charm Rings, NP  gabapentin (NEURONTIN) 100 MG capsule Take 200 mg by mouth 3 (three) times daily as needed (burning/tingling). 05/07/20   [provider]  guaiFENesin-dextromethorphan (ROBITUSSIN DM) 100-10 MG/5ML syrup Take 10 mLs by mouth every 4 (four) hours as needed for cough. 08/01/20   Erick Blinks, MD  HYDROcodone-acetaminophen (NORCO) 5-325 MG tablet Take 1 tablet by mouth at bedtime as needed for moderate pain. 10/14/17   Eldred Manges, MD  ondansetron (ZOFRAN) 4 MG tablet Take 1 tablet (4 mg total) by mouth every 6 (six) hours. 07/10/21   Lutricia Feil, MD  sertraline (ZOLOFT) 100 MG tablet Take 250 mg by mouth daily.  09/02/12   Charm Rings, NP      Allergies    Statins    Review of Systems   Review of Systems  Respiratory:  Positive for cough.   All other systems reviewed and are negative.   Physical Exam Updated Vital Signs BP (!) 110/52   Pulse 73   Temp 98.8 F (37.1 C) (Oral)   Resp 18   Ht 1.626 m (5\' 4" )   Wt 117 kg   SpO2 94%   BMI 44.27 kg/m  Physical Exam Vitals and nursing note reviewed.  Constitutional:      General: She is not in acute distress.    Appearance: She is well-developed.  HENT:     Head: Normocephalic and  atraumatic.     Mouth/Throat:     Pharynx: No oropharyngeal exudate.  Eyes:     General: No scleral icterus.       Right eye: No discharge.        Left eye: No discharge.     Conjunctiva/sclera: Conjunctivae normal.     Pupils: Pupils are equal, round, and reactive to light.  Neck:     Thyroid: No thyromegaly.     Vascular: No JVD.  Cardiovascular:     Rate and Rhythm: Normal rate and regular rhythm.     Heart sounds: Normal heart sounds. No murmur heard.    No friction rub. No gallop.  Pulmonary:     Effort: Pulmonary effort is normal. No respiratory distress.     Breath sounds: Normal breath sounds. No wheezing or rales.  Abdominal:     General: Bowel sounds are  normal. There is no distension.     Palpations: Abdomen is soft. There is no mass.     Tenderness: There is no abdominal tenderness.  Musculoskeletal:        General: No tenderness. Normal range of motion.     Cervical back: Normal range of motion and neck supple.     Right lower leg: No edema.     Left lower leg: No edema.  Lymphadenopathy:     Cervical: No cervical adenopathy.  Skin:    General: Skin is warm and dry.     Findings: No erythema or rash.  Neurological:     Mental Status: She is alert.     Coordination: Coordination normal.  Psychiatric:        Behavior: Behavior normal.     ED Results / Procedures / Treatments   Labs (all labs ordered are listed, but only abnormal results are displayed) Labs Reviewed  CBC WITH DIFFERENTIAL/PLATELET - Abnormal; Notable for the following components:      Result Value   Hemoglobin 11.0 (*)    All other components within normal limits  COMPREHENSIVE METABOLIC PANEL - Abnormal; Notable for the following components:   Glucose, Bld 107 (*)    Calcium 8.5 (*)    Albumin 3.3 (*)    All other components within normal limits  RESP PANEL BY RT-PCR (RSV, FLU A&B, COVID)  RVPGX2  BRAIN NATRIURETIC PEPTIDE    EKG None  Radiology DG Chest Port 1 View Result Date: 08/28/2023 CLINICAL DATA:  Cough and shortness of breath. Congestion, vomiting, and fever starting yesterday. History of metastatic breast cancer. EXAM: PORTABLE CHEST 1 VIEW COMPARISON:  02/16/2022 FINDINGS: Left central venous catheter with tip over the cavoatrial junction region. Heart size and pulmonary vascularity are normal. Developing infiltration suggested over the right lung base with possible patchy left perihilar infiltrates. Changes likely to represent multifocal pneumonia. No pleural effusion or pneumothorax. Mediastinal contours appear intact. Surgical clips in the right axilla. IMPRESSION: Infiltrates are suggested in the right lung base and left perihilar region,  probably representing multifocal pneumonia. Electronically Signed   By: Burman Nieves M.D.   On: 08/28/2023 23:06    Procedures Procedures    Medications Ordered in ED Medications  albuterol (PROVENTIL) (2.5 MG/3ML) 0.083% nebulizer solution 10 mg (10 mg Nebulization New Bag/Given 08/28/23 2311)  amoxicillin-clavulanate (AUGMENTIN) 875-125 MG per tablet 1 tablet (has no administration in time range)  albuterol (VENTOLIN HFA) 108 (90 Base) MCG/ACT inhaler 2 puff (has no administration in time range)  methylPREDNISolone sodium succinate (SOLU-MEDROL) 125 mg/2 mL injection 125 mg (125  mg Intravenous Given 08/28/23 2243)  ipratropium (ATROVENT) 0.02 % nebulizer solution (1 mg  Given 08/28/23 2310)    ED Course/ Medical Decision Making/ A&P                                 Medical Decision Making Amount and/or Complexity of Data Reviewed Labs: ordered. Radiology: ordered.  Risk Prescription drug management.    This patient presents to the ED for concern of shortness of breath cough and some vomiting with a febrile illness differential diagnosis includes infection, sepsis, pneumonia, COPD, viral infection, it is certainly influenza season    Additional history obtained:  Additional history obtained from medical record External records from outside source obtained and reviewed including oncology history   Lab Tests:  I Ordered, and personally interpreted labs.  The pertinent results include: No leukocytosis   Imaging Studies ordered:  I ordered imaging studies including chest x-ray I independently visualized and interpreted imaging which showed small areas that could be considered to be early pneumonia I agree with the radiologist interpretation   Medicines ordered and prescription drug management:  I ordered medication including albuterol nebulizer and inhaler for wheezing and coughing Reevaluation of the patient after these medicines showed that the patient improved, in  fact the patient's oxygen level is up to 95% at discharge I have reviewed the patients home medicines and have made adjustments as needed   Problem List / ED Course:  The patient has what appears to be new onset mild pneumonia, she does not have a leukocytosis she is not febrile here and she is not tachycardic or hypotensive.  She does have a mild hypoxia initially which improved with nebulized treatments.  She is a longtime smoker although she stopped 5 years ago so I suspect she has some degree of underlying emphysema   Social Determinants of Health:  Prior tobacco use The patient is agreeable to the plan, she was given an albuterol MDI with a spacer for home           Final Clinical Impression(s) / ED Diagnoses Final diagnoses:  Community acquired pneumonia, unspecified laterality    Rx / DC Orders ED Discharge Orders          Ordered    amoxicillin-clavulanate (AUGMENTIN) 875-125 MG tablet  Every 12 hours        08/28/23 2313    azithromycin (ZITHROMAX Z-PAK) 250 MG tablet  Daily        08/28/23 2313              Eber Hong, MD 08/28/23 2316

## 2023-08-28 NOTE — ED Triage Notes (Signed)
Pt to ED from home with reports of cough, congestion, vomiting, sob, and fever that started yesterday. Pt has hx of breast CA with mets to lungs-in remission x 4 years, currently on PO immunosuppressive meds

## 2023-08-28 NOTE — ED Notes (Signed)
Patient instructed in use of albuterol inhaler with spacer.

## 2023-08-28 NOTE — ED Notes (Signed)
 Dr Hyacinth Meeker at bedside.

## 2023-08-29 LAB — BRAIN NATRIURETIC PEPTIDE: B Natriuretic Peptide: 297 pg/mL — ABNORMAL HIGH (ref 0.0–100.0)

## 2023-09-01 ENCOUNTER — Encounter (HOSPITAL_COMMUNITY): Payer: Self-pay

## 2023-09-01 ENCOUNTER — Emergency Department (HOSPITAL_COMMUNITY): Payer: 59

## 2023-09-01 ENCOUNTER — Other Ambulatory Visit: Payer: Self-pay

## 2023-09-01 ENCOUNTER — Inpatient Hospital Stay (HOSPITAL_COMMUNITY)
Admission: EM | Admit: 2023-09-01 | Discharge: 2023-09-03 | DRG: 193 | Disposition: A | Discharge status: Home / Self Care | Payer: 59 | Attending: Family Medicine | Admitting: Family Medicine

## 2023-09-01 DIAGNOSIS — Z87891 Personal history of nicotine dependence: Secondary | ICD-10-CM

## 2023-09-01 DIAGNOSIS — Z96652 Presence of left artificial knee joint: Secondary | ICD-10-CM | POA: Diagnosis present

## 2023-09-01 DIAGNOSIS — Z888 Allergy status to other drugs, medicaments and biological substances status: Secondary | ICD-10-CM

## 2023-09-01 DIAGNOSIS — Z90710 Acquired absence of both cervix and uterus: Secondary | ICD-10-CM

## 2023-09-01 DIAGNOSIS — J101 Influenza due to other identified influenza virus with other respiratory manifestations: Principal | ICD-10-CM | POA: Diagnosis present

## 2023-09-01 DIAGNOSIS — E876 Hypokalemia: Secondary | ICD-10-CM | POA: Diagnosis present

## 2023-09-01 DIAGNOSIS — E66813 Obesity, class 3: Secondary | ICD-10-CM | POA: Diagnosis present

## 2023-09-01 DIAGNOSIS — Z79899 Other long term (current) drug therapy: Secondary | ICD-10-CM

## 2023-09-01 DIAGNOSIS — Z8249 Family history of ischemic heart disease and other diseases of the circulatory system: Secondary | ICD-10-CM

## 2023-09-01 DIAGNOSIS — E782 Mixed hyperlipidemia: Secondary | ICD-10-CM | POA: Diagnosis present

## 2023-09-01 DIAGNOSIS — I10 Essential (primary) hypertension: Secondary | ICD-10-CM | POA: Diagnosis present

## 2023-09-01 DIAGNOSIS — R0902 Hypoxemia: Secondary | ICD-10-CM

## 2023-09-01 DIAGNOSIS — Z1152 Encounter for screening for COVID-19: Secondary | ICD-10-CM

## 2023-09-01 DIAGNOSIS — Z853 Personal history of malignant neoplasm of breast: Secondary | ICD-10-CM

## 2023-09-01 DIAGNOSIS — K219 Gastro-esophageal reflux disease without esophagitis: Secondary | ICD-10-CM | POA: Diagnosis present

## 2023-09-01 DIAGNOSIS — Z604 Social exclusion and rejection: Secondary | ICD-10-CM | POA: Diagnosis present

## 2023-09-01 DIAGNOSIS — J9601 Acute respiratory failure with hypoxia: Secondary | ICD-10-CM | POA: Diagnosis present

## 2023-09-01 DIAGNOSIS — Z5982 Transportation insecurity: Secondary | ICD-10-CM

## 2023-09-01 DIAGNOSIS — Z6841 Body Mass Index (BMI) 40.0 and over, adult: Secondary | ICD-10-CM

## 2023-09-01 DIAGNOSIS — D84821 Immunodeficiency due to drugs: Secondary | ICD-10-CM | POA: Diagnosis present

## 2023-09-01 DIAGNOSIS — Z5986 Financial insecurity: Secondary | ICD-10-CM

## 2023-09-01 DIAGNOSIS — Z7960 Long term (current) use of unspecified immunomodulators and immunosuppressants: Secondary | ICD-10-CM

## 2023-09-01 DIAGNOSIS — F419 Anxiety disorder, unspecified: Secondary | ICD-10-CM | POA: Diagnosis present

## 2023-09-01 DIAGNOSIS — Z79811 Long term (current) use of aromatase inhibitors: Secondary | ICD-10-CM

## 2023-09-01 DIAGNOSIS — F32A Depression, unspecified: Secondary | ICD-10-CM | POA: Diagnosis present

## 2023-09-01 NOTE — ED Provider Notes (Signed)
 [facility] Emergency Department Provider Note MRN:  [id number]  Arrival date & time: 09/02/23     Chief Complaint   Low oxygen   History of Present Illness   Claudia [name] is a 68 y.o. year-old female with a history of breast cancer presenting to the ED with chief complaint of low oxygen.  Patient felt lightheaded today with general malaise, weakness, checked her oxygen levels and they were low at home, as low as 85% and persistently in the 80s, called her doctors at [facility] and they told her to seek the closest emergency department.  Denies shortness of breath, no chest pain, no fever or cough.  Recovering from a pneumonia recently.  Review of Systems  A thorough review of systems was obtained and all systems are negative except as noted in the HPI and PMH.   Patient's Health History    Past Medical History:  Diagnosis Date   Anxiety    Breast cancer (HCC)    Cancer of right breast (HCC)    mastectomy   Depression    GERD (gastroesophageal reflux disease)    Hiatal hernia    Hypercholesterolemia    Hypertension    Irritable bowel    PONV (postoperative nausea and vomiting)     Past Surgical History:  Procedure Laterality Date   ABDOMINAL HYSTERECTOMY     BREAST SURGERY     lumpectomy   CHOLECYSTECTOMY     FOOT SURGERY     bilateral spur removal   HARDWARE REMOVAL Left 05/29/2014   Procedure: REMOVAL OF SCREW AND K-WIRE;  Surgeon: Lemond Stable, MD;  Location: [facility];  Service: Orthopedics;  Laterality: Left;   JOINT REPLACEMENT Left 2017   Ankle   MASTECTOMY     ORIF ANKLE FRACTURE Left 10/10/2013   Procedure: OPEN REDUCTION INTERNAL FIXATION (ORIF) ANKLE FRACTURE;  Surgeon: Lemond Stable, MD;  Location: [facility];  Service: Orthopedics;  Laterality: Left;   ORIF ANKLE FRACTURE Left 05/29/2014   Procedure: OPEN REDUCTION INTERNAL FIXATION (ORIF) ANKLE FRACTURE AND PLACEMENT OF BONE GRAFT MATERIAL;  Surgeon: Lemond Stable, MD;  Location: [facility];   Service: Orthopedics;  Laterality: Left;   TONSILLECTOMY     TOTAL ABDOMINAL HYSTERECTOMY W/ BILATERAL SALPINGOOPHORECTOMY     TOTAL KNEE ARTHROPLASTY Left 09/20/2017   Procedure: LEFT TOTAL KNEE ARTHROPLASTY;  Surgeon: Barbarann Oneil BROCKS, MD;  Location: [facility] OR;  Service: Orthopedics;  Laterality: Left;   TUBAL LIGATION      Family History  Problem Relation Age of Onset   Liver disease Mother    Hypertension Father    Heart attack Sister     Social History   Socioeconomic History   Marital status: Divorced    Spouse name: Not on file   Number of children: Not on file   Years of education: Not on file   Highest education level: Not on file  Occupational History   Not on file  Tobacco Use   Smoking status: Former    Current packs/day: 0.00    Average packs/day: 0.3 packs/day for 40.0 years (10.0 ttl pk-yrs)    Types: Cigarettes    Start date: 07/23/1978    Quit date: 07/23/2018    Years since quitting: 5.1   Smokeless tobacco: Never  Vaping Use   Vaping status: Former  Substance and Sexual Activity   Alcohol use: No   Drug use: No   Sexual activity: Never    Birth control/protection: None  Other Topics Concern  Not on file  Social History Narrative   Not on file   Social Drivers of Health   Financial Resource Strain: Medium Risk (05/24/2023)   Received from [facility]   Overall Financial Resource Strain (CARDIA)    Difficulty of Paying Living Expenses: Somewhat hard  Food Insecurity: No Food Insecurity (05/24/2023)   Received from [facility]   Hunger Vital Sign    Worried About Running Out of Food in the Last Year: Never true    Ran Out of Food in the Last Year: Never true  Transportation Needs: Unmet Transportation Needs (05/24/2023)   Received from [facility] - Transportation    In the past 12 months, has lack of transportation kept you from medical appointments or from getting medications?: Yes     Lack of Transportation (Non-Medical): Yes  Physical Activity: Inactive (12/01/2022)   Received from [facility], [facility]   Exercise Vital Sign    Days of Exercise per Week: 0 days    Minutes of Exercise per Session: 0 min  Stress: Stress Concern Present (12/01/2022)   Received from [facility], [facility]   [employer] of Occupational Health - Occupational Stress Questionnaire    Feeling of Stress : Very much  Social Connections: Socially Isolated (12/01/2022)   Received from [facility], [facility]   Social Connection and Isolation Panel [NHANES]    Frequency of Communication with Friends and Family: More than three times a week    Frequency of Social Gatherings with Friends and Family: More than three times a week    Attends Religious Services: Never    Database Administrator or Organizations: No    Attends Banker Meetings: Never    Marital Status: Divorced  Catering Manager Violence: Not on file     Physical Exam   Vitals:   09/02/23 0215 09/02/23 0254  BP:    Pulse: 76   Resp: 20   Temp:  98.6 F (37 C)  SpO2: 95%     CONSTITUTIONAL: Well-appearing, NAD NEURO/PSYCH:  Alert and oriented x 3, no focal deficits EYES:  eyes equal and reactive ENT/NECK:  no LAD, no JVD CARDIO: Regular rate, well-perfused, normal S1 and S2 PULM:  CTAB no wheezing or rhonchi GI/GU:  non-distended, non-tender MSK/SPINE:  No gross deformities, no edema SKIN:  no rash, atraumatic   *Additional and/or pertinent findings included in MDM below  Diagnostic and Interventional Summary    EKG Interpretation Date/Time:  September 02, 2023 at 00:13:47 Ventricular Rate:   72 PR Interval:   168 QRS Duration:   91 QT Interval:   403 QTC Calculation:  441 R Axis:      Text Interpretation: Sinus rhythm, no significant change from prior       Labs Reviewed  RESP PANEL  BY RT-PCR (RSV, FLU A&B, COVID)  RVPGX2 - Abnormal; Notable for the following components:      Result Value   Influenza A by PCR POSITIVE (*)    All other components within normal limits  CBC - Abnormal; Notable for the following components:   RDW 15.7 (*)    All other components within normal limits  BASIC METABOLIC PANEL - Abnormal; Notable for the following components:   Creatinine, Ser 1.39 (*)    Calcium 8.6 (*)    GFR, Estimated 42 (*)    All  other components within normal limits  BRAIN NATRIURETIC PEPTIDE  TROPONIN I (HIGH SENSITIVITY)  TROPONIN I (HIGH SENSITIVITY)    CT Angio Chest Pulmonary Embolism (PE) W or WO Contrast  Final Result    DG Chest 2 View  Final Result      Medications  iohexol  (OMNIPAQUE ) 350 MG/ML injection 75 mL (75 mLs Intravenous Contrast Given 09/02/23 0102)  oseltamivir  (TAMIFLU ) capsule 75 mg (75 mg Oral Given 09/02/23 0253)  ipratropium-albuterol  (DUONEB) 0.5-2.5 (3) MG/3ML nebulizer solution 3 mL (3 mLs Nebulization Given 09/02/23 0305)     Procedures  /  Critical Care Procedures  ED Course and Medical Decision Making  Initial Impression and Ddx Differential diagnosis includes worsening pneumonia that has failed treatment, pulmonary embolism is considered especially given patient's active cancer.  Metastatic lesions are a concern as well.  Awaiting CT imaging.  Past medical/surgical history that increases complexity of ED encounter: Metastatic breast cancer  Interpretation of Diagnostics I personally reviewed the EKG and my interpretation is as follows: Sinus rhythm  Labs overall reassuring with no significant blood count or electrolyte disturbance.  Mild elevation in creatinine, flu a positive.  Patient Reassessment and Ultimate Disposition/Management     CT imaging is without evidence of PE or bacterial pneumonia, there is evidence of reactive airway disease.  On reassessment patient has mild increased respiratory effort compared to prior  exam, 7-10 word dyspnea with some audible wheezing.  I turned off the oxygen and she desaturated to 87% on room air, put her back on 2 L nasal cannula.  Accepted for admission by hospitalist service.  Patient management required discussion with the following services or consulting groups:  Hospitalist Service  Complexity of Problems Addressed Acute illness or injury that poses threat of life of bodily function  Additional Data Reviewed and Analyzed Further history obtained from: Further history from spouse/family member  Additional Factors Impacting ED Encounter Risk Consideration of hospitalization  Ozell HERO. Theadore, MD [facility] Emergency Medicine [facility] [email or web address]  Final Clinical Impressions(s) / ED Diagnoses     ICD-10-CM   1. Influenza A  J10.1     2. Hypoxia  R09.02       ED Discharge Orders     None        Discharge Instructions Discussed with and Provided to Patient:   Discharge Instructions   None      Theadore Ozell HERO, MD 09/02/23 [phone]

## 2023-09-01 NOTE — ED Triage Notes (Signed)
 Pt  from home with reports of low oxygen, pt normally not on oxygen, pt was dx with PNA a few days ago and still taking them. Pt is 94-95% in triage for full 5 minutes without dropping, pt says she feels ok, just oxygen is low per home check.

## 2023-09-02 ENCOUNTER — Other Ambulatory Visit: Payer: Self-pay

## 2023-09-02 DIAGNOSIS — E782 Mixed hyperlipidemia: Secondary | ICD-10-CM | POA: Insufficient documentation

## 2023-09-02 DIAGNOSIS — Z87891 Personal history of nicotine dependence: Secondary | ICD-10-CM | POA: Diagnosis not present

## 2023-09-02 DIAGNOSIS — D84821 Immunodeficiency due to drugs: Secondary | ICD-10-CM | POA: Diagnosis present

## 2023-09-02 DIAGNOSIS — Z1152 Encounter for screening for COVID-19: Secondary | ICD-10-CM | POA: Diagnosis not present

## 2023-09-02 DIAGNOSIS — F419 Anxiety disorder, unspecified: Secondary | ICD-10-CM | POA: Diagnosis present

## 2023-09-02 DIAGNOSIS — Z853 Personal history of malignant neoplasm of breast: Secondary | ICD-10-CM | POA: Diagnosis not present

## 2023-09-02 DIAGNOSIS — Z96652 Presence of left artificial knee joint: Secondary | ICD-10-CM | POA: Diagnosis present

## 2023-09-02 DIAGNOSIS — E876 Hypokalemia: Secondary | ICD-10-CM | POA: Diagnosis present

## 2023-09-02 DIAGNOSIS — Z79811 Long term (current) use of aromatase inhibitors: Secondary | ICD-10-CM | POA: Diagnosis not present

## 2023-09-02 DIAGNOSIS — Z6841 Body Mass Index (BMI) 40.0 and over, adult: Secondary | ICD-10-CM | POA: Diagnosis not present

## 2023-09-02 DIAGNOSIS — E66813 Obesity, class 3: Secondary | ICD-10-CM | POA: Diagnosis present

## 2023-09-02 DIAGNOSIS — Z8249 Family history of ischemic heart disease and other diseases of the circulatory system: Secondary | ICD-10-CM | POA: Diagnosis not present

## 2023-09-02 DIAGNOSIS — J9601 Acute respiratory failure with hypoxia: Secondary | ICD-10-CM | POA: Diagnosis present

## 2023-09-02 DIAGNOSIS — Z7960 Long term (current) use of unspecified immunomodulators and immunosuppressants: Secondary | ICD-10-CM | POA: Diagnosis not present

## 2023-09-02 DIAGNOSIS — Z5982 Transportation insecurity: Secondary | ICD-10-CM | POA: Diagnosis not present

## 2023-09-02 DIAGNOSIS — Z5986 Financial insecurity: Secondary | ICD-10-CM | POA: Diagnosis not present

## 2023-09-02 DIAGNOSIS — J101 Influenza due to other identified influenza virus with other respiratory manifestations: Principal | ICD-10-CM

## 2023-09-02 DIAGNOSIS — I10 Essential (primary) hypertension: Secondary | ICD-10-CM | POA: Diagnosis present

## 2023-09-02 DIAGNOSIS — Z90710 Acquired absence of both cervix and uterus: Secondary | ICD-10-CM | POA: Diagnosis not present

## 2023-09-02 DIAGNOSIS — K219 Gastro-esophageal reflux disease without esophagitis: Secondary | ICD-10-CM | POA: Diagnosis present

## 2023-09-02 DIAGNOSIS — F32A Depression, unspecified: Secondary | ICD-10-CM | POA: Diagnosis present

## 2023-09-02 DIAGNOSIS — Z604 Social exclusion and rejection: Secondary | ICD-10-CM | POA: Diagnosis present

## 2023-09-02 LAB — COMPREHENSIVE METABOLIC PANEL
ALT: 33 U/L (ref 0–44)
AST: 40 U/L (ref 15–41)
Albumin: 3.3 g/dL — ABNORMAL LOW (ref 3.5–5.0)
Alkaline Phosphatase: 66 U/L (ref 38–126)
Anion gap: 8 (ref 5–15)
BUN: 22 mg/dL (ref 8–23)
CO2: 26 mmol/L (ref 22–32)
Calcium: 8.5 mg/dL — ABNORMAL LOW (ref 8.9–10.3)
Chloride: 101 mmol/L (ref 98–111)
Creatinine, Ser: 1.01 mg/dL — ABNORMAL HIGH (ref 0.44–1.00)
GFR, Estimated: >60 mL/min (ref >60)
Glucose, Bld: 84 mg/dL (ref 70–99)
Potassium: 3.4 mmol/L — ABNORMAL LOW (ref 3.5–5.1)
Sodium: 135 mmol/L (ref 135–145)
Total Bilirubin: 0.8 mg/dL (ref 0.0–1.2)
Total Protein: 6.8 g/dL (ref 6.5–8.1)

## 2023-09-02 LAB — RESP PANEL BY RT-PCR (RSV, FLU A&B, COVID)  RVPGX2
Influenza A by PCR: POSITIVE — AB
Influenza B by PCR: NEGATIVE
Resp Syncytial Virus by PCR: NEGATIVE
SARS Coronavirus 2 by RT PCR: NEGATIVE

## 2023-09-02 LAB — CBC
HCT: 37.9 % (ref 36.0–46.0)
HCT: 36.9 % (ref 36.0–46.0)
Hemoglobin: 12.1 g/dL (ref 12.0–15.0)
Hemoglobin: 11.3 g/dL — ABNORMAL LOW (ref 12.0–15.0)
MCH: 28.5 pg (ref 26.0–34.0)
MCH: 27.4 pg (ref 26.0–34.0)
MCHC: 31.9 g/dL (ref 30.0–36.0)
MCHC: 30.6 g/dL (ref 30.0–36.0)
MCV: 89.2 fL (ref 80.0–100.0)
MCV: 89.3 fL (ref 80.0–100.0)
Platelets: 159 10*3/uL (ref 150–400)
Platelets: 133 10*3/uL — ABNORMAL LOW (ref 150–400)
RBC: 4.25 MIL/uL (ref 3.87–5.11)
RBC: 4.13 MIL/uL (ref 3.87–5.11)
RDW: 15.7 % — ABNORMAL HIGH (ref 11.5–15.5)
RDW: 15.4 % (ref 11.5–15.5)
WBC: 6.6 10*3/uL (ref 4.0–10.5)
WBC: 5.8 10*3/uL (ref 4.0–10.5)
nRBC: 0 % (ref 0.0–0.2)
nRBC: 0 % (ref 0.0–0.2)

## 2023-09-02 LAB — HIV ANTIBODY (ROUTINE TESTING W REFLEX): HIV Screen 4th Generation wRfx: NONREACTIVE

## 2023-09-02 LAB — BASIC METABOLIC PANEL
Anion gap: 8 (ref 5–15)
BUN: 21 mg/dL (ref 8–23)
CO2: 25 mmol/L (ref 22–32)
Calcium: 8.6 mg/dL — ABNORMAL LOW (ref 8.9–10.3)
Chloride: 102 mmol/L (ref 98–111)
Creatinine, Ser: 1.39 mg/dL — ABNORMAL HIGH (ref 0.44–1.00)
GFR, Estimated: 42 mL/min — ABNORMAL LOW (ref >60)
Glucose, Bld: 88 mg/dL (ref 70–99)
Potassium: 4 mmol/L (ref 3.5–5.1)
Sodium: 135 mmol/L (ref 135–145)

## 2023-09-02 LAB — TROPONIN I (HIGH SENSITIVITY)
Troponin I (High Sensitivity): 7 ng/L (ref <18)
Troponin I (High Sensitivity): 7 ng/L (ref <18)

## 2023-09-02 LAB — BRAIN NATRIURETIC PEPTIDE: B Natriuretic Peptide: 14 pg/mL (ref 0.0–100.0)

## 2023-09-02 LAB — MAGNESIUM: Magnesium: 2 mg/dL (ref 1.7–2.4)

## 2023-09-02 MED ORDER — EZETIMIBE 10 MG PO TABS
10.0000 mg | ORAL_TABLET | Freq: Every day | ORAL | Status: DC
  Administered 2023-09-02: 10 mg via ORAL
  Filled 2023-09-02 (×3): qty 1

## 2023-09-02 MED ORDER — DOXYCYCLINE HYCLATE 100 MG PO TABS
100.0000 mg | ORAL_TABLET | Freq: Two times a day (BID) | ORAL | Status: DC
  Administered 2023-09-02 – 2023-09-03 (×3): 100 mg via ORAL
  Filled 2023-09-02 (×3): qty 1

## 2023-09-02 MED ORDER — PANTOPRAZOLE SODIUM 40 MG PO TBEC
40.0000 mg | DELAYED_RELEASE_TABLET | Freq: Every day | ORAL | Status: DC
  Administered 2023-09-02 – 2023-09-03 (×2): 40 mg via ORAL
  Filled 2023-09-02 (×2): qty 1

## 2023-09-02 MED ORDER — CARVEDILOL 12.5 MG PO TABS
12.5000 mg | ORAL_TABLET | Freq: Two times a day (BID) | ORAL | Status: DC
  Administered 2023-09-02 – 2023-09-03 (×2): 12.5 mg via ORAL
  Filled 2023-09-02 (×3): qty 1

## 2023-09-02 MED ORDER — LOPERAMIDE HCL 2 MG PO CAPS
4.0000 mg | ORAL_CAPSULE | Freq: Once | ORAL | Status: AC
  Administered 2023-09-02: 4 mg via ORAL
  Filled 2023-09-02: qty 2

## 2023-09-02 MED ORDER — DM-GUAIFENESIN ER 30-600 MG PO TB12
1.0000 | ORAL_TABLET | Freq: Two times a day (BID) | ORAL | Status: DC
  Administered 2023-09-02 – 2023-09-03 (×3): 1 via ORAL
  Filled 2023-09-02 (×3): qty 1

## 2023-09-02 MED ORDER — AMLODIPINE BESYLATE 5 MG PO TABS: 10.0000 mg | ORAL_TABLET | Freq: Every day | ORAL | Status: DC

## 2023-09-02 MED ORDER — IPRATROPIUM-ALBUTEROL 0.5-2.5 (3) MG/3ML IN SOLN
3.0000 mL | Freq: Three times a day (TID) | RESPIRATORY_TRACT | Status: DC
Start: 2023-09-03 — End: 2023-09-03
  Administered 2023-09-03 (×2): 3 mL via RESPIRATORY_TRACT
  Filled 2023-09-02 (×2): qty 3

## 2023-09-02 MED ORDER — SERTRALINE HCL 50 MG PO TABS
100.0000 mg | ORAL_TABLET | Freq: Every day | ORAL | Status: DC
  Administered 2023-09-02 – 2023-09-03 (×2): 100 mg via ORAL
  Filled 2023-09-02 (×3): qty 2

## 2023-09-02 MED ORDER — ENOXAPARIN SODIUM 60 MG/0.6ML IJ SOSY
60.0000 mg | PREFILLED_SYRINGE | INTRAMUSCULAR | Status: DC
  Administered 2023-09-02 – 2023-09-03 (×2): 60 mg via SUBCUTANEOUS
  Filled 2023-09-02 (×2): qty 0.6

## 2023-09-02 MED ORDER — BENAZEPRIL HCL 20 MG PO TABS
20.0000 mg | ORAL_TABLET | Freq: Every day | ORAL | Status: DC
Start: 2023-09-02 — End: 2023-09-02

## 2023-09-02 MED ORDER — OSELTAMIVIR PHOSPHATE 75 MG PO CAPS
75.0000 mg | ORAL_CAPSULE | Freq: Once | ORAL | Status: AC
  Administered 2023-09-02: 75 mg via ORAL
  Filled 2023-09-02: qty 1

## 2023-09-02 MED ORDER — ALBUTEROL SULFATE (2.5 MG/3ML) 0.083% IN NEBU
2.5000 mg | INHALATION_SOLUTION | RESPIRATORY_TRACT | Status: DC | PRN
  Administered 2023-09-03: 2.5 mg via RESPIRATORY_TRACT
  Filled 2023-09-02: qty 3

## 2023-09-02 MED ORDER — ANASTROZOLE 1 MG PO TABS
1.0000 mg | ORAL_TABLET | Freq: Every day | ORAL | Status: DC
  Administered 2023-09-02 – 2023-09-03 (×2): 1 mg via ORAL
  Filled 2023-09-02 (×5): qty 1

## 2023-09-02 MED ORDER — IPRATROPIUM-ALBUTEROL 0.5-2.5 (3) MG/3ML IN SOLN
3.0000 mL | Freq: Once | RESPIRATORY_TRACT | Status: AC
  Administered 2023-09-02: 3 mL via RESPIRATORY_TRACT
  Filled 2023-09-02: qty 3

## 2023-09-02 MED ORDER — OSELTAMIVIR PHOSPHATE 75 MG PO CAPS
75.0000 mg | ORAL_CAPSULE | Freq: Two times a day (BID) | ORAL | Status: DC
  Administered 2023-09-02 – 2023-09-03 (×3): 75 mg via ORAL
  Filled 2023-09-02 (×3): qty 1

## 2023-09-02 MED ORDER — IPRATROPIUM-ALBUTEROL 0.5-2.5 (3) MG/3ML IN SOLN
3.0000 mL | Freq: Four times a day (QID) | RESPIRATORY_TRACT | Status: DC
  Administered 2023-09-02 (×3): 3 mL via RESPIRATORY_TRACT
  Filled 2023-09-02 (×3): qty 3

## 2023-09-02 MED ORDER — IPRATROPIUM-ALBUTEROL 0.5-2.5 (3) MG/3ML IN SOLN: 3.0000 mL | RESPIRATORY_TRACT | Status: DC | PRN

## 2023-09-02 MED ORDER — METHYLPREDNISOLONE SODIUM SUCC 40 MG IJ SOLR
40.0000 mg | Freq: Two times a day (BID) | INTRAMUSCULAR | Status: DC
  Administered 2023-09-02 – 2023-09-03 (×3): 40 mg via INTRAVENOUS
  Filled 2023-09-02 (×2): qty 1

## 2023-09-02 MED ORDER — GABAPENTIN 400 MG PO CAPS
400.0000 mg | ORAL_CAPSULE | Freq: Three times a day (TID) | ORAL | Status: DC
  Filled 2023-09-02 (×4): qty 1

## 2023-09-02 MED ORDER — NYSTATIN 100000 UNIT/GM EX POWD
Freq: Two times a day (BID) | CUTANEOUS | Status: DC
  Filled 2023-09-02: qty 15

## 2023-09-02 MED ORDER — ALPRAZOLAM 1 MG PO TABS
1.0000 mg | ORAL_TABLET | Freq: Two times a day (BID) | ORAL | Status: DC | PRN
  Administered 2023-09-03: 1 mg via ORAL
  Filled 2023-09-02: qty 1

## 2023-09-02 MED ORDER — PRAVASTATIN SODIUM 40 MG PO TABS
40.0000 mg | ORAL_TABLET | Freq: Every day | ORAL | Status: DC
  Administered 2023-09-02: 40 mg via ORAL
  Filled 2023-09-02: qty 1

## 2023-09-02 MED ORDER — IOHEXOL 350 MG/ML SOLN
75.0000 mL | Freq: Once | INTRAVENOUS | Status: AC | PRN
  Administered 2023-09-02: 75 mL via INTRAVENOUS

## 2023-09-02 MED ORDER — GUAIFENESIN-DM 100-10 MG/5ML PO SYRP
5.0000 mL | ORAL_SOLUTION | ORAL | Status: DC | PRN
  Administered 2023-09-02 – 2023-09-03 (×4): 5 mL via ORAL
  Filled 2023-09-02 (×4): qty 5

## 2023-09-02 MED ORDER — ALBUTEROL SULFATE (2.5 MG/3ML) 0.083% IN NEBU: 3.0000 mL | INHALATION_SOLUTION | Freq: Three times a day (TID) | RESPIRATORY_TRACT | Status: DC

## 2023-09-02 NOTE — H&P (Signed)
 History and Physical    Patient: Claudia [name] FMW:[id number] DOB: 09-Feb-1956 DOA: 09/01/2023 DOS: the patient was seen and examined on 09/02/2023 PCP: Dyane Carlyon Norris, FNP  Patient coming from: Home  Chief Complaint:  Chief Complaint  Patient presents with   Low oxygen   HPI: Claudia [name] is a 68 y.o. female with medical history significant of metastatic breast cancer on immunosuppressive therapy followed closely by oncology who presents to the emergency department due to low level of oxygen at home.  Patient presented to the ED 5 days ago (08/28/2023) due to shortness of breath and nonproductive cough, apparently she was exposed to some grandchildren who were sick with coughs and colds, and she was diagnosed to have pneumonia, breathing treatment was given in the ED, Solu-Medrol  125 mg x 1 was given and patient was discharged home on Augmentin  and Zithromax  Z-Pak.  Patient states that she was compliant with these. She complained of feeling lightheaded with weakness, general malaise.  She checked her O2 sat and noted it to be at 85% and consistently in the 80s, so she called her doctor at [facility] and she was told to go to the nearest emergency department.  Patient endorsed cough with occasional white sputum and sometimes the sputum is greenish-yellow.  She denies shortness of breath or fever.  ED Course:  In the emergency department, patient was hemodynamically stable.  Workup in the ED showed normal CBC, and BMP except for creatinine of 1.39.  Troponin x 2 was flat at 7, BNP 14.0.  influenza A was positive, influenza B, SARS coronavirus 2, RSV was negative. CT angiography chest with contrast showed no evidence of  arterial embolus through the segmental level in the lower lung fields. The subsegmental vessels in the lower zones are obscured by breathing motion. No embolus is seen in the upper lung fields. Chest x-ray showed no active cardiopulmonary disease Breathing treatment was  provided, Tamiflu  was given. Hospitalist was asked to admit patient for further evaluation and management.   Review of Systems: Review of systems as noted in the HPI. All other systems reviewed and are negative.   Past Medical History:  Diagnosis Date   Anxiety    Breast cancer (HCC)    Cancer of right breast (HCC)    mastectomy   Depression    GERD (gastroesophageal reflux disease)    Hiatal hernia    Hypercholesterolemia    Hypertension    Irritable bowel    PONV (postoperative nausea and vomiting)    Past Surgical History:  Procedure Laterality Date   ABDOMINAL HYSTERECTOMY     BREAST SURGERY     lumpectomy   CHOLECYSTECTOMY     FOOT SURGERY     bilateral spur removal   HARDWARE REMOVAL Left 05/29/2014   Procedure: REMOVAL OF SCREW AND K-WIRE;  Surgeon: Lemond Stable, MD;  Location: [facility];  Service: Orthopedics;  Laterality: Left;   JOINT REPLACEMENT Left 2017   Ankle   MASTECTOMY     ORIF ANKLE FRACTURE Left 10/10/2013   Procedure: OPEN REDUCTION INTERNAL FIXATION (ORIF) ANKLE FRACTURE;  Surgeon: Lemond Stable, MD;  Location: [facility];  Service: Orthopedics;  Laterality: Left;   ORIF ANKLE FRACTURE Left 05/29/2014   Procedure: OPEN REDUCTION INTERNAL FIXATION (ORIF) ANKLE FRACTURE AND PLACEMENT OF BONE GRAFT MATERIAL;  Surgeon: Lemond Stable, MD;  Location: [facility];  Service: Orthopedics;  Laterality: Left;   TONSILLECTOMY     TOTAL ABDOMINAL HYSTERECTOMY W/ BILATERAL SALPINGOOPHORECTOMY  TOTAL KNEE ARTHROPLASTY Left 09/20/2017   Procedure: LEFT TOTAL KNEE ARTHROPLASTY;  Surgeon: Barbarann Oneil BROCKS, MD;  Location: [facility];  Service: Orthopedics;  Laterality: Left;   TUBAL LIGATION      Social History:  reports that she quit smoking about 5 years ago. Her smoking use included cigarettes. She started smoking about 45 years ago. She has a 10 pack-year smoking history. She has never used smokeless tobacco. She reports that she does not drink alcohol and does not use  drugs.   Allergies  Allergen Reactions   Statins Other (See Comments)    Muscle pain    Family History  Problem Relation Age of Onset   Liver disease Mother    Hypertension Father    Heart attack Sister      Prior to Admission medications   Medication Sig Start Date End Date Taking? Authorizing Provider  albuterol  (VENTOLIN  HFA) 108 (90 Base) MCG/ACT inhaler Inhale 2 puffs into the lungs 3 (three) times daily. 08/01/20   Antoinette Doe, MD  alprazolam  (XANAX ) 2 MG tablet Take 2 mg by mouth 4 (four) times daily.    [provider]  amLODipine -benazepril  (LOTREL) 10-20 MG capsule Take 1 capsule by mouth daily. Start in 3 days 08/15/20   Elgergawy, Dawood S, MD  amoxicillin -clavulanate (AUGMENTIN ) 875-125 MG tablet Take 1 tablet by mouth every 12 (twelve) hours. 08/28/23   Cleotilde Rogue, MD  anastrozole  (ARIMIDEX ) 1 MG tablet Take 1 tablet by mouth daily. 07/17/20   [provider]  azithromycin  (ZITHROMAX  Z-PAK) 250 MG tablet Take 1 tablet (250 mg total) by mouth daily. 500mg  PO day 1, then 250mg  PO days 205 08/28/23   Cleotilde Rogue, MD  cetirizine (ZYRTEC) 10 MG tablet Take 10 mg by mouth daily.    [provider]  ezetimibe  (ZETIA ) 10 MG tablet Take 1 tablet (10 mg total) by mouth daily. 09/02/12   Jacquetta Sharlot GRADE, NP  gabapentin  (NEURONTIN ) 100 MG capsule Take 200 mg by mouth 3 (three) times daily as needed (burning/tingling). 05/07/20   [provider]  guaiFENesin -dextromethorphan  (ROBITUSSIN DM) 100-10 MG/5ML syrup Take 10 mLs by mouth every 4 (four) hours as needed for cough. 08/01/20   Antoinette Doe, MD  HYDROcodone -acetaminophen  (NORCO) 5-325 MG tablet Take 1 tablet by mouth at bedtime as needed for moderate pain. 10/14/17   Barbarann Oneil BROCKS, MD  ondansetron  (ZOFRAN ) 4 MG tablet Take 1 tablet (4 mg total) by mouth every 6 (six) hours. 07/10/21   Rayford Hedge, MD  sertraline  (ZOLOFT ) 100 MG tablet Take 250 mg by mouth daily.  09/02/12   Jacquetta Sharlot GRADE,  NP    Physical Exam: BP 122/70 (BP Location: Left Arm)   Pulse 72   Temp 98.5 F (36.9 C) (Oral)   Resp (!) 24   Ht 5' 4 (1.626 m)   Wt 117 kg   SpO2 94%   BMI 44.27 kg/m   General: 68 y.o. year-old female well developed well nourished in no acute distress.  Alert and oriented x3. HEENT: NCAT, EOMI Neck: Supple, trachea medial Cardiovascular: Regular rate and rhythm with no rubs or gallops.  No thyromegaly or JVD noted.  No lower extremity edema. 2/4 pulses in all 4 extremities. Respiratory: Diffuse expiratory wheezes and rhonchi on auscultation.   Abdomen: Soft, nontender nondistended with normal bowel sounds x4 quadrants. Muskuloskeletal: No cyanosis, clubbing or edema noted bilaterally Neuro: CN II-XII intact, strength 5/5 x 4, sensation, reflexes intact Skin: No ulcerative lesions noted or rashes  Psychiatry: Judgement and insight appear normal. Mood is appropriate for condition and setting          Labs on Admission:  Basic Metabolic Panel: Recent Labs  Lab 08/28/23 2239 09/01/23 2322  NA 136 135  K 4.0 4.0  CL 104 102  CO2 24 25  GLUCOSE 107* 88  BUN 15 21  CREATININE 0.96 1.39*  CALCIUM 8.5* 8.6*   Liver Function Tests: Recent Labs  Lab 08/28/23 2239  AST 21  ALT 19  ALKPHOS 67  BILITOT 0.5  PROT 7.2  ALBUMIN 3.3*   No results for input(s): LIPASE, AMYLASE in the last 168 hours. No results for input(s): AMMONIA in the last 168 hours. CBC: Recent Labs  Lab 08/28/23 2239 09/01/23 2322  WBC 6.7 6.6  NEUTROABS 5.5  --   HGB 11.0* 12.1  HCT 36.0 37.9  MCV 89.8 89.2  PLT 158 159   Cardiac Enzymes: No results for input(s): CKTOTAL, CKMB, CKMBINDEX, TROPONINI in the last 168 hours.  BNP (last 3 results) Recent Labs    08/28/23 2239 09/01/23 2322  BNP 297.0* 14.0    ProBNP (last 3 results) No results for input(s): PROBNP in the last 8760 hours.  CBG: No results for input(s): GLUCAP in the last 168  hours.  Radiological Exams on Admission: CT Angio Chest Pulmonary Embolism (PE) W or WO Contrast Result Date: 09/02/2023 CLINICAL DATA:  Hypoxia with increasing oxygen requirements. Recently diagnosed with pneumonia. EXAM: CT ANGIOGRAPHY CHEST WITH CONTRAST TECHNIQUE: Multidetector CT imaging of the chest was performed using the standard protocol during bolus administration of intravenous contrast. Multiplanar CT image reconstructions and MIPs were obtained to evaluate the vascular anatomy. RADIATION DOSE REDUCTION: This exam was performed according to the departmental dose-optimization program which includes automated exposure control, adjustment of the mA and/or kV according to patient size and/or use of iterative reconstruction technique. CONTRAST:  75mL OMNIPAQUE  IOHEXOL  350 MG/ML SOLN COMPARISON:  PA and lateral chest yesterday, portable chest 08/28/2023, PA and lateral chest 02/16/2022, CTA chest 08/04/2020. FINDINGS: Cardiovascular: There is a left chest port with IJ approach catheter again terminating at the superior cavoatrial junction. The heart is slightly enlarged. There are scattered three-vessel coronary artery calcifications. The aorta is normal in course and caliber with mild calcific plaques, with occasional calcifications in the great vessels. There is no aneurysm, dissection or stenosis. The pulmonary veins are normal caliber. The pulmonary arteries are again upper limit of normal in caliber. No embolus is seen in the upper lung zones. No arterial embolus is seen through the segmental level in the lower lung fields. The subsegmental vessels in the lower zones are obscured by breathing motion. Mediastinum/Nodes: Stable slightly prominent subcarinal lymph node, 1.1 cm in short axis. Thyroid  gland is not well seen due to streak artifacts from the MediPort but there is no obvious mass. There are old right axillary surgical clips. No axillary, hilar or further mediastinal adenopathy is seen. The  thoracic trachea, main bronchi and thoracic esophagus are unremarkable. There is a small hiatal hernia. Lungs/Pleura: There are patchy areas of subpleural reticulation in both lungs in previous locations where there was peripheral-predominant pneumonic infiltrate on the prior CTA. Findings are consistent with post pneumonic scarring. There is interval increased diffuse bronchial thickening. No focal pneumonia is evident but there is mosaicism in the lower lobes compatible with small airways disease with air trapping. There is asymmetric chronic elevation right hemidiaphragm. No nodules are seen through the breathing motion and there is no  pleural effusion. Upper Abdomen: No acute abnormality. Hepatic steatosis. Abdominal aortic atherosclerosis. Musculoskeletal: Old right mastectomy, implant reconstruction and axillary nodal dissection. The visualized chest wall without focal mass. There is advanced thoracic spondylosis. No regional bone metastasis is seen. Review of the MIP images confirms the above findings. IMPRESSION: 1. No evidence of arterial embolus through the segmental level in the lower lung fields. The subsegmental vessels in the lower zones are obscured by breathing motion. No embolus is seen in the upper lung fields. 2. Aortic and coronary artery atherosclerosis. 3. Slight cardiomegaly. 4. Patchy areas of subpleural reticulation in both lungs in previous locations where there was peripheral-predominant pneumonic infiltrate on the prior CTA. Findings are consistent with post pneumonic scarring. 5. 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 6. Hepatic steatosis. 7. Old right mastectomy, implant reconstruction and axillary nodal dissection. 8. Small hiatal hernia. Aortic Atherosclerosis (ICD10-I70.0). Electronically Signed   By: Francis Quam M.D.   On: 09/02/2023 01:56   DG Chest 2 View Result Date:  09/01/2023 CLINICAL DATA:  Cough EXAM: CHEST - 2 VIEW COMPARISON:  Chest x-ray 08/28/2023. FINDINGS: Left chest port catheter tip projects over the distal SVC. There is no focal lung infiltrate, pleural effusion or pneumothorax. The cardiomediastinal silhouette is within normal limits. Right axillary surgical clips are present. No acute fractures are seen. IMPRESSION: No active cardiopulmonary disease. Electronically Signed   By: Greig Pique M.D.   On: 09/01/2023 20:30    EKG: I independently viewed the EKG done and my findings are as followed: Normal sinus rhythm at a rate of 72 bpm  Assessment/Plan Present on Admission:  Influenza A  Acute respiratory failure with hypoxia (HCC)  Obesity, Class III, BMI 40-49.9 (morbid obesity) (HCC)  Principal Problem:   Influenza A Active Problems:   History of breast cancer   Acute respiratory failure with hypoxia (HCC)   Obesity, Class III, BMI 40-49.9 (morbid obesity) (HCC)   Essential hypertension   Mixed hyperlipidemia  Influenza A Acute respiratory failure with hypoxia in the setting of above Continue Tamiflu , Robitussin Continue duo nebs, Mucinex , Solu-Medrol , doxycycline . Continue Protonix  to prevent steroid-induced ulcer Continue incentive spirometry and flutter valve Continue supplemental oxygen to maintain O2 sat > 92% with plan to wean patient off oxygen as tolerated  Morbid obesity (BMI 44.27) Patient was counseled about the cardiovascular and metabolic risk of morbid obesity. Patient was counseled for diet control, exercise regimen and weight loss.   Essential hypertension Continue amlodipine , benazepril   Mixed hyperlipidemia Continue Zetia   History of metastatic breast cancer Continue anastrozole   DVT prophylaxis: Lovenox   Code Status: Full code  Family Communication: None at bedside  Consults: None  Severity of Illness: The appropriate patient status for this patient is INPATIENT. Inpatient status is judged to be  reasonable and necessary in order to provide the required intensity of service to ensure the patient's safety. The patient's presenting symptoms, physical exam findings, and initial radiographic and laboratory data in the context of their chronic comorbidities is felt to place them at high risk for further clinical deterioration. Furthermore, it is not anticipated that the patient will be medically stable for discharge from the hospital within 2 midnights of admission.   * I certify that at the point of admission it is my clinical judgment that the patient will require inpatient hospital care spanning beyond 2 midnights from the point of admission due to high intensity of service, high risk  for further deterioration and high frequency of surveillance required.*  Author: Minor Iden, DO 09/02/2023 7:00 AM  For on call review [email or web address].

## 2023-09-02 NOTE — ED Notes (Signed)
 Pt placed on 2L [place] per Dr. Harless Lien.

## 2023-09-02 NOTE — ED Notes (Signed)
 Pt needs minimal assistance with getting up to go to the BR located in her room. Pt had a medium sized BM

## 2023-09-02 NOTE — ED Notes (Addendum)
 Pt provided non-slip socks and assisted to the [facility]. RN remained at the bedside for assistance.

## 2023-09-02 NOTE — ED Notes (Signed)
 Patient transported to CT

## 2023-09-02 NOTE — TOC CM/SW Note (Signed)
 Transition of Care [facility]) - Inpatient Brief Assessment   Patient Details  Name: Claudia [name] MRN: [id number] Date of Birth: Nov 14, 1955  Transition of Care [facility]) CM/SW Contact:    Lucie Lunger, LCSWA Phone Number: 09/02/2023, 11:45 AM   Clinical Narrative: Tranisition of Care Department [facility]) has reviewed patient and no TOC needs have been ifentified at this time. We will continue to monitor patient advancement through interdisciplinary progression rounds. If new patient transition needs arise, please place a TOC consult.   Transition of Care Asessment: Insurance and Status: Insurance coverage has been reviewed Patient has primary care physician: Yes Home environment has been reviewed: From home Prior level of function:: Independent Prior/Current Home Services: No current home services Social Drivers of Health Review: SDOH reviewed no interventions necessary Readmission risk has been reviewed: Yes Transition of care needs: no transition of care needs at this time

## 2023-09-02 NOTE — Progress Notes (Signed)
 Patient seen and evaluated, chart reviewed, please see EMR for updated orders. Please see full H&P dictated by admitting physician Dr Adefeso for same date of service.   Brief Summary:-  68 y.o. female with medical history significant of metastatic breast cancer on immunosuppressive therapy admitted on 09/02/2023 with acute hypoxic respiratory failure due to influenza A infection  A/p 1) influenza A infection--continue Tamiflu , Solu-Medrol  bronchodilators and mucolytics CTA Chest shows Findings are consistent with post pneumonic scarring.  2) acute hypoxic respiratory failure--- due to #1 above -Currently requiring 3 L of oxygen -Continue to try to wean off O2  3)HTN--stable, continue amlodipine   4)Morbid Obesity- -Low calorie diet, portion control and increase physical activity discussed with patient -Body mass index is 44.27 kg/m.  Mixed hyperlipidemia Continue Zetia    History of metastatic breast cancer Continue anastrozole   --- - -Cough dyspnea and hypoxia persist -Total care time 37 minutes  Rendall Carwin, MD

## 2023-09-03 DIAGNOSIS — J101 Influenza due to other identified influenza virus with other respiratory manifestations: Secondary | ICD-10-CM | POA: Diagnosis not present

## 2023-09-03 LAB — PHOSPHORUS: Phosphorus: 2.1 mg/dL — ABNORMAL LOW (ref 2.5–4.6)

## 2023-09-03 MED ORDER — POTASSIUM PHOSPHATES 15 MMOLE/5ML IV SOLN
15.0000 mmol | Freq: Once | INTRAVENOUS | Status: AC
  Administered 2023-09-03: 15 mmol via INTRAVENOUS
  Filled 2023-09-03: qty 5

## 2023-09-03 MED ORDER — DOXYCYCLINE HYCLATE 100 MG PO TABS: 100.0000 mg | ORAL_TABLET | Freq: Two times a day (BID) | ORAL | 0 refills | Status: AC

## 2023-09-03 MED ORDER — ALBUTEROL SULFATE HFA 108 (90 BASE) MCG/ACT IN AERS: 2.0000 | INHALATION_SPRAY | Freq: Three times a day (TID) | RESPIRATORY_TRACT | 3 refills | Status: DC

## 2023-09-03 MED ORDER — OSELTAMIVIR PHOSPHATE 75 MG PO CAPS: 75.0000 mg | ORAL_CAPSULE | Freq: Two times a day (BID) | ORAL | 0 refills | Status: AC

## 2023-09-03 MED ORDER — POTASSIUM CHLORIDE CRYS ER 20 MEQ PO TBCR
40.0000 meq | EXTENDED_RELEASE_TABLET | Freq: Once | ORAL | Status: AC
  Administered 2023-09-03: 40 meq via ORAL
  Filled 2023-09-03: qty 2

## 2023-09-03 MED ORDER — OSELTAMIVIR PHOSPHATE 75 MG PO CAPS: 75.0000 mg | ORAL_CAPSULE | Freq: Two times a day (BID) | ORAL | Status: DC

## 2023-09-03 MED ORDER — PANTOPRAZOLE SODIUM 40 MG PO TBEC: 40.0000 mg | DELAYED_RELEASE_TABLET | Freq: Every day | ORAL | 1 refills | Status: DC

## 2023-09-03 MED ORDER — NYSTATIN 100000 UNIT/GM EX CREA: TOPICAL_CREAM | Freq: Two times a day (BID) | CUTANEOUS | 2 refills | Status: DC

## 2023-09-03 MED ORDER — GUAIFENESIN ER 600 MG PO TB12: 600.0000 mg | ORAL_TABLET | Freq: Two times a day (BID) | ORAL | 2 refills | Status: DC

## 2023-09-03 MED ORDER — PREDNISONE 20 MG PO TABS: 40.0000 mg | ORAL_TABLET | Freq: Every day | ORAL | 0 refills | Status: AC

## 2023-09-03 NOTE — Plan of Care (Signed)

## 2023-09-03 NOTE — Progress Notes (Addendum)
 Pt w/anxiety and found fixating on her personal pulse ox on finger AAT when entering room. Pt placed on room air this AM and sats found to be 95-97% at rest on room air. Walked pt this AM on a longer walk with sats 90-93% with pt exhibiting DOE and activity intolerance. Recommended pt to walk short distances and not to overdo it due to DOE. Pt has her own pulse ox and utilized call bell several times this AM when her O2 dropped down to 88% per pt report at rest while eating breakfast. Pt with no SOB visualized and pt denied feeling SOB stating [facility] told me that my oxygen dropping below 90% is life threatening. This RN stated O2 is expected to drop slightly with certain activities such as eating and ambulation. RN offered pt to use callbell if she experiences SOB at rest or activity and offered RN can recheck O2. Educated pt on incentive spirometer use. Pt verbalized understanding.

## 2023-09-03 NOTE — Discharge Summary (Signed)
 Claudia [name], is a 68 y.o. female  DOB 1956/02/07  MRN [id number].  Admission date:  09/01/2023  Admitting Physician  Posey Maier, DO  Discharge Date:  09/03/2023   Primary MD  Dyane Carlyon Norris, FNP  Recommendations for primary care physician for things to follow:  1)Avoid ibuprofen/Advil/Aleve/Motrin/Goody Powders/Naproxen/BC powders/Meloxicam/Diclofenac/Indomethacin and other Nonsteroidal anti-inflammatory medications as these will make you more likely to bleed and can cause stomach ulcers, can also cause Kidney problems.   2)Follow up with your Oncologist at [facility] as previously scheduled  3)Please note that there has been changes to your medications--  Admission Diagnosis  Influenza A [J10.1] Hypoxia [R09.02]   Discharge Diagnosis  Influenza A [J10.1] Hypoxia [R09.02]    Principal Problem:   Influenza A Active Problems:   History of breast cancer   Acute respiratory failure with hypoxia (HCC)   Obesity, Class III, BMI 40-49.9 (morbid obesity) (HCC)   Essential hypertension   Mixed hyperlipidemia      Past Medical History:  Diagnosis Date   Anxiety    Breast cancer (HCC)    Cancer of right breast (HCC)    mastectomy   Depression    GERD (gastroesophageal reflux disease)    Hiatal hernia    Hypercholesterolemia    Hypertension    Irritable bowel    PONV (postoperative nausea and vomiting)     Past Surgical History:  Procedure Laterality Date   ABDOMINAL HYSTERECTOMY     BREAST SURGERY     lumpectomy   CHOLECYSTECTOMY     FOOT SURGERY     bilateral spur removal   HARDWARE REMOVAL Left 05/29/2014   Procedure: REMOVAL OF SCREW AND K-WIRE;  Surgeon: Lemond Stable, MD;  Location: [facility];  Service: Orthopedics;  Laterality: Left;   JOINT REPLACEMENT Left 2017   Ankle   MASTECTOMY     ORIF ANKLE FRACTURE Left 10/10/2013   Procedure: OPEN REDUCTION INTERNAL FIXATION (ORIF)  ANKLE FRACTURE;  Surgeon: Lemond Stable, MD;  Location: [facility];  Service: Orthopedics;  Laterality: Left;   ORIF ANKLE FRACTURE Left 05/29/2014   Procedure: OPEN REDUCTION INTERNAL FIXATION (ORIF) ANKLE FRACTURE AND PLACEMENT OF BONE GRAFT MATERIAL;  Surgeon: Lemond Stable, MD;  Location: [facility];  Service: Orthopedics;  Laterality: Left;   TONSILLECTOMY     TOTAL ABDOMINAL HYSTERECTOMY W/ BILATERAL SALPINGOOPHORECTOMY     TOTAL KNEE ARTHROPLASTY Left 09/20/2017   Procedure: LEFT TOTAL KNEE ARTHROPLASTY;  Surgeon: Barbarann Oneil BROCKS, MD;  Location: [facility];  Service: Orthopedics;  Laterality: Left;   TUBAL LIGATION       HPI  from the history and physical done on the day of admission:   HPI: Claudia [name] is a 68 y.o. female with medical history significant of metastatic breast cancer on immunosuppressive therapy followed closely by oncology who presents to the emergency department due to low level of oxygen at home.  Patient presented to the ED 5 days ago (08/28/2023) due to shortness of breath and nonproductive cough, apparently she was exposed to some  grandchildren who were sick with coughs and colds, and she was diagnosed to have pneumonia, breathing treatment was given in the ED, Solu-Medrol  125 mg x 1 was given and patient was discharged home on Augmentin  and Zithromax  Z-Pak.  Patient states that she was compliant with these. She complained of feeling lightheaded with weakness, general malaise.  She checked her O2 sat and noted it to be at 85% and consistently in the 80s, so she called her doctor at [facility] and she was told to go to the nearest emergency department.  Patient endorsed cough with occasional white sputum and sometimes the sputum is greenish-yellow.  She denies shortness of breath or fever.   ED Course:  In the emergency department, patient was hemodynamically stable.  Workup in the ED showed normal CBC, and BMP except for creatinine of 1.39.  Troponin x 2 was flat at 7, BNP 14.0.  influenza  A was positive, influenza B, SARS coronavirus 2, RSV was negative. CT angiography chest with contrast showed no evidence of  arterial embolus through the segmental level in the lower lung fields. The subsegmental vessels in the lower zones are obscured by breathing motion. No embolus is seen in the upper lung fields. Chest x-ray showed no active cardiopulmonary disease Breathing treatment was provided, Tamiflu  was given. Hospitalist was asked to admit patient for further evaluation and management.    Review of Systems: Review of systems as noted in the HPI. All other systems reviewed and are negative.   Hospital Course:     Assessment and Plan: Brief Summary:-  68 y.o. female with medical history significant of metastatic breast cancer on immunosuppressive therapy admitted on 09/02/2023 with acute hypoxic respiratory failure due to influenza A infection   A/p 1)Influenza A infection--  Treated with Tamiflu , iv solu-medrol , bronchodilators and mucolytics much improved overall CTA Chest shows Findings are consistent with post pneumonic scarring, otherwise No acute findings Ok to dc home on prednisone  and Tamiflu    2) acute Hypoxic respiratory failure--- due to #1 above -on room air this AM and sats found to be 95-97% at rest on room air. Walked pt this AM on a longer walk with sats 90-93%  -weaned off oxygen completely Ok to complete Doxycycline    3)HTN--stable, continue coreg    4)Morbid Obesity- -Low calorie diet, portion control and increase physical activity discussed with patient -Body mass index is 44.27 kg/m.   5)Mixed Hyperlipidemia Continue Zetia  and pravastatin    6)History of metastatic breast cancer Continue anastrozole   7)GERD--- c/n Protonix  , especially while on steroids  8)Depression/Anxiety--stable, continue PTA  Zoloft  and Xanax   9)Hypophosphatemia/Hypokalemia-----replaced    -Discharge Condition: stable   Follow UP   Follow-up Information     Dyane Carlyon Norris, FNP. Schedule an appointment as soon as possible for a visit.   Specialty: Family Medicine Contact information: [address] [place] [address] [phone]                 Diet and Activity recommendation:  As advised  Discharge Instructions    Discharge Instructions     Call MD for:  difficulty breathing, headache or visual disturbances   Complete by: As directed    Call MD for:  persistant dizziness or light-headedness   Complete by: As directed    Call MD for:  persistant nausea and vomiting   Complete by: As directed    Call MD for:  temperature >100.4   Complete by: As directed    Diet - low sodium  heart healthy   Complete by: As directed    Discharge instructions   Complete by: As directed    1)Avoid ibuprofen/Advil/Aleve/Motrin/Goody Powders/Naproxen/BC powders/Meloxicam/Diclofenac/Indomethacin and other Nonsteroidal anti-inflammatory medications as these will make you more likely to bleed and can cause stomach ulcers, can also cause Kidney problems.   2)Follow up with your Oncologist at [facility] as previously scheduled  3)Please note that there has been changes to your medications--   Increase activity slowly   Complete by: As directed        Discharge Medications     Allergies as of 09/03/2023       Reactions   Statins Other (See Comments)   Muscle pain        Medication List     STOP taking these medications    amoxicillin -clavulanate 875-125 MG tablet Commonly known as: AUGMENTIN    azithromycin  250 MG tablet Commonly known as: Zithromax  Z-Pak       TAKE these medications    albuterol  108 (90 Base) MCG/ACT inhaler Commonly known as: VENTOLIN  HFA Inhale 2 puffs into the lungs 3 (three) times daily.   alprazolam  2 MG tablet Commonly known as: XANAX  Take 2 mg by mouth 3 (three) times daily as needed for anxiety.   anastrozole  1 MG tablet Commonly known as: ARIMIDEX  Take 1 tablet by mouth daily.   carvedilol   12.5 MG tablet Commonly known as: COREG  Take 12.5 mg by mouth 2 (two) times daily.   doxycycline  100 MG tablet Commonly known as: VIBRA -TABS Take 1 tablet (100 mg total) by mouth 2 (two) times daily for 5 days.   ezetimibe  10 MG tablet Commonly known as: ZETIA  Take 1 tablet (10 mg total) by mouth daily.   gabapentin  400 MG capsule Commonly known as: NEURONTIN  Take 400 mg by mouth 3 (three) times daily.   guaiFENesin  600 MG 12 hr tablet Commonly known as: Mucinex  Take 1 tablet (600 mg total) by mouth 2 (two) times daily.   guaiFENesin -dextromethorphan  100-10 MG/5ML syrup Commonly known as: ROBITUSSIN DM Take 10 mLs by mouth every 4 (four) hours as needed for cough.   naloxone  4 MG/0.1ML Liqd nasal spray kit Commonly known as: NARCAN  Place into the nose.   nystatin  cream Commonly known as: MYCOSTATIN  Apply topically.   ondansetron  4 MG disintegrating tablet Commonly known as: ZOFRAN -ODT Take by mouth.   oseltamivir  75 MG capsule Commonly known as: TAMIFLU  Take 1 capsule (75 mg total) by mouth 2 (two) times daily for 3 days.   pantoprazole  40 MG tablet Commonly known as: Protonix  Take 1 tablet (40 mg total) by mouth daily.   pravastatin  40 MG tablet Commonly known as: PRAVACHOL  Take 1 tablet by mouth at bedtime.   predniSONE  20 MG tablet Commonly known as: DELTASONE  Take 2 tablets (40 mg total) by mouth daily with breakfast for 5 days.   sertraline  100 MG tablet Commonly known as: ZOLOFT  Take 250 mg by mouth daily.       Major procedures and Radiology Reports - PLEASE review detailed and final reports for all details, in brief -   CT Angio Chest Pulmonary Embolism (PE) W or WO Contrast Result Date: 09/02/2023 CLINICAL DATA:  Hypoxia with increasing oxygen requirements. Recently diagnosed with pneumonia. EXAM: CT ANGIOGRAPHY CHEST WITH CONTRAST TECHNIQUE: Multidetector CT imaging of the chest was performed using the standard protocol during bolus  administration of intravenous contrast. Multiplanar CT image reconstructions and MIPs were obtained to evaluate the vascular anatomy. RADIATION DOSE REDUCTION: This exam was performed according  to the departmental dose-optimization program which includes automated exposure control, adjustment of the mA and/or kV according to patient size and/or use of iterative reconstruction technique. CONTRAST:  75mL OMNIPAQUE  IOHEXOL  350 MG/ML SOLN COMPARISON:  PA and lateral chest yesterday, portable chest 08/28/2023, PA and lateral chest 02/16/2022, CTA chest 08/04/2020. FINDINGS: Cardiovascular: There is a left chest port with IJ approach catheter again terminating at the superior cavoatrial junction. The heart is slightly enlarged. There are scattered three-vessel coronary artery calcifications. The aorta is normal in course and caliber with mild calcific plaques, with occasional calcifications in the great vessels. There is no aneurysm, dissection or stenosis. The pulmonary veins are normal caliber. The pulmonary arteries are again upper limit of normal in caliber. No embolus is seen in the upper lung zones. No arterial embolus is seen through the segmental level in the lower lung fields. The subsegmental vessels in the lower zones are obscured by breathing motion. Mediastinum/Nodes: Stable slightly prominent subcarinal lymph node, 1.1 cm in short axis. Thyroid  gland is not well seen due to streak artifacts from the MediPort but there is no obvious mass. There are old right axillary surgical clips. No axillary, hilar or further mediastinal adenopathy is seen. The thoracic trachea, main bronchi and thoracic esophagus are unremarkable. There is a small hiatal hernia. Lungs/Pleura: There are patchy areas of subpleural reticulation in both lungs in previous locations where there was peripheral-predominant pneumonic infiltrate on the prior CTA. Findings are consistent with post pneumonic scarring. There is interval increased  diffuse bronchial thickening. No focal pneumonia is evident but there is mosaicism in the lower lobes compatible with small airways disease with air trapping. There is asymmetric chronic elevation right hemidiaphragm. No nodules are seen through the breathing motion and there is no pleural effusion. Upper Abdomen: No acute abnormality. Hepatic steatosis. Abdominal aortic atherosclerosis. Musculoskeletal: Old right mastectomy, implant reconstruction and axillary nodal dissection. The visualized chest wall without focal mass. There is advanced thoracic spondylosis. No regional bone metastasis is seen. Review of the MIP images confirms the above findings. IMPRESSION: 1. No evidence of arterial embolus through the segmental level in the lower lung fields. The subsegmental vessels in the lower zones are obscured by breathing motion. No embolus is seen in the upper lung fields. 2. Aortic and coronary artery atherosclerosis. 3. Slight cardiomegaly. 4. Patchy areas of subpleural reticulation in both lungs in previous locations where there was peripheral-predominant pneumonic infiltrate on the prior CTA. Findings are consistent with post pneumonic scarring. 5. 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 6. Hepatic steatosis. 7. Old right mastectomy, implant reconstruction and axillary nodal dissection. 8. Small hiatal hernia. Aortic Atherosclerosis (ICD10-I70.0). Electronically Signed   By: Francis Quam M.D.   On: 09/02/2023 01:56   DG Chest 2 View Result Date: 09/01/2023 CLINICAL DATA:  Cough EXAM: CHEST - 2 VIEW COMPARISON:  Chest x-ray 08/28/2023. FINDINGS: Left chest port catheter tip projects over the distal SVC. There is no focal lung infiltrate, pleural effusion or pneumothorax. The cardiomediastinal silhouette is within normal limits. Right axillary surgical clips are present. No acute fractures are seen. IMPRESSION: No  active cardiopulmonary disease. Electronically Signed   By: Greig Pique M.D.   On: 09/01/2023 20:30   DG Chest Port 1 View Result Date: 08/28/2023 CLINICAL DATA:  Cough and shortness of breath. Congestion, vomiting, and fever starting yesterday. History of metastatic breast cancer. EXAM: PORTABLE CHEST 1 VIEW COMPARISON:  02/16/2022  FINDINGS: Left central venous catheter with tip over the cavoatrial junction region. Heart size and pulmonary vascularity are normal. Developing infiltration suggested over the right lung base with possible patchy left perihilar infiltrates. Changes likely to represent multifocal pneumonia. No pleural effusion or pneumothorax. Mediastinal contours appear intact. Surgical clips in the right axilla. IMPRESSION: Infiltrates are suggested in the right lung base and left perihilar region, probably representing multifocal pneumonia. Electronically Signed   By: Elsie Gravely M.D.   On: 08/28/2023 23:06    Micro Results  Recent Results (from the past 240 hours)  Resp panel by RT-PCR (RSV, Flu A&B, Covid) Anterior Nasal Swab     Status: Abnormal   Collection Time: 08/28/23 10:39 PM   Specimen: Anterior Nasal Swab  Result Value Ref Range Status   SARS Coronavirus 2 by RT PCR NEGATIVE NEGATIVE Final    Comment: (NOTE) SARS-CoV-2 target nucleic acids are NOT DETECTED.  The SARS-CoV-2 RNA is generally detectable in upper respiratory specimens during the acute phase of infection. The lowest concentration of SARS-CoV-2 viral copies this assay can detect is 138 copies/mL. A negative result does not preclude SARS-Cov-2 infection and should not be used as the sole basis for treatment or other patient management decisions. A negative result may occur with  improper specimen collection/handling, submission of specimen other than nasopharyngeal swab, presence of viral mutation(s) within the areas targeted by this assay, and inadequate number of viral copies(<138 copies/mL). A  negative result must be combined with clinical observations, patient history, and epidemiological information. The expected result is Negative.  Fact Sheet for Patients:  bloggercourse.com  Fact Sheet for Healthcare Providers:  seriousbroker.it  This test is no t yet approved or cleared by the [place]  FDA and  has been authorized for detection and/or diagnosis of SARS-CoV-2 by FDA under an Emergency Use Authorization (EUA). This EUA will remain  in effect (meaning this test can be used) for the duration of the COVID-19 declaration under Section 564(b)(1) of the Act, 21 U.S.C.section 360bbb-3(b)(1), unless the authorization is terminated  or revoked sooner.       Influenza A by PCR POSITIVE (A) NEGATIVE Final   Influenza B by PCR NEGATIVE NEGATIVE Final    Comment: (NOTE) The Xpert Xpress SARS-CoV-2/FLU/RSV plus assay is intended as an aid in the diagnosis of influenza from Nasopharyngeal swab specimens and should not be used as a sole basis for treatment. Nasal washings and aspirates are unacceptable for Xpert Xpress SARS-CoV-2/FLU/RSV testing.  Fact Sheet for Patients: bloggercourse.com  Fact Sheet for Healthcare Providers: seriousbroker.it  This test is not yet approved or cleared by the [place]  FDA and has been authorized for detection and/or diagnosis of SARS-CoV-2 by FDA under an Emergency Use Authorization (EUA). This EUA will remain in effect (meaning this test can be used) for the duration of the COVID-19 declaration under Section 564(b)(1) of the Act, 21 U.S.C. section 360bbb-3(b)(1), unless the authorization is terminated or revoked.     Resp Syncytial Virus by PCR NEGATIVE NEGATIVE Final    Comment: (NOTE) Fact Sheet for Patients: bloggercourse.com  Fact Sheet for Healthcare  Providers: seriousbroker.it  This test is not yet approved or cleared by the [place]  FDA and has been authorized for detection and/or diagnosis of SARS-CoV-2 by FDA under an Emergency Use Authorization (EUA). This EUA will remain in effect (meaning this test can be used) for the duration of the COVID-19 declaration under Section 564(b)(1) of the Act, 21 U.S.C. section 360bbb-3(b)(1), unless the authorization  is terminated or revoked.  Performed at [facility], [address], [place], [place] [address]   Resp panel by RT-PCR (RSV, Flu A&B, Covid) Anterior Nasal Swab     Status: Abnormal   Collection Time: 09/01/23 11:23 PM   Specimen: Anterior Nasal Swab  Result Value Ref Range Status   SARS Coronavirus 2 by RT PCR NEGATIVE NEGATIVE Final    Comment: (NOTE) SARS-CoV-2 target nucleic acids are NOT DETECTED.  The SARS-CoV-2 RNA is generally detectable in upper respiratory specimens during the acute phase of infection. The lowest concentration of SARS-CoV-2 viral copies this assay can detect is 138 copies/mL. A negative result does not preclude SARS-Cov-2 infection and should not be used as the sole basis for treatment or other patient management decisions. A negative result may occur with  improper specimen collection/handling, submission of specimen other than nasopharyngeal swab, presence of viral mutation(s) within the areas targeted by this assay, and inadequate number of viral copies(<138 copies/mL). A negative result must be combined with clinical observations, patient history, and epidemiological information. The expected result is Negative.  Fact Sheet for Patients:  bloggercourse.com  Fact Sheet for Healthcare Providers:  seriousbroker.it  This test is no t yet approved or cleared by the [place]  FDA and  has been authorized for detection and/or diagnosis of SARS-CoV-2 by FDA under an  Emergency Use Authorization (EUA). This EUA will remain  in effect (meaning this test can be used) for the duration of the COVID-19 declaration under Section 564(b)(1) of the Act, 21 U.S.C.section 360bbb-3(b)(1), unless the authorization is terminated  or revoked sooner.       Influenza A by PCR POSITIVE (A) NEGATIVE Final   Influenza B by PCR NEGATIVE NEGATIVE Final    Comment: (NOTE) The Xpert Xpress SARS-CoV-2/FLU/RSV plus assay is intended as an aid in the diagnosis of influenza from Nasopharyngeal swab specimens and should not be used as a sole basis for treatment. Nasal washings and aspirates are unacceptable for Xpert Xpress SARS-CoV-2/FLU/RSV testing.  Fact Sheet for Patients: bloggercourse.com  Fact Sheet for Healthcare Providers: seriousbroker.it  This test is not yet approved or cleared by the [place]  FDA and has been authorized for detection and/or diagnosis of SARS-CoV-2 by FDA under an Emergency Use Authorization (EUA). This EUA will remain in effect (meaning this test can be used) for the duration of the COVID-19 declaration under Section 564(b)(1) of the Act, 21 U.S.C. section 360bbb-3(b)(1), unless the authorization is terminated or revoked.     Resp Syncytial Virus by PCR NEGATIVE NEGATIVE Final    Comment: (NOTE) Fact Sheet for Patients: bloggercourse.com  Fact Sheet for Healthcare Providers: seriousbroker.it  This test is not yet approved or cleared by the [place]  FDA and has been authorized for detection and/or diagnosis of SARS-CoV-2 by FDA under an Emergency Use Authorization (EUA). This EUA will remain in effect (meaning this test can be used) for the duration of the COVID-19 declaration under Section 564(b)(1) of the Act, 21 U.S.C. section 360bbb-3(b)(1), unless the authorization is terminated or revoked.  Performed at [facility], [address], [place], [place] [address]    Today   Subjective    Claudia [name] today has no new complaints   Cough and dyspnea has arrived on room air this AM and sats found to be 95-97% at rest on room air. Walked pt this AM on a longer walk with sats 90-93%           Patient has been seen  and examined prior to discharge   Objective   Blood pressure (!) 147/73, pulse 79, temperature 97.6 F (36.4 C), temperature source Oral, resp. rate 18, height 5' 4 (1.626 m), weight 117 kg, SpO2 95%.   Intake/Output Summary (Last 24 hours) at 09/03/2023 1532 Last data filed at 09/03/2023 1000 Gross per 24 hour  Intake 240 ml  Output --  Net 240 ml   Exam Gen:- Awake Alert, no acute distress, no conversational dyspnea HEENT:- [place].AT, No sclera icterus Neck-Supple Neck,No JVD,.  Lungs-fair air movement, no wheezing, no rhonchi, no rales  CV- S1, S2 normal, regular Abd-  +ve B.Sounds, Abd Soft, No tenderness, increased truncal adiposity Extremity/Skin:- No  edema,   good pulses Psych-affect is appropriate, oriented x3 Neuro-no new focal deficits, no tremors    Data Review   CBC w Diff:  Lab Results  Component Value Date   WBC 5.8 09/02/2023   HGB 11.3 (L) 09/02/2023   HCT 36.9 09/02/2023   PLT 133 (L) 09/02/2023   LYMPHOPCT 11 08/28/2023   BANDSPCT 0 10/22/2012   MONOPCT 7 08/28/2023   EOSPCT 0 08/28/2023   BASOPCT 0 08/28/2023   CMP:  Lab Results  Component Value Date   NA 135 09/02/2023   K 3.4 (L) 09/02/2023   CL 101 09/02/2023   CO2 26 09/02/2023   BUN 22 09/02/2023   CREATININE 1.01 (H) 09/02/2023   PROT 6.8 09/02/2023   ALBUMIN 3.3 (L) 09/02/2023   BILITOT 0.8 09/02/2023   ALKPHOS 66 09/02/2023   AST 40 09/02/2023   ALT 33 09/02/2023   Total Discharge time is about 33 minutes  Rendall Carwin M.D on 09/03/2023 at 3:32 PM  Go to [email or web address] -  for contact info  [facility] - Office  [phone]

## 2023-09-03 NOTE — Discharge Instructions (Signed)
 1)Avoid ibuprofen/Advil/Aleve/Motrin/Goody Powders/Naproxen/BC powders/Meloxicam/Diclofenac/Indomethacin and other Nonsteroidal anti-inflammatory medications as these will make you more likely to bleed and can cause stomach ulcers, can also cause Kidney problems.   2)Follow up with your Oncologist at [facility] as previously scheduled  3)Please note that there has been changes to your medications--

## 2023-09-03 NOTE — Care Management Important Message (Signed)
 Important Message  Patient Details  Name: Claudia [name] MRN: [id number] Date of Birth: 1956/03/13   Important Message Given:  N/A - LOS <3 / Initial given by admissions     Neila Bally 09/03/2023, 9:58 AM

## 2023-09-03 NOTE — Plan of Care (Signed)
  Problem: Education: Goal: Knowledge of General Education information will improve Description: Including pain rating scale, medication(s)/side effects and non-pharmacologic comfort measures Outcome: Progressing   Problem: Health Behavior/Discharge Planning: Goal: Ability to manage health-related needs will improve Outcome: Progressing   Problem: Clinical Measurements: Goal: Respiratory complications will improve Outcome: Not Progressing

## 2024-01-08 ENCOUNTER — Encounter (HOSPITAL_COMMUNITY): Payer: Self-pay

## 2024-01-08 ENCOUNTER — Emergency Department (HOSPITAL_COMMUNITY)

## 2024-01-08 ENCOUNTER — Other Ambulatory Visit: Payer: Self-pay

## 2024-01-08 ENCOUNTER — Inpatient Hospital Stay (HOSPITAL_COMMUNITY)
Admission: EM | Admit: 2024-01-08 | Discharge: 2024-01-10 | DRG: 189 | Disposition: A | Discharge status: Home / Self Care | Attending: Family Medicine | Admitting: Family Medicine

## 2024-01-08 DIAGNOSIS — Z90710 Acquired absence of both cervix and uterus: Secondary | ICD-10-CM

## 2024-01-08 DIAGNOSIS — Z8249 Family history of ischemic heart disease and other diseases of the circulatory system: Secondary | ICD-10-CM

## 2024-01-08 DIAGNOSIS — Z87891 Personal history of nicotine dependence: Secondary | ICD-10-CM

## 2024-01-08 DIAGNOSIS — Z9011 Acquired absence of right breast and nipple: Secondary | ICD-10-CM

## 2024-01-08 DIAGNOSIS — C50911 Malignant neoplasm of unspecified site of right female breast: Secondary | ICD-10-CM | POA: Diagnosis present

## 2024-01-08 DIAGNOSIS — F419 Anxiety disorder, unspecified: Secondary | ICD-10-CM | POA: Diagnosis present

## 2024-01-08 DIAGNOSIS — E66813 Obesity, class 3: Secondary | ICD-10-CM | POA: Diagnosis present

## 2024-01-08 DIAGNOSIS — Z8616 Personal history of COVID-19: Secondary | ICD-10-CM

## 2024-01-08 DIAGNOSIS — Z79899 Other long term (current) drug therapy: Secondary | ICD-10-CM

## 2024-01-08 DIAGNOSIS — Z7960 Long term (current) use of unspecified immunomodulators and immunosuppressants: Secondary | ICD-10-CM

## 2024-01-08 DIAGNOSIS — J9601 Acute respiratory failure with hypoxia: Principal | ICD-10-CM | POA: Diagnosis present

## 2024-01-08 DIAGNOSIS — C7801 Secondary malignant neoplasm of right lung: Secondary | ICD-10-CM | POA: Diagnosis present

## 2024-01-08 DIAGNOSIS — Z1152 Encounter for screening for COVID-19: Secondary | ICD-10-CM

## 2024-01-08 DIAGNOSIS — I10 Essential (primary) hypertension: Secondary | ICD-10-CM | POA: Diagnosis present

## 2024-01-08 DIAGNOSIS — Z853 Personal history of malignant neoplasm of breast: Secondary | ICD-10-CM

## 2024-01-08 DIAGNOSIS — B372 Candidiasis of skin and nail: Secondary | ICD-10-CM | POA: Diagnosis present

## 2024-01-08 DIAGNOSIS — K219 Gastro-esophageal reflux disease without esophagitis: Secondary | ICD-10-CM | POA: Diagnosis present

## 2024-01-08 DIAGNOSIS — C7802 Secondary malignant neoplasm of left lung: Secondary | ICD-10-CM | POA: Diagnosis present

## 2024-01-08 DIAGNOSIS — Z96652 Presence of left artificial knee joint: Secondary | ICD-10-CM | POA: Diagnosis present

## 2024-01-08 DIAGNOSIS — Z79811 Long term (current) use of aromatase inhibitors: Secondary | ICD-10-CM

## 2024-01-08 DIAGNOSIS — E7800 Pure hypercholesterolemia, unspecified: Secondary | ICD-10-CM | POA: Diagnosis present

## 2024-01-08 DIAGNOSIS — Z6841 Body Mass Index (BMI) 40.0 and over, adult: Secondary | ICD-10-CM

## 2024-01-08 DIAGNOSIS — D84821 Immunodeficiency due to drugs: Secondary | ICD-10-CM | POA: Diagnosis present

## 2024-01-08 LAB — CBC WITH DIFFERENTIAL/PLATELET
Abs Immature Granulocytes: 0.02 10*3/uL (ref 0.00–0.07)
Basophils Absolute: 0 10*3/uL (ref 0.0–0.1)
Basophils Relative: 0 %
Eosinophils Absolute: 0.1 10*3/uL (ref 0.0–0.5)
Eosinophils Relative: 1 %
HCT: 37.1 % (ref 36.0–46.0)
Hemoglobin: 11.4 g/dL — ABNORMAL LOW (ref 12.0–15.0)
Immature Granulocytes: 0 %
Lymphocytes Relative: 20 %
Lymphs Abs: 1.5 10*3/uL (ref 0.7–4.0)
MCH: 27.8 pg (ref 26.0–34.0)
MCHC: 30.7 g/dL (ref 30.0–36.0)
MCV: 90.5 fL (ref 80.0–100.0)
Monocytes Absolute: 0.4 10*3/uL (ref 0.1–1.0)
Monocytes Relative: 6 %
Neutro Abs: 5.3 10*3/uL (ref 1.7–7.7)
Neutrophils Relative %: 73 %
Platelets: 185 10*3/uL (ref 150–400)
RBC: 4.1 MIL/uL (ref 3.87–5.11)
RDW: 14.7 % (ref 11.5–15.5)
WBC: 7.3 10*3/uL (ref 4.0–10.5)
nRBC: 0 % (ref 0.0–0.2)

## 2024-01-08 LAB — URINALYSIS, ROUTINE W REFLEX MICROSCOPIC
Bacteria, UA: NONE SEEN
Bilirubin Urine: NEGATIVE
Glucose, UA: NEGATIVE mg/dL
Ketones, ur: NEGATIVE mg/dL
Leukocytes,Ua: NEGATIVE
Nitrite: NEGATIVE
Protein, ur: NEGATIVE mg/dL
Specific Gravity, Urine: 1.015 (ref 1.005–1.030)
pH: 6 (ref 5.0–8.0)

## 2024-01-08 LAB — RESP PANEL BY RT-PCR (RSV, FLU A&B, COVID)  RVPGX2
Influenza A by PCR: NEGATIVE
Influenza B by PCR: NEGATIVE
Resp Syncytial Virus by PCR: NEGATIVE
SARS Coronavirus 2 by RT PCR: NEGATIVE

## 2024-01-08 LAB — COMPREHENSIVE METABOLIC PANEL WITH GFR
ALT: 14 U/L (ref 0–44)
AST: 15 U/L (ref 15–41)
Albumin: 3.2 g/dL — ABNORMAL LOW (ref 3.5–5.0)
Alkaline Phosphatase: 72 U/L (ref 38–126)
Anion gap: 9 (ref 5–15)
BUN: 16 mg/dL (ref 8–23)
CO2: 27 mmol/L (ref 22–32)
Calcium: 8.8 mg/dL — ABNORMAL LOW (ref 8.9–10.3)
Chloride: 107 mmol/L (ref 98–111)
Creatinine, Ser: 0.67 mg/dL (ref 0.44–1.00)
GFR, Estimated: >60 mL/min (ref >60)
Glucose, Bld: 110 mg/dL — ABNORMAL HIGH (ref 70–99)
Potassium: 3.8 mmol/L (ref 3.5–5.1)
Sodium: 143 mmol/L (ref 135–145)
Total Bilirubin: 0.5 mg/dL (ref 0.0–1.2)
Total Protein: 6.9 g/dL (ref 6.5–8.1)

## 2024-01-08 LAB — D-DIMER, QUANTITATIVE: D-Dimer, Quant: 1.76 ug{FEU}/mL — ABNORMAL HIGH (ref 0.00–0.50)

## 2024-01-08 LAB — TROPONIN I (HIGH SENSITIVITY)
Troponin I (High Sensitivity): 5 ng/L (ref <18)
Troponin I (High Sensitivity): 5 ng/L (ref <18)

## 2024-01-08 LAB — BRAIN NATRIURETIC PEPTIDE: B Natriuretic Peptide: 82 pg/mL (ref 0.0–100.0)

## 2024-01-08 MED ORDER — SODIUM CHLORIDE 0.9% FLUSH
3.0000 mL | Freq: Two times a day (BID) | INTRAVENOUS | Status: DC
  Administered 2024-01-09 – 2024-01-10 (×3): 3 mL via INTRAVENOUS

## 2024-01-08 MED ORDER — ACETAMINOPHEN 650 MG RE SUPP: 650.0000 mg | Freq: Four times a day (QID) | RECTAL | Status: DC | PRN

## 2024-01-08 MED ORDER — PRAVASTATIN SODIUM 40 MG PO TABS
40.0000 mg | ORAL_TABLET | Freq: Every day | ORAL | Status: DC
  Administered 2024-01-09: 40 mg via ORAL
  Filled 2024-01-08 (×2): qty 1

## 2024-01-08 MED ORDER — GABAPENTIN 400 MG PO CAPS
400.0000 mg | ORAL_CAPSULE | Freq: Three times a day (TID) | ORAL | Status: DC
  Filled 2024-01-08 (×5): qty 1

## 2024-01-08 MED ORDER — SODIUM CHLORIDE 0.9% FLUSH: 3.0000 mL | INTRAVENOUS | Status: DC | PRN

## 2024-01-08 MED ORDER — ALBUTEROL SULFATE (2.5 MG/3ML) 0.083% IN NEBU
2.5000 mg | INHALATION_SOLUTION | RESPIRATORY_TRACT | Status: DC | PRN
  Administered 2024-01-10: 2.5 mg via RESPIRATORY_TRACT
  Filled 2024-01-08: qty 3

## 2024-01-08 MED ORDER — DM-GUAIFENESIN ER 30-600 MG PO TB12
1.0000 | ORAL_TABLET | Freq: Two times a day (BID) | ORAL | Status: DC
  Administered 2024-01-08 – 2024-01-09 (×2): 1 via ORAL
  Filled 2024-01-08 (×4): qty 1

## 2024-01-08 MED ORDER — ACETAMINOPHEN 325 MG PO TABS
650.0000 mg | ORAL_TABLET | Freq: Four times a day (QID) | ORAL | Status: DC | PRN
  Administered 2024-01-09 – 2024-01-10 (×3): 650 mg via ORAL
  Filled 2024-01-08 (×3): qty 2

## 2024-01-08 MED ORDER — HEPARIN SODIUM (PORCINE) 5000 UNIT/ML IJ SOLN
5000.0000 [IU] | Freq: Three times a day (TID) | INTRAMUSCULAR | Status: DC
  Administered 2024-01-09 – 2024-01-10 (×5): 5000 [IU] via SUBCUTANEOUS
  Filled 2024-01-08 (×6): qty 1

## 2024-01-08 MED ORDER — NYSTATIN 100000 UNIT/GM EX POWD
Freq: Two times a day (BID) | CUTANEOUS | Status: DC
  Filled 2024-01-08: qty 15

## 2024-01-08 MED ORDER — ANASTROZOLE 1 MG PO TABS
1.0000 mg | ORAL_TABLET | Freq: Every day | ORAL | Status: DC
  Filled 2024-01-08 (×2): qty 1

## 2024-01-08 MED ORDER — ONDANSETRON HCL 4 MG/2ML IJ SOLN
4.0000 mg | Freq: Four times a day (QID) | INTRAMUSCULAR | Status: AC | PRN
Start: 2024-01-08 — End: ?

## 2024-01-08 MED ORDER — ANASTROZOLE 1 MG PO TABS
1.0000 mg | ORAL_TABLET | Freq: Every day | ORAL | Status: DC
  Administered 2024-01-09 – 2024-01-10 (×2): 1 mg via ORAL
  Filled 2024-01-08 (×3): qty 1

## 2024-01-08 MED ORDER — SERTRALINE HCL 50 MG PO TABS
250.0000 mg | ORAL_TABLET | Freq: Every day | ORAL | Status: DC
  Administered 2024-01-09 – 2024-01-10 (×2): 250 mg via ORAL
  Filled 2024-01-08: qty 5
  Filled 2024-01-08: qty 10

## 2024-01-08 MED ORDER — IPRATROPIUM-ALBUTEROL 0.5-2.5 (3) MG/3ML IN SOLN
3.0000 mL | Freq: Three times a day (TID) | RESPIRATORY_TRACT | Status: DC
  Administered 2024-01-09 – 2024-01-10 (×5): 3 mL via RESPIRATORY_TRACT
  Filled 2024-01-08 (×7): qty 3

## 2024-01-08 MED ORDER — POLYETHYLENE GLYCOL 3350 17 G PO PACK: 17.0000 g | PACK | Freq: Every day | ORAL | Status: DC | PRN

## 2024-01-08 MED ORDER — ALPRAZOLAM 1 MG PO TABS
2.0000 mg | ORAL_TABLET | Freq: Three times a day (TID) | ORAL | Status: DC | PRN
  Administered 2024-01-09 – 2024-01-10 (×4): 2 mg via ORAL
  Filled 2024-01-08 (×6): qty 2

## 2024-01-08 MED ORDER — AMLODIPINE BESYLATE 5 MG PO TABS
10.0000 mg | ORAL_TABLET | Freq: Every day | ORAL | Status: DC
  Administered 2024-01-09 – 2024-01-10 (×2): 10 mg via ORAL
  Filled 2024-01-08 (×3): qty 2

## 2024-01-08 MED ORDER — SODIUM CHLORIDE 0.9 % IV SOLN: INTRAVENOUS | Status: AC | PRN

## 2024-01-08 MED ORDER — IOHEXOL 350 MG/ML SOLN
75.0000 mL | Freq: Once | INTRAVENOUS | Status: AC | PRN
Start: 2024-01-08 — End: 2024-01-08

## 2024-01-08 MED ORDER — ONDANSETRON HCL 4 MG PO TABS: 4.0000 mg | ORAL_TABLET | Freq: Four times a day (QID) | ORAL | Status: DC | PRN

## 2024-01-08 MED ORDER — BISACODYL 10 MG RE SUPP: 10.0000 mg | Freq: Every day | RECTAL | Status: DC | PRN

## 2024-01-08 MED ORDER — PANTOPRAZOLE SODIUM 40 MG PO TBEC
40.0000 mg | DELAYED_RELEASE_TABLET | Freq: Every day | ORAL | Status: DC
  Administered 2024-01-09 – 2024-01-10 (×2): 40 mg via ORAL
  Filled 2024-01-08 (×2): qty 1

## 2024-01-08 MED ORDER — CARVEDILOL 12.5 MG PO TABS
12.5000 mg | ORAL_TABLET | Freq: Two times a day (BID) | ORAL | Status: DC
  Administered 2024-01-09 – 2024-01-10 (×3): 12.5 mg via ORAL
  Filled 2024-01-08 (×4): qty 1

## 2024-01-08 MED ORDER — TRAZODONE HCL 50 MG PO TABS
50.0000 mg | ORAL_TABLET | Freq: Every evening | ORAL | Status: DC | PRN
  Filled 2024-01-08: qty 1

## 2024-01-08 NOTE — ED Provider Notes (Signed)
 [place] EMERGENCY DEPARTMENT AT [facility] Provider Note   CSN: [id number] Arrival date & time: 01/08/24  7829     Patient presents with: Shortness of Breath   Claudia [name] is a 68 y.o. female.    Shortness of Breath  This patient is a 68 year old female, she is currently living at home with her son, she does not require oxygen, she has known metastatic breast cancer with metastatic disease to the lungs on immunotherapy and followed at [facility].  She has no prior history of cardiac or pulmonary disease that she is aware of and does not require oxygen at home.  She states that she is overweight and cannot have the knee replacement that she needs on the right because of her weight.  Because of this she chronically has dyspnea on exertion.  She regularly takes her oxygen because of the known metastatic disease and today she found that it was around 86%.  She felt no different than usual and was not tachypneic or short of breath whatsoever as she was taking this at rest.  She does endorse some chronic swelling of her left lower extremity and has undergone recent DVT and PE testing with ultrasound of her leg which she was told was negative, she also had an echocardiogram which she told was unremarkable.  She was found by paramedics to have a slightly low oxygen around 90%, they placed her on 2 L.  The patient does have a chronic mild cough which has not changed and denies any fevers or chills.  Additionally she has no nausea vomiting or diarrhea today, she does have IBS and usually has some amount of diarrhea    Prior to Admission medications   Medication Sig Start Date End Date Taking? Authorizing Provider  albuterol  (VENTOLIN  HFA) 108 (90 Base) MCG/ACT inhaler Inhale 2 puffs into the lungs 3 (three) times daily. 09/03/23   Colin Dawley, MD  alprazolam  (XANAX ) 2 MG tablet Take 2 mg by mouth 3 (three) times daily as needed for anxiety.    [provider]   amLODipine -benazepril  (LOTREL) 10-20 MG capsule Take 1 capsule by mouth daily. 12/28/23   [provider]  anastrozole  (ARIMIDEX ) 1 MG tablet Take 1 tablet by mouth daily. 07/17/20   [provider]  carvedilol  (COREG ) 12.5 MG tablet Take 12.5 mg by mouth 2 (two) times daily.    [provider]  esomeprazole (NEXIUM) 40 MG capsule Take 40 mg by mouth daily. 12/23/23   [provider]  ezetimibe  (ZETIA ) 10 MG tablet Take 1 tablet (10 mg total) by mouth daily. 09/02/12   Lissa Riding, NP  gabapentin  (NEURONTIN ) 400 MG capsule Take 400 mg by mouth 3 (three) times daily.    [provider]  guaiFENesin  (MUCINEX ) 600 MG 12 hr tablet Take 1 tablet (600 mg total) by mouth 2 (two) times daily. 09/03/23 09/02/24  Colin Dawley, MD  guaiFENesin -dextromethorphan  (ROBITUSSIN DM) 100-10 MG/5ML syrup Take 10 mLs by mouth every 4 (four) hours as needed for cough. 08/01/20   Gwendalyn Lemma, MD  hydrocortisone  2.5 % cream Apply 1 Application topically 2 (two) times daily. 11/29/23   [provider]  naloxone  (NARCAN ) nasal spray 4 mg/0.1 mL Place into the nose. 12/02/22   [provider]  nystatin  cream (MYCOSTATIN ) Apply topically 2 (two) times daily. 09/03/23   Colin Dawley, MD  ondansetron  (ZOFRAN -ODT) 4 MG disintegrating tablet Take by mouth. 08/11/23   [provider]  pantoprazole  (PROTONIX ) 40  MG tablet Take 1 tablet (40 mg total) by mouth daily. 09/03/23 09/02/24  Colin Dawley, MD  pravastatin  (PRAVACHOL ) 40 MG tablet Take 1 tablet by mouth at bedtime. 05/25/23 05/24/24  [provider]  sertraline  (ZOLOFT ) 100 MG tablet Take 250 mg by mouth daily.  09/02/12   Lissa Riding, NP    Allergies: Statins    Review of Systems  Respiratory:  Positive for shortness of breath.   All other systems reviewed and are negative.   Updated Vital Signs BP (!) 107/52   Pulse 68   Temp 98.3 F (36.8 C) (Oral)   Resp (!) 31   Ht 1.626 m (5'  4)   SpO2 93%   BMI 44.27 kg/m   Physical Exam Vitals and nursing note reviewed.  Constitutional:      General: She is not in acute distress.    Appearance: She is well-developed.  HENT:     Head: Normocephalic and atraumatic.     Mouth/Throat:     Pharynx: No oropharyngeal exudate.   Eyes:     General: No scleral icterus.       Right eye: No discharge.        Left eye: No discharge.     Conjunctiva/sclera: Conjunctivae normal.     Pupils: Pupils are equal, round, and reactive to light.   Neck:     Thyroid : No thyromegaly.     Vascular: No JVD.   Cardiovascular:     Rate and Rhythm: Normal rate and regular rhythm.     Heart sounds: Normal heart sounds. No murmur heard.    No friction rub. No gallop.  Pulmonary:     Effort: Pulmonary effort is normal. No respiratory distress.     Breath sounds: Rales present. No wheezing.     Comments: Subtle rales at the left base which clears with coughing Abdominal:     General: Bowel sounds are normal. There is no distension.     Palpations: Abdomen is soft. There is no mass.     Tenderness: There is no abdominal tenderness.   Musculoskeletal:        General: No tenderness. Normal range of motion.     Cervical back: Normal range of motion and neck supple.     Right lower leg: No edema.     Left lower leg: Edema present.     Comments: Prior knee replacement on the left with a well-healed anterior scar.  There is increasing edema on the left compared to the right  Lymphadenopathy:     Cervical: No cervical adenopathy.   Skin:    General: Skin is warm and dry.     Findings: No erythema or rash.   Neurological:     Mental Status: She is alert.     Coordination: Coordination normal.   Psychiatric:        Behavior: Behavior normal.     (all labs ordered are listed, but only abnormal results are displayed) Labs Reviewed  CBC WITH DIFFERENTIAL/PLATELET - Abnormal; Notable for the following components:      Result Value    Hemoglobin 11.4 (*)    All other components within normal limits  COMPREHENSIVE METABOLIC PANEL WITH GFR - Abnormal; Notable for the following components:   Glucose, Bld 110 (*)    Calcium 8.8 (*)    Albumin 3.2 (*)    All other components within normal limits  D-DIMER, QUANTITATIVE - Abnormal; Notable for the following components:   D-Dimer,  Quant 1.76 (*)    All other components within normal limits  URINALYSIS, ROUTINE W REFLEX MICROSCOPIC - Abnormal; Notable for the following components:   Hgb urine dipstick SMALL (*)    All other components within normal limits  RESP PANEL BY RT-PCR (RSV, FLU A&B, COVID)  RVPGX2  BRAIN NATRIURETIC PEPTIDE  TROPONIN I (HIGH SENSITIVITY)  TROPONIN I (HIGH SENSITIVITY)    EKG: EKG Interpretation Date/Time:  Saturday January 08 2024 09:12:38 EDT Ventricular Rate:  71 PR Interval:  178 QRS Duration:  100 QT Interval:  401 QTC Calculation: 436 R Axis:   85  Text Interpretation: Sinus rhythm Borderline right axis deviation Borderline T abnormalities, anterior leads Confirmed by Early Glisson ([id number]) on 01/08/2024 9:29:46 AM  Radiology: CT Angio Chest PE W and/or Wo Contrast Result Date: 01/08/2024 CLINICAL DATA:  Shortness of breath 2 days with mild hypoxia at rest. Metastatic breast cancer to lungs. Suspect pulmonary embolism. EXAM: CT ANGIOGRAPHY CHEST WITH CONTRAST TECHNIQUE: Multidetector CT imaging of the chest was performed using the standard protocol during bolus administration of intravenous contrast. Multiplanar CT image reconstructions and MIPs were obtained to evaluate the vascular anatomy. RADIATION DOSE REDUCTION: This exam was performed according to the departmental dose-optimization program which includes automated exposure control, adjustment of the mA and/or kV according to patient size and/or use of iterative reconstruction technique. CONTRAST:  75mL OMNIPAQUE  IOHEXOL  350 MG/ML SOLN COMPARISON:  09/02/2023 FINDINGS: Cardiovascular:  Left-sided Port-A-Cath present with tip over the region of the cavoatrial junction. Mild stable cardiomegaly. Calcified plaque over the left main and 3 vessel coronary arteries. Thoracic aorta is normal caliber. No aneurysm or dissection. Mild calcified plaque over the descending thoracic aorta. Pulmonary arterial system is well opacified without evidence of emboli. Remaining vascular structures are unchanged. Mediastinum/Nodes: No significant mediastinal or hilar adenopathy. Remaining mediastinal structures are unchanged. Lungs/Pleura: Lungs are adequately inflated without acute airspace consolidation or effusion. Minimal focal atelectasis over the lingula. Subtle stable subpleural reticulation over the right middle and upper lobe. Airways are unremarkable. Upper Abdomen: No acute findings. Calcified plaque over the abdominal aorta which is normal caliber. Prior cholecystectomy. Musculoskeletal: Evidence of previous right mastectomy with implant reconstruction. 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 Surgical clips over the right axilla. Degenerative change of the spine. Review of the MIP images confirms the above findings. IMPRESSION: 1. No evidence of pulmonary emboli or acute cardiopulmonary disease. 2. Stable cardiomegaly with left main and 3 vessel coronary artery disease. 3. Aortic atherosclerosis. 4. Evidence of previous right mastectomy with implant reconstruction. 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 Aortic Atherosclerosis (ICD10-I70.0). Electronically Signed   By: Roda Cirri M.D.   On: 01/08/2024 12:26   DG Chest Port 1 View Result Date: 01/08/2024 CLINICAL DATA:  Shortness of breath for 2 days. Hypoxia. Metastatic breast carcinoma. EXAM: PORTABLE CHEST 1 VIEW COMPARISON:  09/01/2023 FINDINGS: Normal heart size. Left-sided Port-A-Cath remains in place. Mild increase in interstitial infiltrates is seen, suspicious for  interstitial edema or pneumonitis. No focal consolidation or definite pleural effusion. IMPRESSION: Mild increase in interstitial infiltrates, suspicious for interstitial edema or pneumonitis. Electronically Signed   By: Marlyce Sine M.D.   On: 01/08/2024 11:00     Procedures   Medications Ordered in the ED  iohexol  (OMNIPAQUE ) 350 MG/ML injection 75 mL (75 mLs Intravenous Contrast Given 01/08/24 1142)  Medical Decision Making Amount and/or Complexity of Data Reviewed Labs: ordered. Radiology: ordered.  Risk Prescription drug management. Decision regarding hospitalization.    This patient presents to the ED for concern of shortness of breath and hypoxia, more hypoxia, this involves an extensive number of treatment options, and is a complaint that carries with it a high risk of complications and morbidity.  The differential diagnosis includes pneumonia, CHF, cardiac disease, pulmonary embolism though that seems less likely given that she is not tachycardic but does have risk given active cancer, she is on immune suppression and could have pneumonia   Co morbidities / Chronic conditions that complicate the patient evaluation  Obesity, metastatic cancer   Additional history obtained:  Additional history obtained from EMR External records from outside source obtained and reviewed including medical record and records from [facility]   Lab Tests:  I Ordered, and personally interpreted labs.  The pertinent results include: Leukocytosis, metabolic panel unchanged, D-dimer is high   Imaging Studies ordered:  I ordered imaging studies including CT scan of the chest angiogram I independently visualized and interpreted imaging which showed no pulmonary embolism or other pulmonary pathology I agree with the radiologist interpretation   Cardiac Monitoring: / EKG:  The patient was maintained on a cardiac monitor.  I personally viewed and interpreted the  cardiac monitored which showed an underlying rhythm of: Sinus rhythm   Problem List / ED Course / Critical interventions / Medication management  Unknown cause of hypoxia but when the patient is put on room air she drops to 85% supportive oxygen I ordered medication including supportive oxygen by nasal cannula Reevaluation of the patient after these medicines showed that the patient oxygen improved I have reviewed the patients home medicines and have made adjustments as needed   Consultations Obtained:  I requested consultation with the hospitalist Dr. Quintella Buck,  and discussed lab and imaging findings as well as pertinent plan - they recommend: Admission and viral testing   Social Determinants of Health:  Metastatic cancer   Test / Admission - Considered:  Admit for hypoxic respiratory failure, no distress      Final diagnoses:  Acute respiratory failure with hypoxia [facility])    ED Discharge Orders     None          Early Glisson, MD 01/08/24 1415

## 2024-01-08 NOTE — ED Triage Notes (Signed)
 Pt arrived from home via [facility] with complaints of SOB x2 days. Pt states O2 sats drop to 85% while at rest. Pt states she has breast cancer with mets to lungs and gets treatments at [facility].

## 2024-01-08 NOTE — Plan of Care (Signed)

## 2024-01-08 NOTE — ED Notes (Signed)
 Patient transported to CT

## 2024-01-08 NOTE — H&P (Addendum)
 History and Physical    Patient: Claudia [name]:[id number] DOB: July 13, 1956 DOA: 01/08/2024 DOS: the patient was seen and examined on 01/08/2024 PCP: Florinda Hush, FNP  Patient coming from: Home  Chief Complaint:  Chief Complaint  Patient presents with   Shortness of Breath   HPI: Claudia [name] is a 68 y.o. female with medical history significant for GERD, HTN, Anxiety Disorder, HLD, IBS and history significant of metastatic breast cancer with mets to the lungs and on immunosuppressive therapy followed closely by [facility] oncology who presents to the emergency department due to low level of oxygen at home.  -- Patient checked her O2 sats at home and she was down to 85% on room air. - Patient does Not have home O2 - Patient has history of recent COVID-19 infection and history of recent influenza infection. No fever  Or chills , no chest pains, no palpitations, no dizziness,   -- she has chronic leg swelling left more than right but no leg pains or pleuritic symptoms  No Nausea, Vomiting or Diarrhea -- In the ED transient hypoxia noted with O2 sats in the mid to high 80s on room air at rest, I had patient walk in place for just a couple minutes and O2 sats dropped down to 83%, patient was dyspneic -- COVID, flu and influenza is negative this time around - EKG and troponin are nonacute - WBC 7.3 hemoglobin 11.4 which is baseline, platelets 185 - UA unremarkable - BNP 82 - D-dimer elevated at 1.76 however patient has underlying malignancy - CTA chest without acute PE or pneumonia or CHF type findings - Creatinine 0.67, potassium 3.8 sodium 143, LFTs WNL  Review of Systems: As mentioned in the history of present illness. All other systems reviewed and are negative. Past Medical History:  Diagnosis Date   Anxiety    Breast cancer (HCC)    Cancer of right breast (HCC)    mastectomy   Depression    GERD (gastroesophageal reflux disease)    Hiatal hernia     Hypercholesterolemia    Hypertension    Irritable bowel    PONV (postoperative nausea and vomiting)    Past Surgical History:  Procedure Laterality Date   ABDOMINAL HYSTERECTOMY     BREAST SURGERY     lumpectomy   CHOLECYSTECTOMY     FOOT SURGERY     bilateral spur removal   HARDWARE REMOVAL Left 05/29/2014   Procedure: REMOVAL OF SCREW AND K-WIRE;  Surgeon: Pleasant Brilliant, MD;  Location: [facility];  Service: Orthopedics;  Laterality: Left;   JOINT REPLACEMENT Left 2017   Ankle   MASTECTOMY     ORIF ANKLE FRACTURE Left 10/10/2013   Procedure: OPEN REDUCTION INTERNAL FIXATION (ORIF) ANKLE FRACTURE;  Surgeon: Pleasant Brilliant, MD;  Location: [facility];  Service: Orthopedics;  Laterality: Left;   ORIF ANKLE FRACTURE Left 05/29/2014   Procedure: OPEN REDUCTION INTERNAL FIXATION (ORIF) ANKLE FRACTURE AND PLACEMENT OF BONE GRAFT MATERIAL;  Surgeon: Pleasant Brilliant, MD;  Location: [facility];  Service: Orthopedics;  Laterality: Left;   TONSILLECTOMY     TOTAL ABDOMINAL HYSTERECTOMY W/ BILATERAL SALPINGOOPHORECTOMY     TOTAL KNEE ARTHROPLASTY Left 09/20/2017   Procedure: LEFT TOTAL KNEE ARTHROPLASTY;  Surgeon: Adah Acron, MD;  Location: [facility];  Service: Orthopedics;  Laterality: Left;   TUBAL LIGATION     Social History:  reports that she quit smoking about 5 years ago. Her smoking use included cigarettes. She started smoking about 45  years ago. She has a 10 pack-year smoking history. She has never used smokeless tobacco. She reports that she does not drink alcohol and does not use drugs.  Allergies  Allergen Reactions   Statins Other (See Comments)    Muscle pain    Family History  Problem Relation Age of Onset   Liver disease Mother    Hypertension Father    Heart attack Sister     Prior to Admission medications   Medication Sig Start Date End Date Taking? Authorizing Provider  acetaminophen  (TYLENOL ) 500 MG tablet Take 1,000 mg by mouth every 6 (six) hours as needed for headache.   Yes  [provider]  alprazolam  (XANAX ) 2 MG tablet Take 2 mg by mouth in the morning, at noon, and at bedtime.   Yes [provider]  amLODipine -benazepril  (LOTREL) 10-20 MG capsule Take 1 capsule by mouth daily.   Yes [provider]  anastrozole  (ARIMIDEX ) 1 MG tablet Take 1 tablet by mouth daily. 07/17/20  Yes [provider]  carvedilol  (COREG ) 12.5 MG tablet Take 12.5 mg by mouth 2 (two) times daily.   Yes [provider]  esomeprazole (NEXIUM) 40 MG capsule Take 40 mg by mouth daily. 12/23/23  Yes [provider]  ezetimibe  (ZETIA ) 10 MG tablet Take 10 mg by mouth at bedtime.   Yes [provider]  gabapentin  (NEURONTIN ) 400 MG capsule Take 400 mg by mouth at bedtime as needed (nerve pain, neuropathy, sleep).   Yes [provider]  hydrocortisone  2.5 % cream Apply 1 Application topically 2 (two) times daily.   Yes [provider]  nystatin  (MYCOSTATIN /NYSTOP ) powder Apply 1 Application topically daily as needed (rash, infection).   Yes [provider]  nystatin  cream (MYCOSTATIN ) Apply 1 Application topically 2 (two) times daily.   Yes [provider]  ondansetron  (ZOFRAN -ODT) 4 MG disintegrating tablet Take 4 mg by mouth every 8 (eight) hours as needed for nausea or vomiting.   Yes [provider]  pravastatin  (PRAVACHOL ) 40 MG tablet Take 1 tablet by mouth at bedtime. 05/25/23 05/24/24 Yes [provider]  sertraline  (ZOLOFT ) 100 MG tablet Take 200 mg by mouth daily. 09/02/12  Yes Lissa Riding, NP    Physical Exam: Vitals:   01/08/24 1440 01/08/24 1443 01/08/24 1445 01/08/24 1602  BP: 137/75  137/73 135/68  Pulse:  70 68 75  Resp:  (!) 25 (!) 25 20  Temp:  97.7 F (36.5 C)  98 F (36.7 C)  TempSrc:  Oral  Oral  SpO2:  92% 92% 94%  Weight:    121.9 kg  Height:    5' 4 (1.626 m)    Physical Exam  Gen:- Awake Alert, in no acute distress, morbidly obese HEENT:-  Media.AT, No sclera icterus Nose- [place] 2L/min Neck-Supple Neck,No JVD,.  Lungs-no wheezing, fair air movement bilaterally  CV- S1, S2 normal, RRR, prior right-sided mastectomy, left-sided Port-A-Cath in situ Abd-  +ve B.Sounds, Abd Soft, No tenderness, increased truncal adiposity Extremity/Skin:-Chronic leg swelling left more than right edema,   good pedal pulses , inframammary areas as well as skin folds with erythematous macular rash with satellite lesions consistent with candidal intertrigo Psych-affect is appropriate, oriented x3 Neuro-no new focal deficits, no tremors  Data Reviewed: COVID, flu and influenza is negative this time around - EKG and troponin are nonacute - WBC 7.3 hemoglobin 11.4 which is baseline, platelets 185 - UA unremarkable - BNP 82 - D-dimer elevated at 1.76 however patient  has underlying malignancy - CTA chest without acute PE or pneumonia or CHF type findings - Creatinine 0.67, potassium 3.8 sodium 143, LFTs WNL  Assessment and Plan: 1) acute hypoxic respiratory failure ---?? Etiology -Patient checked her O2 sats at home and she was down to 85% on room air. - Patient does Not have home O2 - Patient has history of recent COVID-19 infection and history of recent influenza infection. No fever  Or chills , no chest pains, no palpitations, no dizziness,   -- she has chronic leg swelling left more than right but no leg pains or pleuritic symptoms. -  In the ED hypoxia noted with O2 sats in the mid to high 80s on room air at rest, I had patient walk in place for just a couple minutes and O2 sats dropped down to 83%, patient was dyspneic --COVID, flu and influenza is negative this time around - EKG and troponin are nonacute - WBC 7.3 ,- BNP 82 - CTA chest without acute PE or pneumonia or CHF type findings -- Give bronchodilators, mucolytics - = Give supplemental oxygen -Check lower extremity venous Dopplers -- 2)HTN--- restart  PTA Amlodipine  and  Coreg   3)metastatic breast cancer with mets to the lungs and on immunosuppressive therapy followed closely by [facility] oncology -- --continue Arimidex   4)Anxiety Disorder--- stable, continue Zoloft , may use Xanax  as needed  5)HLD--stable, continue atorvastatin  6)GERD--stable, continue Protonix   7) candidal intertrigo ---topical nystatin  as ordered  8)Morbid Obesity- -Low calorie diet, portion control and increase physical activity discussed with patient -Body mass index is 46.13 kg/m.  --  Advance Care Planning:   Code Status: Full Code   Family Communication: none available  Severity of Illness: The appropriate patient status for this patient is OBSERVATION. Observation status is judged to be reasonable and necessary in order to provide the required intensity of service to ensure the patient's safety. The patient's presenting symptoms, physical exam findings, and initial radiographic and laboratory data in the context of their medical condition is felt to place them at decreased risk for further clinical deterioration. Furthermore, it is anticipated that the patient will be medically stable for discharge from the hospital within 2 midnights of admission.   Author: Colin Dawley, MD 01/08/2024 6:22 PM  For on call review [email or web address].

## 2024-01-09 ENCOUNTER — Observation Stay (HOSPITAL_COMMUNITY)

## 2024-01-09 DIAGNOSIS — B372 Candidiasis of skin and nail: Secondary | ICD-10-CM | POA: Diagnosis present

## 2024-01-09 DIAGNOSIS — C50911 Malignant neoplasm of unspecified site of right female breast: Secondary | ICD-10-CM | POA: Diagnosis present

## 2024-01-09 DIAGNOSIS — I10 Essential (primary) hypertension: Secondary | ICD-10-CM | POA: Diagnosis present

## 2024-01-09 DIAGNOSIS — Z6841 Body Mass Index (BMI) 40.0 and over, adult: Secondary | ICD-10-CM | POA: Diagnosis not present

## 2024-01-09 DIAGNOSIS — C7801 Secondary malignant neoplasm of right lung: Secondary | ICD-10-CM | POA: Diagnosis present

## 2024-01-09 DIAGNOSIS — Z79811 Long term (current) use of aromatase inhibitors: Secondary | ICD-10-CM | POA: Diagnosis not present

## 2024-01-09 DIAGNOSIS — D84821 Immunodeficiency due to drugs: Secondary | ICD-10-CM | POA: Diagnosis present

## 2024-01-09 DIAGNOSIS — E7800 Pure hypercholesterolemia, unspecified: Secondary | ICD-10-CM | POA: Diagnosis present

## 2024-01-09 DIAGNOSIS — Z7960 Long term (current) use of unspecified immunomodulators and immunosuppressants: Secondary | ICD-10-CM | POA: Diagnosis not present

## 2024-01-09 DIAGNOSIS — Z90710 Acquired absence of both cervix and uterus: Secondary | ICD-10-CM | POA: Diagnosis not present

## 2024-01-09 DIAGNOSIS — J9601 Acute respiratory failure with hypoxia: Secondary | ICD-10-CM | POA: Diagnosis present

## 2024-01-09 DIAGNOSIS — Z9011 Acquired absence of right breast and nipple: Secondary | ICD-10-CM | POA: Diagnosis not present

## 2024-01-09 DIAGNOSIS — Z8616 Personal history of COVID-19: Secondary | ICD-10-CM | POA: Diagnosis not present

## 2024-01-09 DIAGNOSIS — Z96652 Presence of left artificial knee joint: Secondary | ICD-10-CM | POA: Diagnosis present

## 2024-01-09 DIAGNOSIS — Z79899 Other long term (current) drug therapy: Secondary | ICD-10-CM | POA: Diagnosis not present

## 2024-01-09 DIAGNOSIS — K219 Gastro-esophageal reflux disease without esophagitis: Secondary | ICD-10-CM | POA: Diagnosis present

## 2024-01-09 DIAGNOSIS — E66813 Obesity, class 3: Secondary | ICD-10-CM | POA: Diagnosis present

## 2024-01-09 DIAGNOSIS — F419 Anxiety disorder, unspecified: Secondary | ICD-10-CM | POA: Diagnosis present

## 2024-01-09 DIAGNOSIS — Z87891 Personal history of nicotine dependence: Secondary | ICD-10-CM | POA: Diagnosis not present

## 2024-01-09 DIAGNOSIS — Z8249 Family history of ischemic heart disease and other diseases of the circulatory system: Secondary | ICD-10-CM | POA: Diagnosis not present

## 2024-01-09 DIAGNOSIS — C7802 Secondary malignant neoplasm of left lung: Secondary | ICD-10-CM | POA: Diagnosis present

## 2024-01-09 DIAGNOSIS — Z1152 Encounter for screening for COVID-19: Secondary | ICD-10-CM | POA: Diagnosis not present

## 2024-01-09 MED ORDER — METHYLPREDNISOLONE SODIUM SUCC 40 MG IJ SOLR
40.0000 mg | Freq: Two times a day (BID) | INTRAMUSCULAR | Status: DC
  Administered 2024-01-09 – 2024-01-10 (×3): 40 mg via INTRAVENOUS
  Filled 2024-01-09 (×3): qty 1

## 2024-01-09 NOTE — Progress Notes (Signed)
 PROGRESS NOTE  Claudia [name], is a 68 y.o. female, DOB - 01/06/1956, ZOX:[id number]  Admit date - 01/08/2024   Admitting Physician Velita Quirk Quintella Buck, MD  Outpatient Primary MD for the patient is Romer, Oneda Big, FNP  LOS - 0  Chief Complaint  Patient presents with   Shortness of Breath      Brief Narrative:  68 y.o. female with medical history significant for GERD, HTN, Anxiety Disorder, HLD, IBS and history significant of metastatic breast cancer with mets to the lungs and on immunosuppressive therapy followed closely by [facility] oncology who presents to the emergency department due to low level of oxygen at home.  -- Patient checked her O2 sats at home and she was down to 85% on room air. - Patient does Not have home O2    -Assessment and Plan: 1) Acute hypoxic respiratory failure ---?? Etiology - Patient does Not have home O2 - Patient has history of recent COVID-19 infection and history of recent influenza infection. No fever  Or chills , no chest pains, no palpitations, no dizziness,   -- she has chronic leg swelling left more than right but no leg pains or pleuritic symptoms. ---COVID, flu and influenza is negative this time around - EKG and troponin are nonacute - CTA chest without acute PE or pneumonia or CHF type findings -- 01/09/24 -- - Ambulated patient on room air just around the room and O2 sats dropped down to 86% on room air , with some dyspnea on exertion  - No productive cough -Will add IV Solu-Medrol  C/n  bronchodilators, mucolytics - = Give supplemental oxygen at 2L/min -Lower extremity venous Dopplers without acute DVT -- 2)HTN--- c/n Amlodipine  and Coreg    3)metastatic breast cancer with mets to the lungs and on immunosuppressive therapy followed closely by [facility] oncology -- --continue Arimidex    4)Anxiety Disorder--- stable, continue Zoloft , may use Xanax  as needed   5)HLD--stable, continue atorvastatin   6)GERD--stable, continue Protonix    7)  candidal intertrigo ---topical nystatin  as ordered   8)Morbid Obesity- -Low calorie diet, portion control and increase physical activity discussed with patient -Body mass index is 46.13 kg/m.  Status is: Inpatient   Disposition: The patient is from: Home              Anticipated d/c is to: Home              Anticipated d/c date is: 1 day              Patient currently is not medically stable to d/c. Barriers: Not Clinically Stable-   Code Status :  -  Code Status: Full Code   Family Communication:    NA (patient is alert, awake and coherent)   DVT Prophylaxis  :   - SCDs   heparin  injection 5,000 Units Start: 01/08/24 2200 SCDs Start: 01/08/24 1420 Place TED hose Start: 01/08/24 1420   Lab Results  Component Value Date   PLT 185 01/08/2024    Inpatient Medications  Scheduled Meds:  amLODipine   10 mg Oral Daily   anastrozole   1 mg Oral Daily   carvedilol   12.5 mg Oral BID   dextromethorphan -guaiFENesin   1 tablet Oral BID   gabapentin   400 mg Oral TID   heparin   5,000 Units Subcutaneous Q8H   ipratropium-albuterol   3 mL Nebulization TID   methylPREDNISolone  (SOLU-MEDROL ) injection  40 mg Intravenous Q12H   nystatin    Topical BID   pantoprazole   40 mg Oral Daily   pravastatin   40 mg Oral QHS   sertraline   250 mg Oral Daily   sodium chloride  flush  3 mL Intravenous Q12H   sodium chloride  flush  3 mL Intravenous Q12H   Continuous Infusions: PRN Meds:.acetaminophen  **OR** acetaminophen , albuterol , alprazolam , bisacodyl, ondansetron  **OR** ondansetron  (ZOFRAN ) IV, polyethylene glycol, sodium chloride  flush, traZODone    Anti-infectives (From admission, onward)    None         Subjective: Claudia [name] today has no fevers, no emesis,  No chest pain,   - Ambulated patient on room air just around the room and O2 sats dropped down to 86% on room air , with some dyspnea on exertion  -- Patient placed back on 2 L of oxygen via nasal cannula   Objective: Vitals:    01/09/24 0418 01/09/24 0830 01/09/24 1326 01/09/24 1448  BP: (!) 143/81  108/63   Pulse: 73  66   Resp: 20     Temp: 97.7 F (36.5 C)  97.7 F (36.5 C)   TempSrc: Oral  Oral   SpO2: 95% 98% 93% 93%  Weight:      Height:        Intake/Output Summary (Last 24 hours) at 01/09/2024 1734 Last data filed at 01/09/2024 1325 Gross per 24 hour  Intake 360 ml  Output 300 ml  Net 60 ml   Filed Weights   01/08/24 1602  Weight: 121.9 kg    Physical Exam  Gen:- Awake Alert, in no acute distress, morbidly obese HEENT:- [place].AT, No sclera icterus Nose- [place] 2L/min Neck-Supple Neck,No JVD,.  Lungs-no wheezing, fair air movement bilaterally  CV- S1, S2 normal, RRR, prior right-sided mastectomy, left-sided Port-A-Cath in situ Abd-  +ve B.Sounds, Abd Soft, No tenderness, increased truncal adiposity Extremity/Skin:-Chronic leg swelling left more than right edema,   good pedal pulses , inframammary areas as well as skin folds with erythematous macular rash with satellite lesions consistent with candidal intertrigo Psych-affect is appropriate, oriented x3 Neuro-no new focal deficits, no tremors  Data Reviewed: I have personally reviewed following labs and imaging studies  CBC: Recent Labs  Lab 01/08/24 0929  WBC 7.3  NEUTROABS 5.3  HGB 11.4*  HCT 37.1  MCV 90.5  PLT 185   Basic Metabolic Panel: Recent Labs  Lab 01/08/24 0929  NA 143  K 3.8  CL 107  CO2 27  GLUCOSE 110*  BUN 16  CREATININE 0.67  CALCIUM 8.8*   GFR: Estimated Creatinine Clearance: 89.1 mL/min (by C-G formula based on SCr of 0.67 mg/dL). Liver Function Tests: Recent Labs  Lab 01/08/24 0929  AST 15  ALT 14  ALKPHOS 72  BILITOT 0.5  PROT 6.9  ALBUMIN 3.2*   Recent Results (from the past 240 hours)  Resp panel by RT-PCR (RSV, Flu A&B, Covid) Anterior Nasal Swab     Status: None   Collection Time: 01/08/24  2:27 PM   Specimen: Anterior Nasal Swab  Result Value Ref Range Status   SARS Coronavirus 2 by  RT PCR NEGATIVE NEGATIVE Final    Comment: (NOTE) SARS-CoV-2 target nucleic acids are NOT DETECTED.  The SARS-CoV-2 RNA is generally detectable in upper respiratory specimens during the acute phase of infection. The lowest concentration of SARS-CoV-2 viral copies this assay can detect is 138 copies/mL. A negative result does not preclude SARS-Cov-2 infection and should not be used as the sole basis for treatment or other patient management decisions. A negative result may occur with  improper specimen collection/handling, submission of specimen other than nasopharyngeal  swab, presence of viral mutation(s) within the areas targeted by this assay, and inadequate number of viral copies(<138 copies/mL). A negative result must be combined with clinical observations, patient history, and epidemiological information. The expected result is Negative.  Fact Sheet for Patients:  BloggerCourse.com  Fact Sheet for Healthcare Providers:  SeriousBroker.it  This test is no t yet approved or cleared by the [place]  FDA and  has been authorized for detection and/or diagnosis of SARS-CoV-2 by FDA under an Emergency Use Authorization (EUA). This EUA will remain  in effect (meaning this test can be used) for the duration of the COVID-19 declaration under Section 564(b)(1) of the Act, 21 U.S.C.section 360bbb-3(b)(1), unless the authorization is terminated  or revoked sooner.       Influenza A by PCR NEGATIVE NEGATIVE Final   Influenza B by PCR NEGATIVE NEGATIVE Final    Comment: (NOTE) The Xpert Xpress SARS-CoV-2/FLU/RSV plus assay is intended as an aid in the diagnosis of influenza from Nasopharyngeal swab specimens and should not be used as a sole basis for treatment. Nasal washings and aspirates are unacceptable for Xpert Xpress SARS-CoV-2/FLU/RSV testing.  Fact Sheet for Patients: BloggerCourse.com  Fact Sheet  for Healthcare Providers: SeriousBroker.it  This test is not yet approved or cleared by the [place]  FDA and has been authorized for detection and/or diagnosis of SARS-CoV-2 by FDA under an Emergency Use Authorization (EUA). This EUA will remain in effect (meaning this test can be used) for the duration of the COVID-19 declaration under Section 564(b)(1) of the Act, 21 U.S.C. section 360bbb-3(b)(1), unless the authorization is terminated or revoked.     Resp Syncytial Virus by PCR NEGATIVE NEGATIVE Final    Comment: (NOTE) Fact Sheet for Patients: BloggerCourse.com  Fact Sheet for Healthcare Providers: SeriousBroker.it  This test is not yet approved or cleared by the [place]  FDA and has been authorized for detection and/or diagnosis of SARS-CoV-2 by FDA under an Emergency Use Authorization (EUA). This EUA will remain in effect (meaning this test can be used) for the duration of the COVID-19 declaration under Section 564(b)(1) of the Act, 21 U.S.C. section 360bbb-3(b)(1), unless the authorization is terminated or revoked.  Performed at [facility], [address] [place], [place] [address]     Radiology Studies: US  Venous Img Lower Bilateral (DVT) Result Date: 01/09/2024 CLINICAL DATA:  Pulmonary emboli.  Assess for residual DVT. EXAM: BILATERAL LOWER EXTREMITY VENOUS DOPPLER ULTRASOUND TECHNIQUE: Gray-scale sonography with graded compression, as well as color Doppler and duplex ultrasound were performed to evaluate the lower extremity deep venous systems from the level of the common femoral vein and including the common femoral, femoral, profunda femoral, popliteal and calf veins including the posterior tibial, peroneal and gastrocnemius veins when visible. The superficial great saphenous vein was also interrogated. Spectral Doppler was utilized to evaluate flow at rest and with distal augmentation  maneuvers in the common femoral, femoral and popliteal veins. COMPARISON:  None Available. FINDINGS: RIGHT LOWER EXTREMITY Common Femoral Vein: No evidence of thrombus. Normal compressibility, respiratory phasicity and response to augmentation. Saphenofemoral Junction: No evidence of thrombus. Normal compressibility and flow on color Doppler imaging. Profunda Femoral Vein: No evidence of thrombus. Normal compressibility and flow on color Doppler imaging. Femoral Vein: No evidence of thrombus. Normal compressibility, respiratory phasicity and response to augmentation. Popliteal Vein: No evidence of thrombus. Normal compressibility, respiratory phasicity and response to augmentation. Calf Veins: No evidence of thrombus. Normal compressibility and flow on color Doppler imaging. Superficial Great Saphenous Vein:  No evidence of thrombus. Normal compressibility. Venous Reflux:  None. Other Findings:  None. LEFT LOWER EXTREMITY Common Femoral Vein: No evidence of thrombus. Normal compressibility, respiratory phasicity and response to augmentation. Saphenofemoral Junction: No evidence of thrombus. Normal compressibility and flow on color Doppler imaging. Profunda Femoral Vein: No evidence of thrombus. Normal compressibility and flow on color Doppler imaging. Femoral Vein: No evidence of thrombus. Normal compressibility, respiratory phasicity and response to augmentation. Popliteal Vein: No evidence of thrombus. Normal compressibility, respiratory phasicity and response to augmentation. Calf Veins: No evidence of thrombus. Normal compressibility and flow on color Doppler imaging. Superficial Great Saphenous Vein: No evidence of thrombus. Normal compressibility. Venous Reflux:  None. Other Findings:  None. IMPRESSION: No evidence of deep venous thrombosis in either lower extremity. Electronically Signed   By: Fernando Hoyer M.D.   On: 01/09/2024 11:41   CT Angio Chest PE W and/or Wo Contrast Result Date:  01/08/2024 CLINICAL DATA:  Shortness of breath 2 days with mild hypoxia at rest. Metastatic breast cancer to lungs. Suspect pulmonary embolism. EXAM: CT ANGIOGRAPHY CHEST WITH CONTRAST TECHNIQUE: Multidetector CT imaging of the chest was performed using the standard protocol during bolus administration of intravenous contrast. Multiplanar CT image reconstructions and MIPs were obtained to evaluate the vascular anatomy. RADIATION DOSE REDUCTION: This exam was performed according to the departmental dose-optimization program which includes automated exposure control, adjustment of the mA and/or kV according to patient size and/or use of iterative reconstruction technique. CONTRAST:  75mL OMNIPAQUE  IOHEXOL  350 MG/ML SOLN COMPARISON:  09/02/2023 FINDINGS: Cardiovascular: Left-sided Port-A-Cath present with tip over the region of the cavoatrial junction. Mild stable cardiomegaly. Calcified plaque over the left main and 3 vessel coronary arteries. Thoracic aorta is normal caliber. No aneurysm or dissection. Mild calcified plaque over the descending thoracic aorta. Pulmonary arterial system is well opacified without evidence of emboli. Remaining vascular structures are unchanged. Mediastinum/Nodes: No significant mediastinal or hilar adenopathy. Remaining mediastinal structures are unchanged. Lungs/Pleura: Lungs are adequately inflated without acute airspace consolidation or effusion. Minimal focal atelectasis over the lingula. Subtle stable subpleural reticulation over the right middle and upper lobe. Airways are unremarkable. Upper Abdomen: No acute findings. Calcified plaque over the abdominal aorta which is normal caliber. Prior cholecystectomy. Musculoskeletal: Evidence of previous right mastectomy with implant reconstruction. 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 Surgical clips over the right axilla. Degenerative change of the spine. Review of the MIP images confirms the above  findings. IMPRESSION: 1. No evidence of pulmonary emboli or acute cardiopulmonary disease. 2. Stable cardiomegaly with left main and 3 vessel coronary artery disease. 3. Aortic atherosclerosis. 4. Evidence of previous right mastectomy with implant reconstruction. 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 Aortic Atherosclerosis (ICD10-I70.0). Electronically Signed   By: Roda Cirri M.D.   On: 01/08/2024 12:26   DG Chest Port 1 View Result Date: 01/08/2024 CLINICAL DATA:  Shortness of breath for 2 days. Hypoxia. Metastatic breast carcinoma. EXAM: PORTABLE CHEST 1 VIEW COMPARISON:  09/01/2023 FINDINGS: Normal heart size. Left-sided Port-A-Cath remains in place. Mild increase in interstitial infiltrates is seen, suspicious for interstitial edema or pneumonitis. No focal consolidation or definite pleural effusion. IMPRESSION: Mild increase in interstitial infiltrates, suspicious for interstitial edema or pneumonitis. Electronically Signed   By: Marlyce Sine M.D.   On: 01/08/2024 11:00   Scheduled Meds:  amLODipine   10 mg Oral Daily   anastrozole   1 mg Oral Daily   carvedilol   12.5 mg Oral BID   dextromethorphan -guaiFENesin   1 tablet Oral BID   gabapentin   400 mg Oral TID   heparin   5,000 Units Subcutaneous Q8H   ipratropium-albuterol   3 mL Nebulization TID   methylPREDNISolone  (SOLU-MEDROL ) injection  40 mg Intravenous Q12H   nystatin    Topical BID   pantoprazole   40 mg Oral Daily   pravastatin   40 mg Oral QHS   sertraline   250 mg Oral Daily   sodium chloride  flush  3 mL Intravenous Q12H   sodium chloride  flush  3 mL Intravenous Q12H   Continuous Infusions:   LOS: 0 days   Colin Dawley M.D on 01/09/2024 at 5:34 PM  Go to [email or web address] - for contact info  [facility] - Office  [phone]  If 7PM-7AM, please contact night-coverage [email or web address] 01/09/2024, 5:34 PM

## 2024-01-09 NOTE — Plan of Care (Signed)

## 2024-01-09 NOTE — Care Management Obs Status (Signed)
 MEDICARE OBSERVATION STATUS NOTIFICATION   Patient Details  Name: Claudia [name] MRN: [id number] Date of Birth: 10-01-1955   Medicare Observation Status Notification Given:  Yes    Grandville Lax, Milinda Allen 01/09/2024, 12:32 PM

## 2024-01-09 NOTE — TOC CM/SW Note (Signed)
 Transition of Care [facility]) - Inpatient Brief Assessment   Patient Details  Name: Claudia [name] MRN: [id number] Date of Birth: [birth year]  Transition of [facility]) CM/SW Contact:    Grandville Lax, LCSWA Phone Number: 01/09/2024, 12:52 PM   Clinical Narrative: Transition of Care Department [facility]) has reviewed patient and no TOC needs have been identified at this time. We will continue to monitor patient advancement through interdiciplinary progression rounds. If new patient transition needs arise, please place a TOC consult.  Transition of Care Asessment: Insurance and Status: Insurance coverage has been reviewed Patient has primary care physician: Yes Home environment has been reviewed: From home Prior level of function:: Independent Prior/Current Home Services: No current home services Social Drivers of Health Review: SDOH reviewed no interventions necessary Readmission risk has been reviewed: Yes Transition of care needs: no transition of care needs at this time

## 2024-01-10 DIAGNOSIS — J9601 Acute respiratory failure with hypoxia: Secondary | ICD-10-CM | POA: Diagnosis not present

## 2024-01-10 MED ORDER — ALBUTEROL SULFATE HFA 108 (90 BASE) MCG/ACT IN AERS: 2.0000 | INHALATION_SPRAY | Freq: Four times a day (QID) | RESPIRATORY_TRACT | 2 refills | Status: AC | PRN

## 2024-01-10 MED ORDER — NYSTATIN 100000 UNIT/GM EX POWD: Freq: Two times a day (BID) | CUTANEOUS | Status: DC

## 2024-01-10 MED ORDER — PREDNISONE 20 MG PO TABS: 40.0000 mg | ORAL_TABLET | Freq: Every day | ORAL | 0 refills | Status: AC

## 2024-01-10 NOTE — Evaluation (Signed)
 Physical Therapy Evaluation Patient Details Name: Claudia [name] MRN: [id number] DOB: 1955-12-02 Today's Date: 01/10/2024  History of Present Illness  Claudia [name] is a 68 y.o. female with medical history significant for GERD, HTN, Anxiety Disorder, HLD, IBS and history significant of metastatic breast cancer with mets to the lungs and on immunosuppressive therapy followed closely by [facility] oncology who presents to the emergency department due to low level of oxygen at home.   -- Patient checked her O2 sats at home and she was down to 85% on room air.  - Patient does Not have home O2  - Patient has history of recent COVID-19 infection and history of recent influenza infection.  No fever  Or chills , no chest pains, no palpitations, no dizziness,    -- she has chronic leg swelling left more than right but no leg pains or pleuritic symptoms   Clinical Impression  Patient functioning near baseline for functional mobility and gait demonstrating good return for transferring to/from commode in bathroom.  Patient ambulates with slow slightly labored movement, can safelty walk without AD for short distances, safer using RW in hallway, no loss of balance, on room air with SpO2 at 94%, HR 101, limited mostly due to fatigue. Patient tolerated sitting up in chair after therapy. Patient will benefit from continued skilled physical therapy in hospital and recommended venue below to increase strength, balance, endurance for safe ADLs and gait.           If plan is discharge home, recommend the following: A little help with walking and/or transfers;A little help with bathing/dressing/bathroom;Help with stairs or ramp for entrance;Assistance with cooking/housework   Can travel by private vehicle        Equipment Recommendations None recommended by PT  Recommendations for Other Services       Functional Status Assessment Patient has had a recent decline in their functional status and demonstrates the  ability to make significant improvements in function in a reasonable and predictable amount of time.     Precautions / Restrictions Precautions Precautions: Fall Restrictions Weight Bearing Restrictions Per Provider Order: No      Mobility  Bed Mobility Overal bed mobility: Modified Independent             General bed mobility comments: Required HOB partially raised    Transfers Overall transfer level: Needs assistance Equipment used: None, Rolling walker (2 wheels) Transfers: Sit to/from Stand, Bed to chair/wheelchair/BSC Sit to Stand: Supervision   Step pivot transfers: Supervision       General transfer comment: good return for trasnferring to/from commode in bathroo without use of an AD    Ambulation/Gait Ambulation/Gait assistance: Supervision, Contact guard assist Gait Distance (Feet): 75 Feet Assistive device: Rolling walker (2 wheels) Gait Pattern/deviations: Decreased step length - right, Decreased step length - left, Decreased stride length Gait velocity: decreased     General Gait Details: slow slightly labored movement, can safelty walk without AD for short distances, safer using RW in hallway, no loss of balance, on room air with SpO2 at 94%, HR 101, limited mostly due to fatigue  Stairs            Wheelchair Mobility     Tilt Bed    Modified Rankin (Stroke Patients Only)       Balance Overall balance assessment: Needs assistance Sitting-balance support: Feet supported, No upper extremity supported Sitting balance-Leahy Scale: Good Sitting balance - Comments: seated at EOB   Standing balance support: During  functional activity, No upper extremity supported Standing balance-Leahy Scale: Fair Standing balance comment: fair/good using RW                             Pertinent Vitals/Pain Pain Assessment Pain Assessment: Faces Faces Pain Scale: Hurts a little bit Pain Location: right knee when walking Pain Descriptors /  Indicators: Discomfort, Sore Pain Intervention(s): Limited activity within patient's tolerance, Monitored during session, Repositioned    Home Living Family/patient expects to be discharged to:: Private residence Living Arrangements: Children;Other relatives Available Help at Discharge: Family;Available PRN/intermittently Type of Home: House Home Access: Stairs to enter Entrance Stairs-Rails: Lawyer of Steps: 4   Home Layout: One level Home Equipment: Agricultural consultant (2 wheels);Shower seat - built in;BSC/3in1      Prior Function Prior Level of Function : Independent/Modified Independent             Mobility Comments: household and short distanced community ambulation ADLs Comments: Independent     Extremity/Trunk Assessment   Upper Extremity Assessment Upper Extremity Assessment: Defer to OT evaluation    Lower Extremity Assessment Lower Extremity Assessment: Overall WFL for tasks assessed    Cervical / Trunk Assessment Cervical / Trunk Assessment: Normal  Communication   Communication Communication: No apparent difficulties    Cognition Arousal: Alert Behavior During Therapy: WFL for tasks assessed/performed   PT - Cognitive impairments: No apparent impairments                         Following commands: Intact       Cueing Cueing Techniques: Verbal cues     General Comments      Exercises     Assessment/Plan    PT Assessment Patient needs continued PT services  PT Problem List Decreased strength;Decreased activity tolerance;Decreased balance;Decreased mobility       PT Treatment Interventions DME instruction;Gait training;Stair training;Functional mobility training;Therapeutic activities;Therapeutic exercise;Balance training;Patient/family education    PT Goals (Current goals can be found in the Care Plan section)  Acute Rehab PT Goals Patient Stated Goal: return home with family to assist PT Goal  Formulation: With patient Time For Goal Achievement: 01/14/24 Potential to Achieve Goals: Good    Frequency Min 3X/week     Co-evaluation               AM-PAC PT 6 Clicks Mobility  Outcome Measure Help needed turning from your back to your side while in a flat bed without using bedrails?: None Help needed moving from lying on your back to sitting on the side of a flat bed without using bedrails?: A Little Help needed moving to and from a bed to a chair (including a wheelchair)?: None Help needed standing up from a chair using your arms (e.g., wheelchair or bedside chair)?: None Help needed to walk in hospital room?: A Little Help needed climbing 3-5 steps with a railing? : A Little 6 Click Score: 21    End of Session   Activity Tolerance: Patient tolerated treatment well;Patient limited by fatigue Patient left: in chair;with call bell/phone within reach Nurse Communication: Mobility status PT Visit Diagnosis: Unsteadiness on feet (R26.81);Other abnormalities of gait and mobility (R26.89);Muscle weakness (generalized) (M62.81)    Time: 1610-9604 PT Time Calculation (min) (ACUTE ONLY): 30 min   Charges:   PT Evaluation $PT Eval Moderate Complexity: 1 Mod PT Treatments $Therapeutic Activity: 23-37 mins PT General Charges $$ ACUTE PT  VISIT: 1 Visit         12:24 PM, 01/10/24 Walton Guppy, MPT Physical Therapist with [facility] [phone] office [phone] mobile phone

## 2024-01-10 NOTE — Discharge Summary (Signed)
 Claudia [name], is a 68 y.o. female  DOB [birth year]  MRN [id number].  Admission date:  01/08/2024  Admitting Physician  Chales Pelissier Quintella Buck, MD  Discharge Date:  01/10/2024   Primary MD  Florinda Hush, FNP  Recommendations for primary care physician for things to follow:  1)Very Low-salt diet advised---Less than 2 gm of Sodium per day advised----ok to use Mrs DASH salt substitute instead of Salt 2)Avoid ibuprofen/Advil/Aleve/Motrin/Goody Powders/Naproxen/BC powders/Meloxicam/Diclofenac/Indomethacin and other Nonsteroidal anti-inflammatory medications as these will make you more likely to bleed and can cause stomach ulcers, can also cause Kidney problems.  3)follow up with [facility] as previously scheduled  Admission Diagnosis  Acute respiratory failure with hypoxia (HCC) [J96.01] Acute hypoxic respiratory failure (HCC) [J96.01]   Discharge Diagnosis  Acute respiratory failure with hypoxia (HCC) [J96.01] Acute hypoxic respiratory failure (HCC) [J96.01]    Principal Problem:   Acute hypoxic respiratory failure (HCC) Active Problems:   Obesity, Class III, BMI 40-49.9 (morbid obesity)   History of breast cancer   Essential hypertension      Past Medical History:  Diagnosis Date   Anxiety    Breast cancer (HCC)    Cancer of right breast (HCC)    mastectomy   Depression    GERD (gastroesophageal reflux disease)    Hiatal hernia    Hypercholesterolemia    Hypertension    Irritable bowel    PONV (postoperative nausea and vomiting)     Past Surgical History:  Procedure Laterality Date   ABDOMINAL HYSTERECTOMY     BREAST SURGERY     lumpectomy   CHOLECYSTECTOMY     FOOT SURGERY     bilateral spur removal   HARDWARE REMOVAL Left 05/29/2014   Procedure: REMOVAL OF SCREW AND K-WIRE;  Surgeon: Pleasant Brilliant, MD;  Location: [facility];  Service: Orthopedics;  Laterality: Left;   JOINT REPLACEMENT  Left 2017   Ankle   MASTECTOMY     ORIF ANKLE FRACTURE Left 10/10/2013   Procedure: OPEN REDUCTION INTERNAL FIXATION (ORIF) ANKLE FRACTURE;  Surgeon: Pleasant Brilliant, MD;  Location: [facility];  Service: Orthopedics;  Laterality: Left;   ORIF ANKLE FRACTURE Left 05/29/2014   Procedure: OPEN REDUCTION INTERNAL FIXATION (ORIF) ANKLE FRACTURE AND PLACEMENT OF BONE GRAFT MATERIAL;  Surgeon: Pleasant Brilliant, MD;  Location: [facility];  Service: Orthopedics;  Laterality: Left;   TONSILLECTOMY     TOTAL ABDOMINAL HYSTERECTOMY W/ BILATERAL SALPINGOOPHORECTOMY     TOTAL KNEE ARTHROPLASTY Left 09/20/2017   Procedure: LEFT TOTAL KNEE ARTHROPLASTY;  Surgeon: Adah Acron, MD;  Location: [facility];  Service: Orthopedics;  Laterality: Left;   TUBAL LIGATION       HPI  from the history and physical done on the day of admission:      HPI: Claudia [name] is a 68 y.o. female with medical history significant for GERD, HTN, Anxiety Disorder, HLD, IBS and history significant of metastatic breast cancer with mets to the lungs and on immunosuppressive therapy followed closely by [facility] who presents to the emergency  department due to low level of oxygen at home.  -- Patient checked her O2 sats at home and she was down to 85% on room air. - Patient does Not have home O2 - Patient has history of recent COVID-19 infection and history of recent influenza infection. No fever  Or chills , no chest pains, no palpitations, no dizziness,   -- she has chronic leg swelling left more than right but no leg pains or pleuritic symptoms   No Nausea, Vomiting or Diarrhea -- In the ED transient hypoxia noted with O2 sats in the mid to high 80s on room air at rest, I had patient walk in place for just a couple minutes and O2 sats dropped down to 83%, patient was dyspneic -- COVID, flu and influenza is negative this time around - EKG and troponin are nonacute - WBC 7.3 hemoglobin 11.4 which is baseline, platelets 185 - UA unremarkable -  BNP 82 - D-dimer elevated at 1.76 however patient has underlying malignancy - CTA chest without acute PE or pneumonia or CHF type findings - Creatinine 0.67, potassium 3.8 sodium 143, LFTs WNL   Review of Systems: As mentioned in the history of present illness. All other systems reviewed and are negative.   Hospital Course:     Brief Narrative:  68 y.o. female with medical history significant for GERD, HTN, Anxiety Disorder, HLD, IBS and history significant of metastatic breast cancer with mets to the lungs and on immunosuppressive therapy followed closely by [facility] who presents to the emergency department due to low level of oxygen at home.  -- Patient checked her O2 sats at home and she was down to 85% on room air. - Patient does Not have home O2     -Assessment and Plan: 1) Acute hypoxic respiratory failure ---?? Etiology - Patient does Not have home O2 - Patient has history of recent COVID-19 infection and history of recent influenza infection. No fever  Or chills , no chest pains, no palpitations, no dizziness,   -- she has chronic leg swelling left more than right but no leg pains or pleuritic symptoms. ---COVID, flu and influenza is negative this time around - EKG and troponin are nonacute - CTA chest without acute PE or pneumonia or CHF type findings Much improved with steroids -Patient Saturations on Room Air at Rest = 95% Patient Saturations on Room Air while Ambulating = 93% --- Hypoxia has resolved - No productive cough - Okay to discharge on prednisone , bronchodilators, mucolytics -Lower extremity venous Dopplers without acute DVT -- 2)HTN--- continue PTA BP meds  3)Metastatic breast cancer with mets to the lungs and on immunosuppressive therapy followed closely by [facility] -- --continue Arimidex    4)Anxiety Disorder--- stable, continue Zoloft , may use Xanax  as needed   5)HLD--stable, continue atorvastatin   6)GERD--stable, continue Protonix    7)  candidal intertrigo --- improved, c/n topical nystatin  as ordered   8)Morbid Obesity- -Low calorie diet, portion control and increase physical activity discussed with patient -Body mass index is 46.13 kg/m.    Disposition: The patient is from: Home              Anticipated d/c is to: Home  Discharge Condition: -stable  Follow UP   Follow-up Information     [facility] Follow up.   Specialty: Home Health Services Why: Agency will reach out to set up time for first visit. Contact information: [address] [place] [address] [phone]  Diet and Activity recommendation:  As advised  Discharge Instructions   Discharge Instructions     Call MD for:  difficulty breathing, headache or visual disturbances   Complete by: As directed    Call MD for:  persistant dizziness or light-headedness   Complete by: As directed    Call MD for:  temperature >100.4   Complete by: As directed    Diet - low sodium heart healthy   Complete by: As directed    Discharge instructions   Complete by: As directed    1)Very Low-salt diet advised---Less than 2 gm of Sodium per day advised----ok to use Mrs DASH salt substitute instead of Salt 2)Avoid ibuprofen/Advil/Aleve/Motrin/Goody Powders/Naproxen/BC powders/Meloxicam/Diclofenac/Indomethacin and other Nonsteroidal anti-inflammatory medications as these will make you more likely to bleed and can cause stomach ulcers, can also cause Kidney problems.  3)follow up with [facility] as previously scheduled   Increase activity slowly   Complete by: As directed        Discharge Medications     Allergies as of 01/10/2024       Reactions   Statins Other (See Comments)   Muscle pain        Medication List     TAKE these medications    acetaminophen  500 MG tablet Commonly known as: TYLENOL  Take 1,000 mg by mouth every 6 (six) hours as needed for headache.   albuterol  108 (90 Base)  MCG/ACT inhaler Commonly known as: VENTOLIN  HFA Inhale 2 puffs into the lungs every 6 (six) hours as needed for wheezing or shortness of breath.   alprazolam  2 MG tablet Commonly known as: XANAX  Take 2 mg by mouth in the morning, at noon, and at bedtime.   amLODipine -benazepril  10-20 MG capsule Commonly known as: LOTREL Take 1 capsule by mouth daily.   anastrozole  1 MG tablet Commonly known as: ARIMIDEX  Take 1 tablet by mouth daily.   carvedilol  12.5 MG tablet Commonly known as: COREG  Take 12.5 mg by mouth 2 (two) times daily.   esomeprazole 40 MG capsule Commonly known as: NEXIUM Take 40 mg by mouth daily.   ezetimibe  10 MG tablet Commonly known as: ZETIA  Take 10 mg by mouth at bedtime.   gabapentin  400 MG capsule Commonly known as: NEURONTIN  Take 400 mg by mouth at bedtime as needed (nerve pain, neuropathy, sleep).   hydrocortisone  2.5 % cream Apply 1 Application topically 2 (two) times daily.   nystatin  cream Commonly known as: MYCOSTATIN  Apply 1 Application topically 2 (two) times daily. What changed: Another medication with the same name was added. Make sure you understand how and when to take each.   nystatin  powder Commonly known as: MYCOSTATIN /NYSTOP  Apply 1 Application topically daily as needed (rash, infection). What changed: Another medication with the same name was added. Make sure you understand how and when to take each.   nystatin  powder Commonly known as: MYCOSTATIN /NYSTOP  Apply topically 2 (two) times daily. What changed: You were already taking a medication with the same name, and this prescription was added. Make sure you understand how and when to take each.   ondansetron  4 MG disintegrating tablet Commonly known as: ZOFRAN -ODT Take 4 mg by mouth every 8 (eight) hours as needed for nausea or vomiting.   pravastatin  40 MG tablet Commonly known as: PRAVACHOL  Take 1 tablet by mouth at bedtime.   predniSONE  20 MG tablet Commonly known as:  DELTASONE  Take 2 tablets (40 mg total) by mouth daily with breakfast for 5 days.   sertraline  100  MG tablet Commonly known as: ZOLOFT  Take 200 mg by mouth daily.       Major procedures and Radiology Reports - PLEASE review detailed and final reports for all details, in brief -   US  Venous Img Lower Bilateral (DVT) Result Date: 01/09/2024 CLINICAL DATA:  Pulmonary emboli.  Assess for residual DVT. EXAM: BILATERAL LOWER EXTREMITY VENOUS DOPPLER ULTRASOUND TECHNIQUE: Gray-scale sonography with graded compression, as well as color Doppler and duplex ultrasound were performed to evaluate the lower extremity deep venous systems from the level of the common femoral vein and including the common femoral, femoral, profunda femoral, popliteal and calf veins including the posterior tibial, peroneal and gastrocnemius veins when visible. The superficial great saphenous vein was also interrogated. Spectral Doppler was utilized to evaluate flow at rest and with distal augmentation maneuvers in the common femoral, femoral and popliteal veins. COMPARISON:  None Available. FINDINGS: RIGHT LOWER EXTREMITY Common Femoral Vein: No evidence of thrombus. Normal compressibility, respiratory phasicity and response to augmentation. Saphenofemoral Junction: No evidence of thrombus. Normal compressibility and flow on color Doppler imaging. Profunda Femoral Vein: No evidence of thrombus. Normal compressibility and flow on color Doppler imaging. Femoral Vein: No evidence of thrombus. Normal compressibility, respiratory phasicity and response to augmentation. Popliteal Vein: No evidence of thrombus. Normal compressibility, respiratory phasicity and response to augmentation. Calf Veins: No evidence of thrombus. Normal compressibility and flow on color Doppler imaging. Superficial Great Saphenous Vein: No evidence of thrombus. Normal compressibility. Venous Reflux:  None. Other Findings:  None. LEFT LOWER EXTREMITY Common Femoral Vein:  No evidence of thrombus. Normal compressibility, respiratory phasicity and response to augmentation. Saphenofemoral Junction: No evidence of thrombus. Normal compressibility and flow on color Doppler imaging. Profunda Femoral Vein: No evidence of thrombus. Normal compressibility and flow on color Doppler imaging. Femoral Vein: No evidence of thrombus. Normal compressibility, respiratory phasicity and response to augmentation. Popliteal Vein: No evidence of thrombus. Normal compressibility, respiratory phasicity and response to augmentation. Calf Veins: No evidence of thrombus. Normal compressibility and flow on color Doppler imaging. Superficial Great Saphenous Vein: No evidence of thrombus. Normal compressibility. Venous Reflux:  None. Other Findings:  None. IMPRESSION: No evidence of deep venous thrombosis in either lower extremity. Electronically Signed   By: Fernando Hoyer M.D.   On: 01/09/2024 11:41   CT Angio Chest PE W and/or Wo Contrast Result Date: 01/08/2024 CLINICAL DATA:  Shortness of breath 2 days with mild hypoxia at rest. Metastatic breast cancer to lungs. Suspect pulmonary embolism. EXAM: CT ANGIOGRAPHY CHEST WITH CONTRAST TECHNIQUE: Multidetector CT imaging of the chest was performed using the standard protocol during bolus administration of intravenous contrast. Multiplanar CT image reconstructions and MIPs were obtained to evaluate the vascular anatomy. RADIATION DOSE REDUCTION: This exam was performed according to the departmental dose-optimization program which includes automated exposure control, adjustment of the mA and/or kV according to patient size and/or use of iterative reconstruction technique. CONTRAST:  75mL OMNIPAQUE  IOHEXOL  350 MG/ML SOLN COMPARISON:  09/02/2023 FINDINGS: Cardiovascular: Left-sided Port-A-Cath present with tip over the region of the cavoatrial junction. Mild stable cardiomegaly. Calcified plaque over the left main and 3 vessel coronary arteries. Thoracic aorta  is normal caliber. No aneurysm or dissection. Mild calcified plaque over the descending thoracic aorta. Pulmonary arterial system is well opacified without evidence of emboli. Remaining vascular structures are unchanged. Mediastinum/Nodes: No significant mediastinal or hilar adenopathy. Remaining mediastinal structures are unchanged. Lungs/Pleura: Lungs are adequately inflated without acute airspace consolidation or effusion. Minimal focal atelectasis over the lingula.  Subtle stable subpleural reticulation over the right middle and upper lobe. Airways are unremarkable. Upper Abdomen: No acute findings. Calcified plaque over the abdominal aorta which is normal caliber. Prior cholecystectomy. Musculoskeletal: Evidence of previous right mastectomy with implant reconstruction. 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 Surgical clips over the right axilla. Degenerative change of the spine. Review of the MIP images confirms the above findings. IMPRESSION: 1. No evidence of pulmonary emboli or acute cardiopulmonary disease. 2. Stable cardiomegaly with left main and 3 vessel coronary artery disease. 3. Aortic atherosclerosis. 4. Evidence of previous right mastectomy with implant reconstruction. 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 Aortic Atherosclerosis (ICD10-I70.0). Electronically Signed   By: Roda Cirri M.D.   On: 01/08/2024 12:26   DG Chest Port 1 View Result Date: 01/08/2024 CLINICAL DATA:  Shortness of breath for 2 days. Hypoxia. Metastatic breast carcinoma. EXAM: PORTABLE CHEST 1 VIEW COMPARISON:  09/01/2023 FINDINGS: Normal heart size. Left-sided Port-A-Cath remains in place. Mild increase in interstitial infiltrates is seen, suspicious for interstitial edema or pneumonitis. No focal consolidation or definite pleural effusion. IMPRESSION: Mild increase in interstitial infiltrates, suspicious for interstitial edema or pneumonitis.  Electronically Signed   By: Marlyce Sine M.D.   On: 01/08/2024 11:00   Micro Results   Recent Results (from the past 240 hours)  Resp panel by RT-PCR (RSV, Flu A&B, Covid) Anterior Nasal Swab     Status: None   Collection Time: 01/08/24  2:27 PM   Specimen: Anterior Nasal Swab  Result Value Ref Range Status   SARS Coronavirus 2 by RT PCR NEGATIVE NEGATIVE Final    Comment: (NOTE) SARS-CoV-2 target nucleic acids are NOT DETECTED.  The SARS-CoV-2 RNA is generally detectable in upper respiratory specimens during the acute phase of infection. The lowest concentration of SARS-CoV-2 viral copies this assay can detect is 138 copies/mL. A negative result does not preclude SARS-Cov-2 infection and should not be used as the sole basis for treatment or other patient management decisions. A negative result may occur with  improper specimen collection/handling, submission of specimen other than nasopharyngeal swab, presence of viral mutation(s) within the areas targeted by this assay, and inadequate number of viral copies(<138 copies/mL). A negative result must be combined with clinical observations, patient history, and epidemiological information. The expected result is Negative.  Fact Sheet for Patients:  BloggerCourse.com  Fact Sheet for Healthcare Providers:  SeriousBroker.it  This test is no t yet approved or cleared by the [place]  FDA and  has been authorized for detection and/or diagnosis of SARS-CoV-2 by FDA under an Emergency Use Authorization (EUA). This EUA will remain  in effect (meaning this test can be used) for the duration of the COVID-19 declaration under Section 564(b)(1) of the Act, 21 U.S.C.section 360bbb-3(b)(1), unless the authorization is terminated  or revoked sooner.       Influenza A by PCR NEGATIVE NEGATIVE Final   Influenza B by PCR NEGATIVE NEGATIVE Final    Comment: (NOTE) The Xpert Xpress  SARS-CoV-2/FLU/RSV plus assay is intended as an aid in the diagnosis of influenza from Nasopharyngeal swab specimens and should not be used as a sole basis for treatment. Nasal washings and aspirates are unacceptable for Xpert Xpress SARS-CoV-2/FLU/RSV testing.  Fact Sheet for Patients: BloggerCourse.com  Fact Sheet for Healthcare Providers: SeriousBroker.it  This test is not yet approved or cleared by the [place]  FDA and has been authorized for detection and/or diagnosis of SARS-CoV-2 by FDA under an Emergency Use  Authorization (EUA). This EUA will remain in effect (meaning this test can be used) for the duration of the COVID-19 declaration under Section 564(b)(1) of the Act, 21 U.S.C. section 360bbb-3(b)(1), unless the authorization is terminated or revoked.     Resp Syncytial Virus by PCR NEGATIVE NEGATIVE Final    Comment: (NOTE) Fact Sheet for Patients: BloggerCourse.com  Fact Sheet for Healthcare Providers: SeriousBroker.it  This test is not yet approved or cleared by the [place]  FDA and has been authorized for detection and/or diagnosis of SARS-CoV-2 by FDA under an Emergency Use Authorization (EUA). This EUA will remain in effect (meaning this test can be used) for the duration of the COVID-19 declaration under Section 564(b)(1) of the Act, 21 U.S.C. section 360bbb-3(b)(1), unless the authorization is terminated or revoked.  Performed at [facility], [address], [place], [place] [address]    Today   Subjective    Kynzi Levay today has no new complaints  No fever  Or chills   No Nausea, Vomiting or Diarrhea  Patient Saturations on Room Air at Rest = 95% Patient Saturations on Room Air while Ambulating = 93% --- Hypoxia has resolved - Ambulating around without significant dyspnea on exertion - No chest pains, no palpitations, no  dizziness   Patient has been seen and examined prior to discharge   Objective   Blood pressure 136/68, pulse 71, temperature 97.8 F (36.6 C), temperature source Oral, resp. rate 20, height 5' 4 (1.626 m), weight 121.9 kg, SpO2 94%.   Intake/Output Summary (Last 24 hours) at 01/10/2024 1516 Last data filed at 01/10/2024 0908 Gross per 24 hour  Intake 120 ml  Output --  Net 120 ml    Exam Gen:- Awake Alert, in no acute distress, morbidly obese HEENT:- [place].AT, No sclera icterus Neck-Supple Neck,No JVD,.  Lungs-no wheezing, fair symmetrical air movement bilaterally  CV- S1, S2 normal, RRR, prior right-sided mastectomy, left-sided Port-A-Cath in situ Abd-  +ve B.Sounds, Abd Soft, No tenderness, increased truncal adiposity Extremity/Skin:-Chronic leg swelling left more than right edema,   good pedal pulses , much improved inframammary areas as well as skin folds with erythematous macular rash with satellite lesions consistent with candidal intertrigo Psych-affect is appropriate, oriented x3 Neuro-generalized weakness, no new focal deficits, no tremors   Data Review   CBC w Diff:  Lab Results  Component Value Date   WBC 7.3 01/08/2024   HGB 11.4 (L) 01/08/2024   HCT 37.1 01/08/2024   PLT 185 01/08/2024   LYMPHOPCT 20 01/08/2024   BANDSPCT 0 10/22/2012   MONOPCT 6 01/08/2024   EOSPCT 1 01/08/2024   BASOPCT 0 01/08/2024    CMP:  Lab Results  Component Value Date   NA 143 01/08/2024   K 3.8 01/08/2024   CL 107 01/08/2024   CO2 27 01/08/2024   BUN 16 01/08/2024   CREATININE 0.67 01/08/2024   PROT 6.9 01/08/2024   ALBUMIN 3.2 (L) 01/08/2024   BILITOT 0.5 01/08/2024   ALKPHOS 72 01/08/2024   AST 15 01/08/2024   ALT 14 01/08/2024  .  Total Discharge time is about 33 minutes  Colin Dawley M.D on 01/10/2024 at 3:16 PM  Go to [email or web address] -  for contact info  [facility] - Office  [phone]

## 2024-01-10 NOTE — Discharge Instructions (Signed)
 1)Very Low-salt diet advised---Less than 2 gm of Sodium per day advised----ok to use Mrs DASH salt substitute instead of Salt 2)Avoid ibuprofen/Advil/Aleve/Motrin/Goody Powders/Naproxen/BC powders/Meloxicam/Diclofenac/Indomethacin and other Nonsteroidal anti-inflammatory medications as these will make you more likely to bleed and can cause stomach ulcers, can also cause Kidney problems.  3)follow up with [facility] as previously scheduled

## 2024-01-10 NOTE — Plan of Care (Signed)
  Problem: Acute Rehab PT Goals(only PT should resolve) Goal: Pt Will Go Supine/Side To Sit Outcome: Progressing Flowsheets (Taken 01/10/2024 1225) Pt will go Supine/Side to Sit:  with modified independence  Independently Goal: Patient Will Transfer Sit To/From Stand Outcome: Progressing Flowsheets (Taken 01/10/2024 1225) Patient will transfer sit to/from stand:  with modified independence  Independently Goal: Pt Will Transfer Bed To Chair/Chair To Bed Outcome: Progressing Flowsheets (Taken 01/10/2024 1225) Pt will Transfer Bed to Chair/Chair to Bed: Independently Goal: Pt Will Ambulate Outcome: Progressing Flowsheets (Taken 01/10/2024 1225) Pt will Ambulate:  100 feet  with modified independence  with rolling walker  with least restrictive assistive device   12:26 PM, 01/10/24 Walton Guppy, MPT Physical Therapist with [facility] [phone] office [phone] mobile phone

## 2024-01-10 NOTE — Plan of Care (Signed)

## 2024-01-10 NOTE — TOC Transition Note (Signed)
 Transition of Care [facility]) - Discharge Note   Patient Details  Name: Claudia [name] MRN: [id number] Date of Birth: 29-Dec-1955  Transition of [facility] CM/SW Contact:  Grandville Lax, LCSWA Phone Number: 01/10/2024, 11:23 AM  Clinical Narrative:    CSW updated that PT is recommending HH PT for pt. CSW spoke with pt who states that she is agreeable to [facility] PT and does not have agency preference. CSW spoke to [place] with [facility] who states they can accept referral. CSW requested that MD place [facility] orders. TOC signing off.   Final next level of care: Home w Home Health Services Barriers to Discharge: Barriers Resolved   Patient Goals and CMS Choice Patient states their goals for this hospitalization and ongoing recovery are:: return home CMS Medicare.gov Compare Post Acute Care list provided to:: Patient Choice offered to / list presented to : Patient      Discharge Placement                       Discharge Plan and Services Additional resources added to the After Visit Summary for                            [facility] Arranged: PT [facility] Agency: [facility] Date [facility] Agency Contacted: 01/10/24   Representative spoke with at [facility] Agency: Randel Buss  Social Drivers of Health (SDOH) Interventions SDOH Screenings   Food Insecurity: No Food Insecurity (01/08/2024)  Housing: Low Risk  (01/08/2024)  Transportation Needs: No Transportation Needs (01/08/2024)  Utilities: Not At Risk (01/08/2024)  Financial Resource Strain: Medium Risk (05/24/2023)   Received from [facility]  Physical Activity: Inactive (12/01/2022)   Received from [facility]  Social Connections: Moderately Isolated (01/08/2024)  Stress: Stress Concern Present (12/01/2022)   Received from [facility]  Tobacco Use: Medium Risk (01/08/2024)  Health Literacy: Adequate Health Literacy (12/01/2022)   Received from [facility]     Readmission Risk  Interventions     No data to display

## 2024-01-10 NOTE — Progress Notes (Signed)
 SATURATION QUALIFICATIONS: (This note is used to comply with regulatory documentation for home oxygen)  Patient Saturations on Room Air at Rest = 95%  Patient Saturations on Room Air while Ambulating = 93%  Please briefly explain why patient needs home oxygen: Patient does not qualify for home oxygen therapy.

## 2024-04-02 NOTE — Progress Notes (Signed)
 [facility]   PRIMARY CARDIOLOGIST: Toni Glade MD   PRIMARY CARE PROVIDER:   Dyane Carlyon Norris, NP [address] [place] [address] [phone]     PATIENT PROFILE: Claudia [name]  is a 68 y.o. female with PMH metastatic breast CA (ER/PR/HER2+), obesity, former tobacco abuse, and HTN who presents for evaluation and management of cardiac risk factors in setting of breast cancer treatment: She was last seen in clinic on Oct 2024.   05/25/23 [place]  History of Present Illness  She is currently undergoing treatment with Perjeta and Herceptin every three weeks.   She has been on a stable dose of Lotrel 10/20 mg tablet nightly and carvedilol  12.5 mg twice daily for an extended period. Her blood pressure readings have been inconsistent, with a recent reading of 117/70, but it has been as low as 90/50. She monitors her blood pressure at home a few times a week.  Her cholesterol levels have been consistently high, with LDL levels always exceeding 100. She experienced muscle weakness in her arms while on statins.  She has a history of Irritable Bowel Syndrome with Diarrhea (IBSD).  She is currently unable to undergo knee replacement surgery due to weight issues and needs to lose an additional 20 pounds. She experiences breathlessness and fatigue easily. She has abstained from soda since 2020.  FAMILY HISTORY Most of her family members died from heart disease.  04/24/24 [name] Presents today for add on visit. Since we last saw her she was in the hospital at [facility] for 3 days Feb 2025 with influenza. Was then in hospital again in June 2025 at [facility] with hypoxia, unclear etiology. Treated with pred and bronchodilators.   Had done well until 2 weeks ago, had a pred burst for knee pain/OA. With this had 12 lbs wt gain and SBP to 160 at home. Normally she is very steady 110 or so on lotrel and coreg . Due to this, she stopped pred, and was started on 12.5  spiro and 12.5 HCTZ.   With this BP improved. Now past couple days is low 80-90. Wt has come back down 11 lbs. She feels much better. She continues to try to lose weight as she makes her way to a knee replacement.   HISTORY   PROBLEM LIST: Problem List  Date Reviewed: 03/07/2024        ICD-10-CM Priority Class Noted - Resolved Diagnosed   Genetic Test; Negative; Multi-Cancer Panel [facility] +RNA (08/19/2023) Z13.79   08/19/2023 - Present    Cellulitis L03.90   05/10/2023 - Present    Fungal dermatitis B36.9   05/10/2023 - Present    Low vitamin D level R79.89   05/10/2023 - Present    Encounter for monitoring cardiotoxic drug therapy Z51.81, Z79.899   05/10/2023 - Present    Breast cancer metastasized to lung, left (CMS/HHS-HCC) C50.912, C78.02   05/10/2023 - Present    Hx of right mastectomy Z90.11   11/12/2022 - Present    GAD (generalized anxiety disorder) F41.1   07/28/2022 - Present    PTSD (post-traumatic stress disorder) F43.10   07/28/2022 - Present    Abscessed tooth K04.7   Apr 24, 2021 - Present    Osteoarthritis of right knee M17.11   03/05/2021 - Present    Encounter for monitoring aromatase inhibitor therapy Z51.81, Z79.811   12/02/2020 - Present    Other hyperlipidemia E78.49   05/28/2020 - Present    Atypical chest pain R07.89   05/28/2020 - Present  Observed sleep apnea G47.30   04/19/2019 - Present    Encounter for HER2 directed therapy Z51.11   02/01/2019 - Present    Abnormal urine odor R82.90   02/01/2019 - Present    Primary malignant neoplasm of breast with metastasis (CMS/HHS-HCC) C50.919   01/05/2019 - Present    Overview Signed 10/27/2021 10:56 AM by Judyth Edelman  IMO update      Lung nodules R91.8   12/14/2018 - Present    Presence of left artificial knee joint Z96.652   09/30/2017 - Present    Essential hypertension I10   11/27/2015 - Present    Morbid obesity with BMI of 40.0-44.9, adult (CMS/HHS-HCC) E66.01, Z68.41   11/27/2015 - Present    History of iron deficiency Z86.39    09/26/2015 - Present    History of radiation therapy Z92.3   09/26/2015 - Present    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 Adequate for discharge      Right In Breast Recurrence C50.911   07/27/2006 - Present    Gastroesophageal reflux disease K21.9   09/08/2005 - Present    Overview Signed 04/21/2021  4:09 PM by Delice Lauraine Living, NP  Formatting of this note might be different from the original.      Panic disorder without agoraphobia F41.0   09/08/2005 - Present    Overview Signed 04/21/2021  4:09 PM by Delice Lauraine Living, NP  Formatting of this note might be different from the original.      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 Marital status: Divorced  . Number of children: 2  . Years of education: 27  . Highest education level: High school graduate  Occupational History  . Occupation: Statistician - writer, ambulance person  Tobacco Use  . Smoking  status: Former    Current packs/day: 0.00    Average packs/day: 1 pack/day for 34.0 years (34.0 ttl pk-yrs)    Types: Cigarettes    Start date: 40    Quit date: 2020    Years since quitting: 5.6  . Smokeless tobacco: Never  Vaping Use  . Vaping status: Never Used  Substance and Sexual Activity  . Alcohol use: No  . Drug use: No  . Sexual activity: Not Currently    Partners: Male    Birth control/protection: Abstinence, None   Social Drivers of Health  Financial Resource Strain: Medium Risk (03/07/2024)   Overall Financial Resource Strain (CARDIA)   . Difficulty of Paying Living Expenses: Somewhat hard  Food Insecurity: No Food Insecurity (03/07/2024)   Hunger Vital Sign   . Worried About Programme Researcher, Broadcasting/film/video in the Last Year: Never true   . Ran Out of Food in the Last Year: Never true  Transportation Needs: No Transportation Needs (03/07/2024)   PRAPARE - Transportation   . Lack of Transportation (Medical): No   . Lack of Transportation (Non-Medical): No  Physical Activity: Inactive (12/01/2022)   Exercise Vital Sign   . Days of Exercise per Week: 0 days   . Minutes of Exercise per Session: 0 min  Stress: Stress Concern Present (12/01/2022)   [employer] of Occupational Health - Occupational Stress Questionnaire   . Feeling of Stress : Very much  Social Connections: Moderately Isolated (01/08/2024)   Received from [facility]   Social Connection and Isolation Panel   . In a typical week, how many times do you talk on the phone with family, friends, or neighbors?: More than three times a week   . How often do you get together with friends or relatives?: Once a week   . How often do you attend church or religious services?: More than 4 times per year   . Do you belong to any clubs or organizations such as church groups, unions, fraternal or athletic groups, or school groups?: No   . How often do you attend meetings of the clubs or organizations you belong to?: Never   . Are  you married, widowed, divorced, separated, never married, or living with a partner?: Divorced  Housing Stability: Low Risk  (03/07/2024)   Housing Stability Vital Sign   . Unable to Pay for Housing in the Last Year: No   . Number of Times Moved in the Last Year: 0   . Homeless in the Last Year: No    FAMILY HX: Family History  Problem Relation Age of Onset  . High blood pressure (Hypertension) Mother   . Osteoarthritis Mother   . High blood pressure (Hypertension) Father   . Prostate cancer Father   . High blood pressure (Hypertension) Sister   . No Known Problems Brother   . Anesthesia problems Neg Hx   . Malignant hyperthermia Neg Hx   . Breast cancer Neg Hx     CURRENT MEDICATIONS AND ALLERGIES   Current Outpatient Medications  Medication Sig Dispense Refill  . acetaminophen  (TYLENOL ) 500 MG tablet Take 1,000 mg by mouth every 8 (eight) hours as needed for Pain    . albuterol  MDI, PROVENTIL , VENTOLIN , PROAIR , HFA 90 mcg/actuation inhaler     . ALPRAZolam  (XANAX ) 2 MG tablet Take 1 tablet (2 mg total) by mouth 3 (three) times daily for 30 days 90 tablet 0  . amLODIPine -benazepril  (LOTREL) 10-20 mg capsule Take 1 capsule by mouth at bedtime 30 capsule 11  . anastrozole  (ARIMIDEX ) 1 mg tablet Take 1 tablet (1 mg total) by mouth once daily 90 tablet 3  . carvediloL  (COREG ) 12.5 MG tablet Take 1 tablet (12.5 mg total) by mouth 2 (two) times daily 60 tablet 11  . cetirizine (ZYRTEC) 10 MG tablet Take 1 tablet (10 mg total) by mouth once daily 30 tablet 11  . clindamycin (CLEOCIN T) 1 % topical gel Apply topically 2 (two) times daily 60 g 1  . dicyclomine (BENTYL) 10 mg capsule Take 1 capsule (10 mg total) by mouth  as needed (before meals) 120 capsule 11  . diphenoxylate-atropine (LOMOTIL) 2.5-0.025 mg tablet take 2 tablets by mouth once daily 30 tablet 0  . ergocalciferol, vitamin D2, 1,250 mcg (50,000 unit) capsule Take 1 capsule (50,000 Units total) by mouth once a week for 16 doses  12 capsule 0  . esomeprazole (NEXIUM) 40 MG DR capsule Take 1 capsule (40 mg total) by mouth once daily 90 capsule 3  . fluticasone propionate (FLONASE) 50 mcg/actuation nasal spray Place 2 sprays into both nostrils 2 (two) times daily 16 g 11  . gabapentin  (NEURONTIN ) 400 MG capsule Take 1 capsule (400 mg total) by mouth 3 (three) times daily 90 capsule 2  . hydroCHLOROthiazide (HYDRODIURIL) 12.5 MG tablet Take 1 tablet (12.5 mg total) by mouth once daily 30 tablet 11  . hydrocortisone  2.5 % cream Apply topically 2 (two) times daily 20 g 0  . ketoconazole (NIZORAL) 2 % cream Apply topically 2 (two) times daily 60 g 3  . naloxone  (NARCAN ) 4 mg/actuation nasal spray Place 1 spray (4 mg total) into one nostril once as needed (if not breathing or overdose is suspected.) for up to 1 dose Give 2nd dose in 5-10 min if not responding or if sx return for up to 1 dose. 2 each 1  . nystatin  (MYCOSTATIN ) 100,000 unit/gram cream Apply topically 2 (two) times daily 15 g 0  . ondansetron  (ZOFRAN -ODT) 4 MG disintegrating tablet Take 1 tablet (4 mg total) by mouth every 8 (eight) hours as needed for Nausea or Vomiting 20 tablet 0  . pravastatin  (PRAVACHOL ) 40 MG tablet Take 1 tablet (40 mg total) by mouth at bedtime 30 tablet 11  . sertraline  (ZOLOFT ) 100 MG tablet Take 2 tablets (200 mg total) by mouth once daily for 90 days 60 tablet 2  . spironolactone (ALDACTONE) 25 MG tablet Take 0.5 tablets (12.5 mg total) by mouth once daily 15 tablet 11  . ZETIA  10 mg tablet Take 1 tablet (10 mg total) by mouth at bedtime 30 tablet 11  . nystatin  (MYCOSTATIN ) 100,000 unit/gram powder Apply topically 2 (two) times daily (Patient not taking: Reported on 04/03/2024) 30 g 1  . predniSONE  (DELTASONE ) 20 MG tablet 60 mg PO qd x 3 days then 40 mg PO qd x 3 days then 20 mg PO qd x 3 days (Patient not taking: Reported on 04/03/2024) 18 tablet 0  . semaglutide (WEGOVY) 0.25 mg/0.5 mL pen injector Inject 0.5 mLs (0.25 mg total)  subcutaneously once a week for 4 doses (Patient not taking: Reported on 04/03/2024) 2 mL 0   No current facility-administered medications for this visit.    No Known Allergies   REVIEW OF SYSTEMS  Comprehensive review of systems is otherwise negative except as indicated below and in the HPI  Constitutional: []  fever, []  rigors, []  nightsweats, []  unexpected weight change, []  fatigue, []  loud snoring, []  daytime somnolence  Opthalmologic: []  vision changes  Otolaryngologic: []  recent hearing changes  Oropharnyx: []  congestion []  oral sores/ulcers  Respiratory: []  dyspnea, []  cough, []  hemoptysis  Cardiovascular: []  chest pain, []  palpitations, []  orthopnea, []  PND,   []  peripheral edema, []  syncope, []  leg pain with walking  Gastrointestinal:              []  abdominal pain, []  nausea, []  vomiting, []  diarrhea,  []  constipation, []  melena, []  acid reflux  Genitourinary: []  dysuria, []  hematuria, []  nocturia  Musculoskeletal:             [  x] joint pain, [x]  difficulty walking  Neurologic:   Endocrine:            []  headache, []  neuropathy, []  localized weakness []  excessive urination, []  excessive thirst  Hematologic: []  bruising/bleeding   Dermatologic: []  skin excoriations []  rash   PHYSICAL EXAM   Most recent:  Vitals:   04/03/24 0935 04/03/24 0944  BP: 98/61 95/62  BP Location: Left forearm Left forearm  Patient Position: Sitting Standing  BP Cuff Size: Adult Adult  Pulse: 73   Resp: 16   Temp: 36.8 C (98.2 F)   TempSrc: Oral   SpO2: 94%   Weight: (!) 115.7 kg (255 lb 1.2 oz)   Height: 162.6 cm (5' 4)      Trend over last 5 encounters:  Wt Readings from Last 5 Encounters:  04/03/24 (!) 115.7 kg (255 lb 1.2 oz)  03/23/24 (!) 117.8 kg (259 lb 11.2 oz)  03/07/24 (!) 120 kg (264 lb 9.6 oz)  03/02/24 (!) 120.8 kg (266 lb 5.1 oz)  03/01/24 (!) 120 kg (264 lb 9.6 oz)   BP Readings from Last 5 Encounters:  04/03/24 95/62  03/23/24 115/74  03/23/24 139/80  03/07/24  119/72  03/02/24 109/69   Pulse Readings from Last 5 Encounters:  04/03/24 73  03/23/24 74  03/23/24 74  03/07/24 71  03/02/24 66    Body mass index is 43.78 kg/m.     General Appearance:  Alert, cooperative, no distress, appears stated age. Obese   HEENT:  PERRL,oropharynx clear  Neck: Carotids 2+ bilaterally, no carotid bruits, JVP is normal  Lungs:   Clear to auscultation bilaterally, respirations unlabored  Heart:  Regular rate and rhythm, normal S1 and S2, no murmurs/rubs/gallops, non-displaced PMI, no RV heave  Abdomen:   Soft, non-tender, bowel sounds active,  no masses, no organomegaly  Extremities: Extremities normal, no cyanosis or edema.  Pulses: Dorsalis pedis 2+ and symmetric bilaterally  Skin: No lower extremity rashes or ulcers  Neurologic: Alert, interactive, and appropriate, grossly moving all 4 extremities   Physical Exam   LABS    Recent Labs    05/30/21 1110 09/16/22 1655 11/11/23 1055  CHOLTOTAL 147   < > 143  HDL 37   < > 36  LDLCALC  --    < > 84  LDL 88  --   --   TRIG 105   < > 116   < > = values in this interval not displayed.    Recent Labs    01/19/24 1050 02/08/24 1214 03/02/24 1351 03/23/24 0749 04/03/24 1027  NA 140 138 139 139 139  K 3.4* 4.0 4.2 3.5 3.9  CL 106 106 106 107 100  CO2 28 27 25 25 28   BUN 13 17 21* 19 19  CREATININE 0.7 0.8 1.0 0.7 1.3*  BUNCRE 19 21 21 27 15   GFR 95 81 62 95 45  GLUCOSE 110 87 94 88 102  ALT 17 14 15 16   --   AST 19 19 19 18   --   TBILI 0.4 0.5 0.6 0.6  --   ALB 3.2* 3.4* 3.6 3.5  --     Recent Labs    02/08/24 1214 03/02/24 1351 03/23/24 0749  WBC 7.5 7.2 11.7*  HGB 11.4* 11.4* 11.7  HCT 37.0 37.1 37.8  PLT 195 209 245    Recent Labs    11/11/23 1055 12/07/23 1216  TSH 0.18* 0.67  HGBA1C 5.4  --  Recent Labs    04/03/24 1027  PROBNP 77    No results for input(s): IRON, TIBC, PCTSAT, FERRITIN in the last [id number] hours.    05/30/21 TTE   INTERPRETATION NORMAL LEFT VENTRICULAR SYSTOLIC FUNCTION (EF >55%, GLS -17.6%) NORMAL LA PRESSURES WITH NORMAL DIASTOLIC FUNCTION NORMAL RIGHT VENTRICULAR SYSTOLIC FUNCTION VALVULAR REGURGITATION: TRIVIAL MR, TRIVIAL TR NO VALVULAR STENOSIS Compared with prior Echo study on 01/23/2020: Overall no changes  TTE 05/2022 INTERPRETATION ---------------------------------------------------------------   NORMAL LEFT VENTRICULAR SYSTOLIC FUNCTION WITH MILD LVH   NORMAL LA PRESSURES WITH NORMAL DIASTOLIC FUNCTION   NORMAL RIGHT VENTRICULAR SYSTOLIC FUNCTION   VALVULAR REGURGITATION: MILD MR, TRIVIAL PR, TRIVIAL TR   NO VALVULAR STENOSIS   3D acquisition and reconstructions were performed as part of this   examination to more accurately quantify the effects of Pre/Post Chemo,   Radiation or other therapy. (post-processing on an Independent   workstation).     Compared with prior Echo study on 05/30/2021: NO SIGNIFICANT CHANGE  ASSESSMENT AND PLAN  Ms. Leisner is a 68 y.o. female who is seen in clinic today for the below problems:  1. 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 Shortness of breath   3. Primary malignant neoplasm of breast with metastasis (CMS/HHS-HCC)   4. Essential hypertension    Assessment & Plan HTN HLD with statin-associated side effects  Former tobacco abuse Coronary artery calcifications She has been following with our clinic since referral in 2021 after TTE showed small regional WMA (inferoseptal) with normal EF. This was in the setting of treatment for breast cancer with Perjeta along wth multiple cardiac risk factors (HTN, HLD, obesity, former smoking) and coronary calcifications on CT. She had cMRI stress March 2022 with normal function and no inducible ischemia and TTE in 11/22 demonstrated normal LV function and wall motion.  Risk factor management:  - BP: Very well controlled on historical regimen of lotrel/coreg . However recent HTN in setting of  steroid use for knee pain. With this she was continued on Lotrel/coreg  and started low dose HCTZ and spiro. Labs today showing Cr up likely due to both thiazide and MRA along with fairly significant reduction in BP/renal perfusion. Today will hold both for 1 day then resume HCTZ at 12.5 while continuing to hold spiro. We will reach out in 1-2 weeks to check on home BPs  - HLD: Cont pravastatin  and Zetia . Last LDL 84 April 2025. Recheck next visit  - Encouraged diet/exercise and wt loss to decrease risk factors. Pt planning to increase exercise   She is currently on Lotrel 10/20 mg tablet every night and carvedilol  12.5 mg twice a day. Her blood pressure is generally well-controlled but tends to drop when she is dehydrated, often due to IBS-D. She is advised to stay hydrated and may hold off on her blood pressure medication if her blood pressure is low or if she is experiencing diarrhea. She will continue to monitor her blood pressure at home. Changes today as above.   She is on pravastatin  40 mg daily and Zetia  (ezetimibe ) 10 mg daily. Her LDL cholesterol is <100, and her HDL cholesterol has improved.   Stage IV ER+/HER2+ breast cancer Continues to follow with oncology for treatment (arimidex , herceptin, Perjeta) with no evidence of new metastatic disease  Her last echo March 2025 demonstrated normal systolic function with ejection fraction of 59%, global longitudinal strain of -17.7% and normal RV systolic function. She has mild LVH which is stable. Overall, she has had stable  in normal range cardiac function on trastuzumab and pertuzumab over recent years and we discussed that she could continue to have surveillance echocardiograms annually unless there is any change in her symptoms  Diarrhea/Hypokalemia/IBS/hypotension Her blood pressure tends to drop significantly when she experiences bouts of diarrhea due to IBS-D, leading to dehydration. She is advised to stay hydrated and may hold off on her  blood pressure medication during these episodes to prevent further drops in blood pressure. No recent flares.   Knee Pain. She is experiencing significant pain in her knee, which affects her mobility. She is unable to take ibuprofen due to a history of bleeding ulcers. She needs to lose about 20 more pounds before the surgery can be considered. She is following with orthopedics.   1. 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 Shortness of breath -     Basic Metabolic Panel (BMP); Future -     Pro-Brain Natriuretic peptide, N-Terminal (NT-pro-BNP); Future  3. Primary malignant neoplasm of breast with metastasis (CMS/HHS-HCC)  4. Essential hypertension   Orders Placed This Encounter  Procedures  . Basic Metabolic Panel (BMP)  . Pro-Brain Natriuretic peptide, N-Terminal (NT-pro-BNP)    DISPOSITION   Ms.  Claudia [name] will return to clinic in 6 weeks as scheduled for follow up. In the meantime, she was instructed to call for any questions or concerns.   Future Appointments     Date/Time Provider Department Center Visit Type   04/07/2024 1:50 PM (Arrive by 1:35 PM) Teena Camellia BROCKS, MD [facility] [place] VIDEO VISIT RETURN   04/11/2024 11:30 AM (Arrive by 11:15 AM) Gwenyth Duwaine Denmark, MD [facility] VIDEO VISIT RETURN   04/13/2024 6:40 AM PORT NURSE [facility] Lab Draw [facility] LAB   04/13/2024 7:30 AM CC NM INJ 1N04 [facility]  Nuc Med [facility] NM BONE SCAN WHOLE BODY INJ   04/13/2024 8:20 AM (Arrive by 8:00 AM) CC CT 1 [facility] Center  CT Cancer Ctr CT CHEST ABD PEL W INCL MIPS   04/13/2024 9:00 AM (Arrive by 8:45 AM) Cornelison, Burnard Helling, PA [facility] RETURN VISIT   04/13/2024 9:30 AM CC NM SR 1N09 [facility]  Nuc Med [facility] NM BONE SCAN WHOLE BODY  SCAN   04/13/2024 10:30 AM (Arrive by 10:00 AM) OTC [facility] Oncology Treatment [facility] Ctr ONCOLOGY TX 2HR   04/24/2024 1:00 PM (Arrive by 12:45 PM) POPULATION HEALTH NURSE - [facility] TFP VIDEO VISIT FERDIE   05/04/2024 9:50 AM (Arrive by 9:35 AM) PORT NURSE [facility] Lab Draw [facility] LAB   05/04/2024 11:00 AM (Arrive by 10:45 AM) Cornelison, Burnard Helling, PA [facility] RETURN VISIT   05/04/2024 1:00 PM (Arrive by 12:30 PM) OTC [facility] Oncology Treatment [facility] ONCOLOGY TX 2HR   05/16/2024 12:40 PM (Arrive by 12:10 PM) Francina Toni Gatlin, MD [facility] CARD RETURN   05/25/2024 8:40 AM (Arrive by 8:25 AM) PORT NURSE [facility] Lab Draw [facility] LAB   05/25/2024 10:00 AM (Arrive by 9:45 AM) Cornelison, Burnard Helling, PA [facility] RETURN VISIT   05/25/2024 11:00 AM (Arrive by 10:30 AM) [facility]TC [facility] Oncology Treatment [facility] ONCOLOGY TX 2HR   07/25/2024 8:45 AM (Arrive by 8:30 AM) [facility]AM [facility]tr MM SCREEN BREAST TOMO LT   09/14/2024 10:40 AM (Arrive by 10:25 AM) Coppolino, Lauraine Lacks, PA [facility] VIDEO VISIT NEW   02/22/2025 2:20 PM (Arrive by 1:50 PM) 40 MIN, PULM LAB [facility] SPIROMETRY/DLCO/LUNG VOLUMES   02/22/2025 3:20 PM (Arrive by 2:50 PM) Marilyn Arvil Larger, MD [facility] RETURN VISIT      I spent a total of 30 minutes in both face-to-face and non-face-to-face activities, excluding procedures performed, for this visit on the date of this encounter.  Medication list was reviewed with patient, changes/additions updated at time of visit, and prescriptions refilled appropriately per e-prescribing system. There were no barriers to communication. She understands to call with any questions or concerns.  She was educated regarding dx, tx plan, medications and possible side effects. She was educated regarding emergent signs and symptoms, and knows to notify our office immediately or seek emergent medical attention if necessary. She voiced understanding and agreement with tx plan.  I personally performed the service, non-incident to. (WP)   TODD MICHAEL [name], PA   TODD OZELL KIDNEY, PA  Cardiology Physician Assistant  [facility]  Cell: [phone] Office: [phone]

## 2024-05-16 NOTE — Consults (Signed)
 I called to discuss the patients MRI results. 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 We will not move forward with SBRT at this time given the size of lesion that needs to be treated.   I discussed the risks and benefits of palliative fractionated EBRT to the site of disease in the thoracic spine.   I will discuss her plan with her medical oncologist to see if there are systemic therapy changes planned and for a multidisciplinary discussion regarding best next steps.   UPDATE: After discussion with medical oncology; systemic therapy will be changed. Ms. Shipp will have a discussion with the medical oncology team Thursday. As she is asymptomatic, we will plan to hold off on RT for now. Medical oncology will order interval imaging to access response to systemic therapy. I will be happy to see her again in the future should there be need.   Tiarrah Salvi-Jackson MD

## 2024-05-18 NOTE — Progress Notes (Signed)
 Note to patient:  The purpose of this note is to communicate issues about your care to medical providers in your care team.  It is written using standard medical terminology and may include information about issue not discussed at your visit but important to consider during your care.  If you have questions regarding details of the note, please schedule an appointment to discuss with your provider.        [facility] Multidisciplinary Breast Program Medical Oncology  PATIENT IDENTIFICATION   Claudia [name] is a 68 y.o. from [place] [address]. Her younger sister's name is Jon.   CARE TEAM   PCP:  Dyane Carlyon [place], NP [address] [place] [address] Pulm: Romelle   Breast Med Onc:  Marcom / Force / Dent / Kimmick / Floretta  ONCOLOGY and RELATED, PERTINENT HISTORY       1.  Metastatic breast cancer  Right breast cancer 1998 at age 17  - lumpectomy and ALND: 3+ LN  - completed adjuvant chemotherapy  - completed adjuvant RT  - adjuvant tamoxifen  Locally recurrent right breast cancer 2008 at age 46  - 1 cm of recurrent cancer in the right breast  - s/p right mastectomy with flap reconstruction and left breast mastopexy.  - adjuvant OS + anastrozole ; oophorectomy in 2010   Metastatic breast cancer 2020  - presented 06/2018 with dyspnea and CT finding pulmonary nodules  - CT 11/25/2018:  9 mm R intrabronchial mass, ? Lymphangitic process  - bronchoscopy with biopsy 01/05/2019:  ER (69%) / PR (63%), HER2 (3+ by IHC; FISH amplified with ratio 2.05 and 6.2 copies per cell) - PET 01/23/2019:  metastases to right hilar and mediastinal LN, pulmonary nodules; multifocal bone metastases.   - vinorelbine / HP 02/01/2019-06/13/2019; stable by PET/CT 06/27/2019;  Anastrozole  / HP alone since 06/2019; no progression by PET/CT 10/10/2019, 01/23/2020, 06/27/2019, 10/10/2019, 01/23/2020, 05/07/2020; ? COVID by PET 09/05/2020; no clear progression by CT 12/03/2020,  02/25/2021, 06/10/2021, CT and bone scan 10/14/2021, 01/06/2022, 04/17/2022; new L breast mass by CT and bone scan 06/07/2023 (benign diagnostic left breast w/u and left ax bx 09/29/2022); new left intrarenal lesion by CT 10/28/2022 (complicated cyst by MRI 12/09/2022); no progression by CT or bone scan 02/10/2023, 05/05/2023, or 08/11/2023, no progression by CT 10/13/2023 - 04/2024: new T4 vertebral body lesion on MRI seen with extension into prevertebral space from T1-T2 through T5-T6. Transition therapy to fulvestrant + HP.   2.  Premenopausal at diagnosis of breast cancer S/p oophorectomy in 2010   3.  Cardiac function by echocardiogram  01/24/2019 EF 60%   06/27/2019 EF 52%  10/10/2019 EF 52%  01/23/2020 EF 57%  06/04/2022 EF 57%  10/28/2022 EF 59%            10/13/2023 EF 58%  4.  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 We discussed RT to new T4 lesion with plans to continue current therapy. She was seen with Dr. Emmett and MRI spine was ordered to further characterize the T4 lesion. MRI spine showed T4 lesion with extension into the prevertebral space with masslike contrast enhancement extending from T1-T2 through T5-T6. Patient presents today to review these results and discuss treatment planning. She presents accompanied by her sister and daughter.   She experiences occasional transient back pain  upon waking, attributed to her sleeping position, but no significant pain in the upper back area. Her current pain level is primarily due to knee pain, rated as 7 out of 10. She notes ongoing weight loss efforts so that she may undergo knee replacement surgery at a later date.   She experiences severe anxiety and panic attacks. She prefers not to be informed about potential side effects to avoid anxiety-induced symptoms. She prefers her sister be  informed and she is working on completing paperwork for her to be her medical power of attorney.   HISTORY AND MEDICATIONS   Past Medical History:  Diagnosis Date  . Anesthesia complication    Difficult to sedate and difficult to awaken.  . Arthritis 2010?  [name] Breast cancer (CMS/HHS-HCC)   . Cellulitis of buttock 03/16/2019  . Cellulitis of face 04/01/2021  . Cholecystitis idk   gallbladder removed  . 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 Depression   . Encounter for monitoring aromatase inhibitor therapy 12/02/2020  . Essential hypertension 11/27/2015  . History of radiation therapy   . Hyperlipidemia   . Hypertension   . Morbid obesity with BMI of 40.0-44.9, adult (CMS-HCC) 11/27/2015  . Observed sleep apnea 04/19/2019  . Panic disorder   . PONV (postoperative nausea and vomiting)    Past Surgical History:  Procedure Laterality Date  . MASTECTOMY Right 2006   with reconstruction  . OOPHORECTOMY  2010  . ARTHROPLASTY TOTAL ANKLE Left 11/29/2015   Procedure: ARTHROPLASTY, ANKLE; WITH IMPLANT (TOTAL ANKLE);  Surgeon: Lynwood Francis Amend, MD;  Location: [facility] OR;  Service: Orthopedics;  Laterality: Left;  . INTRAOPERATIVE FLUOROSCOPY N/A 11/29/2015   Procedure: FLUOROSCOPY (SEPARATE PROCEDURE), UP TO 1 HOUR PHYSICIAN OR OTHER QUALIFIED HEALTH CARE PROFESSIONAL TIME, OTHER THAN [phone] OR [phone] (EG, CARDIAC FLUOROSCOPY);  Surgeon: Lynwood Francis Amend, MD;  Location: [facility] OR;  Service: Orthopedics;  Laterality: N/A;  . OSTEOTOMY TIBIAL SHAFT Left 11/29/2015   Procedure: OSTEOTOMY; TIBIAL SHAFT;  Surgeon: Lynwood Francis Amend, MD;  Location: [facility] OR;  Service: Orthopedics;  Laterality: Left;  . ARTHROPLASTY TOTAL KNEE Left 08/2017  . BRONCHOSCOPY Bilateral 01/05/2019   Procedure: BRONCHOSCOPY, RIGID OR FLEXIBLE, INCLUDING FLUOROSCOPIC GUIDANCE, WHEN PERFORMED;  WITH EXCISION OF TUMOR;  Surgeon: Romelle Havens, MD;  Location: [facility];  Service: Pulmonary;  Laterality: Bilateral;  . EGD N/A 03/17/2019   Procedure: EGD;  Surgeon: Dufault, Darin Lyn, MD;  [facility];  Service: Gastroenterology;  Laterality: N/A;  . EXTRACTION TEETH Bilateral 04/25/2021   Procedure: EXTRACTION TEETH: 2,3,6,13,14,19,28,31 [id number] x 8;  Surgeon: Ciarrocca, Katharine Newman, DMD;  Location: [facility] OR;  Service: Plastic Surgery;  Laterality: Bilateral;  . REPAIR TOOTH SOCKET Bilateral 04/25/2021   Procedure: ALVEOLOPLASTY: bilateral maxillary CPT 41874 x 2;  Surgeon: Ciarrocca, Katharine Newman, DMD;  Location: [facility] OR;  Service: Plastic Surgery;  Laterality: Bilateral;  . BIOPSY TONGUE Bilateral 04/25/2021   Procedure: BIOPSY OF TONGUE; POSTERIOR ONE-THIRD;  Surgeon: Ciarrocca, Katharine Newman, DMD;  Location: [facility] OR;  Service: Plastic Surgery;  Laterality: Bilateral;  . BREAST BIOPSY    . breast lumpectomy Right   . BREAST SURGERY    . CHOLECYSTECTOMY    . FRACTURE SURGERY Left    ankle  . HYSTERECTOMY     vaginal  . JOINT REPLACEMENT    . LAPAROSCOPIC SALPINGO-OOPHORECTOMY Bilateral   .  LYMPH NODE BIOPSY    . TONSILLECTOMY    . TUBAL LIGATION     No Known Allergies  Outpatient Encounter Medications as of 05/18/2024  Medication Sig Dispense Refill  . acetaminophen  (TYLENOL ) 500 MG tablet Take 1,000 mg by mouth every 8 (eight) hours as needed for Pain    . albuterol  MDI, PROVENTIL , VENTOLIN , PROAIR , HFA 90 mcg/actuation inhaler     . ALPRAZolam  (XANAX ) 2 MG tablet Take 1 tablet (2 mg total) by mouth 3 (three) times daily for 30 days 90 tablet 0  . amLODIPine -benazepril  (LOTREL) 10-20 mg capsule Take 1 capsule by mouth at bedtime 30 capsule 11  . anastrozole  (ARIMIDEX ) 1 mg tablet Take 1 tablet (1 mg total) by mouth once daily 90 tablet 3  . carvediloL  (COREG ) 12.5 MG tablet Take 1 tablet (12.5 mg total) by mouth 2 (two)  times daily 60 tablet 11  . cetirizine (ZYRTEC) 10 MG tablet Take 1 tablet (10 mg total) by mouth once daily (Patient taking differently: Take 10 mg by mouth as needed for Allergies or Rhinitis) 30 tablet 11  . clindamycin (CLEOCIN T) 1 % topical gel Apply topically 2 (two) times daily 60 g 1  . dicyclomine (BENTYL) 10 mg capsule Take 1 capsule (10 mg total) by mouth as needed (before meals) 120 capsule 11  . diphenoxylate-atropine (LOMOTIL) 2.5-0.025 mg tablet take 2 tablets by mouth once daily 30 tablet 0  . ergocalciferol, vitamin D2, 1,250 mcg (50,000 unit) capsule Take 1 capsule (50,000 Units total) by mouth once a week for 16 doses 12 capsule 0  . esomeprazole (NEXIUM) 40 MG DR capsule Take 1 capsule (40 mg total) by mouth once daily 90 capsule 3  . fluticasone propionate (FLONASE) 50 mcg/actuation nasal spray Place 2 sprays into both nostrils 2 (two) times daily 16 g 11  . gabapentin  (NEURONTIN ) 400 MG capsule Take 1 capsule (400 mg total) by mouth 3 (three) times daily 90 capsule 2  . hydroCHLOROthiazide (HYDRODIURIL) 12.5 MG tablet Take 1 tablet (12.5 mg total) by mouth once daily 30 tablet 11  . hydrocortisone  2.5 % cream Apply topically 2 (two) times daily 20 g 0  . naloxone  (NARCAN ) 4 mg/actuation nasal spray Place 1 spray (4 mg total) into one nostril once as needed (if not breathing or overdose is suspected.) for up to 1 dose Give 2nd dose in 5-10 min if not responding or if sx return for up to 1 dose. 2 each 1  . ondansetron  (ZOFRAN -ODT) 4 MG disintegrating tablet Take 1 tablet (4 mg total) by mouth every 8 (eight) hours as needed for Nausea or Vomiting 20 tablet 3  . pravastatin  (PRAVACHOL ) 40 MG tablet Take 1 tablet (40 mg total) by mouth at bedtime 30 tablet 11  . sertraline  (ZOLOFT ) 100 MG tablet Take 2 tablets (200 mg total) by mouth once daily for 90 days 60 tablet 2  . ZETIA  10 mg tablet Take 1 tablet (10 mg total) by mouth at bedtime 30 tablet 11  . [DISCONTINUED] nystatin   (MYCOSTATIN ) 100,000 unit/gram powder Apply topically 2 (two) times daily 30 g 1  . ketoconazole (NIZORAL) 2 % cream Apply topically 2 (two) times daily 60 g 3  . nystatin  (MYCOSTATIN ) 100,000 unit/gram cream Apply topically 2 (two) times daily 60 g 3  . predniSONE  (DELTASONE ) 20 MG tablet 60 mg PO qd x 3 days then 40 mg PO qd x 3 days then 20 mg PO qd x 3 days (Patient not taking: Reported  on 05/18/2024) 18 tablet 0  . semaglutide (WEGOVY) 0.25 mg/0.5 mL pen injector Inject 0.5 mLs (0.25 mg total) subcutaneously once a week for 4 doses (Patient not taking: Reported on 04/03/2024) 2 mL 0  . spironolactone (ALDACTONE) 25 MG tablet Take 0.5 tablets (12.5 mg total) by mouth once daily 15 tablet 11  . [DISCONTINUED] nystatin  (MYCOSTATIN ) 100,000 unit/gram cream Apply topically 2 (two) times daily (Patient not taking: Reported on 05/18/2024) 30 g 0   No facility-administered encounter medications on file as of 05/18/2024.   PHYSICAL EXAMINATION   Vital signs:  BP 117/72 (BP Location: Left upper arm, Patient Position: Sitting, BP Cuff Size: Large Adult)   Pulse 77   Temp 37 C (98.6 F) (Oral)   Resp 18   Wt (!) 113.5 kg (250 lb 3.6 oz)   SpO2 95%   BMI 42.95 kg/m  BSA: Body surface area is 2.26 meters squared. ECOG Performance Status:   2-3  Pleasant woman sitting comfortably on the exam table.   Benign limited skin survey. No open lesions or rashes.  Head and neck:  Sclera anicteric. Moist mucous membranes without lesions or discharge. Neck is supple without occipital, cervical, or supraclavicular adenopathy.    Chest/Lungs:  Clear bilaterally. No increased work of breathing     Musculoskeletal:  No tenderness over the spine.   Breasts: +Candidal intertrigo. Abdomen:  Nontender.   Extremities:  No significant ankle edema, no redness or draining lesions. No palpable cords. Neurologic:   No overt neurologic deficits.     LABS/STUDIES  All pertinent labs/imaging was personally reviewed.    ASSESSMENT & PLAN   1.  Hormone receptor positive, HER2 positive, metastastic breast cancer.    - 04/13/24 CT CAP and NM bone scan reviewed with patient. Notable for slight progression in posterior mediastinal mass (<0.5 cm over 6 months) and a new T4 lesion. Seen with Radiation Oncology to discuss RT to this lesion. On additional imaging with MRI, lesion more extensive than originally anticipated with extension into prevertebral space from T1-T2 through T5-T6. Would require more fractions than simple SBRT. Discussed instead to proceed with changing systemic therapy from Anastrozole  + HP to fulvestrant + HP. Reviewed that generally fulvestrant is well tolerated but possible side effects include injection site pain, fatigue, nausea, bone pain or muscle aches, hot flashes, mild diarrhea or constipation, hot flashes, increased liver enzymes, allergic reaction, among others. Provided printed information sheet on fulvestrant. Discussed deferring RT for now given asymptomatic, but can consider in the future if she develops pain.   - Repeat CT CAP, NM bone scan in ~3 months to assess response.   - RTC 06/15/24 for follow up and toxicity check.  2. Candidal intertrigo. Discussed keeping area clean/dry, wear loose clothing. Prescribed nystatin  cream - patient notes most benefit with this. If persistent/refractory, consider PO fluconazole. Recommend follow up with PCP.   3. SOB - multifactorial, stable - Hospitalized for acute respiratory failure 12/2023 without known trigger. Discharged on albuterol  and steroids with improvement.  - Seen by pulmonology 02/24/24 and SOB though to be s/t obesity, deconditioning, and hypertension. - Per pulm, recommend regular exercising, Flonase, Zyrtec - Albuterol  PRN before exercising.   4. Osteoporosis risk / low vitamin D  - DXA 12/09/2023: Normal BMD  - Continue daily vitamin D and calcium supplementation  5. Genetics  - Genetic testing negative 08/19/2023  6.  H/o  borderline EF during anti-HER2 therapy.              -  Per cards, can get annual ECHOs as long as no symptoms             - Echocardiogram 10/13/2023: Calc EF 58%, GLS -18.  - Followed by Dr. Francina  7. Thyroid                -Completed FNA and biopsy 09/17/2023. Pathology came back negative  8. Rash/irritation under left breast/left lower abdomen c/w dermatitis             - pt reports much improvement after switching to nystatin  powder  - Continue nystatin  PRN  9. Right knee pain            - Referral to PT previously placed to [facility] in [place], [place].  Referral resent today as patient reports she has not been able to schedule appointment due to recent travel. Encouraged she schedule as soon as she is able.   - Brief visit with pain management 5/15, F/u scheduled 03/2024  - Followed with her orthopedic surgeon 03/01/24. Saw joint specialist 03/07/24.  - Patient participated in home PT with good results.  10. Obesity  - Recommend healthy diet choices, regular meal times  - Continue follow up with Dr. Teena with [facility], congratulated her efforts and weight loss.   11. Left renal cyst               - Referred to GU-onc  - MRI Abdomen 02/09/24: Interval slight increase in size of a minimally complex hemorrhagic/proteinaceous cyst in the left mid kidney.  - Per urology, follow up PRN  All questions were answered. She understands and agrees with the plan, as above. She was advised to call with questions or concerns that arise prior to the next scheduled appointment.  I personally examined this patient.   I spent a total of 50 minutes in both face-to-face and non-face-to-face activities for this visit on the date of this encounter.  Kandyce Archer, MD Breast Medical Oncology

## 2024-05-22 NOTE — Progress Notes (Signed)
 [facility] Psychiatry-Oncology   This video encounter was conducted with the patient's (or proxy's) verbal consent via secure, interactive audio and video telecommunications while in clinic/office/hospital.  The patient (or proxy) was instructed to have this encounter in a suitably private space and to only have persons present to whom they give permission to participate. In addition, patient identity was confirmed by use of name plus two identifiers.  07-06-20 E&M) This visit was coded based on time. I spent a total of 22 minutes in both face-to-face and non-face-to-face activities for this visit on the date of this encounter.  Chief Complaint:   Claudia [name] is a 68 y.o. year old female w HER2+ MBC who returns for follow-up medication management visit for the treatment of anxiety.   HPI/Subjective:    Pt shares that she has had a rough two or three weeks.  A recent MRI showed more disease burden than initially anticipated; rather than RT, pt will begin systemic therapy.  She admits to experiencing a panic attack on initially hearing the news, but is calmer now, able to focus on her provider's positivity and the fact that there is a treatment plan in place.  No SI or passive death wishes.  Pt states that she is back to me, back to my optimistic self.  She demonstrates good insight and self-advocacy skills.  She is intentionally losing weight (20 lbs in last 8 weeks) and looking forward to meeting BMI requirement for knee replacement.  Pt is tolerating current medication regimen without acute side effects.  She did use additional alprazolam  (mdd 8mg ) for a day or two during the first week after receiving the MRI results but returned to using mdd 6mg  without issue.  Objective:   Past Psychiatric Medication Trials: Alprazolam  Sertraline  ------------------ Fluoxetine  Past Psychotherapy Trials: Individual therapy   Medications: Current Outpatient Medications  Medication Sig  Dispense Refill  . acetaminophen  (TYLENOL ) 500 MG tablet Take 1,000 mg by mouth every 8 (eight) hours as needed for Pain    . albuterol  MDI, PROVENTIL , VENTOLIN , PROAIR , HFA 90 mcg/actuation inhaler     . ALPRAZolam  (XANAX ) 2 MG tablet Take 1 tablet (2 mg total) by mouth 3 (three) times daily for 30 days 90 tablet 0  . amLODIPine -benazepril  (LOTREL) 10-20 mg capsule Take 1 capsule by mouth at bedtime 30 capsule 11  . carvediloL  (COREG ) 12.5 MG tablet Take 1 tablet (12.5 mg total) by mouth 2 (two) times daily 60 tablet 11  . cetirizine (ZYRTEC) 10 MG tablet Take 1 tablet (10 mg total) by mouth once daily (Patient taking differently: Take 10 mg by mouth as needed for Allergies or Rhinitis) 30 tablet 11  . clindamycin (CLEOCIN T) 1 % topical gel Apply topically 2 (two) times daily 60 g 1  . dicyclomine (BENTYL) 10 mg capsule Take 1 capsule (10 mg total) by mouth as needed (before meals) 120 capsule 11  . diphenoxylate-atropine (LOMOTIL) 2.5-0.025 mg tablet take 2 tablets by mouth once daily 30 tablet 0  . ergocalciferol, vitamin D2, 1,250 mcg (50,000 unit) capsule Take 1 capsule (50,000 Units total) by mouth once a week for 16 doses 12 capsule 0  . esomeprazole (NEXIUM) 40 MG DR capsule Take 1 capsule (40 mg total) by mouth once daily 90 capsule 3  . fluticasone propionate (FLONASE) 50 mcg/actuation nasal spray Place 2 sprays into both nostrils 2 (two) times daily 16 g 11  . gabapentin  (NEURONTIN ) 400 MG capsule Take 1 capsule (400 mg total) by  mouth 3 (three) times daily 90 capsule 2  . hydroCHLOROthiazide (HYDRODIURIL) 12.5 MG tablet Take 1 tablet (12.5 mg total) by mouth once daily 30 tablet 11  . hydrocortisone  2.5 % cream Apply topically 2 (two) times daily 20 g 0  . ketoconazole (NIZORAL) 2 % cream Apply topically 2 (two) times daily 60 g 3  . naloxone  (NARCAN ) 4 mg/actuation nasal spray Place 1 spray (4 mg total) into one nostril once as needed (if not breathing or overdose is suspected.) for up  to 1 dose Give 2nd dose in 5-10 min if not responding or if sx return for up to 1 dose. 2 each 1  . nystatin  (MYCOSTATIN ) 100,000 unit/gram cream Apply topically 2 (two) times daily 60 g 3  . ondansetron  (ZOFRAN -ODT) 4 MG disintegrating tablet Take 1 tablet (4 mg total) by mouth every 8 (eight) hours as needed for Nausea or Vomiting 20 tablet 3  . pravastatin  (PRAVACHOL ) 40 MG tablet Take 1 tablet (40 mg total) by mouth at bedtime 30 tablet 11  . predniSONE  (DELTASONE ) 20 MG tablet 60 mg PO qd x 3 days then 40 mg PO qd x 3 days then 20 mg PO qd x 3 days (Patient not taking: Reported on 05/18/2024) 18 tablet 0  . semaglutide (WEGOVY) 0.25 mg/0.5 mL pen injector Inject 0.5 mLs (0.25 mg total) subcutaneously once a week for 4 doses (Patient not taking: Reported on 04/03/2024) 2 mL 0  . sertraline  (ZOLOFT ) 100 MG tablet Take 2 tablets (200 mg total) by mouth once daily for 90 days 60 tablet 2  . spironolactone (ALDACTONE) 25 MG tablet Take 0.5 tablets (12.5 mg total) by mouth once daily 15 tablet 11  . ZETIA  10 mg tablet Take 1 tablet (10 mg total) by mouth at bedtime 30 tablet 11   No current facility-administered medications for this visit.    Mental Status Examination: *Some elements like gait were unable to be assessed due to type of visit (telehealth) General Appearance: Appropriately kempt, NAD Abnormal Movements: None Psychomotor: No psychomotor agitation or retardation Level of Consciousness: Alert Psychiatric Orientation: Grossly oriented in 3 spheres Attitude: Cooperative Eye Contact: Good Speech: Normal rate, volume, and prosody; spontaneous Mood: Better Affect: Euthymic Thought Process: Linear, goal directed, future-oriented Thought Content :Denies SI, HI. No delusions elicited. No obsessions or compulsions. Perceptual Disturbances: Denies AH or VH. No illusions. Cognition: No acute deficits noted Attention: Intact Insight: Good Judgment: Good  Assessment/Treatment Plan:     This is a 68yo F w MBC and a lifelong hx of panic do presenting for ongoing med mgmt.   VISIT DIAGNOSIS: Panic disorder without agoraphobia  (primary encounter diagnosis)  GAD (generalized anxiety disorder)  PTSD (post-traumatic stress disorder)   Problem: Acute anxious distress Status: are unchanged Plan: continue sertraline  200mg  daily and alprazolam  2mg  q am, 1mg  q afternoon, 1mg  q evening, and 2mg  HS (mdd 6mg )   Problem: Bereavement Status:show no change Plan: continue supportive therapy   Problem: Increased stress Status:are worsening Plan: referred to [facility] for talk therapy  Patient will return to clinic in 2-3 months, or sooner if needed.   I spent a total of 22 minutes in both face-to-face and non-face-to-face activities for this visit on the date of this encounter.  Risk factors for safety were reviewed with Aylla H Janco and the patient is not at immediate risk for self-harm or harm to others.  The risks and benefits of psychopharmacological treatment have been reviewed with the patient in  addition to common side effects. This plan has been reviewed and agreed to by Verneita [place].  Patient Instructions:   I have reviewed this provider's instructions with the patient, answering all questions to her satisfaction.  Attestation Statement:   I personally performed the service. (TP)  MEGAN MARYELIZABETH SLIDER, MD

## 2024-05-25 NOTE — Progress Notes (Signed)
 C92 D1 of Per/Her and Faslodex  Cold spray utilized for faslodex.    Nursing Assessment Neurological- alert and oriented x4 Cardiopulmonary- no issues Genitourinary- no issues Gastrointestinal- no issues Skin- no new rashes, wounds, or areas of concern Psychosocial- no issues Nutritional- no issues Fatigue level- 0 Oral mucositis- 0 Pain score- 0  See flowsheet for falls assessment, IV assessment, and vital signs.   Access - Port PTA - Patient arrived accessed. Dressing and site clean, dry and intact. Blood return noted prior to and post infusion. Patient de-accessed per protocol  Patient has no additional therapy plan Patient has no orders for later Patient has no upcoming filgrastim shot  Patient tolerated treatment well and was discharged from treatment room.

## 2024-06-03 ENCOUNTER — Encounter (HOSPITAL_COMMUNITY): Payer: Self-pay | Admitting: Emergency Medicine

## 2024-06-03 ENCOUNTER — Inpatient Hospital Stay (HOSPITAL_COMMUNITY)
Admission: EM | Admit: 2024-06-03 | Discharge: 2024-06-06 | DRG: 189 | Disposition: A | Discharge status: Home / Self Care | Attending: Family Medicine | Admitting: Family Medicine

## 2024-06-03 ENCOUNTER — Emergency Department (HOSPITAL_COMMUNITY)

## 2024-06-03 ENCOUNTER — Other Ambulatory Visit: Payer: Self-pay

## 2024-06-03 DIAGNOSIS — Z1152 Encounter for screening for COVID-19: Secondary | ICD-10-CM

## 2024-06-03 DIAGNOSIS — Z6841 Body Mass Index (BMI) 40.0 and over, adult: Secondary | ICD-10-CM

## 2024-06-03 DIAGNOSIS — F32A Depression, unspecified: Secondary | ICD-10-CM | POA: Diagnosis present

## 2024-06-03 DIAGNOSIS — Z853 Personal history of malignant neoplasm of breast: Secondary | ICD-10-CM

## 2024-06-03 DIAGNOSIS — E66813 Obesity, class 3: Secondary | ICD-10-CM | POA: Diagnosis present

## 2024-06-03 DIAGNOSIS — Z79899 Other long term (current) drug therapy: Secondary | ICD-10-CM

## 2024-06-03 DIAGNOSIS — I10 Essential (primary) hypertension: Secondary | ICD-10-CM | POA: Diagnosis present

## 2024-06-03 DIAGNOSIS — Z96652 Presence of left artificial knee joint: Secondary | ICD-10-CM | POA: Diagnosis present

## 2024-06-03 DIAGNOSIS — Z8249 Family history of ischemic heart disease and other diseases of the circulatory system: Secondary | ICD-10-CM

## 2024-06-03 DIAGNOSIS — Z90710 Acquired absence of both cervix and uterus: Secondary | ICD-10-CM

## 2024-06-03 DIAGNOSIS — J9601 Acute respiratory failure with hypoxia: Secondary | ICD-10-CM | POA: Diagnosis not present

## 2024-06-03 DIAGNOSIS — F419 Anxiety disorder, unspecified: Secondary | ICD-10-CM | POA: Diagnosis present

## 2024-06-03 DIAGNOSIS — Z79811 Long term (current) use of aromatase inhibitors: Secondary | ICD-10-CM

## 2024-06-03 DIAGNOSIS — R051 Acute cough: Secondary | ICD-10-CM

## 2024-06-03 DIAGNOSIS — K219 Gastro-esophageal reflux disease without esophagitis: Secondary | ICD-10-CM | POA: Diagnosis present

## 2024-06-03 DIAGNOSIS — Z87891 Personal history of nicotine dependence: Secondary | ICD-10-CM

## 2024-06-03 DIAGNOSIS — E782 Mixed hyperlipidemia: Secondary | ICD-10-CM | POA: Diagnosis present

## 2024-06-03 LAB — RESPIRATORY PANEL BY PCR

## 2024-06-03 LAB — CBC WITH DIFFERENTIAL/PLATELET
Abs Immature Granulocytes: 0.03 K/uL (ref 0.00–0.07)
Basophils Absolute: 0.1 K/uL (ref 0.0–0.1)
Basophils Relative: 0 %
Eosinophils Absolute: 0.2 K/uL (ref 0.0–0.5)
Eosinophils Relative: 2 %
HCT: 39.7 % (ref 36.0–46.0)
Hemoglobin: 12.1 g/dL (ref 12.0–15.0)
Immature Granulocytes: 0 %
Lymphocytes Relative: 19 %
Lymphs Abs: 2.2 K/uL (ref 0.7–4.0)
MCH: 26.6 pg (ref 26.0–34.0)
MCHC: 30.5 g/dL (ref 30.0–36.0)
MCV: 87.3 fL (ref 80.0–100.0)
Monocytes Absolute: 0.9 K/uL (ref 0.1–1.0)
Monocytes Relative: 8 %
Neutro Abs: 8.1 K/uL — ABNORMAL HIGH (ref 1.7–7.7)
Neutrophils Relative %: 71 %
Platelets: 211 K/uL (ref 150–400)
RBC: 4.55 MIL/uL (ref 3.87–5.11)
RDW: 15.1 % (ref 11.5–15.5)
WBC: 11.4 K/uL — ABNORMAL HIGH (ref 4.0–10.5)
nRBC: 0 % (ref 0.0–0.2)

## 2024-06-03 LAB — COMPREHENSIVE METABOLIC PANEL WITH GFR
ALT: 10 U/L (ref 0–44)
AST: 16 U/L (ref 15–41)
Albumin: 3.9 g/dL (ref 3.5–5.0)
Alkaline Phosphatase: 85 U/L (ref 38–126)
Anion gap: 11 (ref 5–15)
BUN: 18 mg/dL (ref 8–23)
CO2: 26 mmol/L (ref 22–32)
Calcium: 9 mg/dL (ref 8.9–10.3)
Chloride: 101 mmol/L (ref 98–111)
Creatinine, Ser: 0.89 mg/dL (ref 0.44–1.00)
GFR, Estimated: >60 mL/min (ref >60)
Glucose, Bld: 118 mg/dL — ABNORMAL HIGH (ref 70–99)
Potassium: 3.8 mmol/L (ref 3.5–5.1)
Sodium: 138 mmol/L (ref 135–145)
Total Bilirubin: 0.5 mg/dL (ref 0.0–1.2)
Total Protein: 7.3 g/dL (ref 6.5–8.1)

## 2024-06-03 LAB — PRO BRAIN NATRIURETIC PEPTIDE: Pro Brain Natriuretic Peptide: 108 pg/mL (ref <300.0)

## 2024-06-03 LAB — LACTIC ACID, PLASMA
Lactic Acid, Venous: 1.2 mmol/L (ref 0.5–1.9)
Lactic Acid, Venous: 0.8 mmol/L (ref 0.5–1.9)

## 2024-06-03 LAB — TROPONIN T, HIGH SENSITIVITY
Troponin T High Sensitivity: <15 ng/L (ref 0–19)
Troponin T High Sensitivity: <15 ng/L (ref 0–19)

## 2024-06-03 LAB — RESP PANEL BY RT-PCR (RSV, FLU A&B, COVID)  RVPGX2
Influenza A by PCR: NEGATIVE
Influenza B by PCR: NEGATIVE
Resp Syncytial Virus by PCR: NEGATIVE
SARS Coronavirus 2 by RT PCR: NEGATIVE

## 2024-06-03 MED ORDER — ACETAMINOPHEN 325 MG PO TABS: 650.0000 mg | ORAL_TABLET | Freq: Four times a day (QID) | ORAL | Status: DC | PRN

## 2024-06-03 MED ORDER — ENOXAPARIN SODIUM 40 MG/0.4ML IJ SOSY
40.0000 mg | PREFILLED_SYRINGE | INTRAMUSCULAR | Status: DC
  Administered 2024-06-03 – 2024-06-05 (×3): 40 mg via SUBCUTANEOUS
  Filled 2024-06-03 (×3): qty 0.4

## 2024-06-03 MED ORDER — AZITHROMYCIN 250 MG PO TABS
500.0000 mg | ORAL_TABLET | Freq: Once | ORAL | Status: AC
  Administered 2024-06-03: 500 mg via ORAL
  Filled 2024-06-03: qty 2

## 2024-06-03 MED ORDER — GABAPENTIN 400 MG PO CAPS
400.0000 mg | ORAL_CAPSULE | Freq: Three times a day (TID) | ORAL | Status: DC
  Administered 2024-06-05 (×2): 400 mg via ORAL
  Filled 2024-06-03 (×7): qty 1

## 2024-06-03 MED ORDER — ONDANSETRON 4 MG PO TBDP
4.0000 mg | ORAL_TABLET | Freq: Three times a day (TID) | ORAL | Status: DC | PRN
Start: 2024-06-03 — End: 2024-06-06

## 2024-06-03 MED ORDER — SERTRALINE HCL 50 MG PO TABS
200.0000 mg | ORAL_TABLET | Freq: Every day | ORAL | Status: DC
  Administered 2024-06-03 – 2024-06-06 (×4): 200 mg via ORAL
  Filled 2024-06-03 (×4): qty 4

## 2024-06-03 MED ORDER — PRAVASTATIN SODIUM 40 MG PO TABS
40.0000 mg | ORAL_TABLET | Freq: Every day | ORAL | Status: DC
  Administered 2024-06-03 – 2024-06-05 (×3): 40 mg via ORAL
  Filled 2024-06-03 (×3): qty 1

## 2024-06-03 MED ORDER — EZETIMIBE 10 MG PO TABS
10.0000 mg | ORAL_TABLET | Freq: Every day | ORAL | Status: DC
  Administered 2024-06-03 – 2024-06-05 (×3): 10 mg via ORAL
  Filled 2024-06-03 (×3): qty 1

## 2024-06-03 MED ORDER — ALBUTEROL SULFATE (2.5 MG/3ML) 0.083% IN NEBU
2.5000 mg | INHALATION_SOLUTION | RESPIRATORY_TRACT | Status: DC | PRN
Start: 2024-06-03 — End: 2024-06-06
  Administered 2024-06-05 – 2024-06-06 (×2): 2.5 mg via RESPIRATORY_TRACT
  Filled 2024-06-03 (×2): qty 3

## 2024-06-03 MED ORDER — IPRATROPIUM-ALBUTEROL 0.5-2.5 (3) MG/3ML IN SOLN
3.0000 mL | Freq: Three times a day (TID) | RESPIRATORY_TRACT | Status: DC
  Administered 2024-06-03 (×2): 3 mL via RESPIRATORY_TRACT
  Filled 2024-06-03 (×2): qty 3

## 2024-06-03 MED ORDER — CARVEDILOL 12.5 MG PO TABS
12.5000 mg | ORAL_TABLET | Freq: Two times a day (BID) | ORAL | Status: DC
  Administered 2024-06-03 – 2024-06-06 (×5): 12.5 mg via ORAL
  Filled 2024-06-03 (×6): qty 1

## 2024-06-03 MED ORDER — IOHEXOL 350 MG/ML SOLN
75.0000 mL | Freq: Once | INTRAVENOUS | Status: AC | PRN
  Administered 2024-06-03: 75 mL via INTRAVENOUS

## 2024-06-03 MED ORDER — METHYLPREDNISOLONE SODIUM SUCC 40 MG IJ SOLR
20.0000 mg | Freq: Every day | INTRAMUSCULAR | Status: DC
  Administered 2024-06-03 – 2024-06-06 (×4): 20 mg via INTRAVENOUS
  Filled 2024-06-03 (×4): qty 1

## 2024-06-03 MED ORDER — ALPRAZOLAM 1 MG PO TABS
2.0000 mg | ORAL_TABLET | Freq: Three times a day (TID) | ORAL | Status: DC
  Administered 2024-06-03 – 2024-06-06 (×9): 2 mg via ORAL
  Filled 2024-06-03 (×9): qty 2

## 2024-06-03 MED ORDER — IPRATROPIUM-ALBUTEROL 0.5-2.5 (3) MG/3ML IN SOLN
3.0000 mL | Freq: Once | RESPIRATORY_TRACT | Status: AC
  Administered 2024-06-03: 3 mL via RESPIRATORY_TRACT
  Filled 2024-06-03: qty 3

## 2024-06-03 MED ORDER — SODIUM CHLORIDE 0.9% FLUSH
3.0000 mL | Freq: Two times a day (BID) | INTRAVENOUS | Status: DC
  Administered 2024-06-03 – 2024-06-05 (×5): 3 mL via INTRAVENOUS

## 2024-06-03 MED ORDER — ORAL CARE MOUTH RINSE
15.0000 mL | OROMUCOSAL | Status: DC | PRN
Start: 2024-06-03 — End: 2024-06-06

## 2024-06-03 MED ORDER — HYDRALAZINE HCL 20 MG/ML IJ SOLN: 5.0000 mg | INTRAMUSCULAR | Status: DC | PRN

## 2024-06-03 MED ORDER — PANTOPRAZOLE SODIUM 40 MG PO TBEC
40.0000 mg | DELAYED_RELEASE_TABLET | Freq: Every day | ORAL | Status: DC
  Administered 2024-06-03 – 2024-06-06 (×4): 40 mg via ORAL
  Filled 2024-06-03 (×4): qty 1

## 2024-06-03 MED ORDER — AZITHROMYCIN 250 MG PO TABS
250.0000 mg | ORAL_TABLET | Freq: Every day | ORAL | Status: DC
Start: 2024-06-04 — End: 2024-06-08
  Administered 2024-06-04 – 2024-06-06 (×3): 250 mg via ORAL
  Filled 2024-06-03 (×3): qty 1

## 2024-06-03 MED ORDER — ACETAMINOPHEN 650 MG RE SUPP: 650.0000 mg | Freq: Four times a day (QID) | RECTAL | Status: DC | PRN

## 2024-06-03 NOTE — ED Notes (Signed)
 Patient transported to CT

## 2024-06-03 NOTE — ED Provider Notes (Signed)
 I took over care of this patient at 7 AM.  On reevaluation patient has some mild rhonchi but no wheezing.  She is not on 4 L nasal cannula but not in any acute distress Physical Exam  BP 94/60   Pulse 73   Temp 98.5 F (36.9 C) (Oral)   Resp 20   Ht 5' 4 (1.626 m)   Wt 112 kg   SpO2 94%   BMI 42.40 kg/m   Physical Exam Vitals and nursing note reviewed.  Constitutional:      General: She is not in acute distress.    Appearance: She is well-developed.  HENT:     Head: Normocephalic and atraumatic.  Eyes:     Conjunctiva/sclera: Conjunctivae normal.  Cardiovascular:     Rate and Rhythm: Normal rate and regular rhythm.     Heart sounds: No murmur heard. Pulmonary:     Effort: Pulmonary effort is normal. No respiratory distress.     Breath sounds: Rhonchi present.  Abdominal:     Palpations: Abdomen is soft.     Tenderness: There is no abdominal tenderness.  Musculoskeletal:        General: No swelling.     Cervical back: Neck supple.  Skin:    General: Skin is warm and dry.     Capillary Refill: Capillary refill takes less than 2 seconds.  Neurological:     Mental Status: She is alert.  Psychiatric:        Mood and Affect: Mood normal.     Procedures  Procedures  ED Course / MDM   Clinical Course as of 06/03/24 1048  Sat Jun 03, 2024  0657 CBC with mild leukocytosis and neutrophilia. I personally viewed the images from radiology studies and agree with radiologist interpretation: CXR unchanged. Will proceed with CTA.   Care of the patient will be signed out to the oncoming team at shift change.  [CS]    Clinical Course User Index [CS] Roselyn Carlin NOVAK, MD   Medical Decision Making Patient signed out to me at 7 AM pending CT today for PE.  CT is negative except for some ground glass opacities.  COVID flu and RSV were ordered by me and I suspect maybe a viral etiology for her hypoxia.  She was placed on 4 L nasal cannula.  Given a breathing treatment.  Discussed  patient's case with hospitalist and patient be admitted for further workup and management.  She is agreeable with the plan for admission.  Problems Addressed: Acute cough: acute illness or injury Acute respiratory failure with hypoxia (HCC): acute illness or injury that poses a threat to life or bodily functions  Amount and/or Complexity of Data Reviewed Labs: ordered. Radiology: ordered and independent interpretation performed. Decision-making details documented in ED Course.    Details: CT PE study shows no acute abnormality Discussion of management or test interpretation with external provider(s): Dr. Bryn- hospitalist - will admit patient to his service for further workup and management   Risk OTC drugs. Prescription drug management. Drug therapy requiring intensive monitoring for toxicity. Decision regarding hospitalization.          Gennaro Bouchard L, DO 06/03/24 1048

## 2024-06-03 NOTE — ED Triage Notes (Signed)
 Pt with c/o cough x 3 days. Pt states she is currently in tx for metastatic breast cancer.

## 2024-06-03 NOTE — ED Provider Notes (Signed)
 [place] EMERGENCY DEPARTMENT AT [facility]  Provider Note  CSN: [id number] Arrival date & time: 06/03/24 9441  History Chief Complaint  Patient presents with   Cough    Claudia [name] is a 68 y.o. female with history of metastatic breast cancer, previously in remission but has recently had recurrence in lungs and in her T spine. She had a new chemotherapy regimen start 10/30 to include Herceptin, Perjeta and Faslodex. She presents this morning with 2 days of productive cough, SOB, low grade fever. She had an admission for hypoxia in June 2025 of unclear etiology, given albuterol /steroids and improved. Ultimately discharged without home oxygen.   Home Medications Prior to Admission medications   Medication Sig Start Date End Date Taking? Authorizing Provider  acetaminophen  (TYLENOL ) 500 MG tablet Take 1,000 mg by mouth every 6 (six) hours as needed for headache.    [provider]  albuterol  (VENTOLIN  HFA) 108 (90 Base) MCG/ACT inhaler Inhale 2 puffs into the lungs every 6 (six) hours as needed for wheezing or shortness of breath. 01/10/24   Pearlean Manus, MD  alprazolam  (XANAX ) 2 MG tablet Take 2 mg by mouth in the morning, at noon, and at bedtime.    [provider]  amLODipine -benazepril  (LOTREL) 10-20 MG capsule Take 1 capsule by mouth daily.    [provider]  anastrozole  (ARIMIDEX ) 1 MG tablet Take 1 tablet by mouth daily. 07/17/20   [provider]  carvedilol  (COREG ) 12.5 MG tablet Take 12.5 mg by mouth 2 (two) times daily.    [provider]  esomeprazole (NEXIUM) 40 MG capsule Take 40 mg by mouth daily. 12/23/23   [provider]  ezetimibe  (ZETIA ) 10 MG tablet Take 10 mg by mouth at bedtime.    [provider]  gabapentin  (NEURONTIN ) 400 MG capsule Take 400 mg by mouth at bedtime as needed (nerve pain, neuropathy, sleep).    [provider]  hydrocortisone  2.5 % cream Apply 1 Application  topically 2 (two) times daily.    [provider]  nystatin  (MYCOSTATIN /NYSTOP ) powder Apply 1 Application topically daily as needed (rash, infection).    [provider]  nystatin  (MYCOSTATIN /NYSTOP ) powder Apply topically 2 (two) times daily. 01/10/24   Pearlean Manus, MD  nystatin  cream (MYCOSTATIN ) Apply 1 Application topically 2 (two) times daily.    [provider]  ondansetron  (ZOFRAN -ODT) 4 MG disintegrating tablet Take 4 mg by mouth every 8 (eight) hours as needed for nausea or vomiting.    [provider]  pravastatin  (PRAVACHOL ) 40 MG tablet Take 1 tablet by mouth at bedtime. 05/25/23 05/24/24  [provider]  sertraline  (ZOLOFT ) 100 MG tablet Take 200 mg by mouth daily. 09/02/12   Jacquetta Sharlot GRADE, NP     Allergies    Statins   Review of Systems   Review of Systems Please see HPI for pertinent positives and negatives  Physical Exam BP 107/69 (BP Location: Left Arm)   Pulse 85   Temp 98.5 F (36.9 C) (Oral)   Resp 20   Ht 5' 4 (1.626 m)   Wt 112 kg   SpO2 91%   BMI 42.40 kg/m   Physical Exam Vitals and nursing note reviewed.  Constitutional:      Appearance: Normal appearance.  HENT:     Head: Normocephalic and atraumatic.     Nose: Nose normal.     Mouth/Throat:     Mouth: Mucous membranes are moist.  Eyes:  Extraocular Movements: Extraocular movements intact.     Conjunctiva/sclera: Conjunctivae normal.  Cardiovascular:     Rate and Rhythm: Normal rate.  Pulmonary:     Effort: Pulmonary effort is normal.     Breath sounds: Normal breath sounds. No wheezing, rhonchi or rales.  Abdominal:     General: Abdomen is flat.     Palpations: Abdomen is soft.     Tenderness: There is no abdominal tenderness. There is no guarding.  Musculoskeletal:        General: No swelling. Normal range of motion.     Cervical back: Neck supple.     Right lower leg: No edema.     Left lower leg: No edema.  Skin:    General:  Skin is warm and dry.  Neurological:     General: No focal deficit present.     Mental Status: She is alert.  Psychiatric:        Mood and Affect: Mood normal.     ED Results / Procedures / Treatments   EKG None  Procedures .Critical Care  Performed by: Roselyn Carlin NOVAK, MD Authorized by: Roselyn Carlin NOVAK, MD   Critical care provider statement:    Critical care time (minutes):  30   Critical care time was exclusive of:  Separately billable procedures and treating other patients   Critical care was necessary to treat or prevent imminent or life-threatening deterioration of the following conditions:  Respiratory failure   Critical care was time spent personally by me on the following activities:  Development of treatment plan with patient or surrogate, discussions with consultants, evaluation of patient's response to treatment, examination of patient, ordering and review of laboratory studies, ordering and review of radiographic studies, ordering and performing treatments and interventions, pulse oximetry, re-evaluation of patient's condition, review of old charts and obtaining history from patient or surrogate   Medications Ordered in the ED Medications - No data to display  Initial Impression and Plan  Patient here for productive cough, SOB, noted to be hypoxic down to the mid-80s on RA after walking to treatment room. Improved with rest and supplemental oxygen. She otherwise has a reassuring exam and vitals. Will check labs, CXR, anticipate she will need CTA to eval PE given hypoxia and recent chemotherapy with increased risk of VTE.   ED Course   Clinical Course as of 06/03/24 0659  Sat Jun 03, 2024  0657 CBC with mild leukocytosis and neutrophilia. I personally viewed the images from radiology studies and agree with radiologist interpretation: CXR unchanged. Will proceed with CTA.   Care of the patient will be signed out to the oncoming team at shift change.  [CS]     Clinical Course User Index [CS] Roselyn Carlin NOVAK, MD     MDM Rules/Calculators/A&P Medical Decision Making Amount and/or Complexity of Data Reviewed Labs: ordered. Radiology: ordered.     Final Clinical Impression(s) / ED Diagnoses Final diagnoses:  Acute respiratory failure with hypoxia [facility])    Rx / DC Orders ED Discharge Orders     None        Roselyn Carlin NOVAK, MD 06/03/24 [phone]

## 2024-06-03 NOTE — H&P (Signed)
 History and Physical    Patient: Claudia [name] FMW:[id number] DOB: 11/20/1955 DOA: 06/03/2024 DOS: the patient was seen and examined on 06/03/2024 PCP: Dyane Carlyon Norris, FNP  Patient coming from: Home  Chief Complaint:  Chief Complaint  Patient presents with   Cough   HPI: Claudia [name] is a 68 y.o. female with a history of metastatic right breast CA undergoing immunotherapy under care at [facility], HTN, HLD, depression, GERD who presented to the ED this morning with worsening cough, becoming productive of sputum over 3 days associated with shortness of breath and low oxygen saturations as measured at home.   She has had the increasing cough with low grade fever, without mention of sick contacts, chills. Initially dry and [name], changing her voice, making it hoarse, and over the past day was productive of clear sputum, this morning productive of thicker sputum. She's needed oxygen previously when treated for covid-19 pneumonia, but not ever at home. Her oxygen dipped into 70's at home prompting her presentation here. No chest pain.    In the ED, hypoxemia was confirmed, requiring 4L O2 to maintain saturations. Not in distress. Work up included normal troponin x2, normal pro-BNP, mild neutrophilic leukocytosis (WBC 11.4k, PMN 8.1k), negative covid, flu, RSV PCR's. CXR without focal infiltrate, and subsequent CTA chest showed no PE, no consolidation, pulmonary edema, nor pleural effusion. There were changes in the posterior mediastinum, prevertebral area suggestive of malignancy  for which oncology follow up will be continued. Admission requested for hypoxemia.    Review of Systems: As mentioned in the history of present illness. All other systems reviewed and are negative. Past Medical History:  Diagnosis Date   Anxiety    Breast cancer (HCC)    Cancer of right breast (HCC)    mastectomy   Depression    GERD (gastroesophageal reflux disease)    Hiatal hernia     Hypercholesterolemia    Hypertension    Irritable bowel    PONV (postoperative nausea and vomiting)    Past Surgical History:  Procedure Laterality Date   ABDOMINAL HYSTERECTOMY     BREAST SURGERY     lumpectomy   CHOLECYSTECTOMY     FOOT SURGERY     bilateral spur removal   HARDWARE REMOVAL Left 05/29/2014   Procedure: REMOVAL OF SCREW AND K-WIRE;  Surgeon: Lemond Stable, MD;  Location: [facility];  Service: Orthopedics;  Laterality: Left;   JOINT REPLACEMENT Left 2017   Ankle   MASTECTOMY     ORIF ANKLE FRACTURE Left 10/10/2013   Procedure: OPEN REDUCTION INTERNAL FIXATION (ORIF) ANKLE FRACTURE;  Surgeon: Lemond Stable, MD;  Location: [facility];  Service: Orthopedics;  Laterality: Left;   ORIF ANKLE FRACTURE Left 05/29/2014   Procedure: OPEN REDUCTION INTERNAL FIXATION (ORIF) ANKLE FRACTURE AND PLACEMENT OF BONE GRAFT MATERIAL;  Surgeon: Lemond Stable, MD;  Location: [facility];  Service: Orthopedics;  Laterality: Left;   TONSILLECTOMY     TOTAL ABDOMINAL HYSTERECTOMY W/ BILATERAL SALPINGOOPHORECTOMY     TOTAL KNEE ARTHROPLASTY Left 09/20/2017   Procedure: LEFT TOTAL KNEE ARTHROPLASTY;  Surgeon: Barbarann Oneil BROCKS, MD;  Location: [facility];  Service: Orthopedics;  Laterality: Left;   TUBAL LIGATION     Social History:  reports that she quit smoking about 5 years ago. Her smoking use included cigarettes. She started smoking about 45 years ago. She has a 10 pack-year smoking history. She has never used smokeless tobacco. She reports that she does not drink alcohol and does not use  drugs.  Allergies  Allergen Reactions   Statins Other (See Comments)    Muscle pain    Family History  Problem Relation Age of Onset   Liver disease Mother    Hypertension Father    Heart attack Sister     Prior to Admission medications   Medication Sig Start Date End Date Taking? Authorizing Provider  acetaminophen  (TYLENOL ) 500 MG tablet Take 1,000 mg by mouth every 6 (six) hours as needed for headache.    [provider]  albuterol  (VENTOLIN  HFA) 108 (90 Base) MCG/ACT inhaler Inhale 2 puffs into the lungs every 6 (six) hours as needed for wheezing or shortness of breath. 01/10/24   Pearlean Manus, MD  alprazolam  (XANAX ) 2 MG tablet Take 2 mg by mouth in the morning, at noon, and at bedtime.    [provider]  amLODipine -benazepril  (LOTREL) 10-20 MG capsule Take 1 capsule by mouth daily.    [provider]  anastrozole  (ARIMIDEX ) 1 MG tablet Take 1 tablet by mouth daily. 07/17/20   [provider]  carvedilol  (COREG ) 12.5 MG tablet Take 12.5 mg by mouth 2 (two) times daily.    [provider]  esomeprazole (NEXIUM) 40 MG capsule Take 40 mg by mouth daily. 12/23/23   [provider]  ezetimibe  (ZETIA ) 10 MG tablet Take 10 mg by mouth at bedtime.    [provider]  gabapentin  (NEURONTIN ) 400 MG capsule Take 400 mg by mouth at bedtime as needed (nerve pain, neuropathy, sleep).    [provider]  hydrocortisone  2.5 % cream Apply 1 Application topically 2 (two) times daily.    [provider]  nystatin  (MYCOSTATIN /NYSTOP ) powder Apply 1 Application topically daily as needed (rash, infection).    [provider]  nystatin  (MYCOSTATIN /NYSTOP ) powder Apply topically 2 (two) times daily. 01/10/24   Pearlean Manus, MD  nystatin  cream (MYCOSTATIN ) Apply 1 Application topically 2 (two) times daily.    [provider]  ondansetron  (ZOFRAN -ODT) 4 MG disintegrating tablet Take 4 mg by mouth every 8 (eight) hours as needed for nausea or vomiting.    [provider]  pravastatin  (PRAVACHOL ) 40 MG tablet Take 1 tablet by mouth at bedtime. 05/25/23 05/24/24  [provider]  sertraline  (ZOLOFT ) 100 MG tablet Take 200 mg by mouth daily. 09/02/12   Jacquetta Sharlot GRADE, NP    Physical Exam: Vitals:   06/03/24 1030 06/03/24 1124 06/03/24 1408 06/03/24 1432  BP: 107/61 124/68 129/71   Pulse: 74 84 78   Resp: 18 20  20    Temp:  97.8 F (36.6 C) 97.9 F (36.6 C)   TempSrc:  Oral Oral   SpO2: 92% 97% 96% 95%  Weight:  112.4 kg    Height:  5' 4 (1.626 m)    Gen: Pleasant 68yo F in no distress Pulm: Clear, nonlabored with supplemental oxygen  CV: RRR, no MRG or pitting edema GI: Soft, NT, ND, +BS Neuro: Alert and oriented. No new focal deficits. Ext: Warm, no deformities. Skin: No rashes, lesions or ulcers on visualized skin   Data Reviewed: WBC 11.4k, pro-BNP 108, troponin undetectable x2, LA 1.2 > 0.8, RVP negative,  CTA chest: No PE, consolidation, effusion.+ Progressive sclerosis involving the T4 vertebra. Increased soft tissue within the prevertebral posterior mediastinum, new compared to 08/04/2020 and slightly progressive compared to 01/08/2024. In the setting of a known malignancy imaging findings are suspicious for new site of disease.  Assessment and Plan: Acute hypoxic respiratory failure: Unclear  etiology, most likely is viral pneumonia/pneumonitis.  - Expand work up to include full viral panel, add sputum culture, legionella and pneumococcal Ag - Give low dose steroid (pt reports significant HTN, edema with prednisone  in the past.  - Continue scheduled duonebs and prn albuterol . Pt reports significant improvement with this and exam for me is improved from prior providers.  - Desaturation screen prior to discharge.  - Suggest PFTs after discharge/convalescence  - Given increasing production with cough, mild leukocytosis, and current immunotherapy, will Rx azithromycin .   Metastatic breast CA:  - Continue outpatient therapy through [facility] oncology, no longer on anastrozole .   HTN:  - Continue home coreg  as confirmed by disp report. Doesn't appear to be on amlodipine -benazepril  anymore, but on HCTZ. Will await formal med rec, currently normotensive.   HLD:  - Continue home statin, zetia   Depression, anxiety: Quiescent.  - Continue home SSRI, and PDMP-confirmed xanax  2mg  TID  GERD:  -  Continue PPI   Advance Care Planning: Full code  Consults: None  Family Communication: None  Severity of Illness: The appropriate patient status for this patient is OBSERVATION. Observation status is judged to be reasonable and necessary in order to provide the required intensity of service to ensure the patient's safety. The patient's presenting symptoms, physical exam findings, and initial radiographic and laboratory data in the context of their medical condition is felt to place them at decreased risk for further clinical deterioration. Furthermore, it is anticipated that the patient will be medically stable for discharge from the hospital within 2 midnights of admission.   Author: [name], MD 06/03/2024 3:35 PM  For on call review [email or web address].

## 2024-06-04 DIAGNOSIS — Z853 Personal history of malignant neoplasm of breast: Secondary | ICD-10-CM | POA: Diagnosis not present

## 2024-06-04 DIAGNOSIS — F32A Depression, unspecified: Secondary | ICD-10-CM | POA: Diagnosis present

## 2024-06-04 DIAGNOSIS — K219 Gastro-esophageal reflux disease without esophagitis: Secondary | ICD-10-CM | POA: Diagnosis present

## 2024-06-04 DIAGNOSIS — Z90710 Acquired absence of both cervix and uterus: Secondary | ICD-10-CM | POA: Diagnosis not present

## 2024-06-04 DIAGNOSIS — F419 Anxiety disorder, unspecified: Secondary | ICD-10-CM | POA: Diagnosis present

## 2024-06-04 DIAGNOSIS — Z87891 Personal history of nicotine dependence: Secondary | ICD-10-CM | POA: Diagnosis not present

## 2024-06-04 DIAGNOSIS — Z79899 Other long term (current) drug therapy: Secondary | ICD-10-CM | POA: Diagnosis not present

## 2024-06-04 DIAGNOSIS — I10 Essential (primary) hypertension: Secondary | ICD-10-CM | POA: Diagnosis present

## 2024-06-04 DIAGNOSIS — Z6841 Body Mass Index (BMI) 40.0 and over, adult: Secondary | ICD-10-CM | POA: Diagnosis not present

## 2024-06-04 DIAGNOSIS — Z8249 Family history of ischemic heart disease and other diseases of the circulatory system: Secondary | ICD-10-CM | POA: Diagnosis not present

## 2024-06-04 DIAGNOSIS — E782 Mixed hyperlipidemia: Secondary | ICD-10-CM | POA: Diagnosis present

## 2024-06-04 DIAGNOSIS — Z1152 Encounter for screening for COVID-19: Secondary | ICD-10-CM | POA: Diagnosis not present

## 2024-06-04 DIAGNOSIS — Z96652 Presence of left artificial knee joint: Secondary | ICD-10-CM | POA: Diagnosis present

## 2024-06-04 DIAGNOSIS — E66813 Obesity, class 3: Secondary | ICD-10-CM | POA: Diagnosis present

## 2024-06-04 DIAGNOSIS — J9601 Acute respiratory failure with hypoxia: Secondary | ICD-10-CM | POA: Diagnosis present

## 2024-06-04 DIAGNOSIS — Z79811 Long term (current) use of aromatase inhibitors: Secondary | ICD-10-CM | POA: Diagnosis not present

## 2024-06-04 LAB — EXPECTORATED SPUTUM ASSESSMENT W GRAM STAIN, RFLX TO RESP C

## 2024-06-04 LAB — STREP PNEUMONIAE URINARY ANTIGEN: Strep Pneumo Urinary Antigen: NEGATIVE

## 2024-06-04 MED ORDER — IPRATROPIUM-ALBUTEROL 0.5-2.5 (3) MG/3ML IN SOLN
3.0000 mL | RESPIRATORY_TRACT | Status: DC
  Filled 2024-06-04: qty 3

## 2024-06-04 MED ORDER — DM-GUAIFENESIN ER 30-600 MG PO TB12
1.0000 | ORAL_TABLET | Freq: Two times a day (BID) | ORAL | Status: DC
  Administered 2024-06-04 – 2024-06-06 (×5): 1 via ORAL
  Filled 2024-06-04 (×5): qty 1

## 2024-06-04 MED ORDER — HYDROCOD POLI-CHLORPHE POLI ER 10-8 MG/5ML PO SUER
5.0000 mL | Freq: Two times a day (BID) | ORAL | Status: DC | PRN
  Administered 2024-06-04 – 2024-06-05 (×2): 5 mL via ORAL
  Filled 2024-06-04 (×2): qty 5

## 2024-06-04 MED ORDER — IPRATROPIUM-ALBUTEROL 0.5-2.5 (3) MG/3ML IN SOLN
3.0000 mL | Freq: Three times a day (TID) | RESPIRATORY_TRACT | Status: DC
  Administered 2024-06-04 – 2024-06-06 (×8): 3 mL via RESPIRATORY_TRACT
  Filled 2024-06-04 (×7): qty 3

## 2024-06-04 NOTE — Progress Notes (Addendum)
 [facility] PROGRESS NOTE  Claudia [name] (DOB: August 08, 1955) FMW:[id number] PCP: Dyane Carlyon Norris, FNP Outpatient Specialists: [facility]  Brief Narrative: Claudia [name] is a 68 y.o. female with a history of metastatic right breast CA undergoing immunotherapy under care at [facility], HTN, HLD, depression, GERD who presented to the ED on 06/03/2024 with worsening shortness of breath, productive cough, and hypoxemia at home. In the ED, hypoxemia was confirmed, requiring 4L O2 to maintain saturations. Not in distress. Work up included normal troponin x2, normal pro-BNP, mild neutrophilic leukocytosis (WBC 11.4k, PMN 8.1k), negative covid, flu, RSV PCR's. CXR without focal infiltrate, and subsequent CTA chest showed no PE, no consolidation, pulmonary edema, nor pleural effusion. There were changes in the posterior mediastinum, prevertebral area suggestive of malignancy for which oncology follow up will be continued. Admission requested for hypoxemia.   Steroids and nebulized therapies have been continued. Azithromycin  added empirically, though work up has been largely unrevealing.   Subjective: Breathing nearly the same, cough is back to being dry and very persistent. Most bothersome symptom currently.   Objective: BP (!) 99/54   Pulse 79   Temp (!) 97.5 F (36.4 C) (Oral)   Resp 16   Ht 5' 4 (1.626 m)   Wt 112.4 kg   SpO2 94%   BMI 42.53 kg/m   Gen: No distress Pulm: End-expiratory wheeze/squeak on right anteriorly, elsewhere clear, nonlabored on supplemental oxygen but also tachypneic into mid-20's on my exam.  CV: RRR, no MRG GI: Soft, NT, ND, +BS  Neuro: Alert and oriented. No new focal deficits. Ext: Warm, no deformities. Skin: No rashes, lesions or ulcers on visualized skin   Assessment & Plan: Acute hypoxic respiratory failure: Unclear etiology, most likely is viral pneumonia/pneumonitis. Strep UAg neg. RVP, covid, RSV, flu negative.  - Monitor sputum culture and  legionella Ag's.  - Continue steroid - Continue scheduled duonebs and prn albuterol . Pt reports significant improvement with this, will continue and suggest formal PFTs after discharge.  - Confirmed to still require supplemental oxygen, which is new. Will continue inpatient management.   - Given increasing production with cough, mild leukocytosis, and current immunotherapy, will continue 5 days azithromycin .  - Add mucolytic and antitussive    Metastatic breast CA:  - Continue outpatient therapy through [facility] oncology, no longer on anastrozole .    HTN:  - Continued home coreg  as confirmed by disp report, though some soft BP and will avoid noncardioselective BB for now. [name]    HLD:  - Continue home statin, zetia    Depression, anxiety: Quiescent.  - Continue home SSRI, and PDMP-confirmed xanax  2mg  TID   GERD:  - Continue PPI  Class III obesity: Body mass index is 42.53 kg/m.    [name], MD [facility] [email or web address] 06/04/2024, 12:45 PM

## 2024-06-04 NOTE — TOC CM/SW Note (Signed)
 Transition of Care [facility]) - Inpatient Brief Assessment   Patient Details  Name: Claudia [name] MRN: [id number] Date of Birth: Nov 30, 1955  [facility] CM/SW Contact:    Noreen KATHEE Pinal, LCSWA Phone Number: 06/04/2024, 10:00 AM   Clinical Narrative:   Inpatient Care Management (ICM) has reviewed patient and no other ICM needs have been identified at this time. We will continue to monitor patient advancement through interdisciplinary progression rounds. If new patient transition needs arise, please place a ICM consult.   Transition of Care Asessment: Insurance and Status: Insurance coverage has been reviewed Patient has primary care physician: Yes Home environment has been reviewed: From Home Prior level of function:: Independent Prior/Current Home Services: No current home services Social Drivers of Health Review: SDOH reviewed no interventions necessary Readmission risk has been reviewed: Yes Transition of care needs: no transition of care needs at this time

## 2024-06-04 NOTE — Progress Notes (Signed)
 SATURATION QUALIFICATIONS:  Patient Saturations on Room Air at Rest = 92%  Patient Saturations on Room Air while Ambulating = 87%  Patient Saturations on 1 Liters of oxygen while Ambulating = 93%

## 2024-06-04 NOTE — Care Management Obs Status (Signed)
 MEDICARE OBSERVATION STATUS NOTIFICATION   Patient Details  Name: Claudia [name] MRN: [id number] Date of Birth: Apr 13, 1956   Medicare Observation Status Notification Given:  Yes    Noreen KATHEE Pinal, LCSWA 06/04/2024, 11:44 AM

## 2024-06-05 ENCOUNTER — Inpatient Hospital Stay (HOSPITAL_COMMUNITY)

## 2024-06-05 DIAGNOSIS — J9601 Acute respiratory failure with hypoxia: Secondary | ICD-10-CM | POA: Diagnosis not present

## 2024-06-05 MED ORDER — HYDROCODONE BIT-HOMATROP MBR 5-1.5 MG/5ML PO SOLN
5.0000 mL | Freq: Four times a day (QID) | ORAL | Status: DC | PRN
  Administered 2024-06-05 – 2024-06-06 (×4): 5 mL via ORAL
  Filled 2024-06-05 (×4): qty 5

## 2024-06-05 NOTE — Progress Notes (Addendum)
 06/05/2024 11:21 AM ----------------------------------------------Patient assessed by the [facility]------------------------------------   Chart reviewed:Yes   Documentation gaps: none  Labs, test, and orders reviewed: yes  30-day Readmission: No  Discharge order: No, EDD listed currently for tomorrow 06/06/2024. Per discharge schedule, pt currently scheduled for 1030 am discharge.  Current discharge plan: Home with family support. Possible home o2 needs. Primary team aware per progression rounds.   Barrier to discharge before 11am: Potential for home o2 need. Otherwise, no barriers identified at this time.   Intervention provided by [facility] team: not indicated at this time. [facility] following.   Barrier resolved: Yes   Emmons Toth, RN [employer]

## 2024-06-05 NOTE — Plan of Care (Signed)

## 2024-06-05 NOTE — Progress Notes (Signed)
 [facility] PROGRESS NOTE  Claudia [name] (DOB: 29-Jul-1955) FMW:[id number] PCP: Claudia Carlyon Norris, FNP Outpatient Specialists: [facility]  Brief Narrative: Claudia [name] is a 68 y.o. female with a history of metastatic right breast CA undergoing immunotherapy under care at [facility], HTN, HLD, depression, GERD who presented to the ED on 06/03/2024 with worsening shortness of breath, productive cough, and hypoxemia at home. In the ED, hypoxemia was confirmed, requiring 4L O2 to maintain saturations. Not in distress. Work up included normal troponin x2, normal pro-BNP, mild neutrophilic leukocytosis (WBC 11.4k, PMN 8.1k), negative covid, flu, RSV PCR's. CXR without focal infiltrate, and subsequent CTA chest showed no PE, no consolidation, pulmonary edema, nor pleural effusion. There were changes in the posterior mediastinum, prevertebral area suggestive of malignancy for which oncology follow up will be continued. Admission requested for hypoxemia.   Steroids and nebulized therapies have been continued. Azithromycin  added empirically, though work up has been largely unrevealing.   Subjective: Still persistent severe cough and trialed off oxygen this morning during breakfast, went into mid-high 80%'s with associated severe dyspnea.   Objective: BP 111/65   Pulse 90   Temp 97.9 F (36.6 C) (Oral)   Resp 18   Ht 5' 4 (1.626 m)   Wt 112.4 kg   SpO2 96%   BMI 42.53 kg/m   Gen: No distress, obese Pulm: Normal effort but tachypneic and at times pursed lip breathing, no wheezes on exam shortly after neb.   CV: RRR, no MRG GI: Soft, NT, ND, +BS  Neuro: Alert and oriented. No new focal deficits. Ext: Warm, no deformities. Skin: No rashes, lesions or ulcers on visualized skin   Assessment & Plan: Acute hypoxic respiratory failure: Unclear etiology, most likely is viral pneumonia/pneumonitis. Strep UAg neg. RVP, covid, RSV, flu negative.  - Monitor sputum culture and legionella Ag's.   - Continue steroid - Given increasing production with cough, mild leukocytosis, and current immunotherapy, will continue 5 days azithromycin .  - Added mucolytic, will augment antitussive  - Recheck CXR due to diagnostic uncertainty. Still clear lungs, but also still newly hypoxic.  - Plan to continue nebs here and arrange home nebulizer (has 34 pack-years history and improvement with nebs) continue plan for outpatient PFTs to confirm or refute Dx COPD.    Metastatic breast CA:  - Continue outpatient therapy through [facility] oncology, no longer on anastrozole .    HTN:  - Continued home coreg  as confirmed by disp report, though some soft BP and will avoid noncardioselective BB for now. Claudia [name]    HLD:  - Continue home statin, zetia    Depression, anxiety: Quiescent.  - Continue home SSRI, and PDMP-confirmed xanax  2mg  TID   GERD:  - Continue PPI  Class III obesity: Body mass index is 42.53 kg/m.    Claudia KATHEE Come, MD [facility] [email or web address] 06/05/2024, 12:07 PM

## 2024-06-06 DIAGNOSIS — J9601 Acute respiratory failure with hypoxia: Secondary | ICD-10-CM | POA: Diagnosis not present

## 2024-06-06 LAB — LEGIONELLA PNEUMOPHILA SEROGP 1 UR AG: L. pneumophila Serogp 1 Ur Ag: NEGATIVE

## 2024-06-06 LAB — CULTURE, RESPIRATORY W GRAM STAIN: Culture: NORMAL

## 2024-06-06 MED ORDER — HYDROCODONE BIT-HOMATROP MBR 5-1.5 MG/5ML PO SOLN: 5.0000 mL | Freq: Four times a day (QID) | ORAL | 0 refills | Status: AC | PRN

## 2024-06-06 MED ORDER — AZITHROMYCIN 250 MG PO TABS: 250.0000 mg | ORAL_TABLET | Freq: Every day | ORAL | 0 refills | Status: AC

## 2024-06-06 MED ORDER — IPRATROPIUM-ALBUTEROL 0.5-2.5 (3) MG/3ML IN SOLN: 3.0000 mL | Freq: Two times a day (BID) | RESPIRATORY_TRACT | Status: DC

## 2024-06-06 MED ORDER — ALBUTEROL SULFATE 0.63 MG/3ML IN NEBU: 1.0000 | INHALATION_SOLUTION | Freq: Four times a day (QID) | RESPIRATORY_TRACT | 0 refills | Status: AC | PRN

## 2024-06-06 NOTE — Plan of Care (Signed)
  Problem: Acute Rehab PT Goals(only PT should resolve) Goal: Pt Will Go Supine/Side To Sit Outcome: Progressing Flowsheets (Taken 06/06/2024 1348) Pt will go Supine/Side to Sit: Independently Goal: Patient Will Transfer Sit To/From Stand Outcome: Progressing Flowsheets (Taken 06/06/2024 1348) Patient will transfer sit to/from stand: Independently Goal: Pt Will Transfer Bed To Chair/Chair To Bed Outcome: Progressing Flowsheets (Taken 06/06/2024 1348) Pt will Transfer Bed to Chair/Chair to Bed: Independently Goal: Pt Will Ambulate Outcome: Progressing Flowsheets (Taken 06/06/2024 1348) Pt will Ambulate:  > 125 feet  with modified independence  with rolling walker Goal: Pt Will Go Up/Down Stairs Outcome: Progressing Flowsheets (Taken 06/06/2024 1348) Pt will Go Up / Down Stairs:  3-5 stairs  with rail(s)  with supervision    1:49 PM, 06/06/24 Lenita Peregrina Powell-Butler, PT, DPT [place] with [facility]

## 2024-06-06 NOTE — Progress Notes (Addendum)
 SATURATION QUALIFICATIONS: (This note is used to comply with regulatory documentation for home oxygen)  Patient Saturations on Room Air at Rest = 92%  Patient Saturations on Room Air while Ambulating = 88%  Patient Saturations on 1.5 Liters of oxygen while Ambulating = 95%  Please briefly explain why patient needs home oxygen:

## 2024-06-06 NOTE — Evaluation (Signed)
 Physical Therapy Evaluation Patient Details Name: Claudia [name] MRN: [id number] DOB: [birth year] Today's Date: 06/06/2024  History of Present Illness  Claudia [name] is a 68 y.o. female with a history of metastatic right breast CA undergoing immunotherapy under care at [facility], HTN, HLD, depression, GERD who presented to the ED this morning with worsening cough, becoming productive of sputum over 3 days associated with shortness of breath and low oxygen saturations as measured at home.      She has had the increasing cough with low grade fever, without mention of sick contacts, chills. Initially dry and hacking, changing her voice, making it hoarse, and over the past day was productive of clear sputum, this morning productive of thicker sputum. She's needed oxygen previously when treated for covid-19 pneumonia, but not ever at home. Her oxygen dipped into 70's at home prompting her presentation here. No chest pain.   Clinical Impression  Patient agreeable to PT evaluation. Reports recent baseline as being a household ambulator with RW and assisted with ADL/iADLs from children. Reports 3 falls in the last 6 months, two being when LE gave way and the other being a trip and fall. On this date, patient is modified independent with bed mobility, and CGA for transfers and ambulation with RW. Pt demonstrating mild SOB throughout. O2 stats were monitored during session. On RA at rest, SpO2 measured as 92%, with mobility it drops to 88%, and returns to 95% and above with return to 1.5 Lpm via [place] at end of session. Pt demonstrates a persistent cough throughout session and is limited due to decreased strength, decreased endurance, and impaired balance. Pt returns to bed at end of session, call button within reach, and all needs met. Patient will benefit from continued skilled physical therapy in recommended venue in order to address current deficits to improve overall function.        If plan is discharge home,  recommend the following:     Can travel by private vehicle        Equipment Recommendations None recommended by PT  Recommendations for Other Services       Functional Status Assessment Patient has had a recent decline in their functional status and demonstrates the ability to make significant improvements in function in a reasonable and predictable amount of time.     Precautions / Restrictions Precautions Precautions: Fall Recall of Precautions/Restrictions: Intact Restrictions Weight Bearing Restrictions Per Provider Order: No      Mobility  Bed Mobility Overal bed mobility: Modified Independent   General bed mobility comments: HOB elevated, no use of railings, no physical assist    Transfers Overall transfer level: Needs assistance Equipment used: Rolling walker (2 wheels) Transfers: Sit to/from Stand Sit to Stand: Contact guard assist, Supervision           General transfer comment: pt demo slow labored movement, requiring inc time, CGA for mild unsteadiness    Ambulation/Gait Ambulation/Gait assistance: Supervision, Contact guard assist Gait Distance (Feet): 100 Feet Assistive device: Rolling walker (2 wheels) Gait Pattern/deviations: Decreased step length - right, Decreased step length - left, Trunk flexed, Step-through pattern, Decreased stride length Gait velocity: decreased     General Gait Details: Pt ambulates in room and halls with RW and CGA for safety due to mild unsteadiness and history of falls, reporting LE feels weak, no LE buckling of note, overall dec speed  Careers Information Officer  Tilt Bed    Modified Rankin (Stroke Patients Only)       Balance Overall balance assessment: Needs assistance Sitting-balance support: Feet supported, No upper extremity supported Sitting balance-Leahy Scale: Good Sitting balance - Comments: Seated EOB   Standing balance support: During functional activity, Bilateral upper  extremity supported Standing balance-Leahy Scale: Fair Standing balance comment: w/ RW           Pertinent Vitals/Pain Pain Assessment Pain Assessment: 0-10 Pain Score: 3  Pain Location: BLE, knees Pain Descriptors / Indicators: Aching, Stabbing Pain Intervention(s): Limited activity within patient's tolerance, Monitored during session    Home Living Family/patient expects to be discharged to:: Private residence Living Arrangements: Children Available Help at Discharge: Family;Available PRN/intermittently Type of Home: House Home Access: Stairs to enter Entrance Stairs-Rails: Can reach both Entrance Stairs-Number of Steps: 4   Home Layout: One level Home Equipment: Agricultural Consultant (2 wheels);Shower seat - built in;BSC/3in1 Additional Comments: Pt reports no change in home set up. Reports children can physically assist her if needed    Prior Function Prior Level of Function : Needs assist       Physical Assist : Mobility (physical);ADLs (physical) Mobility (physical): Gait;Transfers ADLs (physical): Bathing;Dressing Mobility Comments: Pt reports as household ambulator and very short distances outside of home (ie. from house to car) with RW ADLs Comments: reports her children assist with ADLs/iADLs     Extremity/Trunk Assessment   Upper Extremity Assessment Upper Extremity Assessment: Generalized weakness (shoulder flexion AROM WFL, MMT 4/5)    Lower Extremity Assessment Lower Extremity Assessment: Generalized weakness (ankle DF MMT 4+/5 bilat, hip flexion 4-/5 bilaterally)    Cervical / Trunk Assessment Cervical / Trunk Assessment: Kyphotic  Communication   Communication Communication: No apparent difficulties    Cognition Arousal: Alert Behavior During Therapy: WFL for tasks assessed/performed   PT - Cognitive impairments: No apparent impairments       Following commands: Intact       Cueing Cueing Techniques: Verbal cues, Visual cues     General  Comments      Exercises     Assessment/Plan    PT Assessment All further PT needs can be met in the next venue of care  PT Problem List Decreased strength;Decreased activity tolerance;Decreased balance;Decreased mobility;Pain       PT Treatment Interventions      PT Goals (Current goals can be found in the Care Plan section)  Acute Rehab PT Goals Patient Stated Goal: Return home PT Goal Formulation: With patient Time For Goal Achievement: 06/07/24 Potential to Achieve Goals: Good    Frequency       Co-evaluation               AM-PAC PT 6 Clicks Mobility  Outcome Measure Help needed turning from your back to your side while in a flat bed without using bedrails?: None Help needed moving from lying on your back to sitting on the side of a flat bed without using bedrails?: None Help needed moving to and from a bed to a chair (including a wheelchair)?: A Little Help needed standing up from a chair using your arms (e.g., wheelchair or bedside chair)?: A Little Help needed to walk in hospital room?: A Little Help needed climbing 3-5 steps with a railing? : A Little 6 Click Score: 20    End of Session Equipment Utilized During Treatment: Gait belt Activity Tolerance: Patient tolerated treatment well;Patient limited by fatigue;Patient limited by pain Patient left: in bed;with call bell/phone  within reach   PT Visit Diagnosis: Unsteadiness on feet (R26.81);History of falling (Z91.81);Muscle weakness (generalized) (M62.81);Pain Pain - Right/Left:  (Both) Pain - part of body: Leg    Time: 9044-8982 PT Time Calculation (min) (ACUTE ONLY): 22 min   Charges:   PT Evaluation $PT Eval Low Complexity: 1 Low   PT General Charges $$ ACUTE PT VISIT: 1 Visit         1:47 PM, 06/06/24 Claudia [name], PT, DPT [place] with [facility]

## 2024-06-06 NOTE — TOC Transition Note (Signed)
 Transition of Care [facility]) - Discharge Note   Patient Details  Name: Claudia [name] MRN: [id number] Date of Birth: 1956/03/29  [facility] CM/SW Contact:  Noreen KATHEE Pinal, LCSWA Phone Number: 06/06/2024, 10:30 AM   Clinical Narrative:     Patient is DC today home with self-care. CSW spoke with patient at bedside about getting Oxygen and nebulizer machine ordered. Patient agreeable to [facility] providing O2 and neb machine. Pending SAT QUAL from nurse for [employer] to proceed with delivery of oxygen. CSW signing off.   Final next level of care: Home/Self Care Barriers to Discharge: Barriers Resolved   Patient Goals and CMS Choice Patient states their goals for this hospitalization and ongoing recovery are:: return back CMS Medicare.gov Compare Post Acute Care list provided to:: Patient Choice offered to / list presented to : Patient      Discharge Placement                Patient to be transferred to facility by: POV - family Name of family member notified: Petrice Patient and family notified of of transfer: 06/06/24  Discharge Plan and Services Additional resources added to the After Visit Summary for                  DME Arranged: Oxygen, Nebulizer machine DME Agency: [employer] Date DME Agency Contacted: 06/06/24 Time DME Agency Contacted: 1030 Representative spoke with at DME Agency: Darlyn            Social Drivers of Health (SDOH) Interventions SDOH Screenings   Food Insecurity: No Food Insecurity (06/03/2024)  Housing: Low Risk  (06/03/2024)  Transportation Needs: No Transportation Needs (06/03/2024)  Utilities: Not At Risk (06/03/2024)  Financial Resource Strain: Medium Risk (03/07/2024)   Received from [facility]  Physical Activity: Inactive (12/01/2022)   Received from [facility]  Social Connections: Socially Isolated (06/03/2024)  Stress: Stress Concern Present (12/01/2022)   Received from [facility]  Tobacco Use: Medium Risk (06/03/2024)  Health Literacy: Adequate Health Literacy (12/01/2022)   Received from [facility]     Readmission Risk Interventions    06/06/2024   10:28 AM 06/05/2024   11:58 AM  Readmission Risk Prevention Plan  Transportation Screening Complete Complete  Home Care Screening Complete Complete  Medication Review (RN CM) Complete Complete

## 2024-06-06 NOTE — Progress Notes (Signed)
 O2 has been delivered to home and portable O2 has been delivered to hospital room for transport home. Per patient her ride is on the way and bringing clothes for her to get dressed for discharge.

## 2024-06-06 NOTE — Progress Notes (Signed)
 At 1358 Patient discharged home with daughter and O2 on at 2 L Claudia [name]

## 2024-06-06 NOTE — Discharge Summary (Signed)
 Physician Discharge Summary   Patient: Claudia [name] MRN: [id number] DOB: 09-08-1955  Admit date:     06/03/2024  Discharge date: 06/06/24  Discharge Physician: [name]   PCP: Dyane Carlyon Norris, FNP   Recommendations at discharge:  Follow up with PCP in 1-2 weeks Follow up with [facility] per routine.   Discharge Diagnoses: Principal Problem:   Acute hypoxic respiratory failure (HCC) Active Problems:   Depression   Anxiety   Obesity, Class III, BMI 40-49.9 (morbid obesity) (HCC)   Essential hypertension   Mixed hyperlipidemia  Hospital Course: Claudia [name] is a 68 y.o. female with a history of metastatic right breast CA undergoing immunotherapy under care at [facility], HTN, HLD, depression, GERD who presented to the ED on 06/03/2024 with worsening shortness of breath, productive cough, and hypoxemia at home. In the ED, hypoxemia was confirmed, requiring 4L O2 to maintain saturations. Not in distress. Work up included normal troponin x2, normal pro-BNP, mild neutrophilic leukocytosis (WBC 11.4k, PMN 8.1k), negative covid, flu, RSV PCR's. CXR without focal infiltrate, and subsequent CTA chest showed no PE, no consolidation, pulmonary edema, nor pleural effusion. There were changes in the posterior mediastinum, prevertebral area suggestive of malignancy for which oncology follow up will be continued. Admission requested for hypoxemia.    Steroids and nebulized therapies have been continued. Azithromycin  added empirically, though work up has been largely unrevealing. She has improved and will be discharged with supplemental oxygen and continued therapies as below.  Assessment and Plan: Acute hypoxic respiratory failure: Unclear etiology, most likely is viral pneumonia/pneumonitis. Strep UAg neg. RVP, covid, RSV, flu negative.  - Monitor sputum culture and legionella Ag's.  - Completed steroid burst with low dose prednisone . - Given increasing production with cough, mild  leukocytosis, and current immunotherapy, will continue 5 days azithromycin  (1 more day after discharge is prescribed)  - Added mucolytic, continue OTC mucinex , also continue hycodan short course after PDMP review. - Recheck CXR was completely clear, OHS is a suspected contributor.  - Plan to continue nebs here and arrange home nebulizer (has 34 pack-years history and improvement with nebs) continue plan for outpatient PFTs to confirm or refute Dx COPD.    Metastatic breast CA:  - Continue outpatient therapy through [facility], no longer on anastrozole .    HTN:  - PCP follow up   HLD:  - Continue home statin, zetia    Depression, anxiety: Quiescent.  - Continue home SSRI, and PDMP-confirmed xanax  2mg  TID   GERD:  - Continue PPI   Class III obesity: Body mass index is 42.53 kg/m.  - Given respiratory insufficiency, would strongly consider GLP-1 agent, defer to PCP.  Consultants: None Procedures performed: None  Disposition: Home Diet recommendation: Heart healthy DISCHARGE MEDICATION: Allergies as of 06/06/2024       Reactions   Statins Other (See Comments)   Muscle pain        Medication List     STOP taking these medications    anastrozole  1 MG tablet Commonly known as: ARIMIDEX        TAKE these medications    acetaminophen  500 MG tablet Commonly known as: TYLENOL  Take 1,000 mg by mouth every 6 (six) hours as needed for headache.   albuterol  108 (90 Base) MCG/ACT inhaler Commonly known as: VENTOLIN  HFA Inhale 2 puffs into the lungs every 6 (six) hours as needed for wheezing or shortness of breath. What changed: Another medication with the same name was added. Make sure you understand  how and when to take each.   albuterol  0.63 MG/3ML nebulizer solution Commonly known as: ACCUNEB  Take 3 mLs (0.63 mg total) by nebulization every 6 (six) hours as needed for wheezing. What changed: You were already taking a medication with the same name, and this  prescription was added. Make sure you understand how and when to take each.   alprazolam  2 MG tablet Commonly known as: XANAX  Take 2 mg by mouth in the morning, at noon, and at bedtime.   amLODipine -benazepril  10-20 MG capsule Commonly known as: LOTREL Take 1 capsule by mouth daily.   azithromycin  250 MG tablet Commonly known as: ZITHROMAX  Take 1 tablet (250 mg total) by mouth daily for 1 day. Start taking on: June 07, 2024   carvedilol  12.5 MG tablet Commonly known as: COREG  Take 12.5 mg by mouth 2 (two) times daily.   esomeprazole 40 MG capsule Commonly known as: NEXIUM Take 40 mg by mouth daily.   ezetimibe  10 MG tablet Commonly known as: ZETIA  Take 10 mg by mouth at bedtime.   gabapentin  400 MG capsule Commonly known as: NEURONTIN  Take 400 mg by mouth at bedtime as needed (nerve pain, neuropathy, sleep **May take three times daily**).   hydrochlorothiazide 12.5 MG tablet Commonly known as: HYDRODIURIL Take 12.5 mg by mouth daily.   HYDROcodone  bit-homatropine 5-1.5 MG/5ML syrup Commonly known as: HYCODAN Take 5 mLs by mouth every 6 (six) hours as needed for cough.   nystatin  cream Commonly known as: MYCOSTATIN  Apply 1 Application topically 2 (two) times daily.   nystatin  powder Commonly known as: MYCOSTATIN /NYSTOP  Apply 1 Application topically daily as needed (rash, infection).   pravastatin  40 MG tablet Commonly known as: PRAVACHOL  Take 1 tablet by mouth at bedtime.   sertraline  100 MG tablet Commonly known as: ZOLOFT  Take 100 mg by mouth daily.               Durable Medical Equipment  (From admission, onward)           Start     Ordered   06/05/24 1207  For home use only DME oxygen  Once       Question Answer Comment  Length of Need 6 Months   Mode or (Route) Nasal cannula   Liters per Minute 2   Frequency Continuous (stationary and portable oxygen unit needed)   Oxygen delivery system: Gas      06/05/24 1206   06/05/24 1206   For home use only DME Nebulizer/meds  Once       Question Answer Comment  Patient needs a nebulizer to treat with the following condition COPD (chronic obstructive pulmonary disease) (HCC)   Length of Need 6 Months      06/05/24 1206            Follow-up Information     Dyane Carlyon Norris, FNP Follow up.   Specialty: Family Medicine Contact information: [address] [place] [address] [phone]                Discharge Exam: Fredricka Weights   06/03/24 0620 06/03/24 1124  Weight: 112 kg 112.4 kg  BP 123/64 (BP Location: Right Arm)   Pulse 70   Temp (!) 97.5 F (36.4 C) (Oral)   Resp 20   Ht 5' 4 (1.626 m)   Wt 112.4 kg   SpO2 95%   BMI 42.53 kg/m  Pleasant female in no distress Clear lungs, nonlabored, occasional dry cough.   Condition at discharge: stable  The results of significant diagnostics from this hospitalization (including imaging, microbiology, ancillary and laboratory) are listed below for reference.   Imaging Studies: DG Chest 2 View Result Date: 06/05/2024 CLINICAL DATA:  Shortness of breath and cough. Metastatic right breast cancer. EXAM: CHEST - 2 VIEW COMPARISON:  06/03/2024 and CT chest 06/03/2024. FINDINGS: Trachea is midline. Heart size stable. Left IJ Port-A-Cath tip is in the low SVC. Lungs are clear. No pleural fluid. Elevated right hemidiaphragm. Surgical clips in the right axilla. Sclerosis of the T4 vertebral body. IMPRESSION: 1. No acute findings. 2. T4 sclerotic lesion, better seen on CT chest 06/03/2024. Electronically Signed   By: Newell Eke M.D.   On: 06/05/2024 17:24   CT Angio Chest PE W/Cm &/Or Wo Cm Result Date: 06/03/2024 EXAM: CTA CHEST 06/03/2024 07:56:45 AM TECHNIQUE: CTA of the chest was performed after the administration of 75 mL of iohexol  (OMNIPAQUE ) 350 MG/ML injection. Multiplanar reformatted images are provided for review. MIP images are provided for review. Automated exposure control, iterative  reconstruction, and/or weight based adjustment of the mA/kV was utilized to reduce the radiation dose to as low as reasonably achievable. COMPARISON: Remote CT dated 08/04/2020 and more recent study from 01/08/2024. CLINICAL HISTORY: Pulmonary embolism (PE) suspected, high prob; cough, SOB, hypoxia, recent chemo. FINDINGS: PULMONARY ARTERIES: Pulmonary arteries are adequately opacified for evaluation. No acute pulmonary embolus. Main pulmonary artery is normal in caliber. MEDIASTINUM: Mild cardiac enlargement. Aortic atherosclerosis. Coronary artery calcifications. There is progressive increased soft tissue within the prevertebral posterior mediastinum measuring 3.0 x 1.4 cm (image 42/4). This is new when compared with remote CT dated 08/04/2020 and slightly progressive when compared with more recent study from 01/08/2024 when this measured 2.5 x 1.3 cm. On the sagittal images this extends from the inferior aspect of T1 to T5-T6 disc space measuring 7.7 cm in length. There is progressive sclerosis involving the T4 vertebra (sagittal image 83/4). LYMPH NODES: No mediastinal, hilar or axillary lymphadenopathy. LUNGS AND PLEURA: Mild mosaic attenuation pattern identified within the lungs. Subpleural fibrotic changes within the right upper lobe identified compatible with changes of external beam radiation. There is a subpleural nodule within the anterior right upper lobe measuring 9 mm (image 53/10). Unchanged from previous exam. A second, smaller 6 mm subpleural nodule is also noted within the right upper lobe (image 44/10). Also unchanged. No focal consolidation or pulmonary edema. No evidence of pleural effusion or pneumothorax. UPPER ABDOMEN: No acute abnormality within the imaged portions of the upper abdomen. SOFT TISSUES AND BONES: Signs of previous right mastectomy and right axillary node dissection. No acute bone or soft tissue abnormality. IMPRESSION: 1. No evidence of pulmonary embolism. 2. Progressive  sclerosis involving the T4 vertebra. Increased soft tissue within the prevertebral posterior mediastinum, new compared to 08/04/2020 and slightly progressive compared to 01/08/2024. In the setting of a known malignancy imaging findings are suspicious for new site of disease. Recommend referral to oncology for further management. Electronically signed by: Waddell Calk MD 06/03/2024 08:26 AM EST RP Workstation: [id number]   DG Chest Portable 1 View Result Date: 06/03/2024 EXAM: 1 VIEW(S) XRAY OF THE CHEST 06/03/2024 06:36:06 AM COMPARISON: 01/08/2024 CLINICAL HISTORY: 68 year old female with cough. FINDINGS: LINES, TUBES AND DEVICES: Left Port-A-Cath in place with tip overlying distal SVC. LUNGS AND PLEURA: Elevated right hemidiaphragm. No pleural effusion. No pneumothorax. Lung volumes and ventilation not significantly changed since 01/08/2024. Some of the asymmetric vague right lung opacity appears to be attenuation artifact from unilateral right breast  reconstruction. HEART AND MEDIASTINUM: Calcified aorta. No acute cardiopulmonary abnormality. BONES AND SOFT TISSUES: Right axillary surgical clips noted. No acute osseous abnormality. IMPRESSION: 1. No acute cardiopulmonary abnormality; stable since June and some of the Right lung asymmetry likely related to attenuation artifact from unilateral right breast reconstruction. Electronically signed by: Helayne Hurst MD 06/03/2024 06:51 AM EST RP Workstation: [id number]    Microbiology: Results for orders placed or performed during the hospital encounter of 06/03/24  Resp panel by RT-PCR (RSV, Flu A&B, Covid) Anterior Nasal Swab     Status: None   Collection Time: 06/03/24  8:52 AM   Specimen: Anterior Nasal Swab  Result Value Ref Range Status   SARS Coronavirus 2 by RT PCR NEGATIVE NEGATIVE Final    Comment: (NOTE) SARS-CoV-2 target nucleic acids are NOT DETECTED.  The SARS-CoV-2 RNA is generally detectable in upper respiratory specimens during the acute  phase of infection. The lowest concentration of SARS-CoV-2 viral copies this assay can detect is 138 copies/mL. A negative result does not preclude SARS-Cov-2 infection and should not be used as the sole basis for treatment or other patient management decisions. A negative result may occur with  improper specimen collection/handling, submission of specimen other than nasopharyngeal swab, presence of viral mutation(s) within the areas targeted by this assay, and inadequate number of viral copies(<138 copies/mL). A negative result must be combined with clinical observations, patient history, and epidemiological information. The expected result is Negative.  Fact Sheet for Patients:  bloggercourse.com  Fact Sheet for Healthcare Providers:  seriousbroker.it  This test is no t yet approved or cleared by the [place]  FDA and  has been authorized for detection and/or diagnosis of SARS-CoV-2 by FDA under an Emergency Use Authorization (EUA). This EUA will remain  in effect (meaning this test can be used) for the duration of the COVID-19 declaration under Section 564(b)(1) of the Act, 21 U.S.C.section 360bbb-3(b)(1), unless the authorization is terminated  or revoked sooner.       Influenza A by PCR NEGATIVE NEGATIVE Final   Influenza B by PCR NEGATIVE NEGATIVE Final    Comment: (NOTE) The Xpert Xpress SARS-CoV-2/FLU/RSV plus assay is intended as an aid in the diagnosis of influenza from Nasopharyngeal swab specimens and should not be used as a sole basis for treatment. Nasal washings and aspirates are unacceptable for Xpert Xpress SARS-CoV-2/FLU/RSV testing.  Fact Sheet for Patients: bloggercourse.com  Fact Sheet for Healthcare Providers: seriousbroker.it  This test is not yet approved or cleared by the [place]  FDA and has been authorized for detection and/or diagnosis of  SARS-CoV-2 by FDA under an Emergency Use Authorization (EUA). This EUA will remain in effect (meaning this test can be used) for the duration of the COVID-19 declaration under Section 564(b)(1) of the Act, 21 U.S.C. section 360bbb-3(b)(1), unless the authorization is terminated or revoked.     Resp Syncytial Virus by PCR NEGATIVE NEGATIVE Final    Comment: (NOTE) Fact Sheet for Patients: bloggercourse.com  Fact Sheet for Healthcare Providers: seriousbroker.it  This test is not yet approved or cleared by the [place]  FDA and has been authorized for detection and/or diagnosis of SARS-CoV-2 by FDA under an Emergency Use Authorization (EUA). This EUA will remain in effect (meaning this test can be used) for the duration of the COVID-19 declaration under Section 564(b)(1) of the Act, 21 U.S.C. section 360bbb-3(b)(1), unless the authorization is terminated or revoked.  Performed at [facility], [address] [place], [place] [address]   Expectorated Sputum  Assessment w Gram Stain, Rflx to Resp Cult     Status: None   Collection Time: 06/03/24 11:53 AM   Specimen: Sputum  Result Value Ref Range Status   Specimen Description SPU  Final   Special Requests NONE  Final   Sputum evaluation   Final    THIS SPECIMEN IS ACCEPTABLE FOR SPUTUM CULTURE Performed at [facility], [address], [place], [place] [address]    Report Status 06/04/2024 FINAL  Final  Culture, Respiratory w Gram Stain     Status: None (Preliminary result)   Collection Time: 06/03/24 11:53 AM   Specimen: Sputum  Result Value Ref Range Status   Specimen Description   Final    SPU Performed at [facility], [address] [place], [place] [address]    Special Requests   Final    NONE Reflexed from [id number] Performed at [facility], [address], [place], [place] [address]    Gram Stain   Final    RARE WBC SEEN FEW GRAM POSITIVE COCCI RARE GRAM NEGATIVE  RODS RARE GRAM POSITIVE RODS    Culture   Final    CULTURE REINCUBATED FOR BETTER GROWTH Performed at [facility], [address] [place], [place] [address]    Report Status PENDING  Incomplete  Respiratory (~20 pathogens) panel by PCR     Status: None   Collection Time: 06/03/24  2:24 PM   Specimen: Nasopharyngeal Swab; Respiratory  Result Value Ref Range Status   Adenovirus NOT DETECTED NOT DETECTED Final   Coronavirus 229E NOT DETECTED NOT DETECTED Final    Comment: (NOTE) The Coronavirus on the Respiratory Panel, DOES NOT test for the novel  Coronavirus (2019 nCoV)    Coronavirus HKU1 NOT DETECTED NOT DETECTED Final   Coronavirus NL63 NOT DETECTED NOT DETECTED Final   Coronavirus OC43 NOT DETECTED NOT DETECTED Final   Metapneumovirus NOT DETECTED NOT DETECTED Final   Rhinovirus / Enterovirus NOT DETECTED NOT DETECTED Final   Influenza A NOT DETECTED NOT DETECTED Final   Influenza B NOT DETECTED NOT DETECTED Final   Parainfluenza Virus 1 NOT DETECTED NOT DETECTED Final   Parainfluenza Virus 2 NOT DETECTED NOT DETECTED Final   Parainfluenza Virus 3 NOT DETECTED NOT DETECTED Final   Parainfluenza Virus 4 NOT DETECTED NOT DETECTED Final   Respiratory Syncytial Virus NOT DETECTED NOT DETECTED Final   Bordetella pertussis NOT DETECTED NOT DETECTED Final   Bordetella Parapertussis NOT DETECTED NOT DETECTED Final   Chlamydophila pneumoniae NOT DETECTED NOT DETECTED Final   Mycoplasma pneumoniae NOT DETECTED NOT DETECTED Final    Comment: Performed at [facility], [address], [place], [place] [address]    Labs: CBC: Recent Labs  Lab 06/03/24 0641  WBC 11.4*  NEUTROABS 8.1*  HGB 12.1  HCT 39.7  MCV 87.3  PLT 211   Basic Metabolic Panel: Recent Labs  Lab 06/03/24 0641  NA 138  K 3.8  CL 101  CO2 26  GLUCOSE 118*  BUN 18  CREATININE 0.89  CALCIUM 9.0   Liver Function Tests: Recent Labs  Lab 06/03/24 0641  AST 16  ALT 10  ALKPHOS 85   BILITOT 0.5  PROT 7.3  ALBUMIN 3.9   CBG: No results for input(s): GLUCAP in the last 168 hours.  Discharge time spent: greater than 30 minutes.  Signed: [name], MD [facility] 06/06/2024

## 2024-06-06 NOTE — Care Management Important Message (Signed)
 Important Message  Patient Details  Name: Claudia [name] MRN: [id number] Date of Birth: 1955-11-27   Important Message Given:  N/A - LOS <3 / Initial given by admissions     Tracee Mccreery L Skylene Deremer 06/06/2024, 11:34 AM
